# Patient Record
Sex: Female | Born: 1970 | Hispanic: No | Smoking: Former smoker
Health system: Southern US, Community
[De-identification: ages and names within clinical notes are randomized; demographics above are authoritative.]

## PROBLEM LIST (undated history)

## (undated) DIAGNOSIS — G43909 Migraine, unspecified, not intractable, without status migrainosus: Secondary | ICD-10-CM

## (undated) DIAGNOSIS — Q667 Congenital pes cavus, unspecified foot: Secondary | ICD-10-CM

## (undated) DIAGNOSIS — M25511 Pain in right shoulder: Secondary | ICD-10-CM

## (undated) DIAGNOSIS — K219 Gastro-esophageal reflux disease without esophagitis: Secondary | ICD-10-CM

## (undated) DIAGNOSIS — R87619 Unspecified abnormal cytological findings in specimens from cervix uteri: Secondary | ICD-10-CM

## (undated) DIAGNOSIS — T7840XA Allergy, unspecified, initial encounter: Secondary | ICD-10-CM

## (undated) DIAGNOSIS — J302 Other seasonal allergic rhinitis: Secondary | ICD-10-CM

## (undated) DIAGNOSIS — M722 Plantar fascial fibromatosis: Secondary | ICD-10-CM

## (undated) DIAGNOSIS — IMO0002 Reserved for concepts with insufficient information to code with codable children: Secondary | ICD-10-CM

## (undated) DIAGNOSIS — R0602 Shortness of breath: Secondary | ICD-10-CM

## (undated) HISTORY — PX: SACROSPINOUS LIGAMENT FIXATION: SHX2371

## (undated) HISTORY — PX: ABDOMINAL HYSTERECTOMY: SHX81

## (undated) HISTORY — PX: OTHER SURGICAL HISTORY: SHX169

## (undated) HISTORY — DX: Migraine, unspecified, not intractable, without status migrainosus: G43.909

## (undated) HISTORY — PX: DIAGNOSTIC LAPAROSCOPY: SUR761

## (undated) HISTORY — DX: Allergy, unspecified, initial encounter: T78.40XA

## (undated) HISTORY — DX: Unspecified abnormal cytological findings in specimens from cervix uteri: R87.619

## (undated) HISTORY — DX: Reserved for concepts with insufficient information to code with codable children: IMO0002

## (undated) HISTORY — PX: DILATION AND CURETTAGE OF UTERUS: SHX78

---
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
## 1997-11-06 HISTORY — PX: TUBAL LIGATION: SHX77

## 1998-01-02 ENCOUNTER — Inpatient Hospital Stay (HOSPITAL_COMMUNITY): Admission: AD | Admit: 1998-01-02 | Discharge: 1998-01-02 | Payer: Self-pay | Admitting: Obstetrics and Gynecology

## 1998-01-07 ENCOUNTER — Inpatient Hospital Stay (HOSPITAL_COMMUNITY): Admission: AD | Admit: 1998-01-07 | Discharge: 1998-01-07 | Payer: Self-pay | Admitting: Obstetrics and Gynecology

## 1998-01-17 ENCOUNTER — Inpatient Hospital Stay (HOSPITAL_COMMUNITY): Admission: AD | Admit: 1998-01-17 | Discharge: 1998-01-19 | Payer: Self-pay | Admitting: Obstetrics and Gynecology

## 1998-09-28 ENCOUNTER — Encounter: Admission: RE | Admit: 1998-09-28 | Discharge: 1998-09-28 | Payer: Self-pay | Admitting: Family Medicine

## 1998-11-01 ENCOUNTER — Encounter: Admission: RE | Admit: 1998-11-01 | Discharge: 1998-11-01 | Payer: Self-pay | Admitting: Family Medicine

## 1998-12-15 ENCOUNTER — Encounter: Admission: RE | Admit: 1998-12-15 | Discharge: 1998-12-15 | Payer: Self-pay | Admitting: Family Medicine

## 1999-08-17 ENCOUNTER — Encounter: Admission: RE | Admit: 1999-08-17 | Discharge: 1999-08-17 | Payer: Self-pay | Admitting: Family Medicine

## 1999-09-19 ENCOUNTER — Encounter: Admission: RE | Admit: 1999-09-19 | Discharge: 1999-09-19 | Payer: Self-pay | Admitting: Sports Medicine

## 1999-09-20 ENCOUNTER — Encounter: Admission: RE | Admit: 1999-09-20 | Discharge: 1999-09-20 | Payer: Self-pay | Admitting: Family Medicine

## 1999-09-27 ENCOUNTER — Encounter: Admission: RE | Admit: 1999-09-27 | Discharge: 1999-09-27 | Payer: Self-pay | Admitting: Sports Medicine

## 1999-10-05 ENCOUNTER — Encounter: Admission: RE | Admit: 1999-10-05 | Discharge: 1999-10-05 | Payer: Self-pay | Admitting: Family Medicine

## 1999-10-10 ENCOUNTER — Encounter: Admission: RE | Admit: 1999-10-10 | Discharge: 1999-10-10 | Payer: Self-pay | Admitting: Family Medicine

## 1999-10-26 ENCOUNTER — Encounter: Admission: RE | Admit: 1999-10-26 | Discharge: 1999-10-26 | Payer: Self-pay | Admitting: Family Medicine

## 1999-11-23 ENCOUNTER — Encounter: Admission: RE | Admit: 1999-11-23 | Discharge: 1999-11-23 | Payer: Self-pay | Admitting: Family Medicine

## 2000-02-15 ENCOUNTER — Encounter: Admission: RE | Admit: 2000-02-15 | Discharge: 2000-02-15 | Payer: Self-pay | Admitting: Family Medicine

## 2000-06-08 ENCOUNTER — Encounter: Admission: RE | Admit: 2000-06-08 | Discharge: 2000-06-08 | Payer: Self-pay | Admitting: Family Medicine

## 2000-08-06 ENCOUNTER — Encounter: Admission: RE | Admit: 2000-08-06 | Discharge: 2000-08-06 | Payer: Self-pay | Admitting: Family Medicine

## 2000-12-06 ENCOUNTER — Encounter: Admission: RE | Admit: 2000-12-06 | Discharge: 2000-12-06 | Payer: Self-pay | Admitting: Family Medicine

## 2000-12-06 ENCOUNTER — Other Ambulatory Visit: Admission: RE | Admit: 2000-12-06 | Discharge: 2000-12-06 | Payer: Self-pay | Admitting: Legal Medicine

## 2001-01-08 ENCOUNTER — Encounter: Admission: RE | Admit: 2001-01-08 | Discharge: 2001-01-08 | Payer: Self-pay | Admitting: Family Medicine

## 2001-03-19 ENCOUNTER — Encounter: Admission: RE | Admit: 2001-03-19 | Discharge: 2001-03-19 | Payer: Self-pay | Admitting: Family Medicine

## 2001-04-29 ENCOUNTER — Encounter: Admission: RE | Admit: 2001-04-29 | Discharge: 2001-04-29 | Payer: Self-pay | Admitting: Family Medicine

## 2001-05-17 ENCOUNTER — Encounter: Admission: RE | Admit: 2001-05-17 | Discharge: 2001-05-17 | Payer: Self-pay | Admitting: Family Medicine

## 2001-07-19 ENCOUNTER — Encounter: Admission: RE | Admit: 2001-07-19 | Discharge: 2001-07-19 | Payer: Self-pay | Admitting: Family Medicine

## 2001-09-02 ENCOUNTER — Encounter: Admission: RE | Admit: 2001-09-02 | Discharge: 2001-09-02 | Payer: Self-pay | Admitting: Sports Medicine

## 2001-09-16 ENCOUNTER — Encounter: Admission: RE | Admit: 2001-09-16 | Discharge: 2001-09-16 | Payer: Self-pay | Admitting: Family Medicine

## 2001-11-01 ENCOUNTER — Encounter: Admission: RE | Admit: 2001-11-01 | Discharge: 2001-11-01 | Payer: Self-pay | Admitting: Family Medicine

## 2001-12-05 ENCOUNTER — Encounter: Admission: RE | Admit: 2001-12-05 | Discharge: 2001-12-05 | Payer: Self-pay | Admitting: Sports Medicine

## 2002-02-24 ENCOUNTER — Encounter: Admission: RE | Admit: 2002-02-24 | Discharge: 2002-02-24 | Payer: Self-pay | Admitting: Family Medicine

## 2002-03-18 ENCOUNTER — Encounter: Admission: RE | Admit: 2002-03-18 | Discharge: 2002-03-18 | Payer: Self-pay | Admitting: Family Medicine

## 2002-03-18 ENCOUNTER — Other Ambulatory Visit: Admission: RE | Admit: 2002-03-18 | Discharge: 2002-03-18 | Payer: Self-pay | Admitting: Family Medicine

## 2002-03-18 ENCOUNTER — Encounter (INDEPENDENT_AMBULATORY_CARE_PROVIDER_SITE_OTHER): Payer: Self-pay | Admitting: *Deleted

## 2002-08-10 ENCOUNTER — Emergency Department (HOSPITAL_COMMUNITY): Admission: EM | Admit: 2002-08-10 | Discharge: 2002-08-10 | Payer: Self-pay | Admitting: *Deleted

## 2002-08-12 ENCOUNTER — Encounter: Admission: RE | Admit: 2002-08-12 | Discharge: 2002-08-12 | Payer: Self-pay | Admitting: Family Medicine

## 2002-08-14 ENCOUNTER — Encounter: Admission: RE | Admit: 2002-08-14 | Discharge: 2002-08-14 | Payer: Self-pay | Admitting: Family Medicine

## 2002-08-22 ENCOUNTER — Encounter: Admission: RE | Admit: 2002-08-22 | Discharge: 2002-08-22 | Payer: Self-pay | Admitting: Family Medicine

## 2002-12-30 ENCOUNTER — Encounter: Admission: RE | Admit: 2002-12-30 | Discharge: 2002-12-30 | Payer: Self-pay | Admitting: Family Medicine

## 2003-01-21 ENCOUNTER — Encounter: Admission: RE | Admit: 2003-01-21 | Discharge: 2003-01-21 | Payer: Self-pay | Admitting: Family Medicine

## 2003-03-17 ENCOUNTER — Emergency Department (HOSPITAL_COMMUNITY): Admission: EM | Admit: 2003-03-17 | Discharge: 2003-03-17 | Payer: Self-pay | Admitting: Emergency Medicine

## 2003-03-20 ENCOUNTER — Encounter: Admission: RE | Admit: 2003-03-20 | Discharge: 2003-03-20 | Payer: Self-pay | Admitting: Family Medicine

## 2003-10-08 ENCOUNTER — Encounter: Admission: RE | Admit: 2003-10-08 | Discharge: 2003-10-08 | Payer: Self-pay | Admitting: Family Medicine

## 2003-10-29 ENCOUNTER — Encounter: Admission: RE | Admit: 2003-10-29 | Discharge: 2003-10-29 | Payer: Self-pay | Admitting: Sports Medicine

## 2003-12-02 ENCOUNTER — Encounter: Admission: RE | Admit: 2003-12-02 | Discharge: 2003-12-02 | Payer: Self-pay | Admitting: Family Medicine

## 2004-01-03 ENCOUNTER — Emergency Department (HOSPITAL_COMMUNITY): Admission: EM | Admit: 2004-01-03 | Discharge: 2004-01-03 | Payer: Self-pay | Admitting: Emergency Medicine

## 2004-01-06 ENCOUNTER — Encounter: Admission: RE | Admit: 2004-01-06 | Discharge: 2004-01-06 | Payer: Self-pay | Admitting: Family Medicine

## 2004-04-06 ENCOUNTER — Encounter: Admission: RE | Admit: 2004-04-06 | Discharge: 2004-04-06 | Payer: Self-pay | Admitting: Family Medicine

## 2004-05-31 ENCOUNTER — Encounter: Admission: RE | Admit: 2004-05-31 | Discharge: 2004-05-31 | Payer: Self-pay | Admitting: Family Medicine

## 2004-06-07 ENCOUNTER — Encounter: Admission: RE | Admit: 2004-06-07 | Discharge: 2004-06-07 | Payer: Self-pay | Admitting: Sports Medicine

## 2005-01-24 ENCOUNTER — Ambulatory Visit: Payer: Self-pay | Admitting: Sports Medicine

## 2005-06-01 ENCOUNTER — Ambulatory Visit: Payer: Self-pay | Admitting: Family Medicine

## 2005-09-06 ENCOUNTER — Ambulatory Visit: Payer: Self-pay | Admitting: Family Medicine

## 2005-09-18 ENCOUNTER — Ambulatory Visit: Payer: Self-pay | Admitting: Family Medicine

## 2005-10-20 ENCOUNTER — Ambulatory Visit: Payer: Self-pay | Admitting: Family Medicine

## 2006-01-09 ENCOUNTER — Ambulatory Visit: Payer: Self-pay | Admitting: Family Medicine

## 2006-01-19 ENCOUNTER — Ambulatory Visit: Payer: Self-pay | Admitting: Sports Medicine

## 2006-05-06 ENCOUNTER — Encounter (INDEPENDENT_AMBULATORY_CARE_PROVIDER_SITE_OTHER): Payer: Self-pay | Admitting: *Deleted

## 2006-05-06 LAB — CONVERTED CEMR LAB

## 2006-05-29 ENCOUNTER — Other Ambulatory Visit: Admission: RE | Admit: 2006-05-29 | Discharge: 2006-05-29 | Payer: Self-pay | Admitting: Family Medicine

## 2006-05-29 ENCOUNTER — Ambulatory Visit: Payer: Self-pay | Admitting: Sports Medicine

## 2006-06-15 ENCOUNTER — Ambulatory Visit: Payer: Self-pay | Admitting: Family Medicine

## 2006-06-19 ENCOUNTER — Ambulatory Visit: Payer: Self-pay | Admitting: Family Medicine

## 2006-07-04 ENCOUNTER — Ambulatory Visit: Payer: Self-pay | Admitting: Family Medicine

## 2006-07-24 ENCOUNTER — Ambulatory Visit: Payer: Self-pay | Admitting: Family Medicine

## 2006-12-07 ENCOUNTER — Ambulatory Visit: Payer: Self-pay | Admitting: Family Medicine

## 2007-01-03 DIAGNOSIS — E049 Nontoxic goiter, unspecified: Secondary | ICD-10-CM | POA: Insufficient documentation

## 2007-01-03 DIAGNOSIS — J309 Allergic rhinitis, unspecified: Secondary | ICD-10-CM | POA: Insufficient documentation

## 2007-01-03 DIAGNOSIS — A630 Anogenital (venereal) warts: Secondary | ICD-10-CM

## 2007-01-03 DIAGNOSIS — F17200 Nicotine dependence, unspecified, uncomplicated: Secondary | ICD-10-CM | POA: Insufficient documentation

## 2007-01-03 DIAGNOSIS — E669 Obesity, unspecified: Secondary | ICD-10-CM

## 2007-01-03 DIAGNOSIS — F339 Major depressive disorder, recurrent, unspecified: Secondary | ICD-10-CM | POA: Insufficient documentation

## 2007-01-04 ENCOUNTER — Encounter (INDEPENDENT_AMBULATORY_CARE_PROVIDER_SITE_OTHER): Payer: Self-pay | Admitting: *Deleted

## 2007-05-07 ENCOUNTER — Telehealth: Payer: Self-pay | Admitting: *Deleted

## 2007-05-09 ENCOUNTER — Ambulatory Visit: Payer: Self-pay | Admitting: Family Medicine

## 2007-05-09 ENCOUNTER — Encounter (INDEPENDENT_AMBULATORY_CARE_PROVIDER_SITE_OTHER): Payer: Self-pay | Admitting: Family Medicine

## 2007-05-09 LAB — CONVERTED CEMR LAB: Whiff Test: POSITIVE

## 2007-05-13 ENCOUNTER — Encounter (INDEPENDENT_AMBULATORY_CARE_PROVIDER_SITE_OTHER): Payer: Self-pay | Admitting: Family Medicine

## 2007-06-03 ENCOUNTER — Telehealth: Payer: Self-pay | Admitting: *Deleted

## 2007-07-19 ENCOUNTER — Telehealth (INDEPENDENT_AMBULATORY_CARE_PROVIDER_SITE_OTHER): Payer: Self-pay | Admitting: *Deleted

## 2007-07-19 ENCOUNTER — Ambulatory Visit: Payer: Self-pay | Admitting: Family Medicine

## 2007-08-07 ENCOUNTER — Ambulatory Visit: Payer: Self-pay | Admitting: Family Medicine

## 2007-08-07 ENCOUNTER — Encounter (INDEPENDENT_AMBULATORY_CARE_PROVIDER_SITE_OTHER): Payer: Self-pay | Admitting: Family Medicine

## 2007-08-07 DIAGNOSIS — N393 Stress incontinence (female) (male): Secondary | ICD-10-CM | POA: Insufficient documentation

## 2007-08-07 LAB — CONVERTED CEMR LAB
ALT: 9 units/L (ref 0–35)
BUN: 14 mg/dL (ref 6–23)
Blood in Urine, dipstick: NEGATIVE
CO2: 23 meq/L (ref 19–32)
Calcium: 9.2 mg/dL (ref 8.4–10.5)
Chloride: 103 meq/L (ref 96–112)
Creatinine, Ser: 0.79 mg/dL (ref 0.40–1.20)
Free T4: 1.08 ng/dL (ref 0.89–1.80)
Glucose, Bld: 99 mg/dL (ref 70–99)
Nitrite: NEGATIVE
Protein, U semiquant: NEGATIVE
TSH: 2.194 microintl units/mL (ref 0.350–5.50)
Total Bilirubin: 0.4 mg/dL (ref 0.3–1.2)
Urobilinogen, UA: NEGATIVE
WBC Urine, dipstick: NEGATIVE
pH: 6

## 2007-08-08 ENCOUNTER — Encounter (INDEPENDENT_AMBULATORY_CARE_PROVIDER_SITE_OTHER): Payer: Self-pay | Admitting: Family Medicine

## 2007-08-26 ENCOUNTER — Encounter: Payer: Self-pay | Admitting: Family Medicine

## 2007-08-26 ENCOUNTER — Ambulatory Visit: Payer: Self-pay | Admitting: Sports Medicine

## 2007-08-26 LAB — CONVERTED CEMR LAB: Chlamydia, DNA Probe: NEGATIVE

## 2007-09-04 ENCOUNTER — Ambulatory Visit: Payer: Self-pay | Admitting: Family Medicine

## 2007-09-12 ENCOUNTER — Encounter (INDEPENDENT_AMBULATORY_CARE_PROVIDER_SITE_OTHER): Payer: Self-pay | Admitting: *Deleted

## 2007-09-12 ENCOUNTER — Encounter (INDEPENDENT_AMBULATORY_CARE_PROVIDER_SITE_OTHER): Payer: Self-pay | Admitting: Family Medicine

## 2007-09-12 ENCOUNTER — Ambulatory Visit: Payer: Self-pay | Admitting: Family Medicine

## 2007-09-16 ENCOUNTER — Telehealth: Payer: Self-pay | Admitting: *Deleted

## 2007-09-18 LAB — CONVERTED CEMR LAB
BUN: 19 mg/dL (ref 6–23)
Calcium: 9.1 mg/dL (ref 8.4–10.5)
Chloride: 104 meq/L (ref 96–112)
Creatinine, Ser: 0.8 mg/dL (ref 0.40–1.20)

## 2007-09-26 ENCOUNTER — Encounter (INDEPENDENT_AMBULATORY_CARE_PROVIDER_SITE_OTHER): Payer: Self-pay | Admitting: Family Medicine

## 2007-10-21 ENCOUNTER — Ambulatory Visit (HOSPITAL_BASED_OUTPATIENT_CLINIC_OR_DEPARTMENT_OTHER): Admission: RE | Admit: 2007-10-21 | Discharge: 2007-10-21 | Payer: Self-pay | Admitting: Urology

## 2007-11-01 ENCOUNTER — Emergency Department (HOSPITAL_COMMUNITY): Admission: EM | Admit: 2007-11-01 | Discharge: 2007-11-01 | Payer: Self-pay | Admitting: Family Medicine

## 2007-11-01 ENCOUNTER — Telehealth: Payer: Self-pay | Admitting: *Deleted

## 2007-11-07 HISTORY — PX: BLADDER SUSPENSION: SHX72

## 2008-01-07 ENCOUNTER — Ambulatory Visit: Payer: Self-pay | Admitting: Family Medicine

## 2008-01-10 ENCOUNTER — Telehealth: Payer: Self-pay | Admitting: Family Medicine

## 2008-01-10 ENCOUNTER — Emergency Department (HOSPITAL_COMMUNITY): Admission: EM | Admit: 2008-01-10 | Discharge: 2008-01-10 | Payer: Self-pay | Admitting: Family Medicine

## 2008-02-26 ENCOUNTER — Ambulatory Visit: Payer: Self-pay | Admitting: Family Medicine

## 2008-02-26 ENCOUNTER — Encounter (INDEPENDENT_AMBULATORY_CARE_PROVIDER_SITE_OTHER): Payer: Self-pay | Admitting: Family Medicine

## 2008-02-26 LAB — CONVERTED CEMR LAB
Basophils Relative: 0 % (ref 0–1)
Eosinophils Absolute: 0.3 10*3/uL (ref 0.0–0.7)
Lymphs Abs: 3.6 10*3/uL (ref 0.7–4.0)
MCHC: 32.9 g/dL (ref 30.0–36.0)
MCV: 90 fL (ref 78.0–100.0)
Neutro Abs: 6.9 10*3/uL (ref 1.7–7.7)
Neutrophils Relative %: 60 % (ref 43–77)
Platelets: 280 10*3/uL (ref 150–400)
TSH: 1.71 microintl units/mL (ref 0.350–5.50)
Uric Acid, Serum: 5.8 mg/dL (ref 2.4–7.0)
WBC: 11.6 10*3/uL — ABNORMAL HIGH (ref 4.0–10.5)

## 2008-03-09 ENCOUNTER — Ambulatory Visit: Payer: Self-pay | Admitting: Sports Medicine

## 2008-03-09 ENCOUNTER — Telehealth: Payer: Self-pay | Admitting: *Deleted

## 2008-03-24 ENCOUNTER — Ambulatory Visit: Payer: Self-pay | Admitting: Family Medicine

## 2008-03-28 ENCOUNTER — Emergency Department (HOSPITAL_COMMUNITY): Admission: EM | Admit: 2008-03-28 | Discharge: 2008-03-28 | Payer: Self-pay | Admitting: Family Medicine

## 2008-08-07 ENCOUNTER — Encounter: Payer: Self-pay | Admitting: Family Medicine

## 2008-08-07 ENCOUNTER — Ambulatory Visit: Payer: Self-pay | Admitting: Family Medicine

## 2008-08-12 ENCOUNTER — Encounter: Payer: Self-pay | Admitting: Family Medicine

## 2008-08-12 LAB — CONVERTED CEMR LAB: Pap Smear: NORMAL

## 2008-09-28 ENCOUNTER — Telehealth: Payer: Self-pay | Admitting: *Deleted

## 2008-09-30 ENCOUNTER — Ambulatory Visit: Payer: Self-pay | Admitting: Family Medicine

## 2009-01-11 ENCOUNTER — Ambulatory Visit: Payer: Self-pay | Admitting: Family Medicine

## 2009-01-24 ENCOUNTER — Emergency Department (HOSPITAL_COMMUNITY): Admission: EM | Admit: 2009-01-24 | Discharge: 2009-01-24 | Payer: Self-pay | Admitting: Emergency Medicine

## 2009-01-26 ENCOUNTER — Ambulatory Visit: Payer: Self-pay | Admitting: Family Medicine

## 2009-02-05 ENCOUNTER — Ambulatory Visit: Payer: Self-pay | Admitting: Family Medicine

## 2009-02-05 ENCOUNTER — Telehealth: Payer: Self-pay | Admitting: Family Medicine

## 2009-06-07 ENCOUNTER — Telehealth (INDEPENDENT_AMBULATORY_CARE_PROVIDER_SITE_OTHER): Payer: Self-pay | Admitting: *Deleted

## 2009-07-07 ENCOUNTER — Telehealth: Payer: Self-pay | Admitting: *Deleted

## 2009-07-14 ENCOUNTER — Ambulatory Visit: Payer: Self-pay | Admitting: Family Medicine

## 2009-07-15 ENCOUNTER — Telehealth: Payer: Self-pay | Admitting: Family Medicine

## 2009-07-16 ENCOUNTER — Encounter: Payer: Self-pay | Admitting: Family Medicine

## 2009-07-23 ENCOUNTER — Telehealth: Payer: Self-pay | Admitting: Family Medicine

## 2009-09-25 ENCOUNTER — Emergency Department (HOSPITAL_COMMUNITY): Admission: EM | Admit: 2009-09-25 | Discharge: 2009-09-26 | Payer: Self-pay | Admitting: Emergency Medicine

## 2009-10-06 ENCOUNTER — Encounter: Payer: Self-pay | Admitting: Family Medicine

## 2009-10-06 ENCOUNTER — Ambulatory Visit: Payer: Self-pay | Admitting: Family Medicine

## 2009-10-06 LAB — CONVERTED CEMR LAB
BUN: 17 mg/dL (ref 6–23)
Creatinine, Ser: 0.81 mg/dL (ref 0.40–1.20)
Glucose, Bld: 95 mg/dL (ref 70–99)
Hemoglobin: 13.4 g/dL (ref 12.0–15.0)
MCHC: 33.1 g/dL (ref 30.0–36.0)
MCV: 89.4 fL (ref 78.0–100.0)
RBC: 4.53 M/uL (ref 3.87–5.11)

## 2009-10-07 ENCOUNTER — Telehealth: Payer: Self-pay | Admitting: Family Medicine

## 2009-10-07 ENCOUNTER — Ambulatory Visit: Payer: Self-pay | Admitting: Family Medicine

## 2009-12-31 ENCOUNTER — Ambulatory Visit: Payer: Self-pay | Admitting: Family Medicine

## 2009-12-31 ENCOUNTER — Encounter: Payer: Self-pay | Admitting: Family Medicine

## 2009-12-31 ENCOUNTER — Telehealth: Payer: Self-pay | Admitting: Family Medicine

## 2010-01-03 ENCOUNTER — Ambulatory Visit: Payer: Self-pay | Admitting: Family Medicine

## 2010-01-03 ENCOUNTER — Encounter: Payer: Self-pay | Admitting: Family Medicine

## 2010-01-03 DIAGNOSIS — N814 Uterovaginal prolapse, unspecified: Secondary | ICD-10-CM | POA: Insufficient documentation

## 2010-02-09 ENCOUNTER — Ambulatory Visit: Payer: Self-pay | Admitting: Obstetrics & Gynecology

## 2010-04-15 ENCOUNTER — Encounter: Payer: Self-pay | Admitting: Family Medicine

## 2010-04-15 ENCOUNTER — Ambulatory Visit: Payer: Self-pay | Admitting: Family Medicine

## 2010-04-15 LAB — CONVERTED CEMR LAB
Chlamydia, DNA Probe: NEGATIVE
GC Probe Amp, Genital: NEGATIVE
Whiff Test: POSITIVE

## 2010-05-25 ENCOUNTER — Telehealth: Payer: Self-pay | Admitting: *Deleted

## 2010-08-22 ENCOUNTER — Ambulatory Visit: Payer: Self-pay | Admitting: Family Medicine

## 2010-08-22 ENCOUNTER — Encounter: Payer: Self-pay | Admitting: Family Medicine

## 2010-08-22 LAB — CONVERTED CEMR LAB
Chlamydia, DNA Probe: NEGATIVE
GC Probe Amp, Genital: NEGATIVE
Glucose, Urine, Semiquant: NEGATIVE
Nitrite: NEGATIVE
Protein, U semiquant: NEGATIVE
Specific Gravity, Urine: 1.015
WBC Urine, dipstick: NEGATIVE

## 2010-08-23 ENCOUNTER — Encounter: Payer: Self-pay | Admitting: Family Medicine

## 2010-09-06 ENCOUNTER — Encounter: Payer: Self-pay | Admitting: Family Medicine

## 2010-09-07 ENCOUNTER — Encounter: Payer: Self-pay | Admitting: *Deleted

## 2010-09-28 ENCOUNTER — Ambulatory Visit: Payer: Self-pay | Admitting: Obstetrics & Gynecology

## 2010-10-20 ENCOUNTER — Encounter: Payer: Self-pay | Admitting: Family Medicine

## 2010-10-21 ENCOUNTER — Telehealth: Payer: Self-pay | Admitting: Family Medicine

## 2010-10-23 ENCOUNTER — Encounter: Payer: Self-pay | Admitting: Family Medicine

## 2010-10-23 ENCOUNTER — Emergency Department (HOSPITAL_COMMUNITY)
Admission: EM | Admit: 2010-10-23 | Discharge: 2010-10-23 | Payer: Self-pay | Source: Home / Self Care | Admitting: Emergency Medicine

## 2010-10-24 ENCOUNTER — Telehealth: Payer: Self-pay | Admitting: Family Medicine

## 2010-10-27 ENCOUNTER — Ambulatory Visit: Payer: Self-pay

## 2010-10-27 ENCOUNTER — Ambulatory Visit: Payer: Self-pay | Admitting: Family Medicine

## 2010-10-27 DIAGNOSIS — R300 Dysuria: Secondary | ICD-10-CM

## 2010-10-27 DIAGNOSIS — F411 Generalized anxiety disorder: Secondary | ICD-10-CM

## 2010-10-27 LAB — CONVERTED CEMR LAB
Bilirubin Urine: NEGATIVE
Glucose, Urine, Semiquant: NEGATIVE
Ketones, urine, test strip: NEGATIVE
Protein, U semiquant: 100
Urobilinogen, UA: 0.2

## 2010-11-08 ENCOUNTER — Ambulatory Visit: Admit: 2010-11-08 | Payer: Self-pay

## 2010-11-17 ENCOUNTER — Encounter: Payer: Self-pay | Admitting: *Deleted

## 2010-11-17 ENCOUNTER — Encounter: Payer: Self-pay | Admitting: Sports Medicine

## 2010-11-17 ENCOUNTER — Ambulatory Visit: Admission: RE | Admit: 2010-11-17 | Discharge: 2010-11-17 | Payer: Self-pay | Source: Home / Self Care

## 2010-11-17 DIAGNOSIS — Q667 Congenital pes cavus, unspecified foot: Secondary | ICD-10-CM | POA: Insufficient documentation

## 2010-11-17 DIAGNOSIS — M722 Plantar fascial fibromatosis: Secondary | ICD-10-CM | POA: Insufficient documentation

## 2010-11-17 HISTORY — DX: Congenital pes cavus, unspecified foot: Q66.70

## 2010-11-24 ENCOUNTER — Telehealth: Payer: Self-pay | Admitting: *Deleted

## 2010-11-28 ENCOUNTER — Ambulatory Visit: Admission: RE | Admit: 2010-11-28 | Discharge: 2010-11-28 | Payer: Self-pay | Source: Home / Self Care

## 2010-12-08 NOTE — Letter (Signed)
Summary: Out of Work  [facility]  [address]   [place], [place] [address]   Phone: [phone]  Fax: [phone]    September 07, 2010   Employee:  Deanna Scott [facility] [employer] [facility]    To Whom It May Concern:   For Medical reasons, please excuse the above named employee from work for the following dates:  September 07, 2010  If you need additional information, please feel free to contact our office.         Sincerely,    Jimmy Footman, CMA for Matthew Saras, MD

## 2010-12-08 NOTE — Letter (Signed)
Summary: *Referral Letter  [facility]  [address]   [place], [place] [address]   Phone: [phone]  Fax: [phone]    01/03/2010  Thank you in advance for agreeing to see my patient:  Deanna Scott 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  smoker.   3)  stress incontinence 4)  hx of hemrrhoids 5)  obese 6)  allergies       Pertinent Labs: none   Thank you again for agreeing to see our patient; please contact us if you have any further questions or need additional information.  Sincerely,  Luretha Murphy NP

## 2010-12-08 NOTE — Letter (Signed)
Summary: Out of Work  [facility]  [address]   [place], [place] [address]   Phone: [phone]  Fax: [phone]    December 31, 2009   Employee:  DALLANA MAVITY [facility]    To Whom It May Concern:   Ms. Stinson was seen today in our office for a medical problem.  I have advised her that she is not to work from today through Monday, February 28th when she has a follow-up appointment here.    If you need additional information, please feel free to contact our office.         Sincerely,    Paula Compton MD

## 2010-12-08 NOTE — Progress Notes (Signed)
Summary: re: metronidizole/ts  Phone Note Call from Patient Call back at Home Phone [phone]   Caller: Patient Summary of Call: wants to know if she can stop taking metronadizole pls leave message at home, she cannot take calls at work Initial call taken by: De Nurse,  November 24, 2010 8:50 AM  Follow-up for Phone Call        called pt and lmvm 'do not see that metronidizole was rx'd at the ov. but, if rx'd needs to finish 7 day supply in order to get rid of signs's'. call back, if more ????' Follow-up by: Arlyss Repress CMA,,  November 24, 2010 11:22 AM

## 2010-12-08 NOTE — Consult Note (Signed)
Summary: UC visit & labs  UC visit & labs   Imported By: De Nurse 10/27/2010 16:58:40  _____________________________________________________________________  External Attachment:    Type:   Image     Comment:   External Document

## 2010-12-08 NOTE — Assessment & Plan Note (Signed)
Summary: fu infected finger/kh/ no blue team dr avail   Vital Signs:  Patient profile:   40 year old female Height:      63.75 inches Weight:      207 pounds BMI:     35.94 BSA:     1.98 Temp:     98.7 degrees F Pulse rate:   98 / minute BP sitting:   127 / 82  Vitals Entered By: Jone Baseman CMA (January 03, 2010 3:45 PM) CC: f/u infected finger Is Patient Diabetic? No Pain Assessment Patient in pain? no        Primary Care Provider:  Romero Belling MD  CC:  f/u infected finger.  History of Present Illness: Follow up after I&D of cellulitis and abcess of right middle finger.  Has been on Bactrim DS, 2 tabs two times a day for 2 weeks.  She has not been able to work.  She has kept it dry and clean.  Complains of a firm object hanging from her vaginal, she was seen and evalauated for this and had a pelvic in 09.  She is frightened.  She told her mother and her mother told her she had it the same problem and it was her uterus.  Habits & Providers  Alcohol-Tobacco-Diet     Tobacco Status: current     Tobacco Counseling: to quit use of tobacco products     Cigarette Packs/Day: 1.0  Current Medications (verified): 1)  Bactrim Ds 800-160 Mg Tabs (Sulfamethoxazole-Trimethoprim) .... Sig: Take 2 Tabs By Mouth Two Times A Day For 10 Days  Allergies (verified): No Known Drug Allergies  Review of Systems      See HPI  Physical Exam  General:  alert and well-developed.   Genitalia:  examined standing up, cervix poking through the vaginal os Skin:  right third finger with red area, non indurated, non fluctuant, small core in center with scabbing.   Impression & Recommendations:  Problem # 1:  UTERINE PROLAPSE W/O MENTION VAG WALL PROLAPSE (ICD-618.1) Refer to GYN for treatment Orders: Gynecologic Referral (Gyn) [facility]- Est Level  3 ([address]78)  Problem # 2:  UNSPECIFIED CELLULITIS AND ABSCESS OF FINGER (ICD-681.00)  improving, may begin to get wet, go back to  work. Her updated medication list for this problem includes:    Bactrim Ds 800-160 Mg Tabs (Sulfamethoxazole-trimethoprim) ..... Sig: take 2 tabs by mouth two times a day for 10 days  Orders: [facility]- Est Level  3 ([address]562)  Complete Medication List: 1)  Bactrim Ds 800-160 Mg Tabs (Sulfamethoxazole-trimethoprim) .... Sig: take 2 tabs by mouth two times a day for 10 days

## 2010-12-08 NOTE — Assessment & Plan Note (Signed)
Summary: female problem,tcb   Vital Signs:  Patient profile:   40 year old female Weight:      215 pounds Pulse rate:   79 / minute BP sitting:   132 / 82  (right arm) Cuff size:   regular  Vitals Entered By: Arlyss Repress CMA, (August 22, 2010 10:23 AM) CC: pt states 'my uterus fell'. c/o bad smelly urine and lower pelvic pain x 1 month. pt is sexually active. does not use condoms. Is Patient Diabetic? No Pain Assessment Patient in pain? no        Primary Care Provider:  Romero Belling MD  CC:  pt states 'my uterus fell'. c/o bad smelly urine and lower pelvic pain x 1 month. pt is sexually active. does not use condoms..  History of Present Illness: Female problems, mostly bad odor when voiding.  No dysuria.  Recent new sexual partner and has noticed discharge.  She is worried about her uterine prolapse, feels that it is causing pain.  She did not attend the follow up apt as directed.  She would like to have her uterus out.  Past bladder tacking by urology.  Current Medications (verified): 1)  Metronidazole 500 Mg Tabs (Metronidazole) .... One Tab Two Times A Day For 7 Days  Allergies (verified): No Known Drug Allergies  Review of Systems      See HPI General:  Denies fever. GU:  Complains of discharge; denies dysuria, urinary frequency, and urinary hesitancy; odor to urine.  Physical Exam  General:  Well-developed,well-nourished,in no acute distress; alert,appropriate and cooperative throughout examination Genitalia:  Normal introitus for age, no external lesion, grade 1-2 uterine prolapse, + wiff,mucosa pink and moist, no vaginal or cervical tag, no vaginal atrophy, no friaility or hemorrhage, normal uterus size and position, no adnexal masses or tenderness  + clues and wiff on wet prep   Impression & Recommendations:  Problem # 1:  VAGINITIS, BACTERIAL (ICD-616.10) suspect odor is from vaginal discharge and not in urine, normal UA and micro The following  medications were removed from the medication list:    Metronidazole 500 Mg Tabs (Metronidazole) .Marland KitchenMarland KitchenMarland KitchenMarland Kitchen 4 tabs times one Her updated medication list for this problem includes:    Metronidazole 500 Mg Tabs (Metronidazole) ..... One tab two times a day for 7 days  Orders: [facility]- Est Level  3 (81191)  Problem # 2:  UTERINE PROLAPSE W/O MENTION VAG WALL PROLAPSE (ICD-618.1) Recommneded returing for follow up with GYN clinic to discuss options, recommended [place] exercises often.  Complete Medication List: 1)  Metronidazole 500 Mg Tabs (Metronidazole) .... One tab two times a day for 7 days  Other Orders: Urinalysis-[facility] (00000) GC/Chlamydia-[facility] (87591/87491) [phone] Prescriptions: METRONIDAZOLE 500 MG TABS (METRONIDAZOLE) one tab two times a day for 7 days  #14 x 0   Entered and Authorized by:   Luretha Murphy NP   Signed by:   Luretha Murphy NP on 08/22/2010   Method used:   Print then Give to Patient   RxID:   [id number]    Orders Added: 1)  Urinalysis-[facility] [00000] 2)  GC/Chlamydia-[facility] [87591/87491] 3)  Wet Prep- [facility] [95284] 4)  [facility]- Est Level  3 [13244]    Laboratory Results   Urine Tests  Date/Time Received: August 22, 2010 10:29 AM  Date/Time Reported: August 22, 2010 10:44 AM   Routine Urinalysis   Color: yellow Appearance: Clear Glucose: negative   (Normal Range: Negative) Bilirubin: negative   (Normal Range: Negative) Ketone: negative   (  Normal Range: Negative) Spec. Gravity: 1.015   (Normal Range: 1.003-1.035) Blood: moderate   (Normal Range: Negative) pH: 5.5   (Normal Range: 5.0-8.0) Protein: negative   (Normal Range: Negative) Urobilinogen: 0.2   (Normal Range: 0-1) Nitrite: negative   (Normal Range: Negative) Leukocyte Esterace: negative   (Normal Range: Negative)  Urine Microscopic WBC/HPF: occ RBC/HPF: 0-3 Bacteria/HPF: 3+ Epithelial/HPF: 1-5    Comments: ...........test performed by...........Marland KitchenTerese Door, CMA   Date/Time  Received: August 22, 2010 11:18 AM  Date/Time Reported: August 22, 2010 11:23 AM   [place] Source: vaginal WBC/hpf: rare Bacteria/hpf: 3+  Cocci Clue cells/hpf: few  Positive whiff Yeast/hpf: none Trichomonas/hpf: none Comments: ...........test performed by...........Marland KitchenTerese Door, CMA

## 2010-12-08 NOTE — Assessment & Plan Note (Signed)
Summary: finger swollen,red w/pus/[place]   Vital Signs:  Patient profile:   40 year old female Weight:      210.6 pounds BMI:     36.57 Temp:     97.5 degrees F Pulse rate:   89 / minute BP sitting:   130 / 87  Vitals Entered By: Starleen Blue RN (December 31, 2009 3:58 PM) CC: finger red swollen Is Patient Diabetic? No Pain Assessment Patient in pain? no        Primary Care Provider:  Romero Belling MD  CC:  finger red swollen.  History of Present Illness: Patient here for complaint of Right middle finger soreness and redness that began yesterday evening.  Has extended since then, has begun to exude a small amount of pus.  Patientworks in food preparation, also has hands in dishwater a lot.   No recollection of trauma or insect bite.  Never has had this before.   Denies history of fevers or chills.   Habits & Providers  Alcohol-Tobacco-Diet     Tobacco Status: current     Cigarette Packs/Day: 1.0  Current Medications (verified): 1)  Bactrim Ds 800-160 Mg Tabs (Sulfamethoxazole-Trimethoprim) .... Sig: Take 2 Tabs By Mouth Two Times A Day For 10 Days  Allergies (verified): No Known Drug Allergies  Physical Exam  General:  well appearing, no apparent distress.  Msk:  RIGHT hand with circular area of erythema wiht central fluctuance with purulence, measurement of affected area is 1.5cm squared, along dorsum of proximal phalanx of RIGHT hand.  Additional Exam:  Procedure Note;  After discussing risks and benefits of I&D and providing opportunity to ask questions, written consent obtained from patient.  Sterile prep performed, and 1% lidocaine without epinephrine infused to raise a wheal.  After adequate anesthesia assured, a 0.5cm straight incision performed in center of erythematous area, with expression of small amt of purulence. Area cleaned, without loculations noted. Triple antibiotic applied, dressed. Tolerated well.    Impression & Recommendations:  Problem #  1:  UNSPECIFIED CELLULITIS AND ABSCESS OF FINGER (ICD-681.00)  RIGHT middle finger abscess and cellulitis, without suspicion of deep/bony involvement by exam.  I&D performed today, also started on BACTRIM for suspected CA-MRSA.  Wound cultures not indicated.  For follow up on MOnday. 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  THerefore, note out of work until she can have follow up on MOnday.  She is agreeable to this.  Her updated medication list for this problem includes:    Bactrim Ds 800-160 Mg Tabs (Sulfamethoxazole-trimethoprim) ..... Sig: take 2 tabs by mouth two times a day for 10 days  Orders: [facility]- Est Level  3 ([address]109) I&D Abcess, simple- [facility] (10060)  Complete Medication List: 1)  Bactrim Ds 800-160 Mg Tabs (Sulfamethoxazole-trimethoprim) .... Sig: take 2 tabs by mouth two times a day for 10 days  Patient Instructions: 1)  It was a pleasure to see you today.  I drained the abscess on your right middle finger today.   2)  I sent a prescription for Bactrim DS tablets to [facility] [address]  Take 2 tablets by mouth two times a day for 10 days.  3)  I WANT YOU TO BE SEEN IN THIS OFFICE ON MONDAY FEB 28th AT ANY TIME THAT WORKS FOR YOU>  PLEASE MAKE AN APPOINTMENT WITH THE FRONT DESK TODAY. Prescriptions: BACTRIM DS 800-160 MG TABS (SULFAMETHOXAZOLE-TRIMETHOPRIM) SIG: Take 2 tabs by mouth two times a day for 10 days  #  40 x 0   Entered and Authorized by:   Paula Compton MD   Signed by:   Paula Compton MD on 12/31/2009   Method used:   Electronically to        [facility]. [phone]* (retail)       [address]       [place], [place]  [address]       Ph: [phone]       Fax: [phone]   RxID:   [id number]

## 2010-12-08 NOTE — Assessment & Plan Note (Addendum)
Summary: foot prob,df   Vital Signs:  Patient profile:   40 year old female Height:      63.75 inches Weight:      220 pounds Temp:     98 degrees F oral Pulse rate:   100 / minute BP sitting:   124 / 84  (left arm) Cuff size:   large  Vitals Entered By: Loralee Pacas CMA (November 17, 2010 3:49 PM) CC: bilateral foot pain Is Patient Diabetic? No Pain Assessment Patient in pain? yes     Location: foot Intensity: 9 Type: heaviness Onset of pain  Constant Comments pt states that both of her arches have fallen and she was given some orthotics to use in her shoes and the pain has not gotten any better. she has a job the requires alot of standing and walking. pt would like a podiatry referral   Primary Care Provider:  Luretha Murphy NP  CC:  bilateral foot pain.  History of Present Illness: 40 yo female with bilateral foot pain.  Pt notes pain is worse in the morning, often with the first step of the day.  Located just at the front of the plantar aspect of the calcaneus.  She has had heel cups rx'ed in the past but did not wear them.  She uses only ibuprofen 400mg  without any improvement.  She bought some OTC orthotics that do not appear to offer any kind of support.  Pain radiates from calcaneus to metatarsal heads.  She is quite miserable as she stands all day at work.  R foot much worse than the left.  Habits & Providers  Alcohol-Tobacco-Diet     Tobacco Status: current     Tobacco Counseling: to quit use of tobacco products     Cigarette Packs/Day: 1.0     Year Started: 1984     Pack years: 37.50  Current Medications (verified): 1)  Metronidazole 500 Mg Tabs (Metronidazole) .... One Tab Two Times A Day For 7 Days 2)  Famotidine 40 Mg Tabs (Famotidine) .... One Tab Two Times A Day As Needed Hives 3)  Loratadine Allergy Relief 10 Mg Tbdp (Loratadine) .... One Daily 4)  Prednisone 20 Mg Tabs (Prednisone) .... One Daily For 5 Day 5)  Azithromycin 250 Mg Tabs (Azithromycin)  .... 2 Tabs By Mouth Daily For The Next 3 Days 6)  Hydroxyzine Hcl 25 Mg Tabs (Hydroxyzine Hcl) .Marland Kitchen.. 1 Tab By Mouth Three Times A Day As Needed For Hives, Itching or Anxiety 7)  Mobic 15 Mg Tabs (Meloxicam) .... One Tab By Mouth Daily For Pain  Allergies (verified): No Known Drug Allergies  Review of Systems       See HPI  Physical Exam  General:  Well-developed,well-nourished,in no acute distress; alert,appropriate and cooperative throughout examination Msk:  She has bilateral pes cavus with extremely high longitudinal arches.  Her transverse arches are somewhat deteriorated with clawing of her toes.  No abnormal callus. She is very TTP at the origin of the plantar fascia b/l. ROM is full with neg kleiger and ant drawer tests. Strength is 5/5 to all movements. No hallux rigidus.   Additional Exam:  MSK [place] with moderate thickening of the plantar fascia on the R foot, no increased doppler signal. Image saved.   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 Heel cups rxed. Pt declines cortisone injection of the plantar fascia today. Mobic for pain. SM advisor  rehab exercises given. RTC if no better in 2 weeks.  Her updated medication list for this problem includes:    Mobic 15 Mg Tabs (Meloxicam) ..... One tab by mouth daily for pain  Orders: [facility]- Est  Level 4 (99214) [place] LIMITED (13086) Heel Cushions-[facility] (V7846)  Problem # 2:  TALIPES CAVUS (ICD-754.71) Assessment: New Present, did not assess gait today but do not think this is contributing to her symptoms.  Complete Medication List: 1)  Metronidazole 500 Mg Tabs (Metronidazole) .... One tab two times a day for 7 days 2)  Famotidine 40 Mg Tabs (Famotidine) .... One tab two times a day as needed hives 3)  Loratadine Allergy Relief 10 Mg Tbdp (Loratadine) .... One daily 4)  Prednisone 20 Mg Tabs (Prednisone) ....  One daily for 5 day 5)  Azithromycin 250 Mg Tabs (Azithromycin) .... 2 tabs by mouth daily for the next 3 days 6)  Hydroxyzine Hcl 25 Mg Tabs (Hydroxyzine hcl) .Marland Kitchen.. 1 tab by mouth three times a day as needed for hives, itching or anxiety 7)  Mobic 15 Mg Tabs (Meloxicam) .... One tab by mouth daily for pain Prescriptions: MOBIC 15 MG TABS (MELOXICAM) One tab by mouth daily for pain  #30 x 3   Entered and Authorized by:   Rodney Langton MD   Signed by:   Rodney Langton MD on 11/17/2010   Method used:   Print then Give to Patient   RxID:   [id number]    Orders Added: 1)  [facility]- Est  Level 4 [27253] 2)  [place] LIMITED [66440] 3)  Heel Cushions-[facility] [L3332]

## 2010-12-08 NOTE — Progress Notes (Signed)
Summary: phn msg  Phone Note Call from Patient Call back at [facility] Phone [phone]   Caller: Patient Summary of Call: was taking allergy meds and prednisone for her hives- went to [facility] care yesterday and they increased her prednisone and feels much better.  wants to know if she can stop taking allergy meds as they are messing with her BP. Initial call taken by: De Nurse,  October 24, 2010 8:33 AM  Follow-up for Phone Call        Ms. Mcneil is still wating for phone call regarding her concern about her meds and a rx called in for her daughter  Jeris Penta Follow-up by: Abundio Miu,  October 24, 2010 10:42 AM  Additional Follow-up for Phone Call Additional follow up Details #1::        will fwd. to s.saxon to address. Additional Follow-up by: Arlyss Repress CMA,,  October 24, 2010 12:08 PM    Additional Follow-up for Phone Call Additional follow up Details #2::    sure Follow-up by: Luretha Murphy NP,  October 24, 2010 1:40 PM

## 2010-12-08 NOTE — Progress Notes (Signed)
Summary: triage  Phone Note Call from Patient Call back at [phone]   Caller: Patient Summary of Call: Pt has something on finger that maybe a bite finger swollen now moving into hand.  Hand is red and looks like yellow stuff inside of area. Initial call taken by: Clydell Hakim,  December 31, 2009 10:03 AM  Follow-up for Phone Call        she noticed it yesterday. does not know if she was bitten. unable to come until after work. appt at 4 with Dr. Mauricio Po Follow-up by: Golden Circle RN,  December 31, 2009 10:30 AM

## 2010-12-08 NOTE — Progress Notes (Signed)
Summary: phn msg  Phone Note Call from Patient Call back at Home Phone [phone]   Caller: Patient Summary of Call: still breaking out and wants to know what to do. Initial call taken by: De Nurse,  October 21, 2010 8:43 AM  Follow-up for Phone Call        Was in clinic yesteday, seen by Luretha Murphy who wrote her a script.  Darl Pikes is in clinic this am so will route note to her. Follow-up by: Dennison Nancy RN,  October 21, 2010 8:52 AM  Additional Follow-up for Phone Call Additional follow up Details #1::        Will add short course of prednisone for the urticaria, she should continue the H1 and H2 blocker. She is getting congested may be related to viral reaction. Additional Follow-up by: Luretha Murphy NP,  October 21, 2010 9:07 AM    New/Updated Medications: PREDNISONE 20 MG TABS (PREDNISONE) one daily for 5 day Prescriptions: PREDNISONE 20 MG TABS (PREDNISONE) one daily for 5 day  #5 x 0   Entered and Authorized by:   Luretha Murphy NP   Signed by:   Luretha Murphy NP on 10/21/2010   Method used:   Electronically to        [facility]. [phone]* (retail)       [address]       [place], [place]  [address]       Ph: [phone]       Fax: [phone]   RxID:   [id number]

## 2010-12-08 NOTE — Assessment & Plan Note (Signed)
Summary: f/u UC,df   Vital Signs:  Patient profile:   40 year old female Weight:      215 pounds Temp:     97.5 degrees F oral Pulse rate:   67 / minute Pulse rhythm:   regular BP sitting:   142 / 98  (left arm)  Vitals Entered By: Loralee Pacas CMA (October 27, 2010 12:09 PM) Comments sob, lightheaded, weak   Primary Care Provider:  Romero Belling MD   History of Present Illness: 40 yo female who was seen by [facility] for hives and urticaria, pt was on prednisone at the time had increase dose and it helped.  Pt though now states she is having problems breathing for the last 2 days and has stopped all medications. Pt denies fever, chills, nausea, vomiting, diarrhea or constipation but is not feeling right.  Pt daughter accompanies her and states had walking pna last year and it started the same way.  pt does smoke.  pt denies much of a cough and not productive. Pt daughter also states pt is a very anxious person and seems wound very tight but denies any depressive symptoms. Lots of stress during the holidays.   Current Medications (verified): 1)  Metronidazole 500 Mg Tabs (Metronidazole) .... One Tab Two Times A Day For 7 Days 2)  Famotidine 40 Mg Tabs (Famotidine) .... One Tab Two Times A Day As Needed Hives 3)  Loratadine Allergy Relief 10 Mg Tbdp (Loratadine) .... One Daily 4)  Prednisone 20 Mg Tabs (Prednisone) .... One Daily For 5 Day 5)  Azithromycin 250 Mg Tabs (Azithromycin) .... 2 Tabs By Mouth Daily For The Next 3 Days 6)  Hydroxyzine Hcl 25 Mg Tabs (Hydroxyzine Hcl) .Marland Kitchen.. 1 Tab By Mouth Three Times A Day As Needed For Hives, Itching or Anxiety  Allergies (verified): No Known Drug Allergies  Past History:  Past medical, surgical, family and social histories (including risk factors) reviewed, and no changes noted (except as noted below).  Past Medical History: Reviewed history from 02/26/2008 and no changes required. ANA (-), RF (-), ACE (-), RAST (-) 08/2002, h/o abnormal  pap  1 ppd.  smoker.   stress incontinence hx of hemrrhoids obese allergies    Past Surgical History: Reviewed history from 02/26/2008 and no changes required. 5/95 cervical biopsy:  slight dysplasia -,  exploratory laparoscopy 1990 -,  Tubal ligation 1999 -   PMH-FH-SH reviewed for relevance  Family History: Reviewed history from 02/26/2008 and no changes required. father with alcoholsim- died when pt was 1.  sister and mother have HTN,  grandmother maternal- DM, HTN     Social History: Reviewed history from 02/26/2008 and no changes required. two children, works at [employer].; smokes one ppd, drinks etoh, occasionally.      Review of Systems       see hpi  Physical Exam  General:  Well-developed,well-nourished,in no acute distress; alert,appropriate and cooperative throughout examination Eyes:  EOMI, PERRLA Nose:  erythema turbinates  Mouth:  mild PND mild erythema no enlarge gland Lungs:  coarse breath sounds no weezing or focal findings good air movement overall.  Heart:  Normal rate and regular rhythm. S1 and S2 normal without gallop, murmur, click, rub or other extra sounds. Abdomen:   BS, overweight.  Pulses:  2+ Extremities:  no true rash lots of exfoliation from self itching.  Neurologic:  alert & oriented X3 and gait normal.     Impression & Recommendations:  Problem # 1:  ACUTE BRONCHITIS (ICD-466.0)  Assessment New Pt does have some coarse breath sounds and pt is very concern could be early pna and has finding of possible early sinus infection.  Will treat with azithro for 3 days and see how pt does do feel pt does have anxiety component to problems so will give hydroxizine as needed as well to see how pt does.  Told pt to stop other antihistamines if taking it normal.  Her updated medication list for this problem includes:    Metronidazole 500 Mg Tabs (Metronidazole) ..... One tab two times a day for 7 days    Azithromycin 250 Mg Tabs (Azithromycin) .Marland Kitchen... 2  tabs by mouth daily for the next 3 days  Orders: [facility]- Est  Level 4 (82956)  Problem # 2:  DEPRESSION, MAJOR, RECURRENT (ICD-296.30) no depression at this time should do screening at next visit.  Problem # 3:  ANXIETY DISORDER, GENERALIZED (ICD-300.02)  Her updated medication list for this problem includes:    Hydroxyzine Hcl 25 Mg Tabs (Hydroxyzine hcl) .Marland Kitchen... 1 tab by mouth three times a day as needed for hives, itching or anxiety  Complete Medication List: 1)  Metronidazole 500 Mg Tabs (Metronidazole) .... One tab two times a day for 7 days 2)  Famotidine 40 Mg Tabs (Famotidine) .... One tab two times a day as needed hives 3)  Loratadine Allergy Relief 10 Mg Tbdp (Loratadine) .... One daily 4)  Prednisone 20 Mg Tabs (Prednisone) .... One daily for 5 day 5)  Azithromycin 250 Mg Tabs (Azithromycin) .... 2 tabs by mouth daily for the next 3 days 6)  Hydroxyzine Hcl 25 Mg Tabs (Hydroxyzine hcl) .Marland Kitchen.. 1 tab by mouth three times a day as needed for hives, itching or anxiety  Other Orders: Urinalysis-[facility] (00000)  Patient Instructions: 1)  I am giving you two new medications 2)  one called azithromycin, a antibitoic, you will take 1 pill daily for the next three days bbut this medicine will work for 2 weeks 3)  I will give you another medication called hydroxizine that will help you with any allerigc reaction such as your hives.  this medicine may make you a little sleepy so take it firs when you are at home.  You can take this medicine up to three times a day  4)  I want you to come back next week and follow up with Darl Pikes. 5)  Happy holidays.  Prescriptions: HYDROXYZINE HCL 25 MG TABS (HYDROXYZINE HCL) 1 tab by mouth three times a day as needed for hives, itching or anxiety  #30 x 1   Entered and Authorized by:   Antoine Primas DO   Signed by:   Terese Door on 10/27/2010   Method used:   Electronically to        [facility]. [phone]* (retail)       [address]        [place], [place]  [address]       Ph: [phone]       Fax: [phone]   RxID:   [id number] AZITHROMYCIN 250 MG TABS (AZITHROMYCIN) 2 tabs by mouth daily for the next 3 days  #6 x 0   Entered and Authorized by:   Antoine Primas DO   Signed by:   Terese Door on 10/27/2010   Method used:   Electronically to        [facility]. [phone]* (retail)  [address]       [place], [place]  [address]       Ph: [phone]       Fax: [phone]   RxID:   [id number]    Orders Added: 1)  Urinalysis-[facility] [00000] 2)  [facility]- Est  Level 4 [[address]]    Laboratory Results   Urine Tests  Date/Time Received: October 27, 2010 12:23 PM  Date/Time Reported: October 27, 2010 1:30 PM    Routine Urinalysis   Color: red Appearance: Hazy Glucose: negative   (Normal Range: Negative) Bilirubin: negative   (Normal Range: Negative) Ketone: negative   (Normal Range: Negative) Spec. Gravity: 1.015   (Normal Range: 1.003-1.035) Blood: large   (Normal Range: Negative) pH: 6.5   (Normal Range: 5.0-8.0) Protein: 100   (Normal Range: Negative) Urobilinogen: 0.2   (Normal Range: 0-1) Nitrite: negative   (Normal Range: Negative) Leukocyte Esterace: small   (Normal Range: Negative)  Urine Microscopic WBC/HPF: 0-3 RBC/HPF: 20+ Bacteria/HPF: trace Epithelial/HPF: 0-3    Comments: ...........test performed by...........Marland KitchenTerese Door, CMA      Appended Document: f/u UC,df did look at urgent care labs white count up at 15.5 but was already on prednosone.   Pt also had mildly elevated ESR.  not truely concern.

## 2010-12-08 NOTE — Progress Notes (Signed)
Summary: Lab Res  Phone Note Call from Patient Call back at [facility] Phone [phone]   Caller: Patient Summary of Call: Pt checking on lab and pap results. Initial call taken by: Clydell Hakim,  May 25, 2010 10:07 AM  Follow-up for Phone Call        Patient informed of results, expressed understanding. Follow-up by: Garen Grams LPN,  May 25, 2010 10:31 AM

## 2010-12-08 NOTE — Assessment & Plan Note (Signed)
Summary: plantar faciitis   Vital Signs:  Patient profile:   40 year old female Weight:      220.8 pounds BMI:     38.34 Temp:     98.4 degrees F oral Pulse rate:   73 / minute BP sitting:   120 / 92  (left arm) Cuff size:   large  Vitals Entered By: Jimmy Footman, CMA (November 28, 2010 3:53 PM) CC: rt foot pain Is Patient Diabetic? No   Primary Care Provider:  Luretha Murphy NP  CC:  rt foot pain.  History of Present Illness: Right plantar faciitits still causing problems, has been working from a seated position and this is helpful.  She would like a note to continue this for a reasonable amount of time. She also needs to purchase a new pair of shoes, her current work shoes are broken down.  She is doing some exercises but not regularly.  Someone told her that this will never get better.  She is trying to loose weight and eat better at home.  She purchased heel cups and is wearing them.  Habits & Providers  Alcohol-Tobacco-Diet     Tobacco Status: current  Allergies: No Known Drug Allergies  Review of Systems      See HPI  Physical Exam  General:  alert and well-developed/ BMI in obese category.   Msk:  tender plantar fascia, at calcaneal insertion.   Impression & Recommendations:  Problem # 1:  PLANTAR FASCIITIS, BILATERAL (ICD-728.71)  reinforced importance of exercises and icing, practiced together in the room, note for work for 2 more weeks, weight loss discussed Her updated medication list for this problem includes:    Mobic 15 Mg Tabs (Meloxicam) ..... One tab by mouth daily for pain  Orders: [facility]- Est Level  2 ([address]40)  Complete Medication List: 1)  Loratadine Allergy Relief 10 Mg Tbdp (Loratadine) .... One daily 2)  Mobic 15 Mg Tabs (Meloxicam) .... One tab by mouth daily for pain  Patient Instructions: 1)  Please do the exercises as insturcted several times a day and even some at work during your break. 2)  Return in June for health maintenence exam and  labs, first mammogram is due as well.   Orders Added: 1)  [facility]- Est Level  2 [98119]     Prevention & Chronic Care Immunizations   Influenza vaccine: refused  (01/26/2009)   Influenza vaccine due: 01/26/2010    Tetanus booster: 08/07/1999: Done.   Tetanus booster due: 08/06/2009    Pneumococcal vaccine: Not documented  Other Screening   Pap smear: NEGATIVE FOR INTRAEPITHELIAL LESIONS OR MALIGNANCY.  (04/15/2010)   Pap smear due: 08/12/2009    Mammogram: Not documented   Smoking status: current  (11/28/2010)   Smoking cessation counseling: yes  (01/11/2009)   [employer] quit date: 04/06/2008  (03/24/2008)  Lipids   Total Cholesterol: Not documented   LDL: Not documented   LDL Direct: Not documented   HDL: Not documented   Triglycerides: Not documented   Nursing Instructions: Give tetanus booster today    Appended Document: plantar faciitis   Immunizations Administered:  Tetanus Vaccine:    Vaccine Type: Tdap    Site: right deltoid    Mfr: boosterix    Dose: 0.5 ml    Route: IM    Given by: Jimmy Footman, CMA    Exp. Date: 08/25/2012    Lot #: [id number]    VIS given: 09/23/08 version given November 28, 2010.

## 2010-12-08 NOTE — Letter (Signed)
Summary: Work Excuse  [facility]  [address]   [place], [place] [address]   Phone: [phone]  Fax: [phone]    Today's Date: November 17, 2010  Name of Patient: Deanna Scott [facility]  The above named patient had a medical visit today at: 4:00 pm.  Please take this into consideration when reviewing the time away from work.    Special Instructions:  [  ] None  [  ] To be off the remainder of today, returning to the normal work / school schedule tomorrow.  [  ] To be off until the next scheduled appointment on ______________________.  [x]  Other: For the next two (2) weeks  patient is not to be on her feet longer than 2 hours, and she is to wear athletic (tennis shoes).   Sincerely yours,   Loralee Pacas CMA

## 2010-12-08 NOTE — Miscellaneous (Signed)
  Clinical Lists Changes  Medications: Added new medication of FAMOTIDINE 40 MG TABS (FAMOTIDINE) one tab two times a day as needed hives - Signed Added new medication of LORATADINE ALLERGY RELIEF 10 MG TBDP (LORATADINE) one daily - Signed Rx of FAMOTIDINE 40 MG TABS (FAMOTIDINE) one tab two times a day as needed hives;  #60 x 1;  Signed;  Entered by: Luretha Murphy NP;  Authorized by: Luretha Murphy NP;  Method used: Print then Give to Patient Rx of LORATADINE ALLERGY RELIEF 10 MG TBDP (LORATADINE) one daily;  #30 x 6;  Signed;  Entered by: Luretha Murphy NP;  Authorized by: Luretha Murphy NP;  Method used: Print then Give to Patient    Prescriptions: LORATADINE ALLERGY RELIEF 10 MG TBDP (LORATADINE) one daily  #30 x 6   Entered and Authorized by:   Luretha Murphy NP   Signed by:   Luretha Murphy NP on 10/20/2010   Method used:   Print then Give to Patient   RxID:   [id number] FAMOTIDINE 40 MG TABS (FAMOTIDINE) one tab two times a day as needed hives  #60 x 1   Entered and Authorized by:   Luretha Murphy NP   Signed by:   Luretha Murphy NP on 10/20/2010   Method used:   Print then Give to Patient   RxID:   [id number]

## 2010-12-08 NOTE — Letter (Signed)
Summary: Generic Letter  [facility]  [address]   [place], [place] [address]   Phone: [phone]  Fax: [phone]    08/23/2010  [facility] [address] [place], [place]  [address]  Dear Ms. Gunby,   All testing was normal.        Sincerely,   Luretha Murphy NP  Appended Document: Generic Letter mailed

## 2010-12-08 NOTE — Miscellaneous (Signed)
  Clinical Lists Changes  Problems: Removed problem of VAGINITIS, BACTERIAL (ICD-616.10) Removed problem of SCREENING FOR MALIGNANT NEOPLASM OF THE CERVIX (ICD-V76.2)

## 2010-12-08 NOTE — Assessment & Plan Note (Signed)
Summary: female problem,tcb   Vital Signs:  Patient profile:   40 year old female Weight:      212.8 pounds Pulse rate:   82 / minute BP sitting:   125 / 88  (right arm)  Vitals Entered By: Arlyss Repress CMA, (April 15, 2010 8:45 AM) CC: vag d/c x 5days. Is Patient Diabetic? No Pain Assessment Patient in pain? no        Primary Care Provider:  Romero Belling MD  CC:  vag d/c x 5days.Marland Kitchen  History of Present Illness: Unprotected sexual intercourse with an old friend, having discharge.  Still having urine leaking when she coughs or sneezes.  Seen by GYN and has Grade 1-2 uterine prolapse.  Recommndations were to loose weight and do Kegals.  She is walking daily, trying to quit smoking.  Habits & Providers  Alcohol-Tobacco-Diet     Tobacco Status: current     Tobacco Counseling: to quit use of tobacco products  Current Medications (verified): 1)  Metronidazole 500 Mg Tabs (Metronidazole) .... 4 Tabs Times One  Allergies (verified): No Known Drug Allergies  Physical Exam  General:  Well-developed,well-nourished,in no acute distress; alert,appropriate and cooperative throughout examination Genitalia:  Normal introitus for age, no external lesions, no vaginal discharge, mucosa pink and moist, no vaginal or cervical lesions, no vaginal atrophy, no friaility or hemorrhage, normal uterus size and position, no adnexal masses or tenderness Wet prep + trich   Impression & Recommendations:  Problem # 1:  CONTACT OR EXPOSURE TO OTHER VIRAL DISEASES (ICD-V01.79)  Orders: GC/Chlamydia-[facility] (87591/87491) Wet Prep- [facility] (04540) [facility]- Est Level  3 (98119)  Problem # 2:  SCREENING FOR MALIGNANT NEOPLASM OF THE CERVIX (ICD-V76.2)  Orders: Pap Smear-[facility] (14782-95621) [facility]- Est Level  3 ([address]213)  Problem # 3:  TRICHOMONAL VAGINITIS (ICD-131.01)  Metroidazole 2 GM dose, counseling regarding safe sex  Orders: [facility]- Est Level  3 (99213)  Complete Medication List: 1)  Metronidazole  500 Mg Tabs (Metronidazole) .... 4 tabs times one  Patient Instructions: 1)  Please schedule a follow-up appointment as needed .  2)  If you could be exposed to sexually transmitted diseases. you should use a condom.  Prescriptions: METRONIDAZOLE 500 MG TABS (METRONIDAZOLE) 4 tabs times one  #4 x 0   Entered and Authorized by:   Luretha Murphy NP   Signed by:   Luretha Murphy NP on 04/15/2010   Method used:   Print then Give to Patient   RxID:   [id number]   Laboratory Results  Date/Time Received: April 15, 2010 9:03 AM  Date/Time Reported: April 15, 2010 9:28 AM   Wet [place] Source: vag WBC/hpf: 1-5 Bacteria/hpf: 2+  Cocci Clue cells/hpf: moderate  Positive whiff Yeast/hpf: none Trichomonas/hpf: moderate Comments: ...............test performed by......Marland KitchenBonnie A. [place], MLS (ASCP)cm

## 2010-12-09 NOTE — Miscellaneous (Signed)
Summary: Consent for Drainage of abscess on Right Middle Finger  Consent for Drainage of abscess on Right Middle Finger   Imported By: Clydell Hakim 01/03/2010 16:58:18  _____________________________________________________________________  External Attachment:    Type:   Image     Comment:   External Document

## 2011-01-16 LAB — DIFFERENTIAL
Basophils Absolute: 0 10*3/uL (ref 0.0–0.1)
Lymphocytes Relative: 24 % (ref 12–46)
Lymphs Abs: 3.7 10*3/uL (ref 0.7–4.0)
Neutro Abs: 10.9 10*3/uL — ABNORMAL HIGH (ref 1.7–7.7)

## 2011-01-16 LAB — CBC
MCH: 30.6 pg (ref 26.0–34.0)
MCHC: 33.5 g/dL (ref 30.0–36.0)
MCV: 91.5 fL (ref 78.0–100.0)
Platelets: 244 10*3/uL (ref 150–400)
RBC: 4.7 MIL/uL (ref 3.87–5.11)

## 2011-01-16 LAB — COMPREHENSIVE METABOLIC PANEL
AST: 15 U/L (ref 0–37)
Albumin: 3.7 g/dL (ref 3.5–5.2)
BUN: 12 mg/dL (ref 6–23)
CO2: 25 mEq/L (ref 19–32)
Calcium: 9.1 mg/dL (ref 8.4–10.5)
Chloride: 107 mEq/L (ref 96–112)
Creatinine, Ser: 0.74 mg/dL (ref 0.4–1.2)
GFR calc Af Amer: >60 mL/min (ref >60)
GFR calc non Af Amer: >60 mL/min (ref >60)
Total Bilirubin: 0.5 mg/dL (ref 0.3–1.2)

## 2011-01-16 LAB — SEDIMENTATION RATE: Sed Rate: 33 mm/hr — ABNORMAL HIGH (ref 0–22)

## 2011-01-18 ENCOUNTER — Ambulatory Visit (INDEPENDENT_AMBULATORY_CARE_PROVIDER_SITE_OTHER): Payer: Self-pay | Admitting: Family Medicine

## 2011-01-18 ENCOUNTER — Encounter: Payer: Self-pay | Admitting: Family Medicine

## 2011-01-18 VITALS — BP 122/84 | HR 92 | Wt 216.5 lb

## 2011-01-18 DIAGNOSIS — M7501 Adhesive capsulitis of right shoulder: Secondary | ICD-10-CM

## 2011-01-18 DIAGNOSIS — M7500 Adhesive capsulitis of unspecified shoulder: Secondary | ICD-10-CM

## 2011-01-18 NOTE — Patient Instructions (Signed)
Adhesive Capsulitis  Sometimes the shoulder becomes stiff and is painful to move. Some people say it feels as if the shoulder is frozen in place. Because of this, the condition is called "frozen shoulder." Its medical name is adhesive capsulitis.    The shoulder joint is made up of strong connective tissue that attaches the ball of the humerus to the shallow shoulder socket. This strong connective tissue is called the joint capsule. This tissue can become stiff and swollen. That is when adhesive capsulitis sets in.  CAUSES  It is not always clear just what the cause adhesive capsulitis. Possibilities include:   Injury to the shoulder joint.    Strain. This is a repetitive injury brought about by overuse.    Lack of use. Perhaps your arm or hand was otherwise injured. It might have been in a sling for awhile. Or perhaps you were not using it to avoid pain.    Referred pain. This is a sort of trick the body plays. You feel pain in the shoulder. But, the pain actually comes from an injury somewhere else in the body.    Long-standing health problems. Several diseases can cause adhesive capsulitis. They include diabetes, heart disease, stroke, thyroid problems, rheumatoid arthritis and lung disease.    Being a women older than 40. Anyone can develop adhesive capsulitis but it is most common in women in this age group.   SYMPTOMS   Pain.    It occurs when the arm is moved.    Parts of the shoulder might hurt if they are touched.    Pain is worse at night or when resting.    Soreness. It might not be strong enough to be called pain. But, the shoulder aches.    The shoulder does not move freely.    Muscle spasms.    Trouble sleeping because of shoulder ache or pain.   DIAGNOSIS  To decide if you have adhesive capsulitis, your healthcare provider will probably:   Ask about symptoms you have noticed.    Ask about your history of joint pain and anything that might have caused the pain.     Ask about your overall health.    Use hands to feel your shoulder and neck.    Ask you to move your shoulder in specific directions. This may indicate the origin of the pain.    Order imaging tests; pictures of the shoulder. They help pinpoint the source of the problem. An X-ray might be used. For more detail, an MRI is often used. An MRI details the tendons, muscles and ligaments as well as the joint.   TREATMENT  Adhesive capsulitis can be treated several ways. Most treatments can be done in a clinic or in your healthcare provider's office. Be sure to discuss the different options with your caregiver. They include:   Physical therapy. You will work on specific exercises to get your shoulder moving again. The exercises usually involve stretching. A physical therapist (a caregiver with special training) can show you what to do and what not to do. The exercises will need to be done daily.    Medication.    Over-the-counter medicines may relieve pain and inflammation (the body's way of reacting to injury or infection).    Corticosteroids. These are stronger drugs to reduce pain and inflammation. They are given by injection (shots) into the shoulder joint. Frequent treatment is not recommended.    Muscle relaxants. Medication may be prescribed to ease muscle spasms.      Treatment of underlying conditions. This means treating another condition that is causing your shoulder problem. This might be a rotator cuff (tendon) problem    Shoulder manipulation. The shoulder will be moved by your healthcare provider. You would be under general anesthesia (given a drug that puts you to sleep). You would not feel anything. Sometimes the joint will be injected with salt water (saline) at high pressure to break down internal scarring in the joint capsule.    Surgery. This is rarely needed. It may be suggested in advanced cases after all other treatment has failed.   PROGNOSIS   In time, most people recover from adhesive capsulitis. Sometimes, however, the pain goes away but full movement of the shoulder does not return.    HOME CARE INSTRUCTIONS   Take any pain medications recommended by your healthcare provider. Follow the directions carefully.    If you have physical therapy, follow through with the therapist's suggestions. Be sure you understand the exercises you will be doing. You should understand:    How often the exercises should be done.    How many times each exercise should be repeated.    How long they should be done.    What other activities you should do, or not do.    That you should warm up before doing any exercise. Just 5 to 10 minutes will help. Small, gentle movements should get your shoulder ready for more.    Avoid high-demand exercise that involves your shoulder such as throwing. This type of exercise can make pain worse.    Consider using cold packs. Cold may ease swelling and pain. Ask your healthcare provider if a cold pack might help you. If so, get directions on how and when to use them.   SEEK MEDICAL CARE IF:   You have any questions about your medications.    Your pain continues to increase.   Document Released: 08/20/2009    [employer] Patient Information 2011 [employer].

## 2011-01-18 NOTE — Assessment & Plan Note (Signed)
Spent majority of visit educating patient about natural course of this.  This will likely take months to get better.  Advised Tylenol/Ibuprofen as needed.  If severely limiting activity would consider large volume shoulder injection by Sports medicine.  Provided patient with a couple handouts with information and exercises that she can do.

## 2011-01-18 NOTE — Progress Notes (Signed)
  Subjective:    Patient ID: Deanna Scott, female    DOB: [birth year], 40 y.o.   MRN: [id number]  Shoulder Pain  The pain is present in the right shoulder. This is a new problem. The current episode started 1 to 4 weeks ago. There has been no history of extremity trauma. The problem occurs constantly. The problem has been unchanged. The quality of the pain is described as dull. The pain is at a severity of 4/10. The pain is moderate. Associated symptoms include a limited range of motion and stiffness. Pertinent negatives include no joint locking, joint swelling, numbness or tingling. The symptoms are aggravated by activity. She has tried nothing for the symptoms. Family history does not include gout or rheumatoid arthritis. There is no history of diabetes, gout, osteoarthritis or rheumatoid arthritis.      Review of Systems  Musculoskeletal: Positive for stiffness. Negative for gout.  Neurological: Negative for tingling and numbness.       Objective:   Physical Exam  Constitutional: No distress.  Neck: Normal range of motion.  Cardiovascular: Normal rate and regular rhythm.   Pulmonary/Chest: Effort normal and breath sounds normal. No respiratory distress.  Musculoskeletal:       Right shoulder:  Limited ROM with extension, flexion, and abduction.  Stops at about 90degrees.  Unable to actively or passively move it pass this.  No AC joint tenderness.  Normal 5/5 strength.  Normal sensation  Left shoulder: normal          Assessment & Plan:

## 2011-02-08 ENCOUNTER — Ambulatory Visit (INDEPENDENT_AMBULATORY_CARE_PROVIDER_SITE_OTHER): Payer: Self-pay | Admitting: Family Medicine

## 2011-02-08 ENCOUNTER — Encounter: Payer: Self-pay | Admitting: Family Medicine

## 2011-02-08 DIAGNOSIS — M722 Plantar fascial fibromatosis: Secondary | ICD-10-CM

## 2011-02-08 NOTE — Patient Instructions (Signed)
Make an appt at our front desk for sports medicine in the next 1-2 weeks I have written a note for light duty at work until May 1 Please try to do the exercises for your shoulder

## 2011-02-13 ENCOUNTER — Encounter: Payer: Self-pay | Admitting: Family Medicine

## 2011-02-13 NOTE — Progress Notes (Signed)
  Subjective:    Patient ID: Deanna Scott, female    DOB: 1971-10-16, 40 y.o.   MRN: [id number]  HPI  Presents for f/u of plantar fasciitis.  Has done stretches, ice, motrin.  Has to stand all day at work.  No relief of symptoms.  Review of Systems Denies fever, CP , HA    Objective:   Physical Exam Vital signs reviewed General appearance - alert, well appearing, and in no distress and oriented to person, place, and time MSK: bilateral feet with high arch.  TTP on insertion of plantar fascia        Assessment & Plan:

## 2011-02-13 NOTE — Assessment & Plan Note (Signed)
Plantar fasciitis 2/2 high arch, cavus foot.  Pt to continue motrin and stretches, go to [facility] for eval for orthotics to correct mechanical issues.

## 2011-02-15 ENCOUNTER — Ambulatory Visit (INDEPENDENT_AMBULATORY_CARE_PROVIDER_SITE_OTHER): Payer: Self-pay | Admitting: Family Medicine

## 2011-02-15 DIAGNOSIS — M722 Plantar fascial fibromatosis: Secondary | ICD-10-CM

## 2011-02-15 NOTE — Progress Notes (Signed)
  Subjective:    Patient ID: Deanna Scott, female    DOB: 1971/07/07, 40 y.o.   MRN: [id number]  HPI Deanna Scott is a 40 year old female referred by the [facility] for evaluation and treatment of suspected bilateral plantar fasciitis, and evaluation for custom orthotics. She is basically had many months of bilateral plantar foot pain, worst in the medial heel region on the right side and in the left foot arch region. Frequently this is worse when she first wakes up the morning, but she is on her feet all day as she works at [employer] on the campus of [employer] and notices that she gets worse pain throughout the day the more she is on her feet. But in the day her feet are very sore. She's previously been given heel cups, stretches, and ice can heel rolls. She was offered an injection at one point, but would like to avoid that. She states the stretches are painful but may be helping a little, but the heel cups do not help at all. She is also taking ibuprofen occasionally which does help.   Review of Systems Denies fever, chills, night sweats, or weight loss.    Objective:   Physical Exam Gen. appearance: Well-appearing overweight female in no distress Leg length: Equal Right foot: pes cavus with some associated transverse arch breakdown. Positive tenderness on the proximal medial aspect of the foot near the origin of the prior fascia but also just distal to this. Actually has good range of motion at the ankle and at the great toe. No posterior tenderness to palpation. Upon standing she does have little bit of collapse of the longitudinal arch. Normal posttib function. Left foot: Pes cavus with some associated transverse arch breakdown and a little bit of longitudinal arch breakdown. Minimal tenderness at the heel, but fairly significant tenderness at the mid to distal portion of the plantar fascia. Normal posttib chin. No abnormal callus. She does seem to have a slight amount of excess  supination of the left foot compared to the right foot both standing and with gait.       Assessment & Plan:  Bilateral foot pain, mostly consistent with plantar fasciitis, but with her large body habitus she may actually get some fat pad or calcaneal bruising along with this. -She declined cortisone shot again today -I reviewed other exercises with her, especially including the seated plantar fascia stretch and the heel drop. -We made her some custom orthotics see procedure note below. She'll follow up with me as needed, especially if she decides she would like a cortisone shot.  Patient was fitted for a : standard, cushioned, semi-rigid orthotic. The orthotic was heated and afterward the patient stood on the orthotic blank positioned on the orthotic stand. The patient was positioned in subtalar neutral position and 10 degrees of ankle dorsiflexion in a weight bearing stance. After completion of molding, a stable base was applied to the orthotic blank. The blank was ground to a stable position for weight bearing. Size:9 Base:blue EVA Posting:none Additional orthotic padding:none  She tried these prior to leaving and they're very comfortable.

## 2011-02-21 ENCOUNTER — Encounter: Payer: Self-pay | Admitting: Family Medicine

## 2011-02-21 ENCOUNTER — Ambulatory Visit (INDEPENDENT_AMBULATORY_CARE_PROVIDER_SITE_OTHER): Payer: Self-pay | Admitting: Family Medicine

## 2011-02-21 VITALS — BP 118/96 | HR 64 | Temp 98.6°F | Ht 62.0 in | Wt 217.0 lb

## 2011-02-21 DIAGNOSIS — M7541 Impingement syndrome of right shoulder: Secondary | ICD-10-CM

## 2011-02-21 NOTE — Patient Instructions (Signed)
It was nice meeting you today.  The numbing medication may give you some relief today.  It will take a couple of days for the steroid to start working.  You can continue your ibuprofen as you have been in the mean time.  I would like for you to come back in a couple of weeks to see how the shoulder is doing.  We will talk about exercises to help strengthen the shoulder at that time, if you are feeling better.

## 2011-02-21 NOTE — Progress Notes (Signed)
  Subjective:    Patient ID: Deanna Scott, female    DOB: [birth year], 40 y.o.   MRN: [id number]  HPI Here today with continued shoulder pain. Diagnosed with frozen shoulder in March which had been gradually improving.  However recently developed increased pain in her shoulder.  Has been so bad at times it has kept her awake.  Has had difficulty lying on affected side.  Pain radiates from outer shoulder up the side of her neck.  Describes pain as sharp with certain movements and throbbing at times.  ROM has been improving.  Has most pain with lifting arm above head.  No trauma recently and has not done any work or exercise to exacerbate this.  Has been using ibuprofen at home and has never had injection in shoulder   Review of Systems Denies sob, chest pain, radicular symptoms.    Objective:   Physical Exam  Musculoskeletal:       Right shoulder: She exhibits tenderness, spasm and decreased strength. She exhibits normal range of motion, no bony tenderness, no swelling and no effusion.        Active ROM normal except for flexion/abduction, cannot get past ~90-100 degrees without significant pain,  Passive ROM normal.  Spasm in upper trapezius  Full and empty can + for decreased strength when compared to L. Lift off negative Hawkins and neer + for reproduction of pain Scarf negative     Procedure Note: Written consent obtained for R shoulder injection Area marked and time out performed Area prepped in typical fashion using iodine and alcohol R shoulder injected with 1cc 40mg /ml Kenalog with 4cc marcaine using posterior subacromial approach Minimal bleeding, controlled with band-aid No complcations Patient tolerated procedure well        Assessment & Plan:

## 2011-02-23 DIAGNOSIS — M7541 Impingement syndrome of right shoulder: Secondary | ICD-10-CM | POA: Insufficient documentation

## 2011-02-23 NOTE — Assessment & Plan Note (Addendum)
Signs and symptoms as well as + hawkins/neer currently consistent with impingement syndrome of R shoulder.  Full/Empty can with decreased strength when compared to L side, may have some partial tear of supraspinatus.  Subacromial injection done today.  If not improving may need imaging of shoulder to rule out rotator cuff pathology.  Told to continue ibuprofen and follow up in two weeks

## 2011-03-14 ENCOUNTER — Ambulatory Visit: Payer: Self-pay | Admitting: Family Medicine

## 2011-03-21 ENCOUNTER — Encounter: Payer: Self-pay | Admitting: Family Medicine

## 2011-03-21 ENCOUNTER — Ambulatory Visit (INDEPENDENT_AMBULATORY_CARE_PROVIDER_SITE_OTHER): Payer: Self-pay | Admitting: Family Medicine

## 2011-03-21 VITALS — BP 134/86 | HR 72 | Wt 213.3 lb

## 2011-03-21 DIAGNOSIS — M7541 Impingement syndrome of right shoulder: Secondary | ICD-10-CM

## 2011-03-21 NOTE — Progress Notes (Signed)
  Subjective:    Patient ID: Deanna Scott, female    DOB: Oct 02, 1971, 40 y.o.   MRN: [id number]  HPI Shoulder injection last month with improvement.  She is trying to change her diet for her and her daughters.  She does not work in the summer and plans for this to be her focus.  She would like to begin some exercises.  She does not take meds, just a little ibuprofen.   Review of Systems  All other systems reviewed and are negative.       Objective:   Physical Exam  Constitutional: She appears well-developed and well-nourished.  Musculoskeletal:       Decreased ROM to full extension of right shoulder          Assessment & Plan:

## 2011-03-21 NOTE — Op Note (Signed)
Deanna Scott, Deanna Scott              ACCOUNT NO.:  [id number]   MEDICAL RECORD NO.:  [id number]          PATIENT TYPE:  AMB   LOCATION:  [facility]                         FACILITY:  [facility]   PHYSICIAN:  Mark C. Vernie Ammons, M.D.  DATE OF BIRTH:  05/19/1971   DATE OF PROCEDURE:  10/21/2007  DATE OF DISCHARGE:                               OPERATIVE REPORT   PREOPERATIVE DIAGNOSES:  1. Stress urinary incontinence.  2. Cystocele.   POSTOPERATIVE DIAGNOSES:  1. Stress urinary incontinence.  2. Cystocele.   PROCEDURE:  1. Suprapubic sling.  2. Anterior repair.   SURGEON:  Mark C. Vernie Ammons, M.D.   ANESTHESIA:  General.   BLOOD LOSS:  Approximately 100 mL.   DRAINS:  None.   SPECIMENS:  None.   COMPLICATIONS:  None.   INDICATIONS:  The patient is a 40 year old female with a 2 to 3-year  history of stress urinary incontinence requiring pads.  She had no  bladder instability.  She has had two prior vaginal deliveries with some  lacerations.  She was found on exam to have a minimal cystocele that  then becomes more pronounced when the bladder is filled.  She also was  demonstrated to have a significant stress urinary incontinence.  I have  discussed surgical correction, the risks, complications, and  alternatives with the patient.  She understands and has elected to  proceed.   DESCRIPTION OF OPERATION:  After informed consent, the patient was  brought to the major OR, placed on the table and administered general  anesthesia and then moved to the dorsal lithotomy position.  Her lower  abdomen, vagina, and genitalia were sterilely prepped and draped.  Official timeout was then performed.   A 16-French Foley catheter was then placed in the bladder, and the  bladder was drained.  A weighted speculum was placed in the vagina, and  I noted a grade 2 cystocele present that decreased just slightly as the  bladder was drained.  There was some prolapse of the uterus as well seen  today.  No  vaginal lesions were identified.   0.25% Marcaine with epinephrine was then used to infiltrate the  subvaginal mucosa in the midline back to about the level of the cervix.  After allowing adequate time for epinephrine effect, a midline incision  was then made from the area of the distal introitus back to near the  cervix in the midline.  I then placed Allis clamps on the vaginal mucosa  and used initially sharp and then blunt technique to dissect the bladder  and pubocervical fascia from the vaginal mucosa posteriorly.  This was  performed first on the right and then left sides.  Pubocervical fascia  was easily identified after performing this maneuver.   Attention was then directed to the suprapubic region, and 4 cm lateral  to the midline on each side at the level of the superior border of the  symphysis pubis, the 0.25% Marcaine with epinephrine was injected and a  stab incision was made in each of these locations.  The bladder was then  fully drained.  The catheter was  removed, and a 22-French cystoscope  sheath was placed in the bladder with obturator and used to deflect the  bladder neck away from the passage of the trocar.  The sling trocar was  passed through the stab skin incision on the left-hand side, passed  behind the symphysis pubis hugging the pubis, and palpated by digital  palpation and the passed out the vaginal incision at the midurethral  level.  An identical procedure was then performed on the right-hand  side.   I then inserted the 70-degree lens through the cystoscope sheath and  fully inspected the bladder.  No tumor, stones, or inflammatory lesions  were identified.  The ureteral orifices were normal in configuration and  position.  The cystocele was identified.  No evidence of bladder injury,  perforation, or foreign body was noted.  I then affixed the sling  material to the distal tip of each right and left trocar and then  withdrew this back up through the  skin incisions.  I then reinspected  the bladder a second time cystoscopically and again noted no foreign  body, tape injury, or other abnormality.  The cystoscope was therefore  removed and a Foley catheter replaced.  I then positioned the sling at  the midurethral level, placed forceps beneath the sling material, and  then removed the plastic coating from first the left and then right side  of the sling.  This  allowed the sling to lay in good position at the  midurethral level with no tension.  Excess sling material was then cut  at the skin level, and the skin was closed with Dermabond.   Attention was then directed to the cystocele, and it was closed by  reapproximating the pubocervical fascia in the midline using 0 Ethibond  in an interrupted fashion.  This reduced the cystocele nicely.  I then  excised the redundant vaginal mucosa and then closed the vaginal  incision with a running 2-0 Vicryl suture after copiously irrigating  with antibiotic solution.  Iodoform gauze packing coated with Neosporin  was then used as a vaginal packing, and the catheter was removed.   The patient was awakened and taken to the recovery room in stable and  satisfactory condition.  She tolerated the procedure well.  There were  no intraoperative complications.  Needle, sponge, and instrument counts  were correct x2 at the end of the operation.   She will be given a prescription for Tylox, #38, and Cipro 500 mg  b.i.d., #10.  She will followup in my office in 1 week and remove her  vaginal packing at home in 24 hours.      Mark C. Vernie Ammons, M.D.  Electronically Signed     MCO/MEDQ  D:  10/21/2007  T:  10/22/2007  Job:  [id number]

## 2011-03-21 NOTE — Patient Instructions (Signed)
Begin to stretch and swim daily Continue to reduce the processed food in the diet

## 2011-03-21 NOTE — Assessment & Plan Note (Signed)
She was give a copy of exercises and a stretch band.  She was instructed to complete the exercises multiple reps per day.

## 2011-06-07 ENCOUNTER — Ambulatory Visit (INDEPENDENT_AMBULATORY_CARE_PROVIDER_SITE_OTHER): Payer: Self-pay | Admitting: Family Medicine

## 2011-06-07 ENCOUNTER — Encounter: Payer: Self-pay | Admitting: Family Medicine

## 2011-06-07 DIAGNOSIS — J309 Allergic rhinitis, unspecified: Secondary | ICD-10-CM

## 2011-06-07 DIAGNOSIS — L509 Urticaria, unspecified: Secondary | ICD-10-CM

## 2011-06-07 DIAGNOSIS — L508 Other urticaria: Secondary | ICD-10-CM | POA: Insufficient documentation

## 2011-06-07 MED ORDER — CETIRIZINE HCL 10 MG PO CHEW: 10.0000 mg | CHEWABLE_TABLET | Freq: Two times a day (BID) | ORAL | Status: DC

## 2011-06-07 MED ORDER — AMOXICILLIN 500 MG PO CAPS: 500.0000 mg | ORAL_CAPSULE | Freq: Two times a day (BID) | ORAL | Status: DC

## 2011-06-07 NOTE — Patient Instructions (Signed)
These are hives, which means that your body is reacting to something it is allergic to  Take the zyrtec twice a day until they go away  Also, I am giving you 5 days of antibiotics in case you have an infection  Call us or go to the ED if you have trouble breathing  Come back if these are not better in 10 days

## 2011-06-07 NOTE — Assessment & Plan Note (Signed)
Worse today.  Start zyrtec

## 2011-06-07 NOTE — Progress Notes (Signed)
  Subjective:    Patient ID: Deanna Scott, female    DOB: December 04, 1970, 40 y.o.   MRN: [id number]  HPI Pt presents with pruritic rash x 3 days.  No fever.  Is reporting some nasal drainage.  No new exposures, no new meds.  Has had this rash before, usually 2-3 times a year.  Last time treated with allergy meds, steroids and then an abx.     Review of Systems Denies CP, SOB, HA, N/V/D, fever     Objective:   Physical Exam  Vital signs reviewed General appearance - alert, well appearing, and in no distress and oriented to person, place, and time Eyes - pupils equal and reactive, extraocular eye movements intact, sclera anicteric Ears - bilateral TM's and external ear canals normal, right ear normal, left ear normal Nose - normal and patent, mild erythema, no discharge or polyps Throat - clear, no drainage Neck- a small enlarged lymph node postauricular Skin- red confluent rash, erythema, heat.  Round with some central clearing in some of the lesions       Assessment & Plan:

## 2011-06-07 NOTE — Assessment & Plan Note (Signed)
No resp distress.  Will start zyrtec BID until resolved and amoxicillin x 5 days.  May biopsy if pt would like to know for sure just hives

## 2011-06-08 ENCOUNTER — Other Ambulatory Visit: Payer: Self-pay | Admitting: Family Medicine

## 2011-06-08 ENCOUNTER — Telehealth: Payer: Self-pay | Admitting: Family Medicine

## 2011-06-08 MED ORDER — PREDNISONE 20 MG PO TABS: 40.0000 mg | ORAL_TABLET | Freq: Every day | ORAL | Status: AC

## 2011-06-08 MED ORDER — PREDNISONE 20 MG PO TABS: 40.0000 mg | ORAL_TABLET | Freq: Every day | ORAL | Status: DC

## 2011-06-08 NOTE — Telephone Encounter (Signed)
Not affecting breathing.  Also pain in leg from knee to foot.  No swelling.  Rash is more spread out.  On face and back.  Will start prednisone today. Monday for follow up appt, pt to call to schedule

## 2011-06-08 NOTE — Telephone Encounter (Signed)
Pt was seen yesterday for a rash and it is getting worse.  Wants to talk to nurse to see what to do.

## 2011-06-08 NOTE — Telephone Encounter (Signed)
Was seen by Dr. Hulen Luster who is in clinic this am.  Will route to her.

## 2011-06-20 ENCOUNTER — Ambulatory Visit (INDEPENDENT_AMBULATORY_CARE_PROVIDER_SITE_OTHER): Payer: Self-pay | Admitting: Family Medicine

## 2011-06-20 ENCOUNTER — Encounter: Payer: Self-pay | Admitting: Family Medicine

## 2011-06-20 VITALS — BP 112/77 | HR 73 | Temp 98.2°F | Ht 62.0 in | Wt 214.0 lb

## 2011-06-20 DIAGNOSIS — L509 Urticaria, unspecified: Secondary | ICD-10-CM

## 2011-06-20 LAB — CBC WITH DIFFERENTIAL/PLATELET
Basophils Relative: 0 % (ref 0–1)
HCT: 41.5 % (ref 36.0–46.0)
Hemoglobin: 13.4 g/dL (ref 12.0–15.0)
Lymphocytes Relative: 25 % (ref 12–46)
Lymphs Abs: 2.8 10*3/uL (ref 0.7–4.0)
Monocytes Absolute: 0.7 10*3/uL (ref 0.1–1.0)
Monocytes Relative: 6 % (ref 3–12)
Neutro Abs: 7.4 10*3/uL (ref 1.7–7.7)
Neutrophils Relative %: 66 % (ref 43–77)
RBC: 4.41 MIL/uL (ref 3.87–5.11)

## 2011-06-20 NOTE — Patient Instructions (Signed)
Exercise daily Drink water up to 1.5 liters a day Eat 4-5 servings of fruit and veges daily Do not eat candy or sugar-ever Stop smoking

## 2011-06-20 NOTE — Assessment & Plan Note (Signed)
Education and reassurance.  Stressed the best course is one of health and not worry about bad things happening.  Will check CBC and diff to rule out worrisome cause of urticaria

## 2011-06-20 NOTE — Progress Notes (Signed)
  Subjective:    Patient ID: Deanna Scott, female    DOB: 15-Aug-1971, 40 y.o.   MRN: [id number]  HPI Deanna Scott is depressed, she thinks that there is something that her body if fighting and that is why she get recurrent hives.  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  She has not made any changes in her life style that have been discussed with her.  She plans to begin the nicotine patch in the morning.  She was seen by Dr. Hulen Luster and treated for hives, and given 5 days of amoxicillin last week for empiric treatment of an infection.  Deanna Scott believes that the last time she had hives and eventually had blood work, that an elevated WBC count was treated by [facility] with antibiotics and that finally made her better.  She would like blood work done today.   Review of Systems  Constitutional: Negative for fever and chills.  HENT: Negative for congestion.   Respiratory: Negative for cough.   Cardiovascular: Negative for chest pain and leg swelling.  Gastrointestinal: Negative for abdominal pain.  Skin: Positive for rash.  Hematological: Does not bruise/bleed easily.  Psychiatric/Behavioral: Positive for dysphoric mood.       Objective:   Physical Exam  Constitutional: She appears well-developed and well-nourished.  Cardiovascular: Normal rate and regular rhythm.   Pulmonary/Chest: Effort normal and breath sounds normal.  Skin:       No hives noted, did have some variation in skin tone on her forhead          Assessment & Plan:

## 2011-06-21 ENCOUNTER — Encounter: Payer: Self-pay | Admitting: Family Medicine

## 2011-08-11 LAB — I-STAT 8, (EC8 V) (CONVERTED LAB)
BUN: 13
Bicarbonate: 22.4
Chloride: 108
Glucose, Bld: 117 — ABNORMAL HIGH
HCT: 45
Hemoglobin: 15.3 — ABNORMAL HIGH
Operator id: 134391
Potassium: 4.1
Sodium: 137
TCO2: 23
pCO2, Ven: 29.6 — ABNORMAL LOW
pH, Ven: 7.487 — ABNORMAL HIGH

## 2011-08-11 LAB — POCT PREGNANCY, URINE
Operator id: 154361
Preg Test, Ur: NEGATIVE

## 2011-08-18 ENCOUNTER — Other Ambulatory Visit: Payer: Self-pay | Admitting: Family Medicine

## 2011-08-18 ENCOUNTER — Other Ambulatory Visit: Payer: Self-pay

## 2011-08-18 DIAGNOSIS — Z13220 Encounter for screening for lipoid disorders: Secondary | ICD-10-CM

## 2011-08-18 DIAGNOSIS — Z131 Encounter for screening for diabetes mellitus: Secondary | ICD-10-CM

## 2011-08-18 LAB — LIPID PANEL
Cholesterol: 181 mg/dL (ref 0–200)
HDL: 35 mg/dL — ABNORMAL LOW (ref >39)
Total CHOL/HDL Ratio: 5.2 Ratio

## 2011-08-18 LAB — COMPLETE METABOLIC PANEL WITH GFR
AST: 19 U/L (ref 0–37)
Alkaline Phosphatase: 100 U/L (ref 39–117)
GFR, Est Non African American: >60 mL/min (ref >60)
Glucose, Bld: 112 mg/dL — ABNORMAL HIGH (ref 70–99)
Sodium: 141 mEq/L (ref 135–145)
Total Bilirubin: 0.3 mg/dL (ref 0.3–1.2)
Total Protein: 6.8 g/dL (ref 6.0–8.3)

## 2011-08-18 NOTE — Progress Notes (Signed)
FLP AND CMP DONE TODAY Myrth Dahan 

## 2011-08-21 ENCOUNTER — Encounter: Payer: Self-pay | Admitting: Family Medicine

## 2011-08-23 ENCOUNTER — Ambulatory Visit (INDEPENDENT_AMBULATORY_CARE_PROVIDER_SITE_OTHER): Payer: Self-pay | Admitting: Family Medicine

## 2011-08-23 ENCOUNTER — Encounter: Payer: Self-pay | Admitting: Family Medicine

## 2011-08-23 VITALS — BP 105/64 | HR 76 | Ht 62.0 in | Wt 213.0 lb

## 2011-08-23 DIAGNOSIS — M722 Plantar fascial fibromatosis: Secondary | ICD-10-CM

## 2011-08-23 DIAGNOSIS — N814 Uterovaginal prolapse, unspecified: Secondary | ICD-10-CM

## 2011-08-23 NOTE — Patient Instructions (Signed)
Thank you for coming in today. Please continue the exercises you learned last time your feet hurt.  Come back is resting does not help.  We will call you about your appointment.   Plantar Fasciitis (Heel Spur Syndrome) with Rehab   The plantar fascia is a fibrous, ligament-like, soft-tissue structure that spans the bottom of the foot. Plantar fasciitis is a condition that causes pain in the foot due to inflammation of the tissue.  SYMPTOMS  Pain and tenderness on the underneath side of the foot.  Pain that worsens with standing or walking.   CAUSES Plantar fasciitis is caused by irritation and injury to the plantar fascia on the underneath side of the foot. Common mechanisms of injury include:  Direct trauma to bottom of the foot.  Damage to a small nerve that runs under the foot where the main fascia attaches to the heel bone.  Stress placed on the plantar fascia due to bone spurs.   RISK INCREASES WITH:    Activities that place stress on the plantar fascia (running, jumping, pivoting, or cutting).  Poor strength and flexibility.  Improperly fitted shoes.  Tight calf muscles.  Flat feet.  Failure to warm-up properly before activity.  Obesity.   PREVENTIVE MEASURES    Warm up and stretch properly before activity.  Allow for adequate recovery between workouts.  Maintain physical fitness: l Strength, flexibility, and endurance. l Cardiovascular fitness.  Maintain a health body weight.  Avoid stress on the plantar fascia.  Wear properly fitted shoes, including arch supports for individuals who have flat feet.   PROGNOSIS If treated properly, then the symptoms of plantar fasciitis usually resolve without surgery.  However, occasionally surgery is necessary.   POSSIBLE COMPLICATIONS    Recurrent symptoms that may result in a chronic condition.  Problems of the lower back that are caused by compensating for the injury, such as limping.  Pain or weakness of  the foot during push-off following surgery.  Chronic inflammation, scarring, and partial or complete fascia tear, occurring more often from repeated injections.   GENERAL TREATMENT CONSIDERATIONS   Treatment initially involves the use of ice and medication to help reduce pain and inflammation. The use of strengthening and stretching exercises may help reduce pain with activity, especially stretches of the Achilles tendon. These exercises may be performed at home or with a therapist. Your caregiver may recommend that you use heel cups of arch supports to help reduce stress on the plantar fascia. Occasionally, corticosteroid injections are given to reduce inflammation. If symptoms persist for greater than 6 months despite non-surgical  (conservative), then surgery may be recommended.     MEDICATION    If pain medication is necessary, then nonsteroidal anti-inflammatory medications, such as aspirin and ibuprofen, or other minor pain relievers, such as acetaminophen, are often recommended.    Do not take pain medication within 7 days before surgery.    Prescription pain relievers may be given if deemed necessary by your caregiver. Use only as directed and only as much as you need.  Corticosteroid injections may be given by your caregiver. These injections should be reserved for the most serious cases, because they may only be given a certain number of times.   HEAT AND COLD    Cold treatment (icing) relieves pain and reduces inflammation. Cold treatment should be applied for 10 to 15 minutes every 2 to 3 hours for inflammation and pain and immediately after any activity that aggravates your symptoms. Use ice packs or  massage the area with a piece of ice (ice massage).  Heat treatment may be used prior to performing the stretching and strengthening activities prescribed by your caregiver, physical therapist, or athletic trainer. Use a heat pack or soak the injury in warm water.   SEEK TREATMENT  IF:  Treatment seems to offer no benefit, or the condition worsens.  Any medications produce adverse side effects.   EXERCISES   RANGE OF MOTION AND STRETCHING EXERCISES - Plantar Fasciitis (Heel Spur Syndrome) These exercises may help you when beginning to rehabilitate your injury. Your symptoms may resolve with or without further involvement from your physician, physical therapist or athletic trainer. While completing these exercises, remember:      Restoring tissue flexibility helps normal motion to return to the joints. This allows healthier, less painful movement and activity.  An effective stretch should be held for at least 30 seconds.  A stretch should never be painful. You should only feel a gentle lengthening or release in the stretched tissue.      RANGE OF MOTION - Toe Extension, Flexion  Sit with your __________ leg crossed over your opposite knee.  Grasp your toes and gently pull them back toward the top of your foot. You should feel a stretch on the bottom of your toes and/or foot.    Hold this stretch for __________ seconds.    Now, gently pull your toes toward the bottom of your foot. You should feel a stretch on the top of your toes and or foot.  Hold this stretch for __________ seconds.   Repeat __________ times. Complete this stretch __________ times per day.       RANGE OF MOTION - Ankle Dorsiflexion, Active Assisted   Remove shoes and sit on a chair that is preferably not on a carpeted surface.    Place __________ foot under knee. Extend your opposite leg for support.  Keeping your heel down, slide your __________ foot back toward the chair until you feel a stretch at your ankle or calf. If you do not feel a stretch, slide your bottom forward to the edge of the chair, while still keeping your heel down.  Hold this stretch for __________ seconds.   Repeat __________ times. Complete this stretch __________ times per day.      STRETCH - Gastroc,  Standing   Place hands on wall.  Extend __________ leg, keeping the front knee somewhat bent.  Slightly point your toes inward on your back foot.  Keeping your __________ heel on the floor and your knee straight, shift your weight toward the wall, not allowing your back to arch.  You should feel a gentle stretch in the __________ calf. Hold this position for __________ seconds. Repeat __________ times. Complete this stretch __________ times per day.    STRETCH - Soleus, Standing   Place hands on wall.  Extend __________ leg, keeping the other knee somewhat bent.  Slightly point your toes inward on your back foot.  Keep your __________ heel on the floor, bend your back knee, and slightly shift your weight over the back leg so that you feel a gentle stretch deep in your back calf.    Hold this position for __________ seconds. Repeat __________ times. Complete this stretch __________ times per day.     STRETCH - Gastrocsoleus, Standing  Note: This exercise can place a lot of stress on your foot and ankle. Please complete this exercise only if specifically instructed by your caregiver.  Place the ball of your __________ foot on a step, keeping your other foot firmly on the same step.    Hold on to the wall or a rail for balance.  Slowly lift your other foot, allowing your body weight to press your heel down over the edge of the step.  You should feel a stretch in your __________ calf.  Hold this position for __________ seconds.  Repeat this exercise with a slight bend in your __________ knee.   Repeat __________ times. Complete this stretch __________ times per day.       STRENGTHENING EXERCISES - Plantar Fasciitis (Heel Spur Syndrome)  These exercises may help you when beginning to rehabilitate your injury. They may resolve your symptoms with or without further involvement from your physician, physical therapist or athletic trainer. While completing these exercises,  remember:    Muscles can gain both the endurance and the strength needed for everyday activities through controlled exercises.  Complete these exercises as instructed by your physician, physical therapist or athletic trainer.  Progress the resistance and repetitions only as guided.    STRENGTH - Towel Curls  Sit in a chair positioned on a non-carpeted surface.    Place your foot on a towel, keeping your heel on the floor.  Pull the towel toward your heel by only curling your toes. Keep your heel on the floor.  If instructed by your physician, physical therapist or athletic trainer, add ____________________ at the end of the towel. Repeat __________ times. Complete this exercise __________ times per day.     STRENGTH - Ankle Inversion   Secure one end of a rubber exercise band/tubing to a fixed object (table, pole). Loop the other end around your foot just before your toes.  Place your fists between your knees. This will focus your strengthening at your ankle.  Slowly, pull your big toe up and in, making sure the band/tubing is positioned to resist the entire motion.  Hold this position for __________ seconds.    Have your muscles resist the band/tubing as it slowly pulls your foot back to the starting position.   Repeat __________ times. Complete this exercises __________ times per day.     Document Released: 10/23/2005  Document Re-Released: 11/14/2009 [facility] Patient Information 2011 [place], [place].

## 2011-08-24 ENCOUNTER — Encounter: Payer: Self-pay | Admitting: *Deleted

## 2011-08-24 ENCOUNTER — Encounter: Payer: Self-pay | Admitting: Family Medicine

## 2011-08-24 NOTE — Assessment & Plan Note (Signed)
Reviewed home PT exercises. Letter to work written. Handout given. Follow up PRN.

## 2011-08-24 NOTE — Progress Notes (Signed)
Deanna Scott has an old diagnosis of BL planter fasciitis. She has been managed with PT, Custom orthotics, and behavior management. 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 However recently she has been standing and two weeks ago her feet and heels started to hurt again. She would like a note for work to allow her to sit while on the job.   Additionally she has an old history of uterine prolapse and some urinary incontinence. She has what appears to have been a bladder sling. She did well but recently noted some small bladder dibbling.   PMH reviewed.  ROS as above otherwise neg Medications reviewed.  Exam:  BP 105/64  Pulse 76  Ht 5\' 2"  (1.575 m)  Wt 213 lb (96.616 kg)  BMI 38.96 kg/m2 Gen: Well NAD MSK: Feet are normal appearing. Non-tender over heels. No calluses. Has custom orthotics in both shoes.

## 2011-08-24 NOTE — Assessment & Plan Note (Signed)
Recommended that she call GYN and schedule an appointment. She may benefit from a pessary.

## 2011-09-06 ENCOUNTER — Telehealth: Payer: Self-pay | Admitting: *Deleted

## 2011-09-06 NOTE — Telephone Encounter (Signed)
Called patient to notify her that her orange card expired today and she will need to re-new it in order to process referral. She said she will be renewing it Friday. I will go ahead and schedule the appointment at [facility] and call her back with the appointment.Earvin Blazier, Rodena Medin

## 2011-09-07 NOTE — Telephone Encounter (Signed)
Pt called to let us know she will be going to see D. Hill tomorrow to renew orange card, pt is requesting to push her appt out, anytime after Dec 7.

## 2011-09-07 NOTE — Telephone Encounter (Signed)
I [facility], they will contact patient with appointment and she may reschedule at that time if she needs to.Busick, Rodena Medin

## 2011-09-12 ENCOUNTER — Telehealth: Payer: Self-pay | Admitting: *Deleted

## 2011-09-12 NOTE — Telephone Encounter (Signed)
Please tell pt: Appt at [facility] 11/02/11 at 3 pm. Arrive 10 minutes before appt. Bring picture ID and insurance card. Medication list. Waiting for call back. Deanna Scott, Deanna Scott

## 2011-09-14 NOTE — Telephone Encounter (Signed)
Called pt again. Left message to call back. See appt info. Deanna Scott, Deanna Scott

## 2011-09-18 NOTE — Telephone Encounter (Signed)
Called pt again to return call. Re; appt at [facility]. See message. Lorenda Hatchet, Renato Battles

## 2011-09-19 NOTE — Telephone Encounter (Signed)
Pt never called back. Mailed appt info to pt. Lorenda Hatchet, Renato Battles

## 2011-11-02 ENCOUNTER — Encounter: Payer: Self-pay | Admitting: Obstetrics and Gynecology

## 2011-11-02 ENCOUNTER — Ambulatory Visit (INDEPENDENT_AMBULATORY_CARE_PROVIDER_SITE_OTHER): Payer: Self-pay | Admitting: Obstetrics and Gynecology

## 2011-11-02 DIAGNOSIS — Z01419 Encounter for gynecological examination (general) (routine) without abnormal findings: Secondary | ICD-10-CM

## 2011-11-02 DIAGNOSIS — Z1231 Encounter for screening mammogram for malignant neoplasm of breast: Secondary | ICD-10-CM

## 2011-11-02 DIAGNOSIS — R350 Frequency of micturition: Secondary | ICD-10-CM

## 2011-11-02 DIAGNOSIS — E669 Obesity, unspecified: Secondary | ICD-10-CM

## 2011-11-02 NOTE — Progress Notes (Signed)
  Subjective:    Patient ID: Deanna Scott, female    DOB: [birth year], 40 y.o.   MRN: [id number]  HPI  40 yo [id number] with LMP 12/24 presenting today as a referral from [facility] for evaluation of urinary incontinence. Patient reports having a sling procedure done 3 years ago with good success but 2 weeks ago noted a wet underwear and was unaware that she had urinated on herself. Patient denies incontinence with valsalva, laughing, coughing. Patient has been performing her kegel exercises since the surgery. Patient is also now complaining of frequency and nocturia. Patient also reports feeling a bulge in her vagina and is concerned of uterine prolapse as this had happened to her mother. Patient states that she feels the bulge at times when taking a shower. Patient denies any pelvic pressure or pain. Patient is otherwise doing well, without complaints. Patient is not sexual active  Review of Systems  All other systems reviewed and are negative.       Objective:   Physical Exam  GENERAL: Well-developed, well-nourished female in no acute distress.  HEENT: Normocephalic, atraumatic. Sclerae anicteric.  NECK: Supple. Normal thyroid.  LUNGS: Clear to auscultation bilaterally.  HEART: Regular rate and rhythm. BREASTS: Symmetric in size. No palpable masses or lymphadenopathy, skin changes, or nipple drainage. ABDOMEN: Soft, nontender, nondistended. No organomegaly. PELVIC: Normal external female genitalia. Vagina is pink and rugated.  Normal discharge. Normal appearing cervix. Uterus is normal in size. No adnexal mass or tenderness. Good pelvic tone. No prolapse visualized or appreciated on exam in supine or standing position with valsalva EXTREMITIES: No cyanosis, clubbing, or edema, 2+ distal pulses.     Assessment & Plan:  40 yo [phone] here for evaluation of urinary incontinence - pap smear was performed  - referral for screening mammogram provided - will send a urine culture - Patient asked  to keep a diary of her incontinence and her prolapse - RTC in 1 month. Patient was counseled on medical or surgical management of uterine prolapse and is more interested in a hysterectomy if needed.

## 2011-11-02 NOTE — Progress Notes (Signed)
Addended by: Catalina Antigua on: 11/02/2011 03:42 PM   Modules accepted: Orders

## 2011-11-04 LAB — URINE CULTURE: Colony Count: NO GROWTH

## 2011-11-09 ENCOUNTER — Ambulatory Visit (HOSPITAL_COMMUNITY)
Admission: RE | Admit: 2011-11-09 | Discharge: 2011-11-09 | Disposition: A | Discharge status: Home / Self Care | Payer: Self-pay | Source: Ambulatory Visit | Attending: Obstetrics and Gynecology | Admitting: Obstetrics and Gynecology

## 2011-11-09 DIAGNOSIS — Z1231 Encounter for screening mammogram for malignant neoplasm of breast: Secondary | ICD-10-CM | POA: Insufficient documentation

## 2011-11-30 ENCOUNTER — Ambulatory Visit: Payer: Self-pay | Admitting: Obstetrics and Gynecology

## 2011-12-21 ENCOUNTER — Ambulatory Visit (INDEPENDENT_AMBULATORY_CARE_PROVIDER_SITE_OTHER): Payer: Self-pay | Admitting: Obstetrics and Gynecology

## 2011-12-21 ENCOUNTER — Encounter: Payer: Self-pay | Admitting: Obstetrics and Gynecology

## 2011-12-21 VITALS — BP 123/88 | HR 74 | Temp 98.6°F | Ht 61.75 in | Wt 212.2 lb

## 2011-12-21 DIAGNOSIS — N814 Uterovaginal prolapse, unspecified: Secondary | ICD-10-CM

## 2011-12-21 NOTE — Progress Notes (Addendum)
41 yo [id number] presenting today as a follow-up on her uterine prolapse. Patient reports that she has had one episode of incontinence since December 2012. Patient presents today reporting feeling the prolapse and desires interventions  GENERAL: Well-developed, well-nourished female in no acute distress.  ABDOMEN: Soft, nontender, nondistended. No organomegaly. PELVIC: Normal external female genitalia. Vagina is pink and rugated.  Normal discharge. Normal appearing cervix. Uterus is normal in size. No adnexal mass or tenderness. Uterine prolapse appreciated to the level of introitus. EXTREMITIES: No cyanosis, clubbing, or edema, 2+ distal pulses.  A/P 41 yo with uterine prolapse - Patient counseled on medical management with pessary vs surgical management with hysterectomy. Patient opted for vaginal hysterectomy. Risk, benefits and alternatives were explained including but not limited to risk of bleeding, infection and damage to adjacent organs. Patient chose to preserve her ovaries - Patient desires the surgery to be schedule after 5/7 as she would be on summer break

## 2011-12-25 ENCOUNTER — Encounter (HOSPITAL_COMMUNITY): Payer: Self-pay | Admitting: *Deleted

## 2012-02-19 ENCOUNTER — Encounter: Payer: Self-pay | Admitting: Family Medicine

## 2012-02-19 ENCOUNTER — Other Ambulatory Visit (HOSPITAL_COMMUNITY)
Admission: RE | Admit: 2012-02-19 | Discharge: 2012-02-19 | Disposition: A | Discharge status: Home / Self Care | Payer: Self-pay | Source: Ambulatory Visit | Attending: Family Medicine | Admitting: Family Medicine

## 2012-02-19 ENCOUNTER — Ambulatory Visit (INDEPENDENT_AMBULATORY_CARE_PROVIDER_SITE_OTHER): Payer: Self-pay | Admitting: Family Medicine

## 2012-02-19 VITALS — BP 133/94 | HR 73 | Temp 97.2°F | Ht 61.75 in | Wt 213.5 lb

## 2012-02-19 DIAGNOSIS — N898 Other specified noninflammatory disorders of vagina: Secondary | ICD-10-CM

## 2012-02-19 DIAGNOSIS — Z202 Contact with and (suspected) exposure to infections with a predominantly sexual mode of transmission: Secondary | ICD-10-CM | POA: Insufficient documentation

## 2012-02-19 DIAGNOSIS — Z113 Encounter for screening for infections with a predominantly sexual mode of transmission: Secondary | ICD-10-CM | POA: Insufficient documentation

## 2012-02-19 LAB — POCT WET PREP (WET MOUNT)

## 2012-02-19 NOTE — Assessment & Plan Note (Signed)
No BV on wet prep today.  GC/Chlamydia pending

## 2012-02-19 NOTE — Patient Instructions (Signed)
Will call you if infection that needs to be treated  Will send you a letter with your bloodwork if everything is normal  Keep in mind that you should HIV test is not positive sometimes for the first 4-8 weeks

## 2012-02-19 NOTE — Assessment & Plan Note (Signed)
Advised condoms always- RPR, HIV also done today

## 2012-02-19 NOTE — Progress Notes (Signed)
  Subjective:    Patient ID: Deanna Scott, female    DOB: 02-21-1971, 41 y.o.   MRN: [id number]  HPI 41 yo here to evaluate for STD  Had unprotected consensual sex several days ago- had noted some increased vaginal discharge- would like to screen for STD's.  No abdominal pain, dysuria, urinary frequency, genital sores.  Review of Systems See HPI    Objective:   Physical Exam GEN: Alert & Oriented, No acute distress Pelvic Exam:        External: normal female genitalia without lesions or masses        Vagina: normal without lesions or masses        Cervix: normal without lesions or masses        Adnexa: normal bimanual exam without masses or fullness        Uterus: normal by palpation        Samples for Wet prep, GC/Chlamydia obtained         Assessment & Plan:

## 2012-02-20 ENCOUNTER — Encounter: Payer: Self-pay | Admitting: Family Medicine

## 2012-02-22 ENCOUNTER — Encounter (HOSPITAL_COMMUNITY): Payer: Self-pay | Admitting: Pharmacist

## 2012-02-22 ENCOUNTER — Ambulatory Visit: Payer: Self-pay | Admitting: Family Medicine

## 2012-03-04 ENCOUNTER — Ambulatory Visit (INDEPENDENT_AMBULATORY_CARE_PROVIDER_SITE_OTHER): Payer: Self-pay | Admitting: Family Medicine

## 2012-03-04 ENCOUNTER — Encounter: Payer: Self-pay | Admitting: Family Medicine

## 2012-03-04 VITALS — BP 127/87 | HR 74 | Temp 98.1°F | Ht 61.75 in | Wt 212.1 lb

## 2012-03-04 DIAGNOSIS — R1110 Vomiting, unspecified: Secondary | ICD-10-CM | POA: Insufficient documentation

## 2012-03-04 NOTE — Progress Notes (Signed)
  Subjective:    Patient ID: Deanna Scott, female    DOB: Mar 13, 1971, 41 y.o.   MRN: [id number]  HPI 1. Emesis. Patient experienced vomiting immediately after eating a few bites of a "day-old" tuna sandwich. She felt well before this and feels well now. She was informed by her employer she would require a note prior to returning to work tomorrow, so she came straight to this office.  She only vomited the food which she had previously eaten, no blood or bile was noted. While driving over, she was able to drink water without experiencing any symptoms. She is not experiencing any nausea, pain, fever, or chills. She denies dysuria and diarrhea.  Review of Systems See HPI, otherwise no symptoms noted.    Objective:   Physical Exam  Constitutional: She is oriented to person, place, and time. She appears well-developed and well-nourished. No distress.  HENT:  Head: Normocephalic.  Eyes: EOM are normal. Pupils are equal, round, and reactive to light. Right eye exhibits no discharge. Left eye exhibits no discharge. No scleral icterus.  Abdominal: Soft. Bowel sounds are normal. She exhibits no distension and no mass. There is no tenderness. There is no rebound and no guarding.  Neurological: She is alert and oriented to person, place, and time.  Skin: She is not diaphoretic.  Psychiatric: She has a normal mood and affect. Her behavior is normal. Judgment and thought content normal.       Assessment & Plan:

## 2012-03-04 NOTE — Patient Instructions (Signed)
Nice to meet you. You most likely had a bad sandwich. If you have any fever, pain, cant keep down liquids or any concerns then see a doctor. You are safe to work tomorrow. Good luck with your procedure!!

## 2012-03-04 NOTE — Assessment & Plan Note (Signed)
Seems related to reaction from old tuna. No red flag signs: pain, fever, nausea. She feels well and has tolerated liquids. Very low liklihood this is viral gastroenteritis. 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 F/u if worsens or prn.

## 2012-03-11 NOTE — H&P (Signed)
Deanna Scott is an 41 y.o. female [phone] with uterine prolapse presenting today for scheduled hysterectomy. Patient is doing well and without any complaints. Patient with h/o urinary incontinence s/p suburethral sling placement 3 years ago. Patient denies any recent h/o urinary incontince.  Pertinent Gynecological History: Menses: flow is moderate Bleeding: monthly Contraception: tubal ligation DES exposure: denies Blood transfusions: none Sexually transmitted diseases: no past history Previous GYN Procedures: n/a  Last mammogram: normal Date: 11/2011 Last pap: normal Date: 10/2012 OB History: G4, P2022   Menstrual History: No LMP recorded.    Past Medical History  Diagnosis Date  . Allergy     latex  . Abnormal Pap smear   . Seasonal allergies   . Shortness of breath     with exercise - smoker  . Migraines     otc meds prn  . Shoulder pain, right     otc meds  . Plantar fasciitis, bilateral     Past Surgical History  Procedure Date  . Bladder suspension 2009  . Tubal ligation   . Svd     x 2  . Dilation and curettage of uterus     hx mab  . Diagnostic laparoscopy     ectopic pregnancy    Family History  Problem Relation Age of Onset  . Cancer Father     stomach  . Diabetes Maternal Grandmother     Social History:  reports that she has been smoking Cigarettes.  She has a 29 pack-year smoking history. She has never used smokeless tobacco. She reports that she drinks about one ounce of alcohol per week. She reports that she does not use illicit drugs.  Allergies:  Allergies  Allergen Reactions  . Latex Itching and Dermatitis    Prescriptions prior to admission  Medication Sig Dispense Refill  . aspirin-acetaminophen-caffeine (EXCEDRIN MIGRAINE) 250-250-65 MG per tablet Take 1 tablet by mouth every 6 (six) hours as needed.      Marland Kitchen ibuprofen (ADVIL,MOTRIN) 800 MG tablet Take 800 mg by mouth every 8 (eight) hours as needed. For shoulder pain      .  Menthol-Methyl Salicylate (MUSCLE RUB) 10-15 % CREA Apply topically as needed. Crista Elliot      . OVER THE COUNTER MEDICATION Take 1 tablet by mouth daily as needed. Pt takes 1 tablet daily for allergy relief. Pt is unsure of the main ingredient.      . Pediatric Multiple Vit-C-FA (MULTIVITAMIN ANIMAL SHAPES, WITH CA/FA,) WITH C & FA CHEW Chew 1 tablet by mouth daily.      . vitamin B-12 (CYANOCOBALAMIN) 100 MCG tablet Take 300 mcg by mouth daily.        Review of Systems  All other systems reviewed and are negative.    Blood pressure 124/82, pulse 81, temperature 98.8 F (37.1 C), temperature source Oral, resp. rate 18, SpO2 99.00%. Physical Exam GENERAL: Well-developed, well-nourished female in no acute distress.  HEENT: Normocephalic, atraumatic. Sclerae anicteric.  NECK: Supple. Normal thyroid.  LUNGS: Clear to auscultation bilaterally.  HEART: Regular rate and rhythm. ABDOMEN: Soft, nontender, nondistended. No organomegaly. PELVIC: Normal external female genitalia. Vagina is pink and rugated.  Normal discharge. Normal appearing cervix. Uterus is normal in size.  No adnexal mass or tenderness. Grade 2 uterine prolapse EXTREMITIES: No cyanosis, clubbing, or edema, 2+ distal pulses.   Results for orders placed during the hospital encounter of 03/12/12 (from the past 24 hour(s))  SURGICAL PCR SCREEN     Status: Normal  Collection Time   03/12/12  2:50 PM      Component Value Range   MRSA, PCR NEGATIVE  NEGATIVE    Staphylococcus aureus NEGATIVE  NEGATIVE   CBC     Status: Abnormal   Collection Time   03/12/12  3:05 PM      Component Value Range   WBC 14.6 (*) 4.0 - 10.5 (K/uL)   RBC 4.55  3.87 - 5.11 (MIL/uL)   Hemoglobin 13.5  12.0 - 15.0 (g/dL)   HCT 16.1  09.6 - 04.5 (%)   MCV 92.1  78.0 - 100.0 (fL)   MCH 29.7  26.0 - 34.0 (pg)   MCHC 32.2  30.0 - 36.0 (g/dL)   RDW 40.9  81.1 - 91.4 (%)   Platelets 259  150 - 400 (K/uL)    No results found.  Assessment/Plan: 41 yo  [id number] with uterine prolapse for scheduled hysterectomy - Risk benefits and alternatives explained including but not limited to risk of bleeding, infection and damage to adjacent organs. Patient verbalized understanding and all questions were answered.  Quaniyah Bugh 03/13/2012, 8:19 AM

## 2012-03-11 NOTE — Op Note (Signed)
Deanna Scott PROCEDURE DATE: 03/13/2012  PREOPERATIVE DIAGNOSIS:  Symptomatic fibroids, menorrhagia POSTOPERATIVE DIAGNOSIS:  Symptomatic fibroids, menorrhagia SURGEON:   Deanna Scott, M.D. ASSISTANT: Deanna Scott, M.D. OPERATION:  Total Vaginal hysterectomy ANESTHESIA:  General endotracheal.  INDICATIONS: The patient is a 41 y.o. [id number] with history of uterine prolapse. The patient made a decision to undergo definite surgical treatment. On the preoperative visit, the risks, benefits, indications, and alternatives of the procedure were reviewed with the patient.  On the day of surgery, the risks of surgery were again discussed with the patient including but not limited to: bleeding which may require transfusion or reoperation; infection which may require antibiotics; injury to bowel, bladder, ureters or other surrounding organs; need for additional procedures; thromboembolic phenomenon, incisional problems and other postoperative/anesthesia complications. Written informed consent was obtained.    OPERATIVE FINDINGS: An 8-week size uterus with normal tubes and ovaries bilaterally.  ESTIMATED BLOOD LOSS: 100 ml FLUIDS:  1500 ml of Lactated Ringers URINE OUTPUT:  300 ml of clear yellow urine. SPECIMENS:  Uterus and cervix sent to pathology COMPLICATIONS:  None immediate.  DESCRIPTION OF PROCEDURE:  The patient received intravenous antibiotics and had sequential compression devices applied to her lower extremities while in the preoperative area.  She was then taken to the operating room where general anesthesia was administered and was found to be adequate.  She was placed in the dorsal lithotomy position, and was prepped and draped in a sterile manner.  A Foley catheter was inserted into her bladder and attached to Deanna Scott drainage. After an adequate timeout was performed, attention was turned to her pelvis.  A weighted speculum was then placed in the vagina, and the anterior and posterior lips  of the cervix were grasped bilaterally with tenaculums.  The cervix was then injected circumferentially with normal saline solution to maintain hemostasis.  The cervix was then circumferentially incised, and the bladder was dissected off the pubocervical fascia anteriorly without complication.  The anterior cul-de-sac was then entered sharply without difficulty and a retractor was placed.  The same procedure was performed posteriorly and the posterior cul-de-sac was entered sharply without difficulty.  A long weighted speculum was inserted into the posterior cul-de-sac.  The Heaney clamp was then used to clamp the uterosacral ligaments on either side.  They were then cut and sutured ligated with 0 Vicryl, and the ligated uterosacral ligaments were transfixed to the posterior lateral vaginal epithelium to further support the vagina and provide hemostasis. Of note, all sutures used in this case were 0 Vicryl unless otherwise noted.   The cardinal ligaments were then clamped, cut and ligated. The uterine arteries and broad ligament were then serially clamped with the Heaney ligasure clamps, cut, and suture ligated on both sides.  Excellent hemostasis was noted at this point.  The uterus was then delivered via the posterior cul-de-sac, and the cornua were clamped with the Heaney clamps, transected, and the uterus was delivered and sent to pathology. These pedicles were then suture ligated to ensure hemostasis.  After completion of the hysterectomy, all pedicles from the uterosacral ligament to the cornua were examined and hemostasis was confirmed.  The vaginal cuff was then closed with 0 Vicryl in a running locked fashion with care given to incorporate the uterosacral pedicles bilaterally.  All instruments were then removed from the pelvis and a vaginal packing saturated with estrogen cream was placed.  The patient tolerated the procedure well.  All instruments, needles, and sponge counts were correct x 2. The patient  was taken to the recovery room in stable condition.

## 2012-03-12 ENCOUNTER — Encounter (HOSPITAL_COMMUNITY): Payer: Self-pay

## 2012-03-12 ENCOUNTER — Encounter (HOSPITAL_COMMUNITY)
Admission: RE | Admit: 2012-03-12 | Discharge: 2012-03-12 | Disposition: A | Discharge status: Home / Self Care | Payer: Self-pay | Source: Ambulatory Visit | Attending: Obstetrics and Gynecology | Admitting: Obstetrics and Gynecology

## 2012-03-12 HISTORY — DX: Plantar fascial fibromatosis: M72.2

## 2012-03-12 HISTORY — DX: Pain in right shoulder: M25.511

## 2012-03-12 HISTORY — DX: Shortness of breath: R06.02

## 2012-03-12 HISTORY — DX: Other seasonal allergic rhinitis: J30.2

## 2012-03-12 LAB — CBC
HCT: 41.9 % (ref 36.0–46.0)
Hemoglobin: 13.5 g/dL (ref 12.0–15.0)
MCH: 29.7 pg (ref 26.0–34.0)
MCHC: 32.2 g/dL (ref 30.0–36.0)
MCV: 92.1 fL (ref 78.0–100.0)
RDW: 14.7 % (ref 11.5–15.5)

## 2012-03-12 LAB — SURGICAL PCR SCREEN: Staphylococcus aureus: NEGATIVE

## 2012-03-12 MED ORDER — CEFAZOLIN SODIUM-DEXTROSE 2-3 GM-% IV SOLR
2.0000 g | INTRAVENOUS | Status: AC
  Administered 2012-03-13: 2 g via INTRAVENOUS
  Filled 2012-03-12: qty 50

## 2012-03-12 NOTE — Patient Instructions (Addendum)
   Your procedure is scheduled on: Wednesday, May 8th   Enter through the Main Entrance of [facility] at: 7:00am Pick up the phone at the desk and dial [phone] and inform us of your arrival.  Please call this number if you have any problems the morning of surgery: [phone]  Remember: Do not eat food after midnight: Tuesday Do not drink clear liquids after: Tuesday Take these medicines the morning of surgery with a SIP OF WATER: None  Do not wear jewelry, make-up, or FINGER nail polish Do not wear lotions, powders, perfumes or deodorant. Do not shave 48 hours prior to surgery. Do not bring valuables to the hospital. Contacts, dentures or bridgework may not be worn into surgery.  Leave suitcase in the car. After Surgery it may be brought to your room. For patients being admitted to the hospital, checkout time is 11:00am the day of discharge.  Home with daughter Martika Egler cell [phone].  Patients discharged on the day of surgery will not be allowed to drive home.     Remember to use your hibiclens as instructed.Please shower with 1/2 bottle the evening before your surgery and the other 1/2 bottle the morning of surgery. Neck down avoiding private area.

## 2012-03-13 ENCOUNTER — Encounter (HOSPITAL_COMMUNITY): Payer: Self-pay | Admitting: Anesthesiology

## 2012-03-13 ENCOUNTER — Encounter (HOSPITAL_COMMUNITY)
Admission: RE | Disposition: A | Discharge status: Home / Self Care | Payer: Self-pay | Source: Ambulatory Visit | Attending: Obstetrics and Gynecology

## 2012-03-13 ENCOUNTER — Inpatient Hospital Stay (HOSPITAL_COMMUNITY)
Admission: RE | Admit: 2012-03-13 | Discharge: 2012-03-14 | DRG: 743 | Disposition: A | Discharge status: Home / Self Care | Payer: Self-pay | Source: Ambulatory Visit | Attending: Obstetrics and Gynecology | Admitting: Obstetrics and Gynecology

## 2012-03-13 ENCOUNTER — Ambulatory Visit (HOSPITAL_COMMUNITY): Payer: Self-pay | Admitting: Anesthesiology

## 2012-03-13 ENCOUNTER — Encounter (HOSPITAL_COMMUNITY): Payer: Self-pay | Admitting: *Deleted

## 2012-03-13 DIAGNOSIS — Z01818 Encounter for other preprocedural examination: Secondary | ICD-10-CM

## 2012-03-13 DIAGNOSIS — Z01812 Encounter for preprocedural laboratory examination: Secondary | ICD-10-CM

## 2012-03-13 DIAGNOSIS — D259 Leiomyoma of uterus, unspecified: Secondary | ICD-10-CM | POA: Diagnosis present

## 2012-03-13 DIAGNOSIS — N8000 Endometriosis of the uterus, unspecified: Secondary | ICD-10-CM | POA: Diagnosis present

## 2012-03-13 DIAGNOSIS — N814 Uterovaginal prolapse, unspecified: Secondary | ICD-10-CM | POA: Diagnosis present

## 2012-03-13 DIAGNOSIS — N920 Excessive and frequent menstruation with regular cycle: Principal | ICD-10-CM | POA: Diagnosis present

## 2012-03-13 HISTORY — PX: VAGINAL HYSTERECTOMY: SHX2639

## 2012-03-13 SURGERY — HYSTERECTOMY, VAGINAL
Anesthesia: General | Site: Vagina | Wound class: Clean Contaminated

## 2012-03-13 MED ORDER — HYDROMORPHONE HCL PF 1 MG/ML IJ SOLN
INTRAMUSCULAR | Status: AC
  Administered 2012-03-13: 0.5 mg via INTRAVENOUS
  Filled 2012-03-13: qty 1

## 2012-03-13 MED ORDER — DEXAMETHASONE SODIUM PHOSPHATE 10 MG/ML IJ SOLN
INTRAMUSCULAR | Status: AC
  Filled 2012-03-13: qty 1

## 2012-03-13 MED ORDER — NEOSTIGMINE METHYLSULFATE 1 MG/ML IJ SOLN
INTRAMUSCULAR | Status: DC | PRN
  Administered 2012-03-13: 3 mg via INTRAVENOUS

## 2012-03-13 MED ORDER — ESTRADIOL 0.1 MG/GM VA CREA
TOPICAL_CREAM | VAGINAL | Status: DC | PRN
  Administered 2012-03-13: 1 via VAGINAL

## 2012-03-13 MED ORDER — GLYCOPYRROLATE 0.2 MG/ML IJ SOLN
INTRAMUSCULAR | Status: DC | PRN
  Administered 2012-03-13: 0.1 mg via INTRAVENOUS
  Administered 2012-03-13: .6 mg via INTRAVENOUS

## 2012-03-13 MED ORDER — ONDANSETRON HCL 4 MG/2ML IJ SOLN
INTRAMUSCULAR | Status: AC
  Filled 2012-03-13: qty 2

## 2012-03-13 MED ORDER — GLYCOPYRROLATE 0.2 MG/ML IJ SOLN
INTRAMUSCULAR | Status: AC
  Filled 2012-03-13: qty 2

## 2012-03-13 MED ORDER — MENTHOL 3 MG MT LOZG: 1.0000 | LOZENGE | OROMUCOSAL | Status: DC | PRN

## 2012-03-13 MED ORDER — MUSCLE RUB 10-15 % EX CREA
TOPICAL_CREAM | CUTANEOUS | Status: DC | PRN
  Filled 2012-03-13: qty 85

## 2012-03-13 MED ORDER — PROPOFOL 10 MG/ML IV EMUL
INTRAVENOUS | Status: DC | PRN
  Administered 2012-03-13: 150 mg via INTRAVENOUS

## 2012-03-13 MED ORDER — LACTATED RINGERS IV SOLN
INTRAVENOUS | Status: DC
  Administered 2012-03-13: 09:00:00 via INTRAVENOUS
  Administered 2012-03-13: 125 mL/h via INTRAVENOUS

## 2012-03-13 MED ORDER — LIDOCAINE HCL (CARDIAC) 20 MG/ML IV SOLN
INTRAVENOUS | Status: AC
  Filled 2012-03-13: qty 5

## 2012-03-13 MED ORDER — FENTANYL CITRATE 0.05 MG/ML IJ SOLN
INTRAMUSCULAR | Status: AC
  Filled 2012-03-13: qty 5

## 2012-03-13 MED ORDER — HYDROMORPHONE HCL PF 1 MG/ML IJ SOLN
0.2500 mg | INTRAMUSCULAR | Status: DC | PRN
  Administered 2012-03-13 (×2): 0.5 mg via INTRAVENOUS

## 2012-03-13 MED ORDER — SODIUM CHLORIDE 0.9 % IJ SOLN
INTRAMUSCULAR | Status: DC | PRN
  Administered 2012-03-13: 20 mL via INTRAVENOUS

## 2012-03-13 MED ORDER — ROCURONIUM BROMIDE 50 MG/5ML IV SOLN
INTRAVENOUS | Status: AC
  Filled 2012-03-13: qty 1

## 2012-03-13 MED ORDER — FENTANYL CITRATE 0.05 MG/ML IJ SOLN
INTRAMUSCULAR | Status: DC | PRN
  Administered 2012-03-13: 50 ug via INTRAVENOUS
  Administered 2012-03-13: 100 ug via INTRAVENOUS
  Administered 2012-03-13 (×2): 50 ug via INTRAVENOUS

## 2012-03-13 MED ORDER — GRX ANALGESIC BALM EX OINT
1.0000 "application " | TOPICAL_OINTMENT | CUTANEOUS | Status: DC | PRN
  Filled 2012-03-13: qty 28

## 2012-03-13 MED ORDER — ESTRADIOL 0.1 MG/GM VA CREA
TOPICAL_CREAM | VAGINAL | Status: AC
  Filled 2012-03-13: qty 42.5

## 2012-03-13 MED ORDER — MIDAZOLAM HCL 5 MG/5ML IJ SOLN
INTRAMUSCULAR | Status: DC | PRN
  Administered 2012-03-13: 2 mg via INTRAVENOUS

## 2012-03-13 MED ORDER — ONDANSETRON HCL 4 MG/2ML IJ SOLN
INTRAMUSCULAR | Status: DC | PRN
  Administered 2012-03-13: 4 mg via INTRAVENOUS

## 2012-03-13 MED ORDER — DEXAMETHASONE SODIUM PHOSPHATE 10 MG/ML IJ SOLN
INTRAMUSCULAR | Status: DC | PRN
  Administered 2012-03-13: 10 mg via INTRAVENOUS

## 2012-03-13 MED ORDER — LACTATED RINGERS IV SOLN
INTRAVENOUS | Status: DC
  Administered 2012-03-13 – 2012-03-14 (×2): via INTRAVENOUS

## 2012-03-13 MED ORDER — ROCURONIUM BROMIDE 100 MG/10ML IV SOLN
INTRAVENOUS | Status: DC | PRN
  Administered 2012-03-13: 40 mg via INTRAVENOUS

## 2012-03-13 MED ORDER — OXYCODONE-ACETAMINOPHEN 5-325 MG PO TABS
1.0000 | ORAL_TABLET | ORAL | Status: DC | PRN
  Administered 2012-03-13 – 2012-03-14 (×3): 2 via ORAL
  Filled 2012-03-13 (×3): qty 2

## 2012-03-13 MED ORDER — MIDAZOLAM HCL 2 MG/2ML IJ SOLN
INTRAMUSCULAR | Status: AC
  Filled 2012-03-13: qty 2

## 2012-03-13 MED ORDER — MUSCLE RUB 10-15 % EX CREA: TOPICAL_CREAM | CUTANEOUS | Status: DC | PRN

## 2012-03-13 MED ORDER — NEOSTIGMINE METHYLSULFATE 1 MG/ML IJ SOLN
INTRAMUSCULAR | Status: AC
  Filled 2012-03-13: qty 10

## 2012-03-13 MED ORDER — LIDOCAINE HCL (CARDIAC) 20 MG/ML IV SOLN
INTRAVENOUS | Status: DC | PRN
  Administered 2012-03-13: 60 mg via INTRAVENOUS

## 2012-03-13 MED ORDER — KETOROLAC TROMETHAMINE 30 MG/ML IJ SOLN
INTRAMUSCULAR | Status: DC | PRN
  Administered 2012-03-13: 30 mg via INTRAVENOUS

## 2012-03-13 MED ORDER — IBUPROFEN 600 MG PO TABS
600.0000 mg | ORAL_TABLET | Freq: Four times a day (QID) | ORAL | Status: DC | PRN
  Administered 2012-03-14: 600 mg via ORAL
  Filled 2012-03-13: qty 1

## 2012-03-13 MED ORDER — PROPOFOL 10 MG/ML IV EMUL
INTRAVENOUS | Status: AC
  Filled 2012-03-13: qty 20

## 2012-03-13 MED ORDER — KETOROLAC TROMETHAMINE 30 MG/ML IJ SOLN: 15.0000 mg | Freq: Once | INTRAMUSCULAR | Status: DC | PRN

## 2012-03-13 SURGICAL SUPPLY — 23 items
CANISTER SUCTION 2500CC (MISCELLANEOUS) ×2 IMPLANT
CONT PATH 16OZ SNAP LID 3702 (MISCELLANEOUS) ×2 IMPLANT
DECANTER SPIKE VIAL GLASS SM (MISCELLANEOUS) IMPLANT
ELECT LIGASURE SHORT 9 REUSE (ELECTRODE) ×2 IMPLANT
GAUZE PACKING 1 X5 YD ST (GAUZE/BANDAGES/DRESSINGS) ×2 IMPLANT
GAUZE PACKING 2X5 YD STERILE (GAUZE/BANDAGES/DRESSINGS) ×2 IMPLANT
GLOVE BIOGEL PI IND STRL 6.5 (GLOVE) ×7 IMPLANT
GLOVE BIOGEL PI INDICATOR 6.5 (GLOVE) ×7
GLOVE NEODERM STER SZ 7 (GLOVE) ×2 IMPLANT
GLOVE SURG SS PI 6.0 STRL IVOR (GLOVE) ×4 IMPLANT
GLOVE SURG SS PI 7.0 STRL IVOR (GLOVE) ×2 IMPLANT
GOWN PREVENTION PLUS LG XLONG (DISPOSABLE) ×8 IMPLANT
GOWN STRL REIN XL XLG (GOWN DISPOSABLE) ×2 IMPLANT
NS IRRIG 1000ML POUR BTL (IV SOLUTION) ×2 IMPLANT
PACK VAGINAL WOMENS (CUSTOM PROCEDURE TRAY) ×2 IMPLANT
SUT VIC AB 0 CT1 18XCR BRD8 (SUTURE) IMPLANT
SUT VIC AB 0 CT1 36 (SUTURE) ×4 IMPLANT
SUT VIC AB 0 CT1 8-18 (SUTURE)
SUT VICRYL 0 TIES 12 18 (SUTURE) IMPLANT
TOWEL OR 17X24 6PK STRL BLUE (TOWEL DISPOSABLE) ×4 IMPLANT
TRAY FOLEY BAG SILVER LF 14FR (CATHETERS) ×2 IMPLANT
TRAY FOLEY CATH 14FR (SET/KITS/TRAYS/PACK) IMPLANT
WATER STERILE IRR 1000ML POUR (IV SOLUTION) ×2 IMPLANT

## 2012-03-13 NOTE — Addendum Note (Signed)
Addendum  created 03/13/12 1630 by Algis Greenhouse, CRNA   Modules edited:Notes Section

## 2012-03-13 NOTE — Anesthesia Postprocedure Evaluation (Deleted)
Anesthesia Post Note  Patient: Deanna Scott  Procedure(s) Performed: Procedure(s) (LRB): HYSTERECTOMY VAGINAL (N/A)  Anesthesia type: General  Patient location: Mother/Baby  Post pain: Pain level controlled  Post assessment: Post-op Vital signs reviewed  Last Vitals:  Filed Vitals:   03/13/12 1559  BP: 111/77  Pulse: 73  Temp: 36.9 C  Resp: 18    Post vital signs: Reviewed and stable  Level of consciousness: sedated  Complications: No apparent anesthesia complications

## 2012-03-13 NOTE — Anesthesia Procedure Notes (Signed)
Procedure Name: Intubation Date/Time: 03/13/2012 8:44 AM Performed by: Graciela Husbands Pre-anesthesia Checklist: Suction available, Emergency Drugs available, Timeout performed, Patient identified and Patient being monitored Patient Re-evaluated:Patient Re-evaluated prior to inductionOxygen Delivery Method: Circle system utilized Preoxygenation: Pre-oxygenation with 100% oxygen Intubation Type: IV induction Ventilation: Mask ventilation without difficulty and Oral airway inserted - appropriate to patient size Laryngoscope Size: Mac and 3 Grade View: Grade II Tube type: Oral Tube size: 7.0 mm Number of attempts: 1 Airway Equipment and Method: Stylet Placement Confirmation: ETT inserted through vocal cords under direct vision,  positive ETCO2 and breath sounds checked- equal and bilateral (criciod pressure) Secured at: 7 cm Tube secured with: Tape Dental Injury: Teeth and Oropharynx as per pre-operative assessment

## 2012-03-13 NOTE — Transfer of Care (Signed)
Immediate Anesthesia Transfer of Care Note  Patient: Deanna Scott  Procedure(s) Performed: Procedure(s) (LRB): HYSTERECTOMY VAGINAL (N/A)  Patient Location: PACU  Anesthesia Type: General  Level of Consciousness: awake, alert  and oriented  Airway & Oxygen Therapy: Patient Spontanous Breathing and Patient connected to nasal cannula oxygen  Post-op Assessment: Report given to PACU RN and Post -op Vital signs reviewed and stable  Post vital signs: Reviewed and stable  Complications: No apparent anesthesia complications

## 2012-03-13 NOTE — Addendum Note (Signed)
Addendum  created 03/13/12 1633 by Algis Greenhouse, CRNA   Modules edited:Notes Section

## 2012-03-13 NOTE — Anesthesia Preprocedure Evaluation (Addendum)
Anesthesia Evaluation  Patient identified by MRN, date of birth, ID band Patient awake    Reviewed: Allergy & Precautions, H&P , NPO status , Patient's Chart, lab work & pertinent test results, reviewed documented beta blocker date and time   History of Anesthesia Complications Negative for: history of anesthetic complications  Airway Mallampati: II TM Distance: >3 FB Neck ROM: full    Dental  (+) Teeth Intact   Pulmonary shortness of breath and with exertion, Current Smoker,  Drainage from seasonal allergies breath sounds clear to auscultation  Pulmonary exam normal       Cardiovascular Exercise Tolerance: Good Rhythm:regular Rate:Normal     Neuro/Psych  Headaches (migraines monthly), negative psych ROS   GI/Hepatic Neg liver ROS, GERD- (OTC med occasionally)  ,  Endo/Other  Morbid obesity  Renal/GU negative Renal ROS  Female GU complaint     Musculoskeletal   Abdominal   Peds  Hematology negative hematology ROS (+)   Anesthesia Other Findings Shoulder pain bilaterally - careful with positioning  Reproductive/Obstetrics negative OB ROS                           Anesthesia Physical Anesthesia Plan  ASA: III  Anesthesia Plan: General ETT   Post-op Pain Management:    Induction:   Airway Management Planned:   Additional Equipment:   Intra-op Plan:   Post-operative Plan:   Informed Consent: I have reviewed the patients History and Physical, chart, labs and discussed the procedure including the risks, benefits and alternatives for the proposed anesthesia with the patient or authorized representative who has indicated his/her understanding and acceptance.   Dental Advisory Given  Plan Discussed with: CRNA and Surgeon  Anesthesia Plan Comments:        Anesthesia Quick Evaluation

## 2012-03-13 NOTE — Anesthesia Postprocedure Evaluation (Signed)
  Anesthesia Post-op Note  Patient: Deanna Scott  Procedure(s) Performed: Procedure(s) (LRB): HYSTERECTOMY VAGINAL (N/A)  Patient Location: [facility] Unit  Anesthesia Type: General  Level of Consciousness: sedated  Airway and Oxygen Therapy: Patient Spontanous Breathing  Post-op Pain: mild  Post-op Assessment: Post-op Vital signs reviewed  Post-op Vital Signs: Reviewed and stable  Complications: No apparent anesthesia complications

## 2012-03-13 NOTE — Anesthesia Postprocedure Evaluation (Signed)
Anesthesia Post Note  Patient: Deanna Scott  Procedure(s) Performed: Procedure(s) (LRB): HYSTERECTOMY VAGINAL (N/A)  Anesthesia type: General  Patient location: PACU  Post pain: Pain level controlled  Post assessment: Post-op Vital signs reviewed  Last Vitals:  Filed Vitals:   03/13/12 1046  BP: 115/59  Pulse: 62  Temp: 36.7 C  Resp: 20    Post vital signs: Reviewed  Level of consciousness: sedated  Complications: No apparent anesthesia complications

## 2012-03-13 NOTE — Progress Notes (Signed)
UR chart review completed.  

## 2012-03-14 ENCOUNTER — Encounter (HOSPITAL_COMMUNITY): Payer: Self-pay | Admitting: Obstetrics and Gynecology

## 2012-03-14 LAB — CBC
Hemoglobin: 10.6 g/dL — ABNORMAL LOW (ref 12.0–15.0)
MCH: 29.6 pg (ref 26.0–34.0)
MCHC: 32 g/dL (ref 30.0–36.0)
Platelets: 214 10*3/uL (ref 150–400)
RBC: 3.58 MIL/uL — ABNORMAL LOW (ref 3.87–5.11)

## 2012-03-14 MED ORDER — IBUPROFEN 600 MG PO TABS: 600.0000 mg | ORAL_TABLET | Freq: Four times a day (QID) | ORAL | Status: DC | PRN

## 2012-03-14 MED ORDER — DOCUSATE SODIUM 100 MG PO CAPS: 100.0000 mg | ORAL_CAPSULE | Freq: Two times a day (BID) | ORAL | Status: AC | PRN

## 2012-03-14 MED ORDER — OXYCODONE-ACETAMINOPHEN 5-325 MG PO TABS: 1.0000 | ORAL_TABLET | ORAL | Status: DC | PRN

## 2012-03-14 NOTE — Progress Notes (Signed)
Pt. Is discharged in the care of family. Downstairs per ambulatory. Denies any pain,discomfort or heavy vaginal bleeding.. Stable. Spirits was good.

## 2012-03-14 NOTE — Discharge Summary (Signed)
Physician Discharge Summary  Patient ID: Deanna Scott MRN: [id number] DOB/AGE: May 20, 1971 41 y.o.  Admit date: 03/13/2012 Discharge date: 03/14/2012  Admission Diagnoses: Uterine prolapse   Discharge Diagnoses: same s/p vaginal hysterectomy Active Problems:  * No active hospital problems. *    Discharged Condition: good  Hospital Course: Patient admitted on 5/8 for scheduled vaginal hysterectomy for the treatment of her uterine prolapse. A small 158 gm uterus was removed without immediate complications. Throughout her stay the patient tolerated a regular diet, was able to void and remained afebrile. Patient was discharged home on POD#1 and discharged instructions were provided.  Consults: None  Significant Diagnostic Studies: labs: hg 13 on admission and 10 on day of discharge  Treatments: surgery: vaginal hysterectomy  Discharge Exam: Blood pressure 101/63, pulse 50, temperature 97.7 F (36.5 C), temperature source Oral, resp. rate 18, height 5' 1.75" (1.568 m), weight 97.523 kg (215 lb), SpO2 96.00%. General appearance: alert, cooperative and no distress Resp: clear to auscultation bilaterally Chest wall: heart sounds regular, rate and rhythm GI: soft, non-tender; bowel sounds normal; no masses,  no organomegaly Pelvic: vaginal packing removed with minimal staining  Disposition:    Medication List  As of 03/14/2012  8:05 AM   TAKE these medications         docusate sodium 100 MG capsule   Commonly known as: COLACE   Take 1 capsule (100 mg total) by mouth 2 (two) times daily as needed for constipation.      EXCEDRIN MIGRAINE 250-250-65 MG per tablet   Generic drug: aspirin-acetaminophen-caffeine   Take 1 tablet by mouth every 6 (six) hours as needed.      ibuprofen 600 MG tablet   Commonly known as: ADVIL,MOTRIN   Take 1 tablet (600 mg total) by mouth every 6 (six) hours as needed for pain.      multivitamin animal shapes (with Ca/FA) WITH C & FA Chew   Chew 1 tablet  by mouth daily.      Muscle Rub 10-15 % Crea   Apply topically as needed. Ben Gay      OVER THE COUNTER MEDICATION   Take 1 tablet by mouth daily as needed. Pt takes 1 tablet daily for allergy relief. Pt is unsure of the main ingredient.      oxyCODONE-acetaminophen 5-325 MG per tablet   Commonly known as: PERCOCET   Take 1-2 tablets by mouth every 4 (four) hours as needed.      vitamin B-12 100 MCG tablet   Commonly known as: CYANOCOBALAMIN   Take 300 mcg by mouth daily.           Follow-up Information    Follow up with [facility]. (an appointment will be made for you to be seen in 4-6 weeks)    Contact information:   [address] [place] [address] [phone]         Signed: Russia Scheiderer 03/14/2012, 8:05 AM

## 2012-03-14 NOTE — Discharge Instructions (Signed)
Hysterectomy Care After Refer to this sheet in the next few weeks. These instructions provide you with information on caring for yourself after your procedure. Your caregiver may also give you more specific instructions. Your treatment has been planned according to current medical practices, but problems sometimes occur. Call your caregiver if you have any problems or questions after your procedure. HOME CARE INSTRUCTIONS  Healing will take time. You may have discomfort, tenderness, swelling, and bruising at the surgical site for about 2 weeks. This is normal and will get better as time goes on.  Only take over-the-counter or prescription medicines for pain, discomfort, or fever as directed by your caregiver.   Do not take aspirin. It can cause bleeding.   Do not drive when taking pain medicine.   Follow your caregiver's advice regarding exercise, lifting, driving, and general activities.   Resume your usual diet as directed and allowed.   Get plenty of rest and sleep.   Do not douche, use tampons, or have sexual intercourse for at least 6 weeks or until your caregiver gives you permission.   Change your bandages (dressings) as directed by your caregiver.   Monitor your temperature.   Take showers instead of baths for 2 to 3 weeks.   Do not drink alcohol until your caregiver gives you permission.   If you are constipated, you may take a mild laxative with your caregiver's permission. Bran foods may help with constipation problems. Drinking enough fluids to keep your urine clear or pale yellow may help as well.   Try to have someone home with you for 1 or 2 weeks to help around the house.   Keep all of your follow-up appointments as directed by your caregiver.  SEEK MEDICAL CARE IF:   You have swelling, redness, or increasing pain in the surgical cut (incision) area.   You have pus coming from the incision.   You notice a bad smell coming from the incision or dressing.   You  have swelling, redness, or pain around the intravenous (IV) site.   Your incision breaks open.   You feel dizzy or lightheaded.   You have pain or bleeding when you urinate.   You have persistent diarrhea.   You have persistent nausea and vomiting.   You have abnormal vaginal discharge.   You have a rash.   You have any type of abnormal reaction or develop an allergy to your medicine.   Your pain is not controlled with your prescribed medicine.  SEEK IMMEDIATE MEDICAL CARE IF:   You have a fever.   You have severe abdominal pain.   You have chest pain.   You have shortness of breath.   You faint.   You have pain, swelling, or redness of your leg.   You have heavy vaginal bleeding with blood clots.  MAKE SURE YOU:  Understand these instructions.   Will watch your condition.   Will get help right away if you are not doing well or get worse.  Document Released: 05/12/2005 Document Revised: 10/12/2011 Document Reviewed: 06/09/2011 Exit[employer] Patient Information 2012 [employer]. 

## 2012-03-17 NOTE — Addendum Note (Signed)
Addendum  created 03/17/12 1455 by Dana Allan, MD   Modules edited:Charting, Inpatient Notes

## 2012-03-19 ENCOUNTER — Telehealth: Payer: Self-pay | Admitting: *Deleted

## 2012-03-19 NOTE — Telephone Encounter (Signed)
Pt left message stating that she had surgery on 5/8 and has some questions. I returned pt call and discussed her concerns. She wants to know when she may resume driving. She states she is not taking Percocet at all and only needs ibuprofen occasionally. Per Dr. Macon Large, pt may try driving a short distance to see how she feels. If she experiences abd pain or pulling sensation in the pelvis, she should not drive for a little while longer.  I informed pt of the doctor's recommendations.  Pt asked if she may go out of the house to run some errands if someone else takes her. I said that she may slowly resume ADL including leaving the house for brief periods of time. She should alternate periods of activity with rest. Pt voiced understanding.

## 2012-03-21 ENCOUNTER — Encounter (HOSPITAL_COMMUNITY): Payer: Self-pay

## 2012-03-21 ENCOUNTER — Telehealth: Payer: Self-pay

## 2012-03-21 ENCOUNTER — Inpatient Hospital Stay (HOSPITAL_COMMUNITY)
Admission: AD | Admit: 2012-03-21 | Discharge: 2012-03-21 | Disposition: A | Discharge status: Home / Self Care | Payer: Self-pay | Source: Ambulatory Visit | Attending: Obstetrics and Gynecology | Admitting: Obstetrics and Gynecology

## 2012-03-21 DIAGNOSIS — N949 Unspecified condition associated with female genital organs and menstrual cycle: Secondary | ICD-10-CM | POA: Insufficient documentation

## 2012-03-21 DIAGNOSIS — A499 Bacterial infection, unspecified: Secondary | ICD-10-CM | POA: Insufficient documentation

## 2012-03-21 DIAGNOSIS — B9689 Other specified bacterial agents as the cause of diseases classified elsewhere: Secondary | ICD-10-CM | POA: Insufficient documentation

## 2012-03-21 DIAGNOSIS — N760 Acute vaginitis: Secondary | ICD-10-CM | POA: Insufficient documentation

## 2012-03-21 LAB — WET PREP, GENITAL: Yeast Wet Prep HPF POC: NONE SEEN

## 2012-03-21 MED ORDER — METRONIDAZOLE 500 MG PO TABS: 500.0000 mg | ORAL_TABLET | Freq: Two times a day (BID) | ORAL | Status: AC

## 2012-03-21 MED ORDER — NYSTATIN-TRIAMCINOLONE 100000-0.1 UNIT/GM-% EX CREA: TOPICAL_CREAM | Freq: Four times a day (QID) | CUTANEOUS | Status: DC

## 2012-03-21 NOTE — Telephone Encounter (Signed)
Pt called and stated that she spoke with a nurse on Monday and the discharge has returned-odor, irritated on outside.   I called pt and pt informed me that she was currently in MAU and they are currently taking samples now.  I advised that if she had any concerns to please give [place] a call back. Pt stated understanding.

## 2012-03-21 NOTE — MAU Note (Signed)
CNM in delivery will come to MAU to evaluate patient

## 2012-03-21 NOTE — MAU Provider Note (Signed)
History     CSN: [id number]  Arrival date and time: 03/21/12 1006   First Provider Initiated Contact with Patient 03/21/12 1111      Chief Complaint  Patient presents with  . Vaginal Discharge   HPI 41 y.o. [id number] c/o vaginal discharge and spotting with odor, vulvar irritation since 5/10. Had vaginal hyst on 5/8. No pain.    Past Medical History  Diagnosis Date  . Allergy     latex  . Abnormal Pap smear   . Seasonal allergies   . Shortness of breath     with exercise - smoker  . Migraines     otc meds prn  . Shoulder pain, right     otc meds  . Plantar fasciitis, bilateral     Past Surgical History  Procedure Date  . Bladder suspension 2009  . Tubal ligation   . Svd     x 2  . Dilation and curettage of uterus     hx mab  . Diagnostic laparoscopy     ectopic pregnancy  . Vaginal hysterectomy 03/13/2012    Procedure: HYSTERECTOMY VAGINAL;  Surgeon: Catalina Antigua, MD;  Location: [facility];  Service: Gynecology;  Laterality: N/A;    Family History  Problem Relation Age of Onset  . Cancer Father     stomach  . Diabetes Maternal Grandmother     History  Substance Use Topics  . Smoking status: Current Everyday Smoker -- 1.0 packs/day for 29 years    Types: Cigarettes  . Smokeless tobacco: Never Used  . Alcohol Use: 1.0 oz/week    2 drink(s) per week     socially    Allergies:  Allergies  Allergen Reactions  . Latex Itching and Dermatitis    No prescriptions prior to admission    Review of Systems  Constitutional: Negative.   Respiratory: Negative.   Cardiovascular: Negative.   Gastrointestinal: Negative for nausea, vomiting, abdominal pain, diarrhea and constipation.  Genitourinary: Negative for dysuria, urgency, frequency, hematuria and flank pain.       Positive for vaginal discharge and spotting   Musculoskeletal: Negative.   Neurological: Negative.   Psychiatric/Behavioral: Negative.    Physical Exam   Blood pressure 120/78, pulse 69,  temperature 98.2 F (36.8 C), temperature source Oral, resp. rate 16, height 5\' 2"  (1.575 m), weight 210 lb 9.6 oz (95.528 kg), last menstrual period 03/02/2012.  Physical Exam  Nursing note and vitals reviewed. Constitutional: She is oriented to person, place, and time. She appears well-developed and well-nourished. No distress.  Cardiovascular: Normal rate.   Respiratory: Effort normal.  Genitourinary: There is rash on the right labia. There is rash on the left labia. No bleeding around the vagina. Vaginal discharge: trace light brown, no odor.  Musculoskeletal: Normal range of motion.  Neurological: She is alert and oriented to person, place, and time.  Skin: Skin is warm and dry.  Psychiatric: She has a normal mood and affect.    MAU Course  Procedures  Results for orders placed during the hospital encounter of 03/21/12 (from the past 24 hour(s))  WET PREP, GENITAL     Status: Abnormal   Collection Time   03/21/12 11:08 AM      Component Value Range   Yeast Wet Prep HPF POC NONE SEEN  NONE SEEN    Trich, Wet Prep NONE SEEN  NONE SEEN    Clue Cells Wet Prep HPF POC FEW (*) NONE SEEN    WBC, Wet Prep HPF  POC FEW (*) NONE SEEN      Assessment and Plan  41 y.o. [id number] with BV and vulvar irritation Rx Flagyl and Mycolog cream F/u as scheduled  Deanna Scott 03/21/2012, 4:08 PM

## 2012-03-21 NOTE — MAU Note (Signed)
Had hysterectomy on 5/8 has had vaginal discharge with odor on 5/10 vaginal irritation, LAVH by Dr. Jolayne Panther.

## 2012-03-22 NOTE — MAU Provider Note (Signed)
Agree with above note.  Deanna Scott 03/22/2012 9:03 AM

## 2012-03-27 ENCOUNTER — Telehealth: Payer: Self-pay | Admitting: *Deleted

## 2012-03-27 NOTE — Telephone Encounter (Signed)
Patient called and left a message stating she had surgery 03/13/12 and has some questions like can she go swimming and requests a call back.

## 2012-03-28 NOTE — Telephone Encounter (Signed)
Spoke with pt and advised pt to not go into the pool until she has been seen for her post -op appt.  At that time the provider will be able give the ok to go swimming.  I verified with pt to make sure she knew of her appt 04/19/12 @ 0845 and pt stated "yes I know of the appt".  Pt had no further questions.

## 2012-04-19 ENCOUNTER — Ambulatory Visit (INDEPENDENT_AMBULATORY_CARE_PROVIDER_SITE_OTHER): Payer: Self-pay | Admitting: Obstetrics and Gynecology

## 2012-04-19 ENCOUNTER — Encounter: Payer: Self-pay | Admitting: Obstetrics and Gynecology

## 2012-04-19 VITALS — BP 123/87 | HR 72 | Temp 97.6°F | Ht 62.0 in | Wt 211.1 lb

## 2012-04-19 DIAGNOSIS — Z09 Encounter for follow-up examination after completed treatment for conditions other than malignant neoplasm: Secondary | ICD-10-CM

## 2012-04-19 NOTE — Progress Notes (Signed)
  Subjective:    Patient ID: Deanna Scott, female    DOB: 12/13/1970, 41 y.o.   MRN: [id number]  HPI  41 yo [id number] s/p TVH 03/13/2012 presenting today for post-operative check. Patient doing well without complaints. Denies any fever or chills since her surgery Past Medical History  Diagnosis Date  . Allergy     latex  . Abnormal Pap smear   . Seasonal allergies   . Shortness of breath     with exercise - smoker  . Migraines     otc meds prn  . Shoulder pain, right     otc meds  . Plantar fasciitis, bilateral    Past Surgical History  Procedure Date  . Bladder suspension 2009  . Tubal ligation   . Svd     x 2  . Dilation and curettage of uterus     hx mab  . Diagnostic laparoscopy     ectopic pregnancy  . Vaginal hysterectomy 03/13/2012    Procedure: HYSTERECTOMY VAGINAL;  Surgeon: Catalina Antigua, MD;  Location: [facility];  Service: Gynecology;  Laterality: N/A;   Family History  Problem Relation Age of Onset  . Cancer Father     stomach  . Diabetes Maternal Grandmother      Review of Systems  All other systems reviewed and are negative.       Objective:   Physical Exam GENERAL: Well-developed, well-nourished female in no acute distress.  HEART: Regular rate and rhythm. ABDOMEN: Soft, nontender, nondistended. No organomegaly. PELVIC: Normal external female genitalia. Vagina is pink and rugated.  Normal discharge. Vaginal vault intact. No adnexal mass or tenderness. EXTREMITIES: No cyanosis, clubbing, or edema, 2+ distal pulses.     Assessment & Plan:  41 yo G4P2 s/p TVH on 03/13/12 here for post-op check - Patient medically cleared to resume all activities - Patient advised to follow-up with primary care physician for annual exam - Patient advised to continue Kegel exercises

## 2012-05-27 ENCOUNTER — Encounter: Payer: Self-pay | Admitting: Obstetrics & Gynecology

## 2012-05-27 ENCOUNTER — Ambulatory Visit (INDEPENDENT_AMBULATORY_CARE_PROVIDER_SITE_OTHER): Payer: Self-pay | Admitting: Obstetrics & Gynecology

## 2012-05-27 VITALS — Temp 97.6°F | Ht 62.0 in | Wt 213.6 lb

## 2012-05-27 DIAGNOSIS — N816 Rectocele: Secondary | ICD-10-CM

## 2012-05-27 NOTE — Progress Notes (Signed)
History:  41 y.o. [id number] TVH on 03/13/12 by Dr. Jolayne Panther here today with complaint of "feeling something down there" after a shower yesterday.  Describes it as a ball of tissue.  No other concerning symptoms.  The following portions of the patient's history were reviewed and updated as appropriate: allergies, current medications, past family history, past medical history, past social history, past surgical history and problem list.  Review of Systems:  Pertinent items are noted in HPI.  Objective:  Physical Exam Last menstrual period 03/02/2012. Gen: NAD Abd: Soft, nontender and nondistended Pelvic: Normal appearing external genitalia; normal appearing vaginal mucosa and cervix with small rectocele noted; Grade 1 rectocele on Valsalva. Patient confirmed this was the area she felt.  Normal discharge.   Assessment & Plan:  Grade 1 rectocele, otherwise normal exam.  No fecal/flatal incontinence, no other symptoms, no reason for intervention. Patient reassured. She was advised to return to clinic with any other GYN concerns

## 2012-05-27 NOTE — Patient Instructions (Signed)
Return to clinic for any scheduled appointments or for any gynecologic concerns as needed.   

## 2012-07-06 ENCOUNTER — Emergency Department (HOSPITAL_COMMUNITY)
Admission: EM | Admit: 2012-07-06 | Discharge: 2012-07-06 | Disposition: A | Discharge status: Home / Self Care | Payer: Self-pay | Attending: Emergency Medicine | Admitting: Emergency Medicine

## 2012-07-06 ENCOUNTER — Encounter (HOSPITAL_COMMUNITY): Payer: Self-pay

## 2012-07-06 DIAGNOSIS — Z91040 Latex allergy status: Secondary | ICD-10-CM | POA: Insufficient documentation

## 2012-07-06 DIAGNOSIS — Z833 Family history of diabetes mellitus: Secondary | ICD-10-CM | POA: Insufficient documentation

## 2012-07-06 DIAGNOSIS — M25519 Pain in unspecified shoulder: Secondary | ICD-10-CM | POA: Insufficient documentation

## 2012-07-06 DIAGNOSIS — M5380 Other specified dorsopathies, site unspecified: Secondary | ICD-10-CM | POA: Insufficient documentation

## 2012-07-06 DIAGNOSIS — Z800 Family history of malignant neoplasm of digestive organs: Secondary | ICD-10-CM | POA: Insufficient documentation

## 2012-07-06 DIAGNOSIS — F17200 Nicotine dependence, unspecified, uncomplicated: Secondary | ICD-10-CM | POA: Insufficient documentation

## 2012-07-06 DIAGNOSIS — M25511 Pain in right shoulder: Secondary | ICD-10-CM

## 2012-07-06 DIAGNOSIS — M62830 Muscle spasm of back: Secondary | ICD-10-CM

## 2012-07-06 MED ORDER — KETOROLAC TROMETHAMINE 60 MG/2ML IM SOLN
60.0000 mg | Freq: Once | INTRAMUSCULAR | Status: AC
  Administered 2012-07-06: 60 mg via INTRAMUSCULAR
  Filled 2012-07-06: qty 2

## 2012-07-06 MED ORDER — OXYCODONE-ACETAMINOPHEN 5-325 MG PO TABS: ORAL_TABLET | ORAL | Status: DC

## 2012-07-06 NOTE — ED Provider Notes (Signed)
Medical screening examination/treatment/procedure(s) were performed by non-physician practitioner and as supervising physician I was immediately available for consultation/collaboration.  Jahmiyah Dullea, MD 07/06/12 1502 

## 2012-07-06 NOTE — ED Notes (Signed)
C/o exacerbation of her chronic shoulder pain. Right shoulder pain this time with no relief from ibuprofen. Somewhat full ROM on right arm. Alert, oriented. ABC intact. No acute distress noted

## 2012-07-06 NOTE — ED Provider Notes (Signed)
History     CSN: [id number]  Arrival date & time 07/06/12  0929   First MD Initiated Contact with Patient 07/06/12 0935      No chief complaint on file.   (Consider location/radiation/quality/duration/timing/severity/associated sxs/prior treatment) HPI  41 y.o. female in no acute distress complaining of exacerbation of chronic right shoulder pain worsening over the course of 5 days. Patient normally takes 800 mg of ibuprofen 3 times a day with no relief. Patient denies any numbness or paresthesia, reduction in range of motion, recent trauma or repetitive use. Pain is rated at 8/10, described as sharp, exacerbated by movement and certain positions and located on the posterior right shoulder.    Past Medical History  Diagnosis Date  . Allergy     latex  . Abnormal Pap smear   . Seasonal allergies   . Shortness of breath     with exercise - smoker  . Migraines     otc meds prn  . Shoulder pain, right     otc meds  . Plantar fasciitis, bilateral     Past Surgical History  Procedure Date  . Bladder suspension 2009  . Tubal ligation   . Svd     x 2  . Dilation and curettage of uterus     hx mab  . Diagnostic laparoscopy     ectopic pregnancy  . Vaginal hysterectomy 03/13/2012    Procedure: HYSTERECTOMY VAGINAL;  Surgeon: Catalina Antigua, MD;  Location: [facility];  Service: Gynecology;  Laterality: N/A;    Family History  Problem Relation Age of Onset  . Cancer Father     stomach  . Diabetes Maternal Grandmother     History  Substance Use Topics  . Smoking status: Current Everyday Smoker -- 1.0 packs/day for 29 years    Types: Cigarettes  . Smokeless tobacco: Never Used  . Alcohol Use: 1.0 oz/week    2 drink(s) per week     socially    OB History    Grav Para Term Preterm Abortions TAB SAB Ect Mult Living   4 2 2  2  1 1  2       Review of Systems  Constitutional: Negative for fever.  Respiratory: Negative for shortness of breath.   Cardiovascular: Negative  for chest pain.  Gastrointestinal: Negative for nausea, vomiting, abdominal pain and diarrhea.  Musculoskeletal: Positive for arthralgias.  Neurological: Negative for weakness and numbness.  All other systems reviewed and are negative.    Allergies  Latex  Home Medications   Current Outpatient Rx  Name Route Sig Dispense Refill  . ASPIRIN-ACETAMINOPHEN-CAFFEINE 250-250-65 MG PO TABS Oral Take 2 tablets by mouth every 6 (six) hours as needed. For migraine.    . IBUPROFEN 800 MG PO TABS Oral Take 800 mg by mouth every 8 (eight) hours as needed. pain    . LORATADINE 10 MG PO TABS Oral Take 10 mg by mouth daily.    . MUSCLE RUB 10-15 % EX CREA Topical Apply 1 application topically as needed. For muscle pain.    Marland Kitchen ANIMAL SHAPES WITH C & FA PO CHEW Oral Chew 1 tablet by mouth daily.    Marland Kitchen VITAMIN B-12 1000 MCG PO TABS Oral Take 3,000 mcg by mouth daily.    . OXYCODONE-ACETAMINOPHEN 5-325 MG PO TABS  1 to 2 tabs PO q6hrs  PRN for pain 15 tablet 0    BP 120/86  Pulse 73  Temp 97.9 F (36.6 C) (Oral)  Resp  16  SpO2 94%  LMP 03/02/2012  Physical Exam  Nursing note and vitals reviewed. Constitutional: She is oriented to person, place, and time. She appears well-developed and well-nourished. No distress.  HENT:  Head: Normocephalic.  Eyes: Conjunctivae and EOM are normal. Pupils are equal, round, and reactive to light.  Neck: Normal range of motion.  Cardiovascular: Normal rate.   Pulmonary/Chest: Effort normal.  Abdominal: Soft.  Musculoskeletal: Normal range of motion.       Right shoulder shows no deformity, swelling, warmth. Moderately reduced range of motion in abduction. However, patient can lift above 90 and drop arm is negative. Trapezius on the right side show spasm and is diffusely tender.  Neurological: She is alert and oriented to person, place, and time.  Psychiatric: She has a normal mood and affect.    ED Course  Procedures (including critical care time)  Labs  Reviewed - No data to display No results found.   1. Muscle spasm of back   2. Shoulder pain, right       MDM  Patient with exacerbation of chronic right shoulder pain with muscle spasm and tenderness to palpation. I will give her Toradol here pain control is limited by the fact she is driving. We'll write her a prescription for Percocet to go home with. Patient is neurovascularly intact with good range of motion and no sign of any acute damage.  Pt verbalized understanding and agrees with care plan. Outpatient follow-up and return precautions given.          Wynetta Emery, PA-C 07/06/12 1103

## 2012-07-09 ENCOUNTER — Emergency Department (HOSPITAL_COMMUNITY)
Admission: EM | Admit: 2012-07-09 | Discharge: 2012-07-09 | Disposition: A | Discharge status: Home / Self Care | Payer: Self-pay | Source: Home / Self Care | Attending: Family Medicine | Admitting: Family Medicine

## 2012-07-09 ENCOUNTER — Encounter (HOSPITAL_COMMUNITY): Payer: Self-pay | Admitting: *Deleted

## 2012-07-09 ENCOUNTER — Telehealth: Payer: Self-pay | Admitting: Family Medicine

## 2012-07-09 DIAGNOSIS — S46919A Strain of unspecified muscle, fascia and tendon at shoulder and upper arm level, unspecified arm, initial encounter: Secondary | ICD-10-CM

## 2012-07-09 MED ORDER — HYDROCODONE-ACETAMINOPHEN 5-325 MG PO TABS: 1.0000 | ORAL_TABLET | Freq: Four times a day (QID) | ORAL | Status: DC | PRN

## 2012-07-09 MED ORDER — CYCLOBENZAPRINE HCL 5 MG PO TABS: 5.0000 mg | ORAL_TABLET | Freq: Three times a day (TID) | ORAL | Status: AC | PRN

## 2012-07-09 NOTE — Telephone Encounter (Signed)
Returned call to patient.  Wants to know if she can be rx'd a "muscle relaxer" to take instead of Percocet.  Patient states she has to be able to work and can't take Percocet while at work.  Was seen in ED on 07/06/12 for muscle spasm.  Will route request to Dr. Gwenlyn Saran and call patient back.  Gaylene Brooks, RN

## 2012-07-09 NOTE — Telephone Encounter (Signed)
Was in ED on Saturday and states that the meds too strong and wants to know if she can get something different - would like muscle relaxer [facility]

## 2012-07-09 NOTE — ED Provider Notes (Signed)
History     CSN: [id number]  Arrival date & time 07/09/12  1606   First MD Initiated Contact with Patient 07/09/12 1720      Chief Complaint  Patient presents with  . Medication Reaction    (Consider location/radiation/quality/duration/timing/severity/associated sxs/prior treatment) Patient is a 41 y.o. female presenting with neck injury.  Neck Injury This is a chronic problem. The current episode started more than 2 days ago (seen 3 d ago in ER given oxy  which pt says she told ER was too strong and can't take.). The problem has been gradually worsening.    Past Medical History  Diagnosis Date  . Allergy     latex  . Abnormal Pap smear   . Seasonal allergies   . Shortness of breath     with exercise - smoker  . Migraines     otc meds prn  . Shoulder pain, right     otc meds  . Plantar fasciitis, bilateral     Past Surgical History  Procedure Date  . Bladder suspension 2009  . Tubal ligation   . Svd     x 2  . Dilation and curettage of uterus     hx mab  . Diagnostic laparoscopy     ectopic pregnancy  . Vaginal hysterectomy 03/13/2012    Procedure: HYSTERECTOMY VAGINAL;  Surgeon: Catalina Antigua, MD;  Location: [facility];  Service: Gynecology;  Laterality: N/A;    Family History  Problem Relation Age of Onset  . Cancer Father     stomach  . Diabetes Maternal Grandmother     History  Substance Use Topics  . Smoking status: Current Everyday Smoker -- 1.0 packs/day for 29 years    Types: Cigarettes  . Smokeless tobacco: Never Used  . Alcohol Use: 1.0 oz/week    2 drink(s) per week     socially    OB History    Grav Para Term Preterm Abortions TAB SAB Ect Mult Living   4 2 2  2  1 1  2       Review of Systems  Constitutional: Negative.   HENT: Positive for neck pain.     Allergies  Latex  Home Medications   Current Outpatient Rx  Name Route Sig Dispense Refill  . ASPIRIN-ACETAMINOPHEN-CAFFEINE 250-250-65 MG PO TABS Oral Take 2 tablets by mouth  every 6 (six) hours as needed. For migraine.    . CYCLOBENZAPRINE HCL 5 MG PO TABS Oral Take 1 tablet (5 mg total) by mouth 3 (three) times daily as needed for muscle spasms. 30 tablet 0  . HYDROCODONE-ACETAMINOPHEN 5-325 MG PO TABS Oral Take 1 tablet by mouth every 6 (six) hours as needed for pain. 15 tablet 0  . IBUPROFEN 800 MG PO TABS Oral Take 800 mg by mouth every 8 (eight) hours as needed. pain    . LORATADINE 10 MG PO TABS Oral Take 10 mg by mouth daily.    . MUSCLE RUB 10-15 % EX CREA Topical Apply 1 application topically as needed. For muscle pain.    . OXYCODONE-ACETAMINOPHEN 5-325 MG PO TABS  1 to 2 tabs PO q6hrs  PRN for pain 15 tablet 0  . ANIMAL SHAPES WITH C & FA PO CHEW Oral Chew 1 tablet by mouth daily.    Marland Kitchen VITAMIN B-12 1000 MCG PO TABS Oral Take 3,000 mcg by mouth daily.      BP 139/93  Pulse 86  Temp 98.1 F (36.7 C) (Oral)  Resp  20  SpO2 98%  LMP 03/02/2012  Physical Exam  Nursing note and vitals reviewed. Constitutional: She is oriented to person, place, and time. She appears well-developed and well-nourished.  Musculoskeletal: She exhibits tenderness.       Arms: Neurological: She is alert and oriented to person, place, and time.  Skin: Skin is warm and dry.    ED Course  Procedures (including critical care time)  Labs Reviewed - No data to display No results found.   1. Muscle strain of scapular region       MDM          Linna Hoff, MD 07/09/12 1743

## 2012-07-09 NOTE — Telephone Encounter (Signed)
Called and informed patient that she will need an office visit before muscle relaxant can possibly be prescribed.  Patient unable to come in until after 4:15pm.  Patient will go back to urgent care this evening and see if they can change med for her.  Gaylene Brooks, RN

## 2012-07-09 NOTE — ED Notes (Signed)
Pt  Seen  Er  3  Days  Ago  For  Shoulder  Sprain  She  Reports  Was  Given  rx  For  Oxycodone  She  Says  It  Is  Too  Strong         She  Wants  Something  Milder   She    States  Her  Shoulder  Has  Been  Giving  Her  Symptoms  For  About  1  Week    She  Continues  To  Have  Pain

## 2012-07-09 NOTE — Telephone Encounter (Signed)
I can not precribe muscle relaxant over the phone and would need to see patient in office first.

## 2012-07-11 ENCOUNTER — Encounter: Payer: Self-pay | Admitting: Family Medicine

## 2012-07-11 ENCOUNTER — Ambulatory Visit (INDEPENDENT_AMBULATORY_CARE_PROVIDER_SITE_OTHER): Payer: Self-pay | Admitting: Family Medicine

## 2012-07-11 VITALS — BP 122/83 | HR 87 | Temp 98.0°F | Ht 62.0 in | Wt 216.0 lb

## 2012-07-11 DIAGNOSIS — M542 Cervicalgia: Secondary | ICD-10-CM

## 2012-07-11 DIAGNOSIS — M25519 Pain in unspecified shoulder: Secondary | ICD-10-CM

## 2012-07-11 MED ORDER — TRAMADOL HCL 50 MG PO TABS: 50.0000 mg | ORAL_TABLET | Freq: Four times a day (QID) | ORAL | Status: AC | PRN

## 2012-07-11 NOTE — Progress Notes (Signed)
  Subjective:    Patient ID: Deanna Scott, female    DOB: Feb 12, 1971, 41 y.o.   MRN: [id number]  HPI # Neck spasm  She was seen in the ED 08/31 and in UC 09/03 for this issue The percocet given in the ED was too strong and so she was given Norco/Vicodin and Robaxin at [facility] She says the latter medications are better but still make her feel drowsy at work. She is a Conservation officer, nature at one of the [employer]  She reports improvement in her neck pain with the medications, however, it is still very bothersome  Review of Systems Denies arm weakness or numbness Denies new or significant stressors  Allergies, medication, past medical history reviewed.  Significant for: -Tobacco use -History of right shoulder rotator cuff syndrome and adhesive capsulitis 01/2011    Objective:   Physical Exam GEN: NAD NECK: spasm right trapezius; full ROM; no mid-line tenderness MSK:   SHOULDER: no tenderness to palpation; intact ROM except for mildly decreased external rotation of right shoulder compared to left; 5/5 strength; sensation intact; left shoulder "clicks" with abduction; negative Neers/Hawkins-Kennedy bilaterally     Assessment & Plan:

## 2012-07-11 NOTE — Patient Instructions (Signed)
For your shoulder: -Warm compresses throughout the day -Take the flexeril and tramadol at home as needed -STOP the oxycodone and hydrocodone (Vicodin/Norco)  We will refer you to physical therapy  Follow-up with Dr. Gwenlyn Saran in about a month to let her know how your shoulder is doing

## 2012-07-11 NOTE — Assessment & Plan Note (Signed)
-  Changed narcotics received in ED/UC to Tramadol as needed -Continue Robaxin when at home -Given work note for today and tomorrow -Will refer to PT for shoulder strengthening exercises due to history of rotator cuff syndrome and other shoulder problems in the past that likely predispose her to spasms

## 2012-07-15 ENCOUNTER — Ambulatory Visit: Payer: Self-pay | Admitting: Family Medicine

## 2012-08-09 ENCOUNTER — Ambulatory Visit: Payer: Self-pay | Admitting: Family Medicine

## 2012-08-19 ENCOUNTER — Ambulatory Visit: Payer: Self-pay | Admitting: Physical Therapy

## 2012-09-03 ENCOUNTER — Ambulatory Visit: Payer: Self-pay | Admitting: Rehabilitative and Restorative Service Providers"

## 2012-10-21 ENCOUNTER — Encounter: Payer: Self-pay | Admitting: Family Medicine

## 2012-10-21 ENCOUNTER — Ambulatory Visit (INDEPENDENT_AMBULATORY_CARE_PROVIDER_SITE_OTHER): Payer: No Typology Code available for payment source | Admitting: Family Medicine

## 2012-10-21 VITALS — BP 131/87 | HR 68 | Temp 97.7°F | Ht 62.0 in | Wt 220.0 lb

## 2012-10-21 DIAGNOSIS — M25519 Pain in unspecified shoulder: Secondary | ICD-10-CM

## 2012-10-21 DIAGNOSIS — M7541 Impingement syndrome of right shoulder: Secondary | ICD-10-CM

## 2012-10-21 MED ORDER — MELOXICAM 15 MG PO TABS: 15.0000 mg | ORAL_TABLET | Freq: Every day | ORAL | Status: DC

## 2012-10-21 MED ORDER — CYCLOBENZAPRINE HCL 10 MG PO TABS: 10.0000 mg | ORAL_TABLET | Freq: Two times a day (BID) | ORAL | Status: DC | PRN

## 2012-10-21 NOTE — Progress Notes (Signed)
Patient ID: Deanna Scott    DOB: 09/28/1971, 41 y.o.   MRN: [id number] --- Subjective:  [name] is a 41 y.o.female who presents with shoulder pain.  - shoulder pain: started in right shoulder: x2 years. 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 Worst after working.  Left shoulder started hurting as well: 1 month. Overall pain in both shoulders has gotten worst since August. Feels like muscle locks up.  Ibuprofen 800mg  bid doesn't help. Heating pad helps.  Muscle relaxant used to help. Has not had PT because of insurance issues. No weakness. No numbness. No tingling  ROS: see HPI Past Medical History: reviewed and updated medications and allergies. Social History: Tobacco: 1 cigarette. Plans on quitting 10/21/12 (today)  Objective: Filed Vitals:   10/21/12 1336  BP: 131/87  Pulse: 68  Temp: 97.7 F (36.5 C)    Physical Examination:   General appearance - alert, well appearing, and in no distress Chest - clear to auscultation, no wheezes, rales or rhonchi, symmetric air entry Heart - normal rate, regular rhythm, normal S1, S2, no murmurs, rubs, clicks or gallops MSK - Apley scratch test positive for pain both upper and lower on right side, normal on left. Pain on left with empty can test. Normal on right. Some tenderness with HAwkin's test on left.  4+/5 strength of deltoid, biceps and triceps bilaterally

## 2012-10-21 NOTE — Patient Instructions (Addendum)
Please return in 1 month to see how you are doing.   Impingement Syndrome, Rotator Cuff, Bursitis with Rehab Impingement syndrome is a condition that involves inflammation of the tendons of the rotator cuff and the subacromial bursa, that causes pain in the shoulder. The rotator cuff consists of four tendons and muscles that control much of the shoulder and upper arm function. The subacromial bursa is a fluid filled sac that helps reduce friction between the rotator cuff and one of the bones of the shoulder (acromion). Impingement syndrome is usually an overuse injury that causes swelling of the bursa (bursitis), swelling of the tendon (tendonitis), and/or a tear of the tendon (strain). Strains are classified into three categories. Grade 1 strains cause pain, but the tendon is not lengthened. Grade 2 strains include a lengthened ligament, due to the ligament being stretched or partially ruptured. With grade 2 strains there is still function, although the function may be decreased. Grade 3 strains include a complete tear of the tendon or muscle, and function is usually impaired. SYMPTOMS   Pain around the shoulder, often at the outer portion of the upper arm.  Pain that gets worse with shoulder function, especially when reaching overhead or lifting.  Sometimes, aching when not using the arm.  Pain that wakes you up at night.  Sometimes, tenderness, swelling, warmth, or redness over the affected area.  Loss of strength.  Limited motion of the shoulder, especially reaching behind the back (to the back pocket or to unhook bra) or across your body.  Crackling sound (crepitation) when moving the arm.  Biceps tendon pain and inflammation (in the front of the shoulder). Worse when bending the elbow or lifting. CAUSES  Impingement syndrome is often an overuse injury, in which chronic (repetitive) motions cause the tendons or bursa to become inflamed. A strain occurs when a force is paced on the  tendon or muscle that is greater than it can withstand. Common mechanisms of injury include: Stress from sudden increase in duration, frequency, or intensity of training.  Direct hit (trauma) to the shoulder.  Aging, erosion of the tendon with normal use.  Bony bump on shoulder (acromial spur). RISK INCREASES WITH:  Contact sports (football, wrestling, boxing).  Throwing sports (baseball, tennis, volleyball).  Weightlifting and bodybuilding.  Heavy labor.  Previous injury to the rotator cuff, including impingement.  Poor shoulder strength and flexibility.  Failure to warm up properly before activity.  Inadequate protective equipment.  Old age.  Bony bump on shoulder (acromial spur). PREVENTION   Warm up and stretch properly before activity.  Allow for adequate recovery between workouts.  Maintain physical fitness:  Strength, flexibility, and endurance.  Cardiovascular fitness.  Learn and use proper exercise technique. PROGNOSIS  If treated properly, impingement syndrome usually goes away within 6 weeks. Sometimes surgery is required.  RELATED COMPLICATIONS   Longer healing time if not properly treated, or if not given enough time to heal.  Recurring symptoms, that result in a chronic condition.  Shoulder stiffness, frozen shoulder, or loss of motion.  Rotator cuff tendon tear.  Recurring symptoms, especially if activity is resumed too soon, with overuse, with a direct blow, or when using poor technique. TREATMENT  Treatment first involves the use of ice and medicine, to reduce pain and inflammation. The use of strengthening and stretching exercises may help reduce pain with activity. These exercises may be performed at home or with a therapist. If non-surgical treatment is unsuccessful after more than 6 months, surgery may  be advised. After surgery and rehabilitation, activity is usually possible in 3 months.  MEDICATION  If pain medicine is needed,  nonsteroidal anti-inflammatory medicines (aspirin and ibuprofen), or other minor pain relievers (acetaminophen), are often advised.  Do not take pain medicine for 7 days before surgery.  Prescription pain relievers may be given, if your caregiver thinks they are needed. Use only as directed and only as much as you need.  Corticosteroid injections may be given by your caregiver. These injections should be reserved for the most serious cases, because they may only be given a certain number of times. HEAT AND COLD  Cold treatment (icing) should be applied for 10 to 15 minutes every 2 to 3 hours for inflammation and pain, and immediately after activity that aggravates your symptoms. Use ice packs or an ice massage.  Heat treatment may be used before performing stretching and strengthening activities prescribed by your caregiver, physical therapist, or athletic trainer. Use a heat pack or a warm water soak. SEEK MEDICAL CARE IF:   Symptoms get worse or do not improve in 4 to 6 weeks, despite treatment.  New, unexplained symptoms develop. (Drugs used in treatment may produce side effects.) EXERCISES  RANGE OF MOTION (ROM) AND STRETCHING EXERCISES - Impingement Syndrome (Rotator Cuff  Tendinitis, Bursitis) These exercises may help you when beginning to rehabilitate your injury. Your symptoms may go away with or without further involvement from your physician, physical therapist or athletic trainer. While completing these exercises, remember:   Restoring tissue flexibility helps normal motion to return to the joints. This allows healthier, less painful movement and activity.  An effective stretch should be held for at least 30 seconds.  A stretch should never be painful. You should only feel a gentle lengthening or release in the stretched tissue. STRETCH  Flexion, Standing  Stand with good posture. With an underhand grip on your right / left hand, and an overhand grip on the opposite hand,  grasp a broomstick or cane so that your hands are a little more than shoulder width apart.  Keeping your right / left elbow straight and shoulder muscles relaxed, push the stick with your opposite hand, to raise your right / left arm in front of your body and then overhead. Raise your arm until you feel a stretch in your right / left shoulder, but before you have increased shoulder pain.  Try to avoid shrugging your right / left shoulder as your arm rises, by keeping your shoulder blade tucked down and toward your mid-back spine. Hold for __________ seconds.  Slowly return to the starting position. Repeat __________ times. Complete this exercise __________ times per day. STRETCH  Abduction, Supine  Lie on your back. With an underhand grip on your right / left hand and an overhand grip on the opposite hand, grasp a broomstick or cane so that your hands are a little more than shoulder width apart.  Keeping your right / left elbow straight and your shoulder muscles relaxed, push the stick with your opposite hand, to raise your right / left arm out to the side of your body and then overhead. Raise your arm until you feel a stretch in your right / left shoulder, but before you have increased shoulder pain.  Try to avoid shrugging your right / left shoulder as your arm rises, by keeping your shoulder blade tucked down and toward your mid-back spine. Hold for __________ seconds.  Slowly return to the starting position. Repeat __________ times. Complete this  exercise __________ times per day. ROM  Flexion, Active-Assisted  Lie on your back. You may bend your knees for comfort.  Grasp a broomstick or cane so your hands are about shoulder width apart. Your right / left hand should grip the end of the stick, so that your hand is positioned "thumbs-up," as if you were about to shake hands.  Using your healthy arm to lead, raise your right / left arm overhead, until you feel a gentle stretch in your  shoulder. Hold for __________ seconds.  Use the stick to assist in returning your right / left arm to its starting position. Repeat __________ times. Complete this exercise __________ times per day.  ROM - Internal Rotation, Supine   Lie on your back on a firm surface. Place your right / left elbow about 60 degrees away from your side. Elevate your elbow with a folded towel, so that the elbow and shoulder are the same height.  Using a broomstick or cane and your strong arm, pull your right / left hand toward your body until you feel a gentle stretch, but no increase in your shoulder pain. Keep your shoulder and elbow in place throughout the exercise.  Hold for __________ seconds. Slowly return to the starting position. Repeat __________ times. Complete this exercise __________ times per day. STRETCH - Internal Rotation  Place your right / left hand behind your back, palm up.  Throw a towel or belt over your opposite shoulder. Grasp the towel with your right / left hand.  While keeping an upright posture, gently pull up on the towel, until you feel a stretch in the front of your right / left shoulder.  Avoid shrugging your right / left shoulder as your arm rises, by keeping your shoulder blade tucked down and toward your mid-back spine.  Hold for __________ seconds. Release the stretch, by lowering your healthy hand. Repeat __________ times. Complete this exercise __________ times per day. ROM - Internal Rotation   Using an underhand grip, grasp a stick behind your back with both hands.  While standing upright with good posture, slide the stick up your back until you feel a mild stretch in the front of your shoulder.  Hold for __________ seconds. Slowly return to your starting position. Repeat __________ times. Complete this exercise __________ times per day.  STRETCH  Posterior Shoulder Capsule   Stand or sit with good posture. Grasp your right / left elbow and draw it across your  chest, keeping it at the same height as your shoulder.  Pull your elbow, so your upper arm comes in closer to your chest. Pull until you feel a gentle stretch in the back of your shoulder.  Hold for __________ seconds. Repeat __________ times. Complete this exercise __________ times per day. STRENGTHENING EXERCISES - Impingement Syndrome (Rotator Cuff Tendinitis, Bursitis) These exercises may help you when beginning to rehabilitate your injury. They may resolve your symptoms with or without further involvement from your physician, physical therapist or athletic trainer. While completing these exercises, remember:  Muscles can gain both the endurance and the strength needed for everyday activities through controlled exercises.  Complete these exercises as instructed by your physician, physical therapist or athletic trainer. Increase the resistance and repetitions only as guided.  You may experience muscle soreness or fatigue, but the pain or discomfort you are trying to eliminate should never worsen during these exercises. If this pain does get worse, stop and make sure you are following the directions exactly. If  the pain is still present after adjustments, discontinue the exercise until you can discuss the trouble with your clinician.  During your recovery, avoid activity or exercises which involve actions that place your injured hand or elbow above your head or behind your back or head. These positions stress the tissues which you are trying to heal. STRENGTH - Scapular Depression and Adduction   With good posture, sit on a firm chair. Support your arms in front of you, with pillows, arm rests, or on a table top. Have your elbows in line with the sides of your body.  Gently draw your shoulder blades down and toward your mid-back spine. Gradually increase the tension, without tensing the muscles along the top of your shoulders and the back of your neck.  Hold for __________ seconds. Slowly  release the tension and relax your muscles completely before starting the next repetition.  After you have practiced this exercise, remove the arm support and complete the exercise in standing as well as sitting position. Repeat __________ times. Complete this exercise __________ times per day.  STRENGTH - Shoulder Abductors, Isometric  With good posture, stand or sit about 4-6 inches from a wall, with your right / left side facing the wall.  Bend your right / left elbow. Gently press your right / left elbow into the wall. Increase the pressure gradually, until you are pressing as hard as you can, without shrugging your shoulder or increasing any shoulder discomfort.  Hold for __________ seconds.  Release the tension slowly. Relax your shoulder muscles completely before you begin the next repetition. Repeat __________ times. Complete this exercise __________ times per day.  STRENGTH - External Rotators, Isometric  Keep your right / left elbow at your side and bend it 90 degrees.  Step into a door frame so that the outside of your right / left wrist can press against the door frame without your upper arm leaving your side.  Gently press your right / left wrist into the door frame, as if you were trying to swing the back of your hand away from your stomach. Gradually increase the tension, until you are pressing as hard as you can, without shrugging your shoulder or increasing any shoulder discomfort.  Hold for __________ seconds.  Release the tension slowly. Relax your shoulder muscles completely before you begin the next repetition. Repeat __________ times. Complete this exercise __________ times per day.  STRENGTH - Supraspinatus   Stand or sit with good posture. Grasp a __________ weight, or an exercise band or tubing, so that your hand is "thumbs-up," like you are shaking hands.  Slowly lift your right / left arm in a "V" away from your thigh, diagonally into the space between your  side and straight ahead. Lift your hand to shoulder height or as far as you can, without increasing any shoulder pain. At first, many people do not lift their hands above shoulder height.  Avoid shrugging your right / left shoulder as your arm rises, by keeping your shoulder blade tucked down and toward your mid-back spine.  Hold for __________ seconds. Control the descent of your hand, as you slowly return to your starting position. Repeat __________ times. Complete this exercise __________ times per day.  STRENGTH - External Rotators  Secure a rubber exercise band or tubing to a fixed object (table, pole) so that it is at the same height as your right / left elbow when you are standing or sitting on a firm surface.  Stand or  sit so that the secured exercise band is at your uninjured side.  Bend your right / left elbow 90 degrees. Place a folded towel or small pillow under your right / left arm, so that your elbow is a few inches away from your side.  Keeping the tension on the exercise band, pull it away from your body, as if pivoting on your elbow. Be sure to keep your body steady, so that the movement is coming only from your rotating shoulder.  Hold for __________ seconds. Release the tension in a controlled manner, as you return to the starting position. Repeat __________ times. Complete this exercise __________ times per day.  STRENGTH - Internal Rotators   Secure a rubber exercise band or tubing to a fixed object (table, pole) so that it is at the same height as your right / left elbow when you are standing or sitting on a firm surface.  Stand or sit so that the secured exercise band is at your right / left side.  Bend your elbow 90 degrees. Place a folded towel or small pillow under your right / left arm so that your elbow is a few inches away from your side.  Keeping the tension on the exercise band, pull it across your body, toward your stomach. Be sure to keep your body steady,  so that the movement is coming only from your rotating shoulder.  Hold for __________ seconds. Release the tension in a controlled manner, as you return to the starting position. Repeat __________ times. Complete this exercise __________ times per day.  STRENGTH - Scapular Protractors, Standing   Stand arms length away from a wall. Place your hands on the wall, keeping your elbows straight.  Begin by dropping your shoulder blades down and toward your mid-back spine.  To strengthen your protractors, keep your shoulder blades down, but slide them forward on your rib cage. It will feel as if you are lifting the back of your rib cage away from the wall. This is a subtle motion and can be challenging to complete. Ask your caregiver for further instruction, if you are not sure you are doing the exercise correctly.  Hold for __________ seconds. Slowly return to the starting position, resting the muscles completely before starting the next repetition. Repeat __________ times. Complete this exercise __________ times per day. STRENGTH - Scapular Protractors, Supine  Lie on your back on a firm surface. Extend your right / left arm straight into the air while holding a __________ weight in your hand.  Keeping your head and back in place, lift your shoulder off the floor.  Hold for __________ seconds. Slowly return to the starting position, and allow your muscles to relax completely before starting the next repetition. Repeat __________ times. Complete this exercise __________ times per day. STRENGTH - Scapular Protractors, Quadruped  Get onto your hands and knees, with your shoulders directly over your hands (or as close as you can be, comfortably).  Keeping your elbows locked, lift the back of your rib cage up into your shoulder blades, so your mid-back rounds out. Keep your neck muscles relaxed.  Hold this position for __________ seconds. Slowly return to the starting position and allow your  muscles to relax completely before starting the next repetition. Repeat __________ times. Complete this exercise __________ times per day.  STRENGTH - Scapular Retractors  Secure a rubber exercise band or tubing to a fixed object (table, pole), so that it is at the height of your shoulders when  you are either standing, or sitting on a firm armless chair.  With a palm down grip, grasp an end of the band in each hand. Straighten your elbows and lift your hands straight in front of you, at shoulder height. Step back, away from the secured end of the band, until it becomes tense.  Squeezing your shoulder blades together, draw your elbows back toward your sides, as you bend them. Keep your upper arms lifted away from your body throughout the exercise.  Hold for __________ seconds. Slowly ease the tension on the band, as you reverse the directions and return to the starting position. Repeat __________ times. Complete this exercise __________ times per day. STRENGTH - Shoulder Extensors   Secure a rubber exercise band or tubing to a fixed object (table, pole) so that it is at the height of your shoulders when you are either standing, or sitting on a firm armless chair.  With a thumbs-up grip, grasp an end of the band in each hand. Straighten your elbows and lift your hands straight in front of you, at shoulder height. Step back, away from the secured end of the band, until it becomes tense.  Squeezing your shoulder blades together, pull your hands down to the sides of your thighs. Do not allow your hands to go behind you.  Hold for __________ seconds. Slowly ease the tension on the band, as you reverse the directions and return to the starting position. Repeat __________ times. Complete this exercise __________ times per day.  STRENGTH - Scapular Retractors and External Rotators   Secure a rubber exercise band or tubing to a fixed object (table, pole) so that it is at the height as your shoulders,  when you are either standing, or sitting on a firm armless chair.  With a palm down grip, grasp an end of the band in each hand. Bend your elbows 90 degrees and lift your elbows to shoulder height, at your sides. Step back, away from the secured end of the band, until it becomes tense.  Squeezing your shoulder blades together, rotate your shoulders so that your upper arms and elbows remain stationary, but your fists travel upward to head height.  Hold for __________ seconds. Slowly ease the tension on the band, as you reverse the directions and return to the starting position. Repeat __________ times. Complete this exercise __________ times per day.  STRENGTH - Scapular Retractors and External Rotators, Rowing   Secure a rubber exercise band or tubing to a fixed object (table, pole) so that it is at the height of your shoulders, when you are either standing, or sitting on a firm armless chair.  With a palm down grip, grasp an end of the band in each hand. Straighten your elbows and lift your hands straight in front of you, at shoulder height. Step back, away from the secured end of the band, until it becomes tense.  Step 1: Squeeze your shoulder blades together. Bending your elbows, draw your hands to your chest, as if you are rowing a boat. At the end of this motion, your hands and elbow should be at shoulder height and your elbows should be out to your sides.  Step 2: Rotate your shoulders, to raise your hands above your head. Your forearms should be vertical and your upper arms should be horizontal.  Hold for __________ seconds. Slowly ease the tension on the band, as you reverse the directions and return to the starting position. Repeat __________ times. Complete this exercise  __________ times per day.  STRENGTH  Scapular Depressors  Find a sturdy chair without wheels, such as a dining room chair.  Keeping your feet on the floor, and your hands on the chair arms, lift your bottom up from  the seat, and lock your elbows.  Keeping your elbows straight, allow gravity to pull your body weight down. Your shoulders will rise toward your ears.  Raise your body against gravity by drawing your shoulder blades down your back, shortening the distance between your shoulders and ears. Although your feet should always maintain contact with the floor, your feet should progressively support less body weight, as you get stronger.  Hold for __________ seconds. In a controlled and slow manner, lower your body weight to begin the next repetition. Repeat __________ times. Complete this exercise __________ times per day.  Document Released: 10/23/2005 Document Revised: 01/15/2012 Document Reviewed: 02/04/2009 [facility] Patient Information 2013 [place], [place].

## 2012-10-21 NOTE — Assessment & Plan Note (Addendum)
Rotator cuff syndrome. Gave exercises to improve posture and strength in shoulders (empty can and pulling shoulder blades together). Also Rx for mobic and flexeril. Referral to PT for strengthening.  Follow up in 1 month.

## 2012-11-04 ENCOUNTER — Ambulatory Visit: Payer: No Typology Code available for payment source | Attending: Family Medicine | Admitting: Physical Therapy

## 2012-11-11 ENCOUNTER — Ambulatory Visit: Payer: No Typology Code available for payment source

## 2012-11-15 ENCOUNTER — Ambulatory Visit: Payer: No Typology Code available for payment source | Admitting: Family Medicine

## 2013-02-03 ENCOUNTER — Ambulatory Visit (INDEPENDENT_AMBULATORY_CARE_PROVIDER_SITE_OTHER): Payer: No Typology Code available for payment source | Admitting: Family Medicine

## 2013-02-03 ENCOUNTER — Encounter: Payer: Self-pay | Admitting: Family Medicine

## 2013-02-03 VITALS — BP 121/80 | HR 68 | Ht 62.0 in | Wt 219.0 lb

## 2013-02-03 DIAGNOSIS — S060X0A Concussion without loss of consciousness, initial encounter: Secondary | ICD-10-CM

## 2013-02-03 DIAGNOSIS — S060X9A Concussion with loss of consciousness of unspecified duration, initial encounter: Secondary | ICD-10-CM | POA: Insufficient documentation

## 2013-02-03 DIAGNOSIS — S060XAA Concussion with loss of consciousness status unknown, initial encounter: Secondary | ICD-10-CM | POA: Insufficient documentation

## 2013-02-03 NOTE — Progress Notes (Signed)
  Subjective:    Patient ID: Deanna Scott, female    DOB: 07/29/1971, 42 y.o.   MRN: [id number]  HPI 1. Fall:  Reports having fall while getting out of shower two days ago.  Hit her head on the wall of the shower.  Had headache initially but that has resolved.  Now with feelings of dizziness and nausea.  Describes dizzy feeling as that of room spinning. She denies any loss of consciousness, difficulty walking, vomiting, vision disturbance or weakness, memory loss, difficulty sleeping.  She has felt tired and fatigued since the event happened.     Review of Systems Per HPI    Objective:   Physical Exam  Constitutional: She is oriented to person, place, and time. No distress.  HENT:  Head: Normocephalic and atraumatic.  Eyes: EOM are normal. Pupils are equal, round, and reactive to light.  Neck: Normal range of motion. Neck supple.  Neurological: She is alert and oriented to person, place, and time.  No cranial nerve deficits. Strength is 5/5 throughout Coordination is normal Rhomberg is negative No dysmetria or problems with rapid alternating movements. She is able to perform three item recall.           Assessment & Plan:

## 2013-02-03 NOTE — Patient Instructions (Addendum)
I am concerned you may have a concussion. Try to rest as much as possible over the next few day and be sure to drink plenty of fluids If you develop headache, neurological changes, or have vomiting call our office. Follow up with your primary doctor in 1 week  Concussion and Brain Injury A blow or jolt to the head can disrupt the normal function of the brain. This type of brain injury is often called a "concussion" or a "closed head injury." Concussions are usually not life-threatening. Even so, the effects of a concussion can be serious.  CAUSES  A concussion is caused by a blunt blow to the head. The blow might be direct or indirect as described below.  Direct blow (running into another player during a soccer game, being hit in a fight, or hitting your head on a hard surface).  Indirect blow (when your head moves rapidly and violently back and forth like in a car crash). SYMPTOMS  The brain is very complex. Every head injury is different. Some symptoms may appear right away. Other symptoms may not show up for days or weeks after the concussion. The signs of concussion can be hard to notice. Early on, problems may be missed by patients, family members, and caregivers. You may look fine even though you are acting or feeling differently.  These symptoms are usually temporary, but may last for days, weeks, or even longer. Symptoms include:  Mild headaches that will not go away.  Having more trouble than usual with:  Remembering things.  Paying attention or concentrating.  Organizing daily tasks.  Making decisions and solving problems.  Slowness in thinking, acting, speaking, or reading.  Getting lost or easily confused.  Feeling tired all the time or lacking energy (fatigue).  Feeling drowsy.  Sleep disturbances.  Sleeping more than usual.  Sleeping less than usual.  Trouble falling asleep.  Trouble sleeping (insomnia).  Loss of balance or feeling lightheaded or  dizzy.  Nausea or vomiting.  Numbness or tingling.  Increased sensitivity to:  Sounds.  Lights.  Distractions. Other symptoms might include:  Vision problems or eyes that tire easily.  Diminished sense of taste or smell.  Ringing in the ears.  Mood changes such as feeling sad, anxious, or listless.  Becoming easily irritated or angry for little or no reason.  Lack of motivation. DIAGNOSIS  Your caregiver can usually diagnose a concussion or mild brain injury based on your description of your injury and your symptoms.  Your evaluation might include:  A brain scan to look for signs of injury to the brain. Even if the test shows no injury, you may still have a concussion.  Blood tests to be sure other problems are not present. TREATMENT   People with a concussion need to be examined and evaluated. Most people with concussions are treated in an emergency department, urgent care, or clinic. Some people must stay in the hospital overnight for further treatment.  Your caregiver will send you home with important instructions to follow. Be sure to carefully follow them.  Tell your caregiver if you are already taking any medicines (prescription, over-the-counter, or natural remedies), or if you are drinking alcohol or taking illegal drugs. Also, talk with your caregiver if you are taking blood thinners (anticoagulants) or aspirin. These drugs may increase your chances of complications. All of this is important information that may affect treatment.  Only take over-the-counter or prescription medicines for pain, discomfort, or fever as directed by your caregiver.  PROGNOSIS  How fast people recover from brain injury varies from person to person. Although most people have a good recovery, how quickly they improve depends on many factors. These factors include how severe their concussion was, what part of the brain was injured, their age, and how healthy they were before the concussion.   Because all head injuries are different, so is recovery. Most people with mild injuries recover fully. Recovery can take time. In general, recovery is slower in older persons. Also, persons who have had a concussion in the past or have other medical problems may find that it takes longer to recover from their current injury. Anxiety and depression may also make it harder to adjust to the symptoms of brain injury. HOME CARE INSTRUCTIONS  Return to your normal activities slowly, not all at once. You must give your body and brain enough time for recovery.  Get plenty of sleep at night, and rest during the day. Rest helps the brain to heal.  Avoid staying up late at night.  Keep the same bedtime hours on weekends and weekdays.  Take daytime naps or rest breaks when you feel tired.  Limit activities that require a lot of thought or concentration (brain or cognitive rest). This includes:  Homework or job-related work.  Watching TV.  Computer work.  Avoid activities that could lead to a second brain injury, such as contact or recreational sports, until your caregiver says it is okay. Even after your brain injury has healed, you should protect yourself from having another concussion.  Ask your caregiver when you can return to your normal activities such as driving, bicycling, or operating heavy equipment. Your ability to react may be slower after a brain injury.  Talk with your caregiver about when you can return to work or school.  Inform your teachers, school nurse, school counselor, coach, Event organiser, or work Production designer, theatre/television/film about your injury, symptoms, and restrictions. They should be instructed to report:  Increased problems with attention or concentration.  Increased problems remembering or learning new information.  Increased time needed to complete tasks or assignments.  Increased irritability or decreased ability to cope with stress.  Increased symptoms.  Take only those  medicines that your caregiver has approved.  Do not drink alcohol until your caregiver says you are well enough to do so. Alcohol and certain other drugs may slow your recovery and can put you at risk of further injury.  If it is harder than usual to remember things, write them down.  If you are easily distracted, try to do one thing at a time. For example, do not try to watch TV while fixing dinner.  Talk with family members or close friends when making important decisions.  Keep all follow-up appointments. Repeated evaluation of your symptoms is recommended for your recovery. PREVENTION  Protect your head from future injury. It is very important to avoid another head or brain injury before you have recovered. In rare cases, another injury has lead to permanent brain damage, brain swelling, or death. Avoid injuries by using:  Seatbelts when riding in a car.  Alcohol only in moderation.  A helmet when biking, skiing, skateboarding, skating, or doing similar activities.  Safety measures in your home.  Remove clutter and tripping hazards from floors and stairways.  Use grab bars in bathrooms and handrails by stairs.  Place non-slip mats on floors and in bathtubs.  Improve lighting in dim areas. SEEK MEDICAL CARE IF:  A head injury can cause  lingering symptoms. You should seek medical care if you have any of the following symptoms for more than 3 weeks after your injury or are planning to return to sports:  Chronic headaches.  Dizziness or balance problems.  Nausea.  Vision problems.  Increased sensitivity to noise or light.  Depression or mood swings.  Anxiety or irritability.  Memory problems.  Difficulty concentrating or paying attention.  Sleep problems.  Feeling tired all the time. SEEK IMMEDIATE MEDICAL CARE IF:  You have had a blow or jolt to the head and you (or your family or friends) notice:  Severe or worsening headaches.  Weakness (even if only in  one hand or one leg or one part of the face), numbness, or decreased coordination.  Repeated vomiting.  Increased sleepiness or passing out.  One black center of the eye (pupil) is larger than the other.  Convulsions (seizures).  Slurred speech.  Increasing confusion, restlessness, agitation, or irritability.  Lack of ability to recognize people or places.  Neck pain.  Difficulty being awakened.  Unusual behavior changes.  Loss of consciousness. Older adults with a brain injury may have a higher risk of serious complications such as a blood clot on the brain. Headaches that get worse or an increase in confusion are signs of this complication. If these signs occur, see a caregiver right away. MAKE SURE YOU:   Understand these instructions.  Will watch your condition.  Will get help right away if you are not doing well or get worse. FOR MORE INFORMATION  Several groups help people with brain injury and their families. They provide information and put people in touch with local resources. These include support groups, rehabilitation services, and a variety of health care professionals. Among these groups, the Brain Injury Association (BIA, www.biausa.org) has a Secretary/administrator that gathers scientific and educational information and works on a national level to help people with brain injury.  Document Released: 01/13/2004 Document Revised: 01/15/2012 Document Reviewed: 06/10/2008 [facility] Patient Information 2013 [place], [place].

## 2013-02-03 NOTE — Assessment & Plan Note (Signed)
Has symptoms of mild concussion including fatigue, nausea and dizziness.  Will not image as she has no deficits on neurological exam and no LOC.  Rest as much as possible and stay hydrated.  Discussed red flags and instructed to follow up in 1 week.

## 2013-02-04 ENCOUNTER — Telehealth: Payer: Self-pay | Admitting: Family Medicine

## 2013-02-04 NOTE — Telephone Encounter (Signed)
Patient wants to know if she can take "[employer] brand allergy medicine."  Spoke with Dr. Guerry Minors patient avoid Benadryl or any decongestants.  Patient is unable to read the bottle and will consult with pharmacist at [facility].  Gaylene Brooks, RN

## 2013-02-04 NOTE — Telephone Encounter (Signed)
Patient was diagnosed with a concussion yesterday, today, she has throbbing on one side of her head and she also has drainage which she believes is her allergies and she wants to know if it is ok to take allergy medicine with her concussion.

## 2013-02-06 ENCOUNTER — Ambulatory Visit (INDEPENDENT_AMBULATORY_CARE_PROVIDER_SITE_OTHER): Payer: No Typology Code available for payment source | Admitting: Family Medicine

## 2013-02-06 ENCOUNTER — Encounter: Payer: Self-pay | Admitting: Family Medicine

## 2013-02-06 VITALS — BP 127/79 | HR 69 | Temp 98.2°F | Ht 62.0 in | Wt 218.0 lb

## 2013-02-06 DIAGNOSIS — S060X0S Concussion without loss of consciousness, sequela: Secondary | ICD-10-CM

## 2013-02-06 DIAGNOSIS — R51 Headache: Secondary | ICD-10-CM

## 2013-02-06 DIAGNOSIS — R519 Headache, unspecified: Secondary | ICD-10-CM | POA: Insufficient documentation

## 2013-02-06 DIAGNOSIS — S069X9S Unspecified intracranial injury with loss of consciousness of unspecified duration, sequela: Secondary | ICD-10-CM

## 2013-02-06 DIAGNOSIS — R42 Dizziness and giddiness: Secondary | ICD-10-CM | POA: Insufficient documentation

## 2013-02-06 MED ORDER — MECLIZINE HCL 12.5 MG PO TABS: 12.5000 mg | ORAL_TABLET | Freq: Two times a day (BID) | ORAL | Status: DC | PRN

## 2013-02-06 NOTE — Assessment & Plan Note (Signed)
Part of post-concussion syndrome. Asymptomatic today. No neurologic deficit,no sign of meningeal irritation. Paradoxically, headache prevalence, duration, and severity is greater in those with mild head injury compared with those with more severe trauma. As discussed with patient,headache might worsen with stress considering the fact that she went right back to work after fall without a period of rest before return to work. I do not think imaging is required at this time. I recommended rest,letter given to stay off work for 1 wk. Motrin prn pain. Red flag sign and instruction given,she is to call or go to the ER as soon as possible. She came with her daughter who will help monitor her at home. RTC next Wednesday.

## 2013-02-06 NOTE — Patient Instructions (Signed)
Concussion Head Injury, Adult You have had a head injury that does not appear serious at this time. A concussion is a state of changed mental ability, usually from a blow to the head. You should take clear liquids for the rest of the day and then resume your regular diet. You should not take sedatives or alcoholic beverages for as long as directed by your caregiver after discharge. After injuries such as yours, most problems occur within the first 24 hours. SYMPTOMS These minor symptoms may be experienced after discharge:  Memory difficulties.  Dizziness.  Headaches.  Double vision.  Hearing difficulties.  Depression.  Tiredness.  Weakness.  Difficulty with concentration. If you experience any of these problems, you should not be alarmed. A concussion requires a few days for recovery. Many patients with head injuries frequently experience such symptoms. Usually, these problems disappear without medical care. If symptoms last for more than one day, notify your caregiver. See your caregiver sooner if symptoms are becoming worse rather than better. HOME CARE INSTRUCTIONS   During the next 24 hours you must stay with someone who can watch you for the warning signs listed below. Although it is unlikely that serious side effects will occur, you should be aware of signs and symptoms which may necessitate your return to this location. Side effects may occur up to 7  10 days following the injury. It is important for you to carefully monitor your condition and contact your caregiver or seek immediate medical attention if there is a change in your condition. SEEK IMMEDIATE MEDICAL CARE IF:   There is confusion or drowsiness.  You can not awaken the injured person.  There is nausea (feeling sick to your stomach) or continued, forceful vomiting.  You notice dizziness or unsteadiness which is getting worse, or inability to walk.  You have convulsions or unconsciousness.  You experience  severe, persistent headaches not relieved by over-the-counter or prescription medicines for pain. (Do not take aspirin as this impairs clotting abilities). Take other pain medications only as directed.  You can not use arms or legs normally.  There is clear or bloody discharge from the nose or ears. MAKE SURE YOU:   Understand these instructions.  Will watch your condition.  Will get help right away if you are not doing well or get worse. Document Released: 10/23/2005 Document Revised: 01/15/2012 Document Reviewed: 09/10/2009 [facility] Patient Information 2013 [place], [place].

## 2013-02-06 NOTE — Progress Notes (Signed)
Subjective:     Patient ID: Deanna Scott, female   DOB: Jul 14, 1971, 42 y.o.   MRN: [id number]  Headache  Chronicity: Here for follow up from last visit for headache,which started after she lost her balance in her bath tube and hit her head 6 days ago. The problem has been waxing and waning. The pain is located in the occipital region. The quality of the pain is described as aching. The pain is at a severity of 6/10. Associated symptoms include dizziness, neck pain and scalp tenderness. Pertinent negatives include no abdominal pain, abnormal behavior, anorexia, blurred vision, ear pain, fever, numbness, phonophobia, photophobia, seizures, tinnitus, visual change, vomiting or weakness. The symptoms are aggravated by activity. She has tried Excedrin for the symptoms. The treatment provided significant relief. Her past medical history is significant for migraine headaches and recent head traumas.  Her last headache was last nigh,she has not had any headache today,at times she will feel nauseous but no vomiting,resting improves her headache in addition to Excedrin.Denies limb weakness. Dizziness:C/O dizziness on and off since she hit her head,this is triggered by stress or activity at work and certain head movement,yesterday after work she felt dizzy which resolved after rest,she denies fall,no vision change,no hearing loss,no new fall,no LOC.  Past Medical History  Diagnosis Date  . Allergy     latex  . Abnormal Pap smear   . Seasonal allergies   . Shortness of breath     with exercise - smoker  . Migraines     otc meds prn  . Shoulder pain, right     otc meds  . Plantar fasciitis, bilateral      Review of Systems  Constitutional: Negative for fever.  HENT: Positive for neck pain. Negative for ear pain, neck stiffness and tinnitus.   Eyes: Negative for blurred vision, photophobia and visual disturbance.  Cardiovascular: Negative.   Gastrointestinal: Negative.  Negative for vomiting,  abdominal pain and anorexia.  Neurological: Positive for dizziness and headaches. Negative for seizures, syncope, speech difficulty, weakness and numbness.  All other systems reviewed and are negative.   Filed Vitals:   02/06/13 1400  BP: 127/79  Pulse: 69  Temp: 98.2 F (36.8 C)  TempSrc: Oral  Height: 5\' 2"  (1.575 m)  Weight: 218 lb (98.884 kg)       Objective:   Physical Exam  Vitals reviewed. Constitutional: She is oriented to person, place, and time. She appears well-developed. No distress.  HENT:  Head: Normocephalic.  Eyes: EOM are normal. Pupils are equal, round, and reactive to light. Right conjunctiva is not injected. Left conjunctiva is not injected.  Neck: Full passive range of motion without pain. Neck supple. No rigidity. No Brudzinski's sign and no Kernig's sign noted.  Cardiovascular: Normal rate, regular rhythm, normal heart sounds and normal pulses.   Pulmonary/Chest: Effort normal and breath sounds normal. She has no wheezes. She has no rhonchi.  Abdominal: Soft. Normal appearance and bowel sounds are normal. There is no tenderness.  Musculoskeletal:       Cervical back: Normal.  Neurological: She is alert and oriented to person, place, and time. She has normal strength and normal reflexes. She is not disoriented. No cranial nerve deficit or sensory deficit. She displays a negative Romberg sign. GCS eye subscore is 4. GCS verbal subscore is 5. GCS motor subscore is 6.           Assessment/Plan:     More than 25 min spent on direct face to  face encounter and coordination of care.

## 2013-02-06 NOTE — Assessment & Plan Note (Signed)
This might be post concussion dizziness/Vertigo. She felt dizzy when she got up from lying down position. I recommended rest but she insisted getting medication. I prescribed low dose Meclizine prn dizziness,as discussed with her she will only use if symptom is severe. RCT Next Wednesday for reassessment or sooner if symptom worsen.

## 2013-02-06 NOTE — Assessment & Plan Note (Signed)
Mild concussion. Condition stable Neurologic exam done today is benign. I recommended rest at home for 1 wk. RTC next Wednesday.

## 2013-02-12 ENCOUNTER — Ambulatory Visit (INDEPENDENT_AMBULATORY_CARE_PROVIDER_SITE_OTHER): Payer: No Typology Code available for payment source | Admitting: Family Medicine

## 2013-02-12 ENCOUNTER — Encounter: Payer: Self-pay | Admitting: Family Medicine

## 2013-02-12 VITALS — BP 117/87 | HR 93 | Ht 62.0 in | Wt 220.1 lb

## 2013-02-12 DIAGNOSIS — R51 Headache: Secondary | ICD-10-CM

## 2013-02-12 NOTE — Patient Instructions (Addendum)
Be sure to continue to rest and stay well hydrated Follow up in one week  Post-Concussion Syndrome Post-concussion syndrome means you have problems after a head injury. The problems can last for weeks or months. The problems usually go away on their own over time. HOME CARE   Only take medicines as told by your doctor. Do not take aspirin.  Sleep with your head raised (elevated) to help with headaches.  Avoid activities that can cause another head injury. Do not play football, hockey, do martial arts, or ride horses until your doctor says it is okay.  Keep all doctor visits as told. GET HELP RIGHT AWAY IF:  You feel confused or very sleepy.  You cannot wake the injured person.  You feel sick to your stomach (nauseous) or keep throwing up (vomiting).  You feel like you are moving when you are not (vertigo).  You notice the injured person's eyes moving back and forth very fast.  You start shaking (convulsions) or pass out (faint).  You have very bad headaches that do not get better with medicine.  You cannot use your arms or legs normally.  The black center of your eyes (pupils) change size.  You have clear or bloody fluid coming from your nose or ears.  Your problems get worse, not better. MAKE SURE YOU:  Understand these instructions.  Will watch your condition.  Will get help right away if you are not doing well or get worse. Document Released: 11/30/2004 Document Revised: 01/15/2012 Document Reviewed: 05/11/2011 [facility] Patient Information 2013 [place], [place].

## 2013-02-13 ENCOUNTER — Telehealth: Payer: Self-pay | Admitting: Family Medicine

## 2013-02-13 NOTE — Telephone Encounter (Signed)
Called patient and let her know that she can take ibuprofen 800g q8hrs as needed for her frozen shoulder pain and that this should not affect her post-concussion symptoms. Let her know that I couldn't think of a reason that would make the herbal tea unsafe after her having had a concussion.  Patient expressed understanding.   Marena Chancy, PGY-2 Family Medicine Resident

## 2013-02-13 NOTE — Telephone Encounter (Signed)
Will fwd. To PCP for review .Toini Failla  

## 2013-02-13 NOTE — Telephone Encounter (Signed)
Patient wants to know if she cant take Excedrin opposite of the Ibuprofen when she has bad headaches from the concussion.  Also, she has an Herbal tea that she would like to drink to help relax the pain from her shoulder.

## 2013-02-16 NOTE — Progress Notes (Signed)
  Subjective:    Patient ID: Denay Pleitez, female    DOB: 1971/05/20, 42 y.o.   MRN: [id number]  HPI 1. F/u Concussion:  Here for follow up of concussion.  Larey Seat getting out of shower on 3/29.  Has had intermittent headaches and dizziness since that time.  Has take the past week off of work and rested.  Has felt more tired recently as well.  Overall she feels that her symptoms have significantly improved from when I first saw her and her symptoms are less frequent.  She denies any syncope or further falls, focal weakness.     Review of Systems Per HPI    Objective:   Physical Exam  Constitutional: She is oriented to person, place, and time. No distress.  Obese female, nad   HENT:  Head: Normocephalic and atraumatic.  Eyes: Pupils are equal, round, and reactive to light.  Neck: Neck supple.  Neurological: She is alert and oriented to person, place, and time. She has normal strength. No cranial nerve deficit. She displays a negative Romberg sign. Coordination and gait normal.          Assessment & Plan:

## 2013-02-16 NOTE — Assessment & Plan Note (Addendum)
Likely continued post-concussive syndrome.  Neurological exam is non-focal with no difficulty in coordination.  Discussed that this can be typical course for post concussive disorder and that it is reassuring that her symptoms are improving.  Follow up in one week.

## 2013-02-26 ENCOUNTER — Ambulatory Visit (INDEPENDENT_AMBULATORY_CARE_PROVIDER_SITE_OTHER): Payer: No Typology Code available for payment source | Admitting: Family Medicine

## 2013-02-26 ENCOUNTER — Encounter: Payer: Self-pay | Admitting: Family Medicine

## 2013-02-26 VITALS — BP 122/84 | HR 78 | Ht 62.0 in | Wt 222.0 lb

## 2013-02-26 DIAGNOSIS — S060X0D Concussion without loss of consciousness, subsequent encounter: Secondary | ICD-10-CM

## 2013-02-26 DIAGNOSIS — Z5189 Encounter for other specified aftercare: Secondary | ICD-10-CM

## 2013-02-26 NOTE — Assessment & Plan Note (Signed)
Symptoms have resolved at this point.  Neurological exam normal.  No need for further follow up for this condition.

## 2013-02-26 NOTE — Progress Notes (Signed)
  Subjective:    Patient ID: Deanna Scott, female    DOB: 01/15/1971, 42 y.o.   MRN: [id number]  HPI 1. Follow up concussion:  Here for follow up of post concussion syndrome.  She reports that all of her symptoms have completely resolved.  She has returned to work and has not had any difficulty with dizziness or headaches.  She denies vision changes, nausea, fatigue.     Review of Systems Per HPI    Objective:   Physical Exam  Constitutional: She is oriented to person, place, and time. She appears well-nourished. No distress.  HENT:  Head: Normocephalic and atraumatic.  Neurological: She is alert and oriented to person, place, and time. She has normal strength. No cranial nerve deficit. Coordination and gait normal.          Assessment & Plan:

## 2013-10-14 ENCOUNTER — Ambulatory Visit: Payer: Self-pay

## 2014-01-16 ENCOUNTER — Telehealth: Payer: Self-pay | Admitting: Family Medicine

## 2014-01-16 ENCOUNTER — Ambulatory Visit (INDEPENDENT_AMBULATORY_CARE_PROVIDER_SITE_OTHER): Payer: No Typology Code available for payment source | Admitting: Family Medicine

## 2014-01-16 ENCOUNTER — Encounter: Payer: Self-pay | Admitting: Family Medicine

## 2014-01-16 VITALS — BP 120/83 | HR 92 | Temp 98.1°F | Wt 222.0 lb

## 2014-01-16 DIAGNOSIS — N898 Other specified noninflammatory disorders of vagina: Secondary | ICD-10-CM | POA: Insufficient documentation

## 2014-01-16 DIAGNOSIS — N760 Acute vaginitis: Secondary | ICD-10-CM

## 2014-01-16 LAB — POCT WET PREP (WET MOUNT): Clue Cells Wet Prep Whiff POC: POSITIVE

## 2014-01-16 MED ORDER — FLUCONAZOLE 150 MG PO TABS: 150.0000 mg | ORAL_TABLET | Freq: Once | ORAL | Status: DC

## 2014-01-16 MED ORDER — METRONIDAZOLE 500 MG PO TABS: 500.0000 mg | ORAL_TABLET | Freq: Two times a day (BID) | ORAL | Status: DC

## 2014-01-16 NOTE — Assessment & Plan Note (Signed)
Appears like a yeast on exam but will send a wet prep before treating. Patient reports not being sexually active for over 2 years and I will therefore not check GC and Chlamydia at this time. Would consider rechecking if symptoms not improved.  

## 2014-01-16 NOTE — Telephone Encounter (Signed)
Called patient letting her know that wet prep showed bacterial vaginosis.  Will send flagyl 500mg  bid for 7 days.  Also sending Rx for diflucan as patient reports that she normally gets yeast infections after this treatment.   Liam Graham, PGY-3 Family Medicine Resident

## 2014-01-16 NOTE — Progress Notes (Signed)
Patient ID: Tiearra Colwell    DOB: 04/20/1971, 43 y.o.   MRN: [id number] --- Subjective:  Xan is a 43 y.o.female who presents with vaginal discharge. Started 1-1/2 week ago. She describes it as brownish whitish color. It has an odor to it. She has had some associated burning. No Derstine pain. No dysuria. She had a total vaginal hysterectomy 2 years ago for symptomatic fibroids.  She is not currently sexually active and has not been in 2 years. She is not concerned about STDs at this time. She has been using vaginal wipes for the last 1-2 weeks.  ROS: see HPI Past Medical History: reviewed and updated medications and allergies. Social History: Tobacco: None  Objective: Filed Vitals:   01/16/14 1539  BP: 120/83  Pulse: 92  Temp: 98.1 F (36.7 C)    Physical Examination:   General appearance - alert, well appearing, and in no distress Pelvic exam: normal external genitalia, vulva, vagina, cervix and uterus absent.  White creamy discharge present.  Abdomen - non tender, non distended

## 2014-01-16 NOTE — Patient Instructions (Signed)
I will call you with the results of the tests.

## 2014-02-14 ENCOUNTER — Emergency Department (HOSPITAL_COMMUNITY)
Admission: EM | Admit: 2014-02-14 | Discharge: 2014-02-14 | Disposition: A | Discharge status: Home / Self Care | Payer: No Typology Code available for payment source | Source: Home / Self Care | Attending: Family Medicine | Admitting: Family Medicine

## 2014-02-14 ENCOUNTER — Ambulatory Visit (HOSPITAL_COMMUNITY)
Admit: 2014-02-14 | Discharge: 2014-02-14 | Disposition: A | Discharge status: Home / Self Care | Payer: No Typology Code available for payment source | Source: Ambulatory Visit | Attending: Family Medicine | Admitting: Family Medicine

## 2014-02-14 ENCOUNTER — Encounter (HOSPITAL_COMMUNITY): Payer: Self-pay | Admitting: Emergency Medicine

## 2014-02-14 DIAGNOSIS — M79609 Pain in unspecified limb: Secondary | ICD-10-CM

## 2014-02-14 DIAGNOSIS — I8390 Asymptomatic varicose veins of unspecified lower extremity: Secondary | ICD-10-CM

## 2014-02-14 NOTE — ED Notes (Signed)
Pt transported to Xray for ultrasound

## 2014-02-14 NOTE — ED Provider Notes (Signed)
Deanna Scott is a 43 y.o. female who presents to Urgent Care today for right calf pain. Patient has one day of right calf pain. She denies any injury. She notes pain swelling and bruising of the medial superior calf. She denies any cough congestion or shortness of breath. She has not tried any medications. She feels well otherwise.    Past Medical History  Diagnosis Date  . Allergy     latex  . Abnormal Pap smear   . Seasonal allergies   . Shortness of breath     with exercise - smoker  . Migraines     otc meds prn  . Shoulder pain, right     otc meds  . Plantar fasciitis, bilateral    History  Substance Use Topics  . Smoking status: Current Every Day Smoker -- 1.00 packs/day for 29 years    Types: Cigarettes  . Smokeless tobacco: Never Used  . Alcohol Use: 1.0 oz/week    2 drink(s) per week     Comment: socially   ROS as above Medications: No current facility-administered medications for this encounter.   Current Outpatient Prescriptions  Medication Sig Dispense Refill  . aspirin-acetaminophen-caffeine (EXCEDRIN MIGRAINE) 250-250-65 MG per tablet Take 1 tablet by mouth every 6 (six) hours as needed for pain.      Marland Kitchen loratadine (CLARITIN) 10 MG tablet Take 10 mg by mouth daily.      . ranitidine (ZANTAC) 75 MG tablet Take 75 mg by mouth 2 (two) times daily.      . cyclobenzaprine (FLEXERIL) 10 MG tablet Take 1 tablet (10 mg total) by mouth 2 (two) times daily as needed for muscle spasms.  60 tablet  0  . fluconazole (DIFLUCAN) 150 MG tablet Take 1 tablet (150 mg total) by mouth once.  1 tablet  0    Exam:  BP 130/78  Pulse 78  Temp(Src) 97.7 F (36.5 C) (Oral)  Resp 14  SpO2 100%  LMP 03/02/2012 Gen: Well NAD Right calf: Mildly swollen and tender to palpation.  Ecchymosis present along the medial head of the gastrocnemius.   Distended varicosity present.  Capillary refill and pulses intact distally  Vascular ultrasound results preliminary leg negative for DVT.  Varicosities noted.  Assessment and Plan: 43 y.o. female with varicose veins with hematoma. Recommend compression stockings and NSAIDs. Followup with PCP  Discussed warning signs or symptoms. Please see discharge instructions. Patient expresses understanding.    Gregor Hams, MD 02/14/14 [phone]

## 2014-02-14 NOTE — Progress Notes (Signed)
VASCULAR LAB PRELIMINARY  PRELIMINARY  PRELIMINARY  PRELIMINARY  Right lower extremity venous Doppler completed.    Preliminary report:  There is no DVT or SVT noted in the right lower extremity.  There are varicosities noted throughout the calf, but no thrombosis is noted.  Iantha Fallen, RVT 02/14/2014, 1:11 PM

## 2014-02-14 NOTE — Discharge Instructions (Signed)
Thank you for coming in today. Use over-the-counter compression stockings as needed.  Varicose Veins Varicose veins are veins that have become enlarged and twisted. CAUSES This condition is the result of valves in the veins not working properly. Valves in the veins help return blood from the leg to the heart. If these valves are damaged, blood flows backwards and backs up into the veins in the leg near the skin. This causes the veins to become larger. People who are on their feet a lot, who are pregnant, or who are overweight are more likely to develop varicose veins. SYMPTOMS   Bulging, twisted-appearing, bluish veins, most commonly found on the legs.  Leg pain or a feeling of heaviness. These symptoms may be worse at the end of the day.  Leg swelling.  Skin color changes. DIAGNOSIS  Varicose veins can usually be diagnosed with an exam of your legs by your caregiver. He or she may recommend an ultrasound of your leg veins. TREATMENT  Most varicose veins can be treated at home.However, other treatments are available for people who have persistent symptoms or who want to treat the cosmetic appearance of the varicose veins. These include:  Laser treatment of very small varicose veins.  Medicine that is shot (injected) into the vein. This medicine hardens the walls of the vein and closes off the vein. This treatment is called sclerotherapy. Afterwards, you may need to wear clothing or bandages that apply pressure.  Surgery. HOME CARE INSTRUCTIONS   Do not stand or sit in one position for long periods of time. Do not sit with your legs crossed. Rest with your legs raised during the day.  Wear elastic stockings or support hose. Do not wear other tight, encircling garments around the legs, pelvis, or waist.  Walk as much as possible to increase blood flow.  Raise the foot of your bed at night with 2-inch blocks.  If you get a cut in the skin over the vein and the vein bleeds, lie down  with your leg raised and press on it with a clean cloth until the bleeding stops. Then place a bandage (dressing) on the cut. See your caregiver if it continues to bleed or needs stitches. SEEK MEDICAL CARE IF:   The skin around your ankle starts to break down.  You have pain, redness, tenderness, or hard swelling developing in your leg over a vein.  You are uncomfortable due to leg pain. Document Released: 08/02/2005 Document Revised: 01/15/2012 Document Reviewed: 12/19/2010 [facility] Patient Information 2014 [employer].

## 2014-02-14 NOTE — ED Notes (Signed)
Onset yesterday pain behind right knee.  vericose veins present with discoloration   She denies recent injury

## 2014-03-05 ENCOUNTER — Encounter (HOSPITAL_COMMUNITY): Payer: Self-pay | Admitting: Emergency Medicine

## 2014-03-05 ENCOUNTER — Emergency Department (HOSPITAL_COMMUNITY)
Admission: EM | Admit: 2014-03-05 | Discharge: 2014-03-05 | Disposition: A | Discharge status: Home / Self Care | Payer: No Typology Code available for payment source | Source: Home / Self Care | Attending: Emergency Medicine | Admitting: Emergency Medicine

## 2014-03-05 DIAGNOSIS — B9789 Other viral agents as the cause of diseases classified elsewhere: Secondary | ICD-10-CM

## 2014-03-05 DIAGNOSIS — B349 Viral infection, unspecified: Secondary | ICD-10-CM

## 2014-03-05 NOTE — ED Notes (Signed)
C/o sinuses draining since Monday. Occ. Cough, occ. SOB, fatigue, and a little dizzy.  No nausea, chills or fever.  Occ pain in L chest pinch and lasts a couple minutes onset yesterday.  Approx. 5 times/day.

## 2014-03-05 NOTE — ED Provider Notes (Signed)
CSN: [id number]     Arrival date & time 03/05/14  1724 History   First MD Initiated Contact with Patient 03/05/14 1740     Chief Complaint  Patient presents with  . Fatigue  . Sinus Problem   (Consider location/radiation/quality/duration/timing/severity/associated sxs/prior Treatment) HPI Patient is a 43 yo F presenting to [facility] for congestion, drainage and fatigue x 3 days. States the fatigue is what is bothering her the most. She states she has a little dizziness. She states she has seasonal allergies and takes Claritin, but this does not feel like her allergies. No known sick contacts. No fevers. Drinking plenty of fluids, normal appetite. She has tried DayQuil which does not help her. She states she sleeps well at night but wakes up tired which makes it difficult to work and function during the day.   Past Medical History  Diagnosis Date  . Allergy     latex  . Abnormal Pap smear   . Seasonal allergies   . Shortness of breath     with exercise - smoker  . Migraines     otc meds prn  . Shoulder pain, right     otc meds  . Plantar fasciitis, bilateral    Past Surgical History  Procedure Laterality Date  . Bladder suspension  2009  . Tubal ligation  1999  . Svd      x 2  . Dilation and curettage of uterus      hx mab  . Diagnostic laparoscopy      ectopic pregnancy  . Vaginal hysterectomy  03/13/2012    Procedure: HYSTERECTOMY VAGINAL;  Surgeon: Mora Bellman, MD;  Location: [facility];  Service: Gynecology;  Laterality: N/A;  . Abdominal hysterectomy     Family History  Problem Relation Age of Onset  . Cancer Father     stomach  . Diabetes Maternal Grandmother    History  Substance Use Topics  . Smoking status: Current Every Day Smoker -- 1.00 packs/day for 29 years    Types: Cigarettes  . Smokeless tobacco: Never Used  . Alcohol Use: 1.0 oz/week    2 drink(s) per week     Comment: socially   OB History   Grav Para Term Preterm Abortions TAB SAB Ect Mult Living   4  2 2  2  1 1  2      Review of Systems  Constitutional: Positive for fatigue. Negative for fever and chills.  HENT: Positive for congestion, postnasal drip, rhinorrhea, sinus pressure and sore throat. Negative for trouble swallowing.   Eyes: Negative for visual disturbance.  Respiratory: Negative for cough and shortness of breath.   Cardiovascular: Negative for chest pain and leg swelling.  Gastrointestinal: Negative for abdominal pain.  Genitourinary: Negative for dysuria.  Musculoskeletal: Positive for myalgias. Negative for arthralgias.  Skin: Negative for rash.  Neurological: Negative for headaches.    Allergies  Latex  Home Medications   Prior to Admission medications   Medication Sig Start Date End Date Taking? Authorizing Provider  aspirin-acetaminophen-caffeine (EXCEDRIN MIGRAINE) [phone] MG per tablet Take 1 tablet by mouth every 6 (six) hours as needed for pain.   Yes Historical Provider, MD  loratadine (CLARITIN) 10 MG tablet Take 10 mg by mouth daily.   Yes Historical Provider, MD  ranitidine (ZANTAC) 75 MG tablet Take 75 mg by mouth 2 (two) times daily.   Yes Historical Provider, MD  cyclobenzaprine (FLEXERIL) 10 MG tablet Take 1 tablet (10 mg total) by mouth 2 (two)  times daily as needed for muscle spasms. 10/21/12   Kandis Nab, MD  fluconazole (DIFLUCAN) 150 MG tablet Take 1 tablet (150 mg total) by mouth once. 01/16/14   Kandis Nab, MD   BP 126/90  Pulse 76  Temp(Src) 98.1 F (36.7 C) (Oral)  Resp 21  SpO2 98%  LMP 03/02/2012 Physical Exam  Constitutional: She is oriented to person, place, and time. She appears well-developed and well-nourished. No distress.  HENT:  Head: Normocephalic and atraumatic.  Right Ear: Tympanic membrane normal.  Left Ear: Tympanic membrane normal.  Nose: No mucosal edema.  Mouth/Throat: Mucous membranes are normal. Posterior oropharyngeal edema and posterior oropharyngeal erythema present. No oropharyngeal exudate.   Eyes: Pupils are equal, round, and reactive to light.  Neck: Normal range of motion. Neck supple.  Cardiovascular: Normal rate, regular rhythm and normal heart sounds.   No murmur heard. Pulmonary/Chest: Effort normal and breath sounds normal. She has no wheezes.  Abdominal: Soft. She exhibits no distension. There is no tenderness.  Musculoskeletal: Normal range of motion. She exhibits no edema and no tenderness.  Neurological: She is alert and oriented to person, place, and time.  Skin: Skin is warm and dry.  Psychiatric: She has a normal mood and affect.    ED Course  Procedures (including critical care time) Labs Review Labs Reviewed - No data to display  Imaging Review No results found.  MDM   1. Viral syndrome    No red flags on exam. No evidence of bacterial infection.  - Given reassurance. No need for antibiotics - Supportive care with po fluids, rest and OTC cold medications - F/u with PCP if not improved in the next 5-7 days. Consider TSH or CBC for anemia if fatigue persists. Patient agrees with plan.    Montez Morita, MD 03/05/14 [phone]

## 2014-03-05 NOTE — ED Provider Notes (Signed)
Medical screening examination/treatment/procedure(s) were performed by a resident physician and as supervising physician I was immediately available for consultation/collaboration.  Philipp Deputy, M.D.  Harden Mo, MD 03/05/14 [phone]

## 2014-03-05 NOTE — Discharge Instructions (Signed)
Viral Infections °A virus is a type of germ. Viruses can cause: °· Minor sore throats. °· Aches and pains. °· Headaches. °· Runny nose. °· Rashes. °· Watery eyes. °· Tiredness. °· Coughs. °· Loss of appetite. °· Feeling sick to your stomach (nausea). °· Throwing up (vomiting). °· Watery poop (diarrhea). °HOME CARE  °· Only take medicines as told by your doctor. °· Drink enough water and fluids to keep your pee (urine) clear or pale yellow. Sports drinks are a good choice. °· Get plenty of rest and eat healthy. Soups and broths with crackers or rice are fine. °GET HELP RIGHT AWAY IF:  °· You have a very bad headache. °· You have shortness of breath. °· You have chest pain or neck pain. °· You have an unusual rash. °· You cannot stop throwing up. °· You have watery poop that does not stop. °· You cannot keep fluids down. °· You or your child has a temperature by mouth above 102° F (38.9° C), not controlled by medicine. °· Your baby is older than 3 months with a rectal temperature of 102° F (38.9° C) or higher. °· Your baby is 3 months old or younger with a rectal temperature of 100.4° F (38° C) or higher. °MAKE SURE YOU:  °· Understand these instructions. °· Will watch this condition. °· Will get help right away if you are not doing well or get worse. °Document Released: 10/05/2008 Document Revised: 01/15/2012 Document Reviewed: 02/28/2011 °[employer]® Patient Information ©2014 [employer]. ° °

## 2014-04-22 ENCOUNTER — Ambulatory Visit: Payer: No Typology Code available for payment source

## 2014-04-27 ENCOUNTER — Ambulatory Visit: Payer: No Typology Code available for payment source

## 2014-07-13 ENCOUNTER — Encounter (HOSPITAL_COMMUNITY): Payer: Self-pay | Admitting: Emergency Medicine

## 2014-07-13 ENCOUNTER — Emergency Department (INDEPENDENT_AMBULATORY_CARE_PROVIDER_SITE_OTHER)
Admission: EM | Admit: 2014-07-13 | Discharge: 2014-07-13 | Disposition: A | Discharge status: Home / Self Care | Payer: No Typology Code available for payment source | Source: Home / Self Care

## 2014-07-13 DIAGNOSIS — K219 Gastro-esophageal reflux disease without esophagitis: Secondary | ICD-10-CM

## 2014-07-13 MED ORDER — PANTOPRAZOLE SODIUM 40 MG PO TBEC: 40.0000 mg | DELAYED_RELEASE_TABLET | Freq: Every day | ORAL | Status: DC

## 2014-07-13 NOTE — ED Notes (Signed)
Reports feeling burning sensation in throat 2 weeks ago.  Woke with burning sensation in throat, bitter taste in mouth.  .  Most recently having intermittent low abdominal pain, low back pain.  Last episode was yesterday.  Reports soft stool, but feels she has to push hard for bm.  Reports having a lot of gas.

## 2014-07-13 NOTE — ED Provider Notes (Signed)
CSN: [id number]     Arrival date & time 07/13/14  1342 History   First MD Initiated Contact with Patient 07/13/14 1427     Chief Complaint  Patient presents with  . Abdominal Pain   (Consider location/radiation/quality/duration/timing/severity/associated sxs/prior Treatment) HPI Comments: 43 year old female complaining of throat burning approximately 2 weeks ago associated with much belching, heartburn and reflux symptoms. The symptoms are worse when supine and  eating  offending foods such as greasy and spicy foods.   Past Medical History  Diagnosis Date  . Allergy     latex  . Abnormal Pap smear   . Seasonal allergies   . Shortness of breath     with exercise - smoker  . Migraines     otc meds prn  . Shoulder pain, right     otc meds  . Plantar fasciitis, bilateral    Past Surgical History  Procedure Laterality Date  . Bladder suspension  2009  . Tubal ligation  1999  . Svd      x 2  . Dilation and curettage of uterus      hx mab  . Diagnostic laparoscopy      ectopic pregnancy  . Vaginal hysterectomy  03/13/2012    Procedure: HYSTERECTOMY VAGINAL;  Surgeon: Mora Bellman, MD;  Location: [facility];  Service: Gynecology;  Laterality: N/A;  . Abdominal hysterectomy     Family History  Problem Relation Age of Onset  . Cancer Father     stomach  . Diabetes Maternal Grandmother    History  Substance Use Topics  . Smoking status: Current Every Day Smoker -- 1.00 packs/day for 29 years    Types: Cigarettes  . Smokeless tobacco: Never Used  . Alcohol Use: 1.0 oz/week    2 drink(s) per week     Comment: socially   OB History   Grav Para Term Preterm Abortions TAB SAB Ect Mult Living   4 2 2  2  1 1  2      Review of Systems  Constitutional: Negative.   HENT: Negative.   Respiratory: Negative.   Cardiovascular: Negative for chest pain and palpitations.  Gastrointestinal: Negative for vomiting, abdominal pain, blood in stool and abdominal distention.       See HPI   Genitourinary: Negative.   Musculoskeletal: Negative.   Skin: Negative.   Neurological: Negative.     Allergies  Latex  Home Medications   Prior to Admission medications   Medication Sig Start Date End Date Taking? Authorizing Provider  Multiple Vitamin (MULTIVITAMIN) tablet Take 1 tablet by mouth daily.   Yes Historical Provider, MD  aspirin-acetaminophen-caffeine (EXCEDRIN MIGRAINE) [phone] MG per tablet Take 1 tablet by mouth every 6 (six) hours as needed for pain.    Historical Provider, MD  cyclobenzaprine (FLEXERIL) 10 MG tablet Take 1 tablet (10 mg total) by mouth 2 (two) times daily as needed for muscle spasms. 10/21/12   Kandis Nab, MD  fluconazole (DIFLUCAN) 150 MG tablet Take 1 tablet (150 mg total) by mouth once. 01/16/14   Kandis Nab, MD  loratadine (CLARITIN) 10 MG tablet Take 10 mg by mouth daily.    Historical Provider, MD  pantoprazole (PROTONIX) 40 MG tablet Take 1 tablet (40 mg total) by mouth daily. 07/13/14   Janne Napoleon, NP  ranitidine (ZANTAC) 75 MG tablet Take 75 mg by mouth 2 (two) times daily.    Historical Provider, MD   BP 129/87  Pulse 84  Temp(Src) 97.5 F (  36.4 C) (Oral)  Resp 16  SpO2 97%  LMP 03/02/2012 Physical Exam  Nursing note and vitals reviewed. Constitutional: She is oriented to person, place, and time. She appears well-developed and well-nourished. No distress.  Eyes: Conjunctivae and EOM are normal.  Neck: Normal range of motion. Neck supple.  Cardiovascular: Normal rate, regular rhythm and normal heart sounds.   Pulmonary/Chest: Effort normal and breath sounds normal. No respiratory distress. She has no wheezes. She has no rales.  Abdominal: Soft. Bowel sounds are normal. She exhibits no distension and no mass. There is no tenderness. There is no rebound and no guarding.  Lymphadenopathy:    She has no cervical adenopathy.  Neurological: She is alert and oriented to person, place, and time. She exhibits normal muscle tone.   Skin: Skin is warm and dry.  Psychiatric: She has a normal mood and affect.    ED Course  Procedures (including critical care time) Labs Review Labs Reviewed - No data to display  Imaging Review No results found.   MDM   1. Gastroesophageal reflux disease, esophagitis presence not specified     Continue zantac 150 bid Add Protonix 40 mg q d Instructions on diet and GERD Avoid offending goods F/U with PCP. Cnsider  H. Pylori testing and GI referral      Janne Napoleon, NP 07/13/14 1455

## 2014-07-13 NOTE — Discharge Instructions (Signed)
Food Choices for Gastroesophageal Reflux Disease When you have gastroesophageal reflux disease (GERD), the foods you eat and your eating habits are very important. Choosing the right foods can help ease the discomfort of GERD. WHAT GENERAL GUIDELINES DO I NEED TO FOLLOW?  Choose fruits, vegetables, whole grains, low-fat dairy products, and low-fat meat, fish, and poultry.  Limit fats such as oils, salad dressings, butter, nuts, and avocado.  Keep a food diary to identify foods that cause symptoms.  Avoid foods that cause reflux. These may be different for different people.  Eat frequent small meals instead of three large meals each day.  Eat your meals slowly, in a relaxed setting.  Limit fried foods.  Cook foods using methods other than frying.  Avoid drinking alcohol.  Avoid drinking large amounts of liquids with your meals.  Avoid bending over or lying down until 2-3 hours after eating. WHAT FOODS ARE NOT RECOMMENDED? The following are some foods and drinks that may worsen your symptoms: Vegetables Tomatoes. Tomato juice. Tomato and spaghetti sauce. Chili peppers. Onion and garlic. Horseradish. Fruits Oranges, grapefruit, and lemon (fruit and juice). Meats High-fat meats, fish, and poultry. This includes hot dogs, ribs, ham, sausage, salami, and bacon. Dairy Whole milk and chocolate milk. Sour cream. Cream. Butter. Ice cream. Cream cheese.  Beverages Coffee and tea, with or without caffeine. Carbonated beverages or energy drinks. Condiments Hot sauce. Barbecue sauce.  Sweets/Desserts Chocolate and cocoa. Donuts. Peppermint and spearmint. Fats and Oils High-fat foods, including [place] fries and potato chips. Other Vinegar. Strong spices, such as black pepper, white pepper, red pepper, cayenne, curry powder, cloves, ginger, and chili powder. The items listed above may not be a complete list of foods and beverages to avoid. Contact your dietitian for more  information. Document Released: 10/23/2005 Document Revised: 10/28/2013 Document Reviewed: 08/27/2013 [facility] Patient Information 2015 [place], [place]. This information is not intended to replace advice given to you by your health care provider. Make sure you discuss any questions you have with your health care provider.  Gastroesophageal Reflux Disease, Adult Gastroesophageal reflux disease (GERD) happens when acid from your stomach flows up into the esophagus. When acid comes in contact with the esophagus, the acid causes soreness (inflammation) in the esophagus. Over time, GERD may create small holes (ulcers) in the lining of the esophagus. CAUSES   Increased body weight. This puts pressure on the stomach, making acid rise from the stomach into the esophagus.  Smoking. This increases acid production in the stomach.  Drinking alcohol. This causes decreased pressure in the lower esophageal sphincter (valve or ring of muscle between the esophagus and stomach), allowing acid from the stomach into the esophagus.  Late evening meals and a full stomach. This increases pressure and acid production in the stomach.  A malformed lower esophageal sphincter. Sometimes, no cause is found. SYMPTOMS   Burning pain in the lower part of the mid-chest behind the breastbone and in the mid-stomach area. This may occur twice a week or more often.  Trouble swallowing.  Sore throat.  Dry cough.  Asthma-like symptoms including chest tightness, shortness of breath, or wheezing. DIAGNOSIS  Your caregiver may be able to diagnose GERD based on your symptoms. In some cases, X-rays and other tests may be done to check for complications or to check the condition of your stomach and esophagus. TREATMENT  Your caregiver may recommend over-the-counter or prescription medicines to help decrease acid production. Ask your caregiver before starting or adding any new medicines.  HOME  CARE INSTRUCTIONS   Change the  factors that you can control. Ask your caregiver for guidance concerning weight loss, quitting smoking, and alcohol consumption.  Avoid foods and drinks that make your symptoms worse, such as:  Caffeine or alcoholic drinks.  Chocolate.  Peppermint or mint flavorings.  Garlic and onions.  Spicy foods.  Citrus fruits, such as oranges, lemons, or limes.  Tomato-based foods such as sauce, chili, salsa, and pizza.  Fried and fatty foods.  Avoid lying down for the 3 hours prior to your bedtime or prior to taking a nap.  Eat small, frequent meals instead of large meals.  Wear loose-fitting clothing. Do not wear anything tight around your waist that causes pressure on your stomach.  Raise the head of your bed 6 to 8 inches with wood blocks to help you sleep. Extra pillows will not help.  Only take over-the-counter or prescription medicines for pain, discomfort, or fever as directed by your caregiver.  Do not take aspirin, ibuprofen, or other nonsteroidal anti-inflammatory drugs (NSAIDs). SEEK IMMEDIATE MEDICAL CARE IF:   You have pain in your arms, neck, jaw, teeth, or back.  Your pain increases or changes in intensity or duration.  You develop nausea, vomiting, or sweating (diaphoresis).  You develop shortness of breath, or you faint.  Your vomit is green, yellow, black, or looks like coffee grounds or blood.  Your stool is red, bloody, or black. These symptoms could be signs of other problems, such as heart disease, gastric bleeding, or esophageal bleeding. MAKE SURE YOU:   Understand these instructions.  Will watch your condition.  Will get help right away if you are not doing well or get worse. Document Released: 08/02/2005 Document Revised: 01/15/2012 Document Reviewed: 05/12/2011 [facility] Patient Information 2015 [place], [place]. This information is not intended to replace advice given to you by your health care provider. Make sure you discuss any questions you have  with your health care provider.  Heartburn Heartburn is a painful, burning sensation in the chest. It may feel worse in certain positions, such as lying down or bending over. It is caused by stomach acid backing up into the tube that carries food from the mouth down to the stomach (lower esophagus).  CAUSES   Large meals.  Certain foods and drinks.  Exercise.  Increased acid production.  Being overweight or obese.  Certain medicines. SYMPTOMS   Burning pain in the chest or lower throat.  Bitter taste in the mouth.  Coughing. DIAGNOSIS  If the usual treatments for heartburn do not improve your symptoms, then tests may be done to see if there is another condition present. Possible tests may include:  X-rays.  Endoscopy. This is when a tube with a light and a camera on the end is used to examine the esophagus and the stomach.  A test to measure the amount of acid in the esophagus (pH test).  A test to see if the esophagus is working properly (esophageal manometry).  Blood, breath, or stool tests to check for bacteria that cause ulcers. TREATMENT   Your caregiver may tell you to use certain over-the-counter medicines (antacids, acid reducers) for mild heartburn.  Your caregiver may prescribe medicines to decrease the acid in your stomach or protect your stomach lining.  Your caregiver may recommend certain diet changes.  For severe cases, your caregiver may recommend that the head of your bed be elevated on blocks. (Sleeping with more pillows is not an effective treatment as it only changes the  position of your head and does not improve the main problem of stomach acid refluxing into the esophagus.) HOME CARE INSTRUCTIONS   Take all medicines as directed by your caregiver.  Raise the head of your bed by putting blocks under the legs if instructed to by your caregiver.  Do not exercise right after eating.  Avoid eating 2 or 3 hours before bed. Do not lie down right  after eating.  Eat small meals throughout the day instead of 3 large meals.  Stop smoking if you smoke.  Maintain a healthy weight.  Identify foods and beverages that make your symptoms worse and avoid them. Foods you may want to avoid include:  Peppers.  Chocolate.  High-fat foods, including fried foods.  Spicy foods.  Garlic and onions.  Citrus fruits, including oranges, grapefruit, lemons, and limes.  Food containing tomatoes or tomato products.  Mint.  Carbonated drinks, caffeinated drinks, and alcohol.  Vinegar. SEEK IMMEDIATE MEDICAL CARE IF:  You have severe chest pain that goes down your arm or into your jaw or neck.  You feel sweaty, dizzy, or lightheaded.  You are short of breath.  You vomit blood.  You have difficulty or pain with swallowing.  You have bloody or black, tarry stools.  You have episodes of heartburn more than 3 times a week for more than 2 weeks. MAKE SURE YOU:  Understand these instructions.  Will watch your condition.  Will get help right away if you are not doing well or get worse. Document Released: 03/11/2009 Document Revised: 01/15/2012 Document Reviewed: 04/09/2011 [facility] Patient Information 2015 [place], [place]. This information is not intended to replace advice given to you by your health care provider. Make sure you discuss any questions you have with your health care provider.

## 2014-07-13 NOTE — ED Provider Notes (Signed)
Medical screening examination/treatment/procedure(s) were performed by a resident physician or non-physician practitioner and as the supervising physician I was immediately available for consultation/collaboration.  Linna Darner, MD Family Medicine   Waldemar Dickens, MD 07/13/14 [phone]

## 2014-07-25 ENCOUNTER — Encounter (HOSPITAL_COMMUNITY): Payer: Self-pay | Admitting: Emergency Medicine

## 2014-07-25 ENCOUNTER — Emergency Department (HOSPITAL_COMMUNITY)
Admission: EM | Admit: 2014-07-25 | Discharge: 2014-07-25 | Disposition: A | Discharge status: Home / Self Care | Payer: No Typology Code available for payment source | Source: Home / Self Care

## 2014-07-25 DIAGNOSIS — J302 Other seasonal allergic rhinitis: Secondary | ICD-10-CM

## 2014-07-25 DIAGNOSIS — J309 Allergic rhinitis, unspecified: Secondary | ICD-10-CM

## 2014-07-25 MED ORDER — TRIAMCINOLONE ACETONIDE 55 MCG/ACT NA AERO: 2.0000 | INHALATION_SPRAY | Freq: Every day | NASAL | Status: DC

## 2014-07-25 NOTE — ED Provider Notes (Signed)
CSN: [id number]     Arrival date & time 07/25/14  1046 History   None    Chief Complaint  Patient presents with  . URI   (Consider location/radiation/quality/duration/timing/severity/associated sxs/prior Treatment) Patient is a 43 y.o. female presenting with URI. The history is provided by the patient.  URI Presenting symptoms: congestion and rhinorrhea   Presenting symptoms: no fever   Severity:  Moderate Onset quality:  Gradual Duration:  1 week Progression:  Unchanged Chronicity:  Chronic Ineffective treatments:  OTC medications Associated symptoms: sneezing   Risk factors: no sick contacts   Risk factors comment:  Seasonal allerg   Past Medical History  Diagnosis Date  . Allergy     latex  . Abnormal Pap smear   . Seasonal allergies   . Shortness of breath     with exercise - smoker  . Migraines     otc meds prn  . Shoulder pain, right     otc meds  . Plantar fasciitis, bilateral    Past Surgical History  Procedure Laterality Date  . Bladder suspension  2009  . Tubal ligation  1999  . Svd      x 2  . Dilation and curettage of uterus      hx mab  . Diagnostic laparoscopy      ectopic pregnancy  . Vaginal hysterectomy  03/13/2012    Procedure: HYSTERECTOMY VAGINAL;  Surgeon: Mora Bellman, MD;  Location: [facility];  Service: Gynecology;  Laterality: N/A;  . Abdominal hysterectomy     Family History  Problem Relation Age of Onset  . Cancer Father     stomach  . Diabetes Maternal Grandmother    History  Substance Use Topics  . Smoking status: Current Every Day Smoker -- 1.00 packs/day for 29 years    Types: Cigarettes  . Smokeless tobacco: Never Used  . Alcohol Use: 1.0 oz/week    2 drink(s) per week     Comment: socially   OB History   Grav Para Term Preterm Abortions TAB SAB Ect Mult Living   4 2 2  2  1 1  2      Review of Systems  Constitutional: Negative.  Negative for fever.  HENT: Positive for congestion, postnasal drip, rhinorrhea and  sneezing.   Skin: Negative.     Allergies  Latex  Home Medications   Prior to Admission medications   Medication Sig Start Date End Date Taking? Authorizing Provider  aspirin-acetaminophen-caffeine (EXCEDRIN MIGRAINE) [phone] MG per tablet Take 1 tablet by mouth every 6 (six) hours as needed for pain.    Historical Provider, MD  cyclobenzaprine (FLEXERIL) 10 MG tablet Take 1 tablet (10 mg total) by mouth 2 (two) times daily as needed for muscle spasms. 10/21/12   Kandis Nab, MD  fluconazole (DIFLUCAN) 150 MG tablet Take 1 tablet (150 mg total) by mouth once. 01/16/14   Kandis Nab, MD  loratadine (CLARITIN) 10 MG tablet Take 10 mg by mouth daily.    Historical Provider, MD  Multiple Vitamin (MULTIVITAMIN) tablet Take 1 tablet by mouth daily.    Historical Provider, MD  pantoprazole (PROTONIX) 40 MG tablet Take 1 tablet (40 mg total) by mouth daily. 07/13/14   Janne Napoleon, NP  ranitidine (ZANTAC) 75 MG tablet Take 75 mg by mouth 2 (two) times daily.    Historical Provider, MD  triamcinolone (NASACORT) 55 MCG/ACT AERO nasal inhaler Place 2 sprays into the nose daily. 07/25/14   Billy Fischer, MD  BP 139/86  Pulse 71  Temp(Src) 98.1 F (36.7 C) (Oral)  Resp 16  SpO2 97%  LMP 03/02/2012 Physical Exam  Nursing note and vitals reviewed. Constitutional: She is oriented to person, place, and time. She appears well-developed and well-nourished.  HENT:  Right Ear: External ear normal.  Left Ear: External ear normal.  Mouth/Throat: Oropharynx is clear and moist.  Neck: Normal range of motion. Neck supple.  Cardiovascular: Normal heart sounds.   Pulmonary/Chest: Effort normal and breath sounds normal.  Lymphadenopathy:    She has no cervical adenopathy.  Neurological: She is alert and oriented to person, place, and time.  Skin: Skin is warm and dry.    ED Course  Procedures (including critical care time) Labs Review Labs Reviewed - No data to display  Imaging Review No  results found.   MDM   1. Seasonal allergic rhinitis        Billy Fischer, MD 07/25/14 [phone]

## 2014-07-25 NOTE — ED Notes (Signed)
Pt  Has  Symptoms  Of  Headache  /  Dizzy   With        Sinus  Drainage  /  Congestion  With    Symptoms  For  About  1  Week        Pt  Reports  Symptoms  Not  releived  By OTC  meds            Sitting  Upright on  The  Exam table  Speaking in  Complete  sentances

## 2014-07-29 ENCOUNTER — Ambulatory Visit (INDEPENDENT_AMBULATORY_CARE_PROVIDER_SITE_OTHER): Payer: No Typology Code available for payment source | Admitting: Family Medicine

## 2014-07-29 ENCOUNTER — Encounter: Payer: Self-pay | Admitting: Family Medicine

## 2014-07-29 VITALS — BP 132/85 | HR 81 | Temp 97.9°F | Ht 62.0 in | Wt 219.9 lb

## 2014-07-29 DIAGNOSIS — R197 Diarrhea, unspecified: Secondary | ICD-10-CM

## 2014-07-29 DIAGNOSIS — H6692 Otitis media, unspecified, left ear: Secondary | ICD-10-CM | POA: Insufficient documentation

## 2014-07-29 DIAGNOSIS — R198 Other specified symptoms and signs involving the digestive system and abdomen: Secondary | ICD-10-CM

## 2014-07-29 DIAGNOSIS — R195 Other fecal abnormalities: Secondary | ICD-10-CM

## 2014-07-29 DIAGNOSIS — R1084 Generalized abdominal pain: Secondary | ICD-10-CM

## 2014-07-29 DIAGNOSIS — G8929 Other chronic pain: Secondary | ICD-10-CM

## 2014-07-29 DIAGNOSIS — K319 Disease of stomach and duodenum, unspecified: Secondary | ICD-10-CM

## 2014-07-29 DIAGNOSIS — H60399 Other infective otitis externa, unspecified ear: Secondary | ICD-10-CM

## 2014-07-29 DIAGNOSIS — K219 Gastro-esophageal reflux disease without esophagitis: Secondary | ICD-10-CM

## 2014-07-29 DIAGNOSIS — K30 Functional dyspepsia: Secondary | ICD-10-CM | POA: Insufficient documentation

## 2014-07-29 DIAGNOSIS — H60502 Unspecified acute noninfective otitis externa, left ear: Secondary | ICD-10-CM

## 2014-07-29 MED ORDER — AZITHROMYCIN 250 MG PO TABS: ORAL_TABLET | ORAL | Status: DC

## 2014-07-29 MED ORDER — FLUCONAZOLE 150 MG PO TABS: 150.0000 mg | ORAL_TABLET | Freq: Once | ORAL | Status: DC

## 2014-07-29 MED ORDER — PANTOPRAZOLE SODIUM 40 MG PO TBEC: 40.0000 mg | DELAYED_RELEASE_TABLET | Freq: Every day | ORAL | Status: DC

## 2014-07-29 NOTE — Patient Instructions (Addendum)
Thank you for coming in, today!  I want you to continue your Protonix (pantoprazole) medicine that the urgent care started. I want to check your bowel movements for several things. The lab people will tell you how to collect a stool sample for [place]. It may take a while for all the labs to come back. Depending on what everything shows, we may end up referring you to the stomach doctors.  For your ears / dizziness, take azithromycin for 5 days. The first day, take two pills. After that, take one pill a day for 4 days. Keep taking your loratadine and the Flonase. You can take both at once. You can also take over-the-counter medicines for congestion.  Come back to see me as needed. I will call you or send you a letter with your results. If you have not heard from me in a while, you can call with questions at any time.  Please feel free to call with any questions or concerns at any time, at [phone]. --Dr. Venetia Maxon

## 2014-07-29 NOTE — Assessment & Plan Note (Signed)
Likely related to possible IBS. See separate problem list notes. F/u ordered KUB and stool studies. Possibly for GI referral in the future.

## 2014-07-29 NOTE — Progress Notes (Signed)
   Subjective:    Patient ID: Deanna Scott, female    DOB: 1971/04/24, 43 y.o.   MRN: [id number]  HPI: Pt presents to clinic for follow-up from urgent care about two weeks ago with complaint of heartburn and belly pain. She also has complaint of dizziness and congestion and nasal drainage.  Heartburn / abdominal pain / bowel movement problems - pt was seen at urgent care about two weeks ago and started  - pain in her abdomen is off and on, perhaps a few times per month - she has some complaint of straining to have BM's but no pain - overall these symptoms have been present for 6-7 months or longer - symptoms are worse with spicy or acidic foods - states her diet does not have much fiber in it - BM's occur 2-3 times per day and are often loose - she has had blood in her stool before, but thinks it could be related to "warts" on her bottom (hx condyloma acuminatum) - she was started on Protonix, which has helped with heartburn but not gas / BM problems  Dizziness / congestion - present for >1 week; seen at Urgent Care and diagnosed with allergies - Flonase nasal spray has helped some with congestion but not drainage - she normally takes loratadine but since she has been on Flonase - dizziness is on-and-off, worse with movement - she also has headaches with teeth pain (pressure, on-and-off), but no fevers, vomiting  Review of Systems: As above     Objective:   Physical Exam BP 132/85  Pulse 81  Temp(Src) 97.9 F (36.6 C) (Oral)  Ht 5\' 2"  (1.575 m)  Wt 219 lb 14.4 oz (99.746 kg)  BMI 40.21 kg/m2  LMP 03/02/2012 Gen: well-appearing adult female in NAD HEENT: [place]/AT, EOMI, PERRLA, EOMI  Posterior oropharynx and nasal mucosae mildly red / edematous  Left TM red but not bulging, no drainage or perforation Cardio: RRR, no murmur appreciated Pulm: CTAB, no wheezes, normal WOB Abd: obese, but soft, nontender, BS+ Ext: warm, well-perfused Neuro: no gross focal deficit; gait normal,  balance normal, able to change positions and ambulate unassisted     Assessment & Plan:

## 2014-07-29 NOTE — Assessment & Plan Note (Signed)
Recently diagnosed at Urgent Care with complaint of heartburn, now doing better with PPI. Continue Protonix (Rx given today) and f/u as needed; see also separate problem list notes re: abdominal complaints.

## 2014-07-29 NOTE — Assessment & Plan Note (Signed)
A: Uncertain exact etiology. Suspect irritable bowel syndrome given chronicity and description of symptoms. GERD also contributing, though definite improvement with PPI recently. Hx of ?GI bleeding raises concern for inflammatory or frank GI pathologic process. Also possible for chronic constipation with overflow diarrhea, given description of "straining" but having loose stools.  P: Discussed with Dr. Mingo Amber. Will plan to get KUB and several stool studies (culture, pathogen PCR, lactoferrin, occult blood, O&P, and H.pylori). Depending on results, may refer to GI, though pt has the orange card, so referral may need to be at [facility] or [facility] and may take longer. Will await results; if KUB reveals large stool burden, will recommend MiraLAX / Colace / enema / etc OTC, to see if that helps. Continue PPI and f/u as needed, otherwise.

## 2014-07-29 NOTE — Assessment & Plan Note (Addendum)
A: Left TM inflamed, and complaints of dizziness, tooth pain, etc, all consistent with URI / otitis. Possibly viral, though with symptoms for 2+ weeks, though no fever.  P: Rx for azithromycin for 5 days. Continue Flonase, resume PO antihistamine (pt had been avoiding using both due to concern about taking both at the same time). Rx for Diflucan in case of yeast infection while on abx, per pt request. F/u PRN.

## 2014-07-29 NOTE — Assessment & Plan Note (Signed)
Likely related to GERD and possible IBS. See separate problem list notes. F/u ordered KUB and stool studies. Possibly for GI referral in the future.

## 2014-07-30 ENCOUNTER — Telehealth: Payer: Self-pay | Admitting: Family Medicine

## 2014-07-30 ENCOUNTER — Other Ambulatory Visit: Payer: Self-pay | Admitting: Family Medicine

## 2014-07-30 ENCOUNTER — Ambulatory Visit (HOSPITAL_COMMUNITY)
Admission: RE | Admit: 2014-07-30 | Discharge: 2014-07-30 | Disposition: A | Discharge status: Home / Self Care | Payer: No Typology Code available for payment source | Source: Ambulatory Visit | Attending: Family Medicine | Admitting: Family Medicine

## 2014-07-30 DIAGNOSIS — R195 Other fecal abnormalities: Secondary | ICD-10-CM

## 2014-07-30 DIAGNOSIS — G8929 Other chronic pain: Secondary | ICD-10-CM | POA: Insufficient documentation

## 2014-07-30 DIAGNOSIS — R197 Diarrhea, unspecified: Secondary | ICD-10-CM | POA: Insufficient documentation

## 2014-07-30 DIAGNOSIS — R1084 Generalized abdominal pain: Secondary | ICD-10-CM | POA: Insufficient documentation

## 2014-07-30 NOTE — Addendum Note (Signed)
Addended by: Lianne Bushy on: 07/30/2014 10:58 AM   Modules accepted: Orders

## 2014-07-30 NOTE — Telephone Encounter (Signed)
Called pt to let her know that her KUB was normal. Will await stool studies to decide on treatment course for chronic abdominal pain, functional GI symptoms, etc (see office note 9/23). Pt voiced understanding and thanks. --CMS

## 2014-07-31 LAB — OVA AND PARASITE EXAMINATION: OP: NONE SEEN

## 2014-07-31 LAB — FECAL LACTOFERRIN, QUANT: Lactoferrin: NEGATIVE

## 2014-08-01 LAB — HELICOBACTER PYLORI  SPECIAL ANTIGEN: H. PYLORI ANTIGEN STOOL: NEGATIVE

## 2014-08-03 ENCOUNTER — Other Ambulatory Visit: Payer: Self-pay | Admitting: Family Medicine

## 2014-08-03 DIAGNOSIS — R198 Other specified symptoms and signs involving the digestive system and abdomen: Secondary | ICD-10-CM

## 2014-08-03 DIAGNOSIS — G8929 Other chronic pain: Secondary | ICD-10-CM

## 2014-08-03 DIAGNOSIS — R195 Other fecal abnormalities: Secondary | ICD-10-CM

## 2014-08-03 DIAGNOSIS — K921 Melena: Secondary | ICD-10-CM

## 2014-08-03 DIAGNOSIS — K30 Functional dyspepsia: Secondary | ICD-10-CM

## 2014-08-03 DIAGNOSIS — R1084 Generalized abdominal pain: Principal | ICD-10-CM

## 2014-08-03 LAB — STOOL CULTURE

## 2014-08-03 NOTE — Progress Notes (Signed)
Pt brought daughter in for clinic visit today; discussed pt's stool study results at end of daughter's visit. Generally negative findings, but pt does report hematochezia in the past several days. Previously discussed with Dr. Mingo Amber; will go ahead and place GI referral today. Pt voiced understanding.

## 2014-09-07 ENCOUNTER — Emergency Department (HOSPITAL_COMMUNITY)
Admission: EM | Admit: 2014-09-07 | Discharge: 2014-09-07 | Disposition: A | Discharge status: Home / Self Care | Payer: No Typology Code available for payment source | Source: Home / Self Care | Attending: Family Medicine | Admitting: Family Medicine

## 2014-09-07 ENCOUNTER — Encounter (HOSPITAL_COMMUNITY): Payer: Self-pay | Admitting: Emergency Medicine

## 2014-09-07 ENCOUNTER — Other Ambulatory Visit (HOSPITAL_COMMUNITY)
Admission: RE | Admit: 2014-09-07 | Discharge: 2014-09-07 | Disposition: A | Discharge status: Home / Self Care | Payer: No Typology Code available for payment source | Source: Ambulatory Visit | Attending: Family Medicine | Admitting: Family Medicine

## 2014-09-07 DIAGNOSIS — N760 Acute vaginitis: Secondary | ICD-10-CM | POA: Insufficient documentation

## 2014-09-07 DIAGNOSIS — Z113 Encounter for screening for infections with a predominantly sexual mode of transmission: Secondary | ICD-10-CM | POA: Insufficient documentation

## 2014-09-07 LAB — POCT URINALYSIS DIP (DEVICE)
Bilirubin Urine: NEGATIVE
Glucose, UA: NEGATIVE mg/dL
KETONES UR: NEGATIVE mg/dL
LEUKOCYTES UA: NEGATIVE
Nitrite: NEGATIVE
PH: 5.5 (ref 5.0–8.0)
Protein, ur: NEGATIVE mg/dL
SPECIFIC GRAVITY, URINE: 1.02 (ref 1.005–1.030)
Urobilinogen, UA: 0.2 mg/dL (ref 0.0–1.0)

## 2014-09-07 MED ORDER — FLUCONAZOLE 150 MG PO TABS: ORAL_TABLET | ORAL | Status: DC

## 2014-09-07 MED ORDER — METRONIDAZOLE 500 MG PO TABS: 500.0000 mg | ORAL_TABLET | Freq: Two times a day (BID) | ORAL | Status: DC

## 2014-09-07 NOTE — ED Notes (Addendum)
Patient c/o urinary frequency with pain and a Deanna Scott foul smelling discharge x 4 days. Patient reports she had a full hysterectomy  2 years ago and is not sexually active so she is unsure what the discharge is coming from. NAD. Denies fever.

## 2014-09-07 NOTE — ED Provider Notes (Signed)
CSN: [id number]     Arrival date & time 09/07/14  1746 History   First MD Initiated Contact with Patient 09/07/14 1758     Chief Complaint  Patient presents with  . Urinary Tract Infection  . Vaginal Discharge   (Consider location/radiation/quality/duration/timing/severity/associated sxs/prior Treatment) HPI Comments: Patient describes 3-4 days of vaginal irritation, external burning and yellow brown discharge. Is s/p remote hysterectomy Is not sexually active Reports herself to be otherwise healthy PCP: [facility]   Patient is a 43 y.o. female presenting with vaginal discharge. The history is provided by the patient.  Vaginal Discharge   Past Medical History  Diagnosis Date  . Allergy     latex  . Abnormal Pap smear   . Seasonal allergies   . Shortness of breath     with exercise - smoker  . Migraines     otc meds prn  . Shoulder pain, right     otc meds  . Plantar fasciitis, bilateral    Past Surgical History  Procedure Laterality Date  . Bladder suspension  2009  . Tubal ligation  1999  . Svd      x 2  . Dilation and curettage of uterus      hx mab  . Diagnostic laparoscopy      ectopic pregnancy  . Vaginal hysterectomy  03/13/2012    Procedure: HYSTERECTOMY VAGINAL;  Surgeon: Mora Bellman, MD;  Location: [facility];  Service: Gynecology;  Laterality: N/A;  . Abdominal hysterectomy     Family History  Problem Relation Age of Onset  . Cancer Father     stomach  . Diabetes Maternal Grandmother    History  Substance Use Topics  . Smoking status: Current Every Day Smoker -- 1.00 packs/day for 29 years    Types: Cigarettes  . Smokeless tobacco: Never Used  . Alcohol Use: 1.0 oz/week    2 drink(s) per week     Comment: socially   OB History    Gravida Para Term Preterm AB TAB SAB Ectopic Multiple Living   4 2 2  2  1 1  2      Review of Systems  Genitourinary: Positive for vaginal discharge.  All other systems reviewed and are negative.   Allergies   Latex  Home Medications   Prior to Admission medications   Medication Sig Start Date End Date Taking? Authorizing Provider  aspirin-acetaminophen-caffeine (EXCEDRIN MIGRAINE) [phone] MG per tablet Take 1 tablet by mouth every 6 (six) hours as needed for pain.    Historical Provider, MD  azithromycin (ZITHROMAX) 250 MG tablet Take 2 pills the first day, then 1 pill a day for four more days. 07/29/14   [place], MD  cyclobenzaprine (FLEXERIL) 10 MG tablet Take 1 tablet (10 mg total) by mouth 2 (two) times daily as needed for muscle spasms. 10/21/12   Kandis Nab, MD  fluconazole (DIFLUCAN) 150 MG tablet Take one tab by mouth once 09/07/14   Lutricia Feil, PA  loratadine (CLARITIN) 10 MG tablet Take 10 mg by mouth daily.    Historical Provider, MD  metroNIDAZOLE (FLAGYL) 500 MG tablet Take 1 tablet (500 mg total) by mouth 2 (two) times daily. 09/07/14   Lutricia Feil, PA  Multiple Vitamin (MULTIVITAMIN) tablet Take 1 tablet by mouth daily.    Historical Provider, MD  pantoprazole (PROTONIX) 40 MG tablet Take 1 tablet (40 mg total) by mouth daily. 07/29/14   Sharon Mt Street, MD  ranitidine (  ZANTAC) 75 MG tablet Take 75 mg by mouth 2 (two) times daily.    Historical Provider, MD  triamcinolone (NASACORT) 55 MCG/ACT AERO nasal inhaler Place 2 sprays into the nose daily. 07/25/14   Billy Fischer, MD   BP 143/93 mmHg  Pulse 77  Temp(Src) 97.7 F (36.5 C) (Oral)  Resp 12  SpO2 100%  LMP 03/02/2012 Physical Exam  Constitutional: She is oriented to person, place, and time. She appears well-developed and well-nourished.  HENT:  Head: Normocephalic and atraumatic.  Eyes: Conjunctivae are normal. No scleral icterus.  Cardiovascular: Normal rate.   Pulmonary/Chest: Effort normal.  Abdominal: Soft. Bowel sounds are normal. She exhibits no distension. There is no tenderness.  Genitourinary:    Pelvic exam was performed with patient supine. There is rash and  tenderness on the right labia. There is no lesion on the right labia. There is rash and tenderness on the left labia. There is no lesion on the left labia. No erythema, tenderness or bleeding in the vagina. No foreign body around the vagina. No signs of injury around the vagina. Vaginal discharge found.  Scant yellow vaginal discharge  Musculoskeletal: Normal range of motion.  Neurological: She is alert and oriented to person, place, and time.  Skin: Skin is warm and dry.  Psychiatric: She has a normal mood and affect. Her behavior is normal.  Nursing note and vitals reviewed.   ED Course  Procedures (including critical care time) Labs Review Labs Reviewed  POCT URINALYSIS DIP (DEVICE) - Abnormal; Notable for the following:    Hgb urine dipstick TRACE (*)    All other components within normal limits  CERVICOVAGINAL ANCILLARY ONLY    Imaging Review No results found.   MDM   1. Vaginitis   Exam suggests external yeast vaginitis and possible bacterial vaginosis Will treat with metronidazole x 7 days and single dose diflucan and advise PCP follow up if no improvement. Vaginal swabs sent for testing. Will advise if results indicate need for additional treatment.     Lutricia Feil, [place] 09/07/14 1904

## 2014-09-07 NOTE — Discharge Instructions (Signed)

## 2014-09-08 LAB — CERVICOVAGINAL ANCILLARY ONLY
Chlamydia: NEGATIVE
Neisseria Gonorrhea: NEGATIVE

## 2014-09-09 LAB — CERVICOVAGINAL ANCILLARY ONLY
Wet Prep (BD Affirm): NEGATIVE
Wet Prep (BD Affirm): NEGATIVE
Wet Prep (BD Affirm): NEGATIVE

## 2014-10-28 ENCOUNTER — Ambulatory Visit (INDEPENDENT_AMBULATORY_CARE_PROVIDER_SITE_OTHER): Payer: Self-pay | Admitting: Family Medicine

## 2014-10-28 ENCOUNTER — Encounter: Payer: Self-pay | Admitting: Family Medicine

## 2014-10-28 VITALS — BP 135/97 | HR 106 | Temp 98.7°F | Ht 62.0 in | Wt 219.5 lb

## 2014-10-28 DIAGNOSIS — B9689 Other specified bacterial agents as the cause of diseases classified elsewhere: Secondary | ICD-10-CM | POA: Insufficient documentation

## 2014-10-28 DIAGNOSIS — B379 Candidiasis, unspecified: Secondary | ICD-10-CM

## 2014-10-28 DIAGNOSIS — J019 Acute sinusitis, unspecified: Secondary | ICD-10-CM | POA: Insufficient documentation

## 2014-10-28 MED ORDER — AMOXICILLIN 875 MG PO TABS: 875.0000 mg | ORAL_TABLET | Freq: Two times a day (BID) | ORAL | Status: DC

## 2014-10-28 MED ORDER — FLUCONAZOLE 150 MG PO TABS: 150.0000 mg | ORAL_TABLET | Freq: Once | ORAL | Status: DC

## 2014-10-28 NOTE — Patient Instructions (Signed)
You probably have a bacterial infection that was caused by the viral cold that you have.  - Take amoxicillin 875mg  twice per day for 10 days - Use nasal saline to wash out your nose  - Take 1 tbsp of honey as needed for cough/sore throat - Take plain old tylenol for the headaches and general pain.

## 2014-10-28 NOTE — Assessment & Plan Note (Signed)
With "double worsening" of viral cold in longtime smoker.  - Rx amoxicillin - Nasal saline - Nasal decongestants prn with warning about rebound - Adequate hydration

## 2014-10-28 NOTE — Progress Notes (Signed)
   Subjective:  Keiley Levey is a 43 y.o. female long-time smoker here for worsening cold symptoms.  She reports primary symptoms starting 7 days ago of runny nose, congestion which has progressed to a non-productive cough. Symptoms were improving until 2 days ago when she began having fevers and chills. She has also started to have decreased hearing  and headache.  - History of antibiotic-associated yeast infections. All other pertinent systems reviewed and are negative. Objective:  BP 135/97 mmHg  Pulse 106  Temp(Src) 98.7 F (37.1 C) (Oral)  Ht 5\' 2"  (1.575 m)  Wt 219 lb 8 oz (99.565 kg)  BMI 40.14 kg/m2  LMP 03/02/2012  Gen: alert, cooperative and no distress  Eyes: conjunctivae/corneas clear. PERRL, EOM's intact.  Ears: pearly grey retracted TM's and normal external ear canals bilaterally Nose: Septum midline. Mucosa normal. Maxillary sinus tenderness to percussion.  Throat: good dentition, lips, mucosa, and tongue normal; gums normal  Neck: no adenopathy, thyroid not enlarged, no tenderness Lungs: respirations non-labored, clear to auscultation bilaterally  Heart: regular rate and rhythm, S1, S2 normal, no murmur or gallop Assessment & Plan:  Nahia Nissan is a 43 y.o. female with "double worsening" of common cold suggestive of acute bacterial rhinosinusitis.

## 2014-11-02 ENCOUNTER — Telehealth: Payer: Self-pay | Admitting: Family Medicine

## 2014-11-02 DIAGNOSIS — J019 Acute sinusitis, unspecified: Principal | ICD-10-CM

## 2014-11-02 DIAGNOSIS — B9689 Other specified bacterial agents as the cause of diseases classified elsewhere: Secondary | ICD-10-CM

## 2014-11-02 MED ORDER — AZITHROMYCIN 250 MG PO TABS: ORAL_TABLET | ORAL | Status: DC

## 2014-11-02 NOTE — Telephone Encounter (Signed)
Spoke with patient and informed her of rx instructions and rx up front to pick up

## 2014-11-02 NOTE — Telephone Encounter (Signed)
Will forward to PCP and MD who prescribed.

## 2014-11-02 NOTE — Telephone Encounter (Signed)
Pt not tolerant of amoxicillin and should discontinue this. Rx azithromycin to complete 5 days and follow up if not improved.   Tucker Steedley B. Bonner Puna, MD, PGY-2 11/02/2014 9:20 AM

## 2014-11-02 NOTE — Telephone Encounter (Signed)
Pt called and was prescribed amoxicillin. SHe said that every time she takes this she gets dizzy and she itches. She wanted to know can she stop taking this. Please call to inform patient. jw

## 2015-01-14 ENCOUNTER — Encounter: Payer: Self-pay | Admitting: Family Medicine

## 2015-02-15 ENCOUNTER — Ambulatory Visit: Payer: Self-pay | Admitting: Family Medicine

## 2015-02-15 ENCOUNTER — Other Ambulatory Visit: Payer: Self-pay | Admitting: *Deleted

## 2015-02-15 MED ORDER — PANTOPRAZOLE SODIUM 40 MG PO TBEC: 40.0000 mg | DELAYED_RELEASE_TABLET | Freq: Every day | ORAL | Status: DC

## 2015-02-15 NOTE — Addendum Note (Signed)
Addended by: Emmaline Kluver on: 02/15/2015 09:19 AM   Modules accepted: Orders

## 2015-02-17 ENCOUNTER — Other Ambulatory Visit: Payer: Self-pay | Admitting: Family Medicine

## 2015-02-17 MED ORDER — PANTOPRAZOLE SODIUM 40 MG PO TBEC: 40.0000 mg | DELAYED_RELEASE_TABLET | Freq: Every day | ORAL | Status: DC

## 2015-02-17 NOTE — Telephone Encounter (Signed)
Pt is requesting refill on pantoprazole, goes to [facility]/[address]

## 2015-02-23 ENCOUNTER — Other Ambulatory Visit (HOSPITAL_COMMUNITY)
Admission: RE | Admit: 2015-02-23 | Discharge: 2015-02-23 | Disposition: A | Discharge status: Home / Self Care | Payer: 59 | Source: Ambulatory Visit | Attending: Family Medicine | Admitting: Family Medicine

## 2015-02-23 ENCOUNTER — Ambulatory Visit (INDEPENDENT_AMBULATORY_CARE_PROVIDER_SITE_OTHER): Payer: 59 | Admitting: Family Medicine

## 2015-02-23 ENCOUNTER — Encounter: Payer: Self-pay | Admitting: Family Medicine

## 2015-02-23 VITALS — BP 119/83 | HR 78 | Temp 98.0°F | Ht 62.0 in | Wt 222.0 lb

## 2015-02-23 DIAGNOSIS — N760 Acute vaginitis: Secondary | ICD-10-CM

## 2015-02-23 DIAGNOSIS — Z113 Encounter for screening for infections with a predominantly sexual mode of transmission: Secondary | ICD-10-CM | POA: Diagnosis not present

## 2015-02-23 LAB — POCT WET PREP (WET MOUNT): Clue Cells Wet Prep Whiff POC: NEGATIVE

## 2015-02-23 NOTE — Patient Instructions (Addendum)
The initial test today is negative I'll call you with the results of the rest of your tests Always use condoms to prevent infection.  Be well, Dr. Randell Loop Sex Safe sex is about reducing the risk of giving or getting a sexually transmitted disease (STD). STDs are spread through sexual contact involving the genitals, mouth, or rectum. Some STDs can be cured and others cannot. Safe sex can also prevent unintended pregnancies.  WHAT ARE SOME SAFE SEX PRACTICES?  Limit your sexual activity to only one partner who is having sex with only you.  Talk to your partner about his or her past partners, past STDs, and drug use.  Use a condom every time you have sexual intercourse. This includes vaginal, oral, and anal sexual activity. Both females and males should wear condoms during oral sex. Only use latex or polyurethane condoms and water-based lubricants. Using petroleum-based lubricants or oils to lubricate a condom will weaken the condom and increase the chance that it will break. The condom should be in place from the beginning to the end of sexual activity. Wearing a condom reduces, but does not completely eliminate, your risk of getting or giving an STD. STDs can be spread by contact with infected body fluids and skin.  Get vaccinated for hepatitis B and HPV.  Avoid alcohol and recreational drugs, which can affect your judgment. You may forget to use a condom or participate in high-risk sex.  For females, avoid douching after sexual intercourse. Douching can spread an infection farther into the reproductive tract.  Check your body for signs of sores, blisters, rashes, or unusual discharge. See your health care provider if you notice any of these signs.  Avoid sexual contact if you have symptoms of an infection or are being treated for an STD. If you or your partner has herpes, avoid sexual contact when blisters are present. Use condoms at all other times.  If you are at risk of being  infected with HIV, it is recommended that you take a prescription medicine daily to prevent HIV infection. This is called pre-exposure prophylaxis (PrEP). You are considered at risk if:  You are a man who has sex with other men (MSM).  You are a heterosexual man or woman who is sexually active with more than one partner.  You take drugs by injection.  You are sexually active with a partner who has HIV.  Talk with your health care provider about whether you are at high risk of being infected with HIV. If you choose to begin PrEP, you should first be tested for HIV. You should then be tested every 3 months for as long as you are taking PrEP.  See your health care provider for regular screenings, exams, and tests for other STDs. Before having sex with a new partner, each of you should be screened for STDs and should talk about the results with each other. WHAT ARE THE BENEFITS OF SAFE SEX?   There is less chance of getting or giving an STD.  You can prevent unwanted or unintended pregnancies.  By discussing safe sex concerns with your partner, you may increase feelings of intimacy, comfort, trust, and honesty between the two of you. Document Released: 11/30/2004 Document Revised: 03/09/2014 Document Reviewed: 04/15/2012 [facility] Patient Information 2015 [place], [place]. This information is not intended to replace advice given to you by your health care provider. Make sure you discuss any questions you have with your health care provider.

## 2015-02-24 LAB — CERVICOVAGINAL ANCILLARY ONLY
CHLAMYDIA, DNA PROBE: NEGATIVE
Neisseria Gonorrhea: NEGATIVE
TRICH (WINDOWPATH): POSITIVE — AB

## 2015-02-24 NOTE — Progress Notes (Signed)
  HPI:  Pt presents for a same day appointment to discuss STD testing.  Reports that about 3 weeks ago she was sexually active with her ex-boyfriend. He has apparently given her an STD in the past. She has had some mild discharge and mild cramping. She had a Diflucan already at home and took that. She noted some improvement after taking this. She is stooling and urinating normally. Eating and drinking well. No fevers. Other than mild cramping, no pelvic pain. They used a condom initially, but completed intercourse without one. She bled a little during intercourse, but states she had not had any sex in the last 3 years. She has a history of a hysterectomy.  ROS: See HPI  [place]: Allergic rhinitis, GERD, depression, anxiety, obesity  PHYSICAL EXAM: BP 119/83 mmHg  Pulse 78  Temp(Src) 98 F (36.7 C) (Oral)  Ht 5\' 2"  (1.575 m)  Wt 222 lb (100.699 kg)  BMI 40.59 kg/m2  LMP 03/02/2012 Gen: NAD, pleasant, cooperative HEENT: NCAT Abdomen: soft NTTP GU: normal appearing external genitalia without lesions. Vagina is moist with white discharge. Cervix normal in appearance. No cervical motion tenderness or tenderness on bimanual exam. No adnexal masses.   ASSESSMENT/PLAN:  1. STD testing: -Check GC/chlamydia/trichomoniasis today as well as wet prep -Instructed on importance of condoms to prevent STDs -Recommend she return in 2-3 months to have HIV and syphilis testing completed, sooner if she has any symptoms . I do not think viral PCR testing for HIV is indicated at this time given her very recent unprotected intercourse, and lack of symptoms suggestive of acute HIV.  FOLLOW UP: F/u in 2-3 mos for HIV/RPR  [place] J. Ardelia Mems, [place]

## 2015-02-26 ENCOUNTER — Telehealth: Payer: Self-pay | Admitting: Family Medicine

## 2015-02-26 MED ORDER — METRONIDAZOLE 500 MG PO TABS: 500.0000 mg | ORAL_TABLET | Freq: Three times a day (TID) | ORAL | Status: DC

## 2015-02-26 NOTE — Telephone Encounter (Signed)
Wants test results Can leave message on voice mail

## 2015-02-26 NOTE — Telephone Encounter (Signed)
I called patient and discussed results, informed that Dr. Venetia Maxon sent in medication. Advised to have partner treated as well, and to avoid alcohol during flagyl treatment.  Leeanne Rio, MD

## 2015-02-26 NOTE — Telephone Encounter (Signed)
Patient with (+) trich, unsure in MD has reviewed yet. Will forward to PCP.

## 2015-02-26 NOTE — Telephone Encounter (Signed)
Briefly reviewed chart; pt saw Dr. Ardelia Mems on 4/20, Trich on Pap smear. Will call in Flagyl 500 mg TID for 7 days. Please let pt know. She can f/u as needed, otherwise. --CMS

## 2015-03-01 ENCOUNTER — Telehealth: Payer: Self-pay | Admitting: Family Medicine

## 2015-03-01 NOTE — Telephone Encounter (Signed)
Pt called because she is taking Flagyl and this is making her dizzy, and she also feel bad. She wanted to know if there is something else that can be called in. Please call and let her know and if she doesn't answer leave her a message. jw

## 2015-03-01 NOTE — Telephone Encounter (Signed)
Called pt to discuss; unfortunately, Flagyl is the best single option for Trichomonas. She does not have severe nausea and feels some better after coming home and resting. Her main complaint is dizziness. Offered Rx for Phenergan to help with dizziness / nausea with caveat that it may make her sleepy, but she declines. Pt states she will complete course of Flagyl as best she can and will call / f/u as needed. --CMS

## 2015-04-07 ENCOUNTER — Ambulatory Visit: Payer: 59

## 2015-04-12 ENCOUNTER — Ambulatory Visit: Payer: 59

## 2015-04-15 ENCOUNTER — Ambulatory Visit: Payer: Self-pay

## 2015-09-08 ENCOUNTER — Emergency Department (HOSPITAL_COMMUNITY)
Admission: EM | Admit: 2015-09-08 | Discharge: 2015-09-08 | Disposition: A | Discharge status: Home / Self Care | Payer: 59 | Attending: Emergency Medicine | Admitting: Emergency Medicine

## 2015-09-08 ENCOUNTER — Encounter (HOSPITAL_COMMUNITY): Payer: Self-pay | Admitting: Emergency Medicine

## 2015-09-08 DIAGNOSIS — Y9289 Other specified places as the place of occurrence of the external cause: Secondary | ICD-10-CM | POA: Insufficient documentation

## 2015-09-08 DIAGNOSIS — Z8739 Personal history of other diseases of the musculoskeletal system and connective tissue: Secondary | ICD-10-CM | POA: Insufficient documentation

## 2015-09-08 DIAGNOSIS — W228XXA Striking against or struck by other objects, initial encounter: Secondary | ICD-10-CM | POA: Insufficient documentation

## 2015-09-08 DIAGNOSIS — Z79899 Other long term (current) drug therapy: Secondary | ICD-10-CM | POA: Insufficient documentation

## 2015-09-08 DIAGNOSIS — G43909 Migraine, unspecified, not intractable, without status migrainosus: Secondary | ICD-10-CM | POA: Insufficient documentation

## 2015-09-08 DIAGNOSIS — S060X9A Concussion with loss of consciousness of unspecified duration, initial encounter: Secondary | ICD-10-CM

## 2015-09-08 DIAGNOSIS — Y998 Other external cause status: Secondary | ICD-10-CM | POA: Insufficient documentation

## 2015-09-08 DIAGNOSIS — Z720 Tobacco use: Secondary | ICD-10-CM | POA: Insufficient documentation

## 2015-09-08 DIAGNOSIS — Y9389 Activity, other specified: Secondary | ICD-10-CM | POA: Insufficient documentation

## 2015-09-08 DIAGNOSIS — Z91040 Latex allergy status: Secondary | ICD-10-CM | POA: Insufficient documentation

## 2015-09-08 NOTE — ED Provider Notes (Signed)
CSN: [id number]     Arrival date & time 09/08/15  1002 History   First MD Initiated Contact with Patient 09/08/15 1024     Chief Complaint  Patient presents with  . Headache  . Nausea  . Concussion      HPI Patient presents to the emergency department complaining of "concussive symptoms".  She was sent to the emergency department by her primary care physician for possible CT imaging of her head.  Patient reports she struck the right side of her head 3 days ago on a car door.  She's had ongoing headache and intermittent dizziness since then.  She reports her headache has been improving over the past several days.  She denies neck pain.  She denies weakness of her arms or legs.  She is not on anticoagulants.  She denies vomiting but reports some nausea.  She denies loss of consciousness when she hit her head.  Past Medical History  Diagnosis Date  . Allergy     latex  . Abnormal Pap smear   . Seasonal allergies   . Shortness of breath     with exercise - smoker  . Migraines     otc meds prn  . Shoulder pain, right     otc meds  . Plantar fasciitis, bilateral    Past Surgical History  Procedure Laterality Date  . Bladder suspension  2009  . Tubal ligation  1999  . Svd      x 2  . Dilation and curettage of uterus      hx mab  . Diagnostic laparoscopy      ectopic pregnancy  . Vaginal hysterectomy  03/13/2012    Procedure: HYSTERECTOMY VAGINAL;  Surgeon: Mora Bellman, MD;  Location: [facility];  Service: Gynecology;  Laterality: N/A;  . Abdominal hysterectomy     Family History  Problem Relation Age of Onset  . Cancer Father     stomach  . Diabetes Maternal Grandmother    Social History  Substance Use Topics  . Smoking status: Current Every Day Smoker -- 1.00 packs/day for 29 years    Types: Cigarettes  . Smokeless tobacco: Never Used  . Alcohol Use: 1.0 oz/week    2 drink(s) per week     Comment: socially   OB History    Gravida Para Term Preterm AB TAB SAB Ectopic  Multiple Living   4 2 2  2  1 1  2      Review of Systems  All other systems reviewed and are negative.     Allergies  Latex  Home Medications   Prior to Admission medications   Medication Sig Start Date End Date Taking? Authorizing Provider  aspirin-acetaminophen-caffeine (EXCEDRIN MIGRAINE) [phone] MG per tablet Take 1 tablet by mouth every 6 (six) hours as needed for pain.   Yes Historical Provider, MD  B Complex-C (B-COMPLEX WITH VITAMIN C) tablet Take 1 tablet by mouth daily.   Yes Historical Provider, MD  loratadine (CLARITIN) 10 MG tablet Take 10 mg by mouth daily.   Yes Historical Provider, MD  Omega-3 Fatty Acids (OMEGA 3 PO) Take 1 capsule by mouth daily.   Yes Historical Provider, MD  pantoprazole (PROTONIX) 40 MG tablet Take 1 tablet (40 mg total) by mouth daily. 02/17/15  Yes Sharon Mt Street, MD  pseudoephedrine (SUDAFED) 30 MG tablet Take 30 mg by mouth every 4 (four) hours as needed for congestion.   Yes Historical Provider, MD   BP 150/96 mmHg  Pulse 68  Temp(Src) 98.7 F (37.1 C) (Oral)  Resp 18  SpO2 98%  LMP 03/02/2012 Physical Exam  Constitutional: She is oriented to person, place, and time. She appears well-developed and well-nourished. No distress.  HENT:  Head: Normocephalic and atraumatic.  Eyes: EOM are normal. Pupils are equal, round, and reactive to light.  Neck: Normal range of motion.  Cardiovascular: Normal rate, regular rhythm and normal heart sounds.   Pulmonary/Chest: Effort normal and breath sounds normal.  Abdominal: Soft. She exhibits no distension. There is no tenderness.  Musculoskeletal: Normal range of motion.  Neurological: She is alert and oriented to person, place, and time.  5/5 strength in major muscle groups of  bilateral upper and lower extremities. Speech normal. No facial asymetry.   Skin: Skin is warm and dry.  Psychiatric: She has a normal mood and affect. Judgment normal.  Nursing note and vitals reviewed.   ED  Course  Procedures (including critical care time) Labs Review Labs Reviewed - No data to display  Imaging Review No results found. I have personally reviewed and evaluated these images and lab results as part of my medical decision-making.   EKG Interpretation None      MDM   Final diagnoses:  Concussion, with loss of consciousness of unspecified duration, initial encounter    Patient several days out from a closed head injury with concussive symptoms.  She is not on anticoagulants.  She does not need CT imaging of her head at this time.  Doubt skull fracture.  Primary care follow-up.  She understands to return to the ER for new or worsening symptoms    Jola Schmidt, MD 09/08/15 1126

## 2015-09-08 NOTE — Discharge Instructions (Signed)
Concussion, Adult  A concussion, or closed-head injury, is a brain injury caused by a direct blow to the head or by a quick and sudden movement (jolt) of the head or neck. Concussions are usually not life-threatening. Even so, the effects of a concussion can be serious. If you have had a concussion before, you are more likely to experience concussion-like symptoms after a direct blow to the head.   CAUSES  · Direct blow to the head, such as from running into another player during a soccer game, being hit in a fight, or hitting your head on a hard surface.  · A jolt of the head or neck that causes the brain to move back and forth inside the skull, such as in a car crash.  SIGNS AND SYMPTOMS  The signs of a concussion can be hard to notice. Early on, they may be missed by you, family members, and health care providers. You may look fine but act or feel differently.  Symptoms are usually temporary, but they may last for days, weeks, or even longer. Some symptoms may appear right away while others may not show up for hours or days. Every head injury is different. Symptoms include:  · Mild to moderate headaches that will not go away.  · A feeling of pressure inside your head.  · Having more trouble than usual:    Learning or remembering things you have heard.    Answering questions.    Paying attention or concentrating.    Organizing daily tasks.    Making decisions and solving problems.  · Slowness in thinking, acting or reacting, speaking, or reading.  · Getting lost or being easily confused.  · Feeling tired all the time or lacking energy (fatigued).  · Feeling drowsy.  · Sleep disturbances.    Sleeping more than usual.    Sleeping less than usual.    Trouble falling asleep.    Trouble sleeping (insomnia).  · Loss of balance or feeling lightheaded or dizzy.  · Nausea or vomiting.  · Numbness or tingling.  · Increased sensitivity to:    Sounds.    Lights.    Distractions.  · Vision problems or eyes that tire  easily.  · Diminished sense of taste or smell.  · Ringing in the ears.  · Mood changes such as feeling sad or anxious.  · Becoming easily irritated or angry for little or no reason.  · Lack of motivation.  · Seeing or hearing things other people do not see or hear (hallucinations).  DIAGNOSIS  Your health care provider can usually diagnose a concussion based on a description of your injury and symptoms. He or she will ask whether you passed out (lost consciousness) and whether you are having trouble remembering events that happened right before and during your injury.  Your evaluation might include:  · A brain scan to look for signs of injury to the brain. Even if the test shows no injury, you may still have a concussion.  · Blood tests to be sure other problems are not present.  TREATMENT  · Concussions are usually treated in an emergency department, in urgent care, or at a clinic. You may need to stay in the hospital overnight for further treatment.  · Tell your health care provider if you are taking any medicines, including prescription medicines, over-the-counter medicines, and natural remedies. Some medicines, such as blood thinners (anticoagulants) and aspirin, may increase the chance of complications. Also tell your health care   provider whether you have had alcohol or are taking illegal drugs. This information may affect treatment.  · Your health care provider will send you home with important instructions to follow.  · How fast you will recover from a concussion depends on many factors. These factors include how severe your concussion is, what part of your brain was injured, your age, and how healthy you were before the concussion.  · Most people with mild injuries recover fully. Recovery can take time. In general, recovery is slower in older persons. Also, persons who have had a concussion in the past or have other medical problems may find that it takes longer to recover from their current injury.  HOME  CARE INSTRUCTIONS  General Instructions  · Carefully follow the directions your health care provider gave you.  · Only take over-the-counter or prescription medicines for pain, discomfort, or fever as directed by your health care provider.  · Take only those medicines that your health care provider has approved.  · Do not drink alcohol until your health care provider says you are well enough to do so. Alcohol and certain other drugs may slow your recovery and can put you at risk of further injury.  · If it is harder than usual to remember things, write them down.  · If you are easily distracted, try to do one thing at a time. For example, do not try to watch TV while fixing dinner.  · Talk with family members or close friends when making important decisions.  · Keep all follow-up appointments. Repeated evaluation of your symptoms is recommended for your recovery.  · Watch your symptoms and tell others to do the same. Complications sometimes occur after a concussion. Older adults with a brain injury may have a higher risk of serious complications, such as a blood clot on the brain.  · Tell your teachers, school nurse, school counselor, coach, athletic trainer, or work manager about your injury, symptoms, and restrictions. Tell them about what you can or cannot do. They should watch for:    Increased problems with attention or concentration.    Increased difficulty remembering or learning new information.    Increased time needed to complete tasks or assignments.    Increased irritability or decreased ability to cope with stress.    Increased symptoms.  · Rest. Rest helps the brain to heal. Make sure you:    Get plenty of sleep at night. Avoid staying up late at night.    Keep the same bedtime hours on weekends and weekdays.    Rest during the day. Take daytime naps or rest breaks when you feel tired.  · Limit activities that require a lot of thought or concentration. These include:    Doing homework or job-related  work.    Watching TV.    Working on the computer.  · Avoid any situation where there is potential for another head injury (football, hockey, soccer, basketball, martial arts, downhill snow sports and horseback riding). Your condition will get worse every time you experience a concussion. You should avoid these activities until you are evaluated by the appropriate follow-up health care providers.  Returning To Your Regular Activities  You will need to return to your normal activities slowly, not all at once. You must give your body and brain enough time for recovery.  · Do not return to sports or other athletic activities until your health care provider tells you it is safe to do so.  · Ask   your health care provider when you can drive, ride a bicycle, or operate heavy machinery. Your ability to react may be slower after a brain injury. Never do these activities if you are dizzy.  · Ask your health care provider about when you can return to work or school.  Preventing Another Concussion  It is very important to avoid another brain injury, especially before you have recovered. In rare cases, another injury can lead to permanent brain damage, brain swelling, or death. The risk of this is greatest during the first 7-10 days after a head injury. Avoid injuries by:  · Wearing a seat belt when riding in a car.  · Drinking alcohol only in moderation.  · Wearing a helmet when biking, skiing, skateboarding, skating, or doing similar activities.  · Avoiding activities that could lead to a second concussion, such as contact or recreational sports, until your health care provider says it is okay.  · Taking safety measures in your home.    Remove clutter and tripping hazards from floors and stairways.    Use grab bars in bathrooms and handrails by stairs.    Place non-slip mats on floors and in bathtubs.    Improve lighting in dim areas.  SEEK MEDICAL CARE IF:  · You have increased problems paying attention or  concentrating.  · You have increased difficulty remembering or learning new information.  · You need more time to complete tasks or assignments than before.  · You have increased irritability or decreased ability to cope with stress.  · You have more symptoms than before.  Seek medical care if you have any of the following symptoms for more than 2 weeks after your injury:  · Lasting (chronic) headaches.  · Dizziness or balance problems.  · Nausea.  · Vision problems.  · Increased sensitivity to noise or light.  · Depression or mood swings.  · Anxiety or irritability.  · Memory problems.  · Difficulty concentrating or paying attention.  · Sleep problems.  · Feeling tired all the time.  SEEK IMMEDIATE MEDICAL CARE IF:  · You have severe or worsening headaches. These may be a sign of a blood clot in the brain.  · You have weakness (even if only in one hand, leg, or part of the face).  · You have numbness.  · You have decreased coordination.  · You vomit repeatedly.  · You have increased sleepiness.  · One pupil is larger than the other.  · You have convulsions.  · You have slurred speech.  · You have increased confusion. This may be a sign of a blood clot in the brain.  · You have increased restlessness, agitation, or irritability.  · You are unable to recognize people or places.  · You have neck pain.  · It is difficult to wake you up.  · You have unusual behavior changes.  · You lose consciousness.  MAKE SURE YOU:  · Understand these instructions.  · Will watch your condition.  · Will get help right away if you are not doing well or get worse.     This information is not intended to replace advice given to you by your health care provider. Make sure you discuss any questions you have with your health care provider.     Document Released: 01/13/2004 Document Revised: 11/13/2014 Document Reviewed: 05/15/2013  Elsevier Interactive Patient Education ©2016 [employer]

## 2015-09-08 NOTE — ED Notes (Signed)
Pt states that on Saturday she hit her head on car door. Pt has had headache, dizziness, nausea ever since. Pt states that her PCP sent her here for concussion.  Pt denies LOC when she hit her head.

## 2015-10-29 ENCOUNTER — Emergency Department (INDEPENDENT_AMBULATORY_CARE_PROVIDER_SITE_OTHER)
Admission: EM | Admit: 2015-10-29 | Discharge: 2015-10-29 | Disposition: A | Discharge status: Home / Self Care | Payer: No Typology Code available for payment source | Source: Home / Self Care

## 2015-10-29 ENCOUNTER — Encounter (HOSPITAL_COMMUNITY): Payer: Self-pay | Admitting: Emergency Medicine

## 2015-10-29 DIAGNOSIS — H8110 Benign paroxysmal vertigo, unspecified ear: Secondary | ICD-10-CM

## 2015-10-29 LAB — GLUCOSE, CAPILLARY: GLUCOSE-CAPILLARY: 88 mg/dL (ref 65–99)

## 2015-10-29 MED ORDER — MECLIZINE HCL 12.5 MG PO TABS: ORAL_TABLET | ORAL | Status: DC

## 2015-10-29 NOTE — ED Provider Notes (Signed)
CSN: [id number]     Arrival date & time 10/29/15  1501 History   None    Chief Complaint  Patient presents with  . Dizziness   (Consider location/radiation/quality/duration/timing/severity/associated sxs/prior Treatment) HPI History obtained from patient:   LOCATION: head SEVERITY: DURATION:this morning CONTEXT:sudden onset QUALITY: MODIFYING FACTORS:sudafed ASSOCIATED SYMPTOMS:nausea TIMING:constant OCCUPATION: cashier Concussion 1 month ago, hit head on car door. No CT done. Past Medical History  Diagnosis Date  . Allergy     latex  . Abnormal Pap smear   . Seasonal allergies   . Shortness of breath     with exercise - smoker  . Migraines     otc meds prn  . Shoulder pain, right     otc meds  . Plantar fasciitis, bilateral    Past Surgical History  Procedure Laterality Date  . Bladder suspension  2009  . Tubal ligation  1999  . Svd      x 2  . Dilation and curettage of uterus      hx mab  . Diagnostic laparoscopy      ectopic pregnancy  . Vaginal hysterectomy  03/13/2012    Procedure: HYSTERECTOMY VAGINAL;  Surgeon: Mora Bellman, MD;  Location: [facility];  Service: Gynecology;  Laterality: N/A;  . Abdominal hysterectomy     Family History  Problem Relation Age of Onset  . Cancer Father     stomach  . Diabetes Maternal Grandmother    Social History  Substance Use Topics  . Smoking status: Current Every Day Smoker -- 1.00 packs/day for 29 years    Types: Cigarettes  . Smokeless tobacco: Never Used  . Alcohol Use: 1.0 oz/week    2 drink(s) per week     Comment: socially   OB History    Gravida Para Term Preterm AB TAB SAB Ectopic Multiple Living   4 2 2  2  1 1  2      Review of Systems ROS +'ve dizzy, nausea  Denies: HEADACHE, NAUSEA, ABDOMINAL PAIN, CHEST PAIN, CONGESTION, DYSURIA, SHORTNESS OF BREATH  Allergies  Latex  Home Medications   Prior to Admission medications   Medication Sig Start Date End Date Taking? Authorizing Provider   pantoprazole (PROTONIX) 40 MG tablet Take 1 tablet (40 mg total) by mouth daily. 02/17/15  Yes Sharon [address], MD  aspirin-acetaminophen-caffeine [facility] MIGRAINE) [id number] MG per tablet Take 1 tablet by mouth every 6 (six) hours as needed for pain.    Historical Provider, MD  B Complex-C (B-COMPLEX WITH VITAMIN C) tablet Take 1 tablet by mouth daily.    Historical Provider, MD  loratadine (CLARITIN) 10 MG tablet Take 10 mg by mouth daily.    Historical Provider, MD  Omega-3 Fatty Acids (OMEGA 3 PO) Take 1 capsule by mouth daily.    Historical Provider, MD  pseudoephedrine (SUDAFED) 30 MG tablet Take 30 mg by mouth every 4 (four) hours as needed for congestion.    Historical Provider, MD   Meds Ordered and Administered this Visit  Medications - No data to display  BP 138/96 mmHg  Pulse 78  Temp(Src) 97.1 F (36.2 C) (Oral)  Resp 18  SpO2 98%  LMP 03/02/2012 No data found.   Physical Exam  Constitutional: She is oriented to person, place, and time. She appears well-developed and well-nourished. No distress.  HENT:  Head: Normocephalic and atraumatic.  Right Ear: External ear normal.  Left Ear: External ear normal.  Mouth/Throat: Oropharynx is clear and moist.  Small amount  of fluid noted behind the right TM. Left TM is clear.  Eyes: Conjunctivae and EOM are normal. Pupils are equal, round, and reactive to light. Left eye exhibits no nystagmus.  No nystagmus is noted.  Neck: Normal range of motion. Neck supple.  Pulmonary/Chest: Effort normal and breath sounds normal.  Abdominal: Soft.  Musculoskeletal: Normal range of motion.  Neurological: She is alert and oriented to person, place, and time.  Skin: Skin is warm and dry.  Psychiatric: She has a normal mood and affect. Her behavior is normal. Judgment and thought content normal.  Nursing note and vitals reviewed.   ED Course  Procedures (including critical care time)  Labs Review Labs Reviewed  GLUCOSE,  CAPILLARY  88  Imaging Review No results found.   Visual Acuity Review  Right Eye Distance:   Left Eye Distance:   Bilateral Distance:    Right Eye Near:   Left Eye Near:    Bilateral Near:         MDM   1. BPV (benign positional vertigo), unspecified laterality      No indication for emergent brain imaging today. Patient has a normal neurologic exam today. She is able to ambulate without gait disturbance. She has no list at this time. Prescription for meclizine and 5 mg 3 times a day. She is advised to follow-up with her primary care provider. She's also advised to follow instructions were for her PCP for her prediabetic state.      Konrad Felix, [place] 10/29/15 [phone]

## 2015-10-29 NOTE — Discharge Instructions (Signed)
Benign Positional Vertigo °Vertigo is the feeling that you or your surroundings are moving when they are not. Benign positional vertigo is the most common form of vertigo. The cause of this condition is not serious (is benign). This condition is triggered by certain movements and positions (is positional). This condition can be dangerous if it occurs while you are doing something that could endanger you or others, such as driving.  °CAUSES °In many cases, the cause of this condition is not known. It may be caused by a disturbance in an area of the inner ear that helps your brain to sense movement and balance. This disturbance can be caused by a viral infection (labyrinthitis), head injury, or repetitive motion. °RISK FACTORS °This condition is more likely to develop in: °1. Women. °2. People who are 50 years of age or older. °SYMPTOMS °Symptoms of this condition usually happen when you move your head or your eyes in different directions. Symptoms may start suddenly, and they usually last for less than a minute. Symptoms may include: °· Loss of balance and falling. °· Feeling like you are spinning or moving. °· Feeling like your surroundings are spinning or moving. °· Nausea and vomiting. °· Blurred vision. °· Dizziness. °· Involuntary eye movement (nystagmus). °Symptoms can be mild and cause only slight annoyance, or they can be severe and interfere with daily life. Episodes of benign positional vertigo may return (recur) over time, and they may be triggered by certain movements. Symptoms may improve over time. °DIAGNOSIS °This condition is usually diagnosed by medical history and a physical exam of the head, neck, and ears. You may be referred to a health care provider who specializes in ear, nose, and throat (ENT) problems (otolaryngologist) or a provider who specializes in disorders of the nervous system (neurologist). You may have additional testing, including: °· MRI. °· A CT scan. °· Eye movement tests. Your  health care provider may ask you to change positions quickly while he or she watches you for symptoms of benign positional vertigo, such as nystagmus. Eye movement may be tested with an electronystagmogram (ENG), caloric stimulation, the Dix-Hallpike test, or the roll test. °· An electroencephalogram (EEG). This records electrical activity in your brain. °· Hearing tests. °TREATMENT °Usually, your health care provider will treat this by moving your head in specific positions to adjust your inner ear back to normal. Surgery may be needed in severe cases, but this is rare. In some cases, benign positional vertigo may resolve on its own in 2-4 weeks. °HOME CARE INSTRUCTIONS °Safety °· Move slowly. Avoid sudden body or head movements. °· Avoid driving. °· Avoid operating heavy machinery. °· Avoid doing any tasks that would be dangerous to you or others if a vertigo episode would occur. °· If you have trouble walking or keeping your balance, try using a cane for stability. If you feel dizzy or unstable, sit down right away. °· Return to your normal activities as told by your health care provider. Ask your health care provider what activities are safe for you. °General Instructions °· Take over-the-counter and prescription medicines only as told by your health care provider. °· Avoid certain positions or movements as told by your health care provider. °· Drink enough fluid to keep your urine clear or pale yellow. °· Keep all follow-up visits as told by your health care provider. This is important. °SEEK MEDICAL CARE IF: °· You have a fever. °· Your condition gets worse or you develop new symptoms. °· Your family or friends   notice any behavioral changes. °· Your nausea or vomiting gets worse. °· You have numbness or a "pins and needles" sensation. °SEEK IMMEDIATE MEDICAL CARE IF: °· You have difficulty speaking or moving. °· You are always dizzy. °· You faint. °· You develop severe headaches. °· You have weakness in your  legs or arms. °· You have changes in your hearing or vision. °· You develop a stiff neck. °· You develop sensitivity to light. °  °This information is not intended to replace advice given to you by your health care provider. Make sure you discuss any questions you have with your health care provider. °  °Document Released: 07/31/2006 Document Revised: 07/14/2015 Document Reviewed: 02/15/2015 °Elsevier Interactive Patient Education ©2016 [employer] ° °Dizziness °Dizziness is a common problem. It makes you feel unsteady or lightheaded. You may feel like you are about to pass out (faint). Dizziness can lead to injury if you stumble or fall. Anyone can get dizzy, but dizziness is more common in older adults. This condition can be caused by a number of things, including: °3. Medicines. °4. Dehydration. °5. Illness. °HOME CARE °Following these instructions may help with your condition: °Eating and Drinking °· Drink enough fluid to keep your pee (urine) clear or pale yellow. This helps to keep you from getting dehydrated. Try to drink more clear fluids, such as water. °· Do not drink alcohol. °· Limit how much caffeine you drink or eat if told by your doctor. °· Limit how much salt you drink or eat if told by your doctor. °Activity °· Avoid making quick movements. °· When you stand up from sitting in a chair, steady yourself until you feel okay. °· In the morning, first sit up on the side of the bed. When you feel okay, stand slowly while you hold onto something. Do this until you know that your balance is fine. °· Move your legs often if you need to stand in one place for a long time. Tighten and relax your muscles in your legs while you are standing. °· Do not drive or use heavy machinery if you feel dizzy. °· Avoid bending down if you feel dizzy. Place items in your home so that they are easy for you to reach without leaning over. °Lifestyle °· Do not use any tobacco products, including cigarettes, chewing tobacco,  or electronic cigarettes. If you need help quitting, ask your doctor. °· Try to lower your stress level, such as with yoga or meditation. Talk with your doctor if you need help. °General Instructions °· Watch your dizziness for any changes. °· Take medicines only as told by your doctor. Talk with your doctor if you think that your dizziness is caused by a medicine that you are taking. °· Tell a friend or a family member that you are feeling dizzy. If he or she notices any changes in your behavior, have this person call your doctor. °· Keep all follow-up visits as told by your doctor. This is important. °GET HELP IF: °· Your dizziness does not go away. °· Your dizziness or light-headedness gets worse. °· You feel sick to your stomach (nauseous). °· You have trouble hearing. °· You have new symptoms. °· You are unsteady on your feet or you feel like the room is spinning. °GET HELP RIGHT AWAY IF: °· You throw up (vomit) or have diarrhea and are unable to eat or drink anything. °· You have trouble: °¨ Talking. °¨ Walking. °¨ Swallowing. °¨ Using your arms, hands, or legs. °·   You feel generally weak. °· You are not thinking clearly or you have trouble forming sentences. It may take a friend or family member to notice this. °· You have: °¨ Chest pain. °¨ Pain in your belly (abdomen). °¨ Shortness of breath. °¨ Sweating. °· Your vision changes. °· You are bleeding. °· You have a headache. °· You have neck pain or a stiff neck. °· You have a fever. °  °This information is not intended to replace advice given to you by your health care provider. Make sure you discuss any questions you have with your health care provider. °  °Document Released: 10/12/2011 Document Revised: 03/09/2015 Document Reviewed: 10/19/2014 °Elsevier Interactive Patient Education ©2016 [employer] ° ° °Epley Maneuver Self-Care °WHAT IS THE EPLEY MANEUVER? °The Epley maneuver is an exercise you can do to relieve symptoms of benign paroxysmal positional  vertigo (BPPV). This condition is often just referred to as vertigo. BPPV is caused by the movement of tiny crystals (canaliths) inside your inner ear. The accumulation and movement of canaliths in your inner ear causes a sudden spinning sensation (vertigo) when you move your head to certain positions. Vertigo usually lasts about 30 seconds. BPPV usually occurs in just one ear. If you get vertigo when you lie on your left side, you probably have BPPV in your left ear. Your health care provider can tell you which ear is involved.  °BPPV may be caused by a head injury. Many people older than 50 get BPPV for unknown reasons. If you have been diagnosed with BPPV, your health care provider may teach you how to do this maneuver. BPPV is not life threatening (benign) and usually goes away in time.  °WHEN SHOULD I PERFORM THE EPLEY MANEUVER? °You can do this maneuver at home whenever you have symptoms of vertigo. You may do the Epley maneuver up to 3 times a day until your symptoms of vertigo go away. °HOW SHOULD I DO THE EPLEY MANEUVER? °6. Sit on the edge of a bed or table with your back straight. Your legs should be extended or hanging over the edge of the bed or table.   °7. Turn your head halfway toward the affected ear.   °8. Lie backward quickly with your head turned until you are lying flat on your back. You may want to position a pillow under your shoulders.   °9. Hold this position for 30 seconds. You may experience an attack of vertigo. This is normal. Hold this position until the vertigo stops. °10. Then turn your head to the opposite direction until your unaffected ear is facing the floor.   °11. Hold this position for 30 seconds. You may experience an attack of vertigo. This is normal. Hold this position until the vertigo stops. °12. Now turn your whole body to the same side as your head. Hold for another 30 seconds.   °13. You can then sit back up. °ARE THERE RISKS TO THIS MANEUVER? °In some cases, you may  have other symptoms (such as changes in your vision, weakness, or numbness). If you have these symptoms, stop doing the maneuver and call your health care provider. Even if doing these maneuvers relieves your vertigo, you may still have dizziness. Dizziness is the sensation of light-headedness but without the sensation of movement. Even though the Epley maneuver may relieve your vertigo, it is possible that your symptoms will return within 5 years. °WHAT SHOULD I DO AFTER THIS MANEUVER? °After doing the Epley maneuver, you can return to your normal activities. Ask   your doctor if there is anything you should do at home to prevent vertigo. This may include: °· Sleeping with two or more pillows to keep your head elevated. °· Not sleeping on the side of your affected ear. °· Getting up slowly from bed. °· Avoiding sudden movements during the day. °· Avoiding extreme head movement, like looking up or bending over. °· Wearing a cervical collar to prevent sudden head movements. °WHAT SHOULD I DO IF MY SYMPTOMS GET WORSE? °Call your health care provider if your vertigo gets worse. Call your provider right way if you have other symptoms, including:  °· Nausea. °· Vomiting. °· Headache. °· Weakness. °· Numbness. °· Vision changes. °  °This information is not intended to replace advice given to you by your health care provider. Make sure you discuss any questions you have with your health care provider. °  °Document Released: 10/28/2013 Document Reviewed: 10/28/2013 °Elsevier Interactive Patient Education ©2016 [employer] ° ° °

## 2015-10-29 NOTE — ED Notes (Signed)
C/o feeling dizzy onset this am associated w/nausea States sx increase when she bends over Also reports concussion x1 month ago A&O x4... No acute distress Friend at bed side

## 2015-11-04 ENCOUNTER — Encounter (HOSPITAL_COMMUNITY): Payer: Self-pay | Admitting: *Deleted

## 2015-11-04 ENCOUNTER — Emergency Department (HOSPITAL_COMMUNITY)
Admission: EM | Admit: 2015-11-04 | Discharge: 2015-11-04 | Disposition: A | Discharge status: Home / Self Care | Payer: No Typology Code available for payment source | Attending: Emergency Medicine | Admitting: Emergency Medicine

## 2015-11-04 DIAGNOSIS — K219 Gastro-esophageal reflux disease without esophagitis: Secondary | ICD-10-CM | POA: Insufficient documentation

## 2015-11-04 DIAGNOSIS — Z91040 Latex allergy status: Secondary | ICD-10-CM | POA: Insufficient documentation

## 2015-11-04 DIAGNOSIS — Z8739 Personal history of other diseases of the musculoskeletal system and connective tissue: Secondary | ICD-10-CM | POA: Insufficient documentation

## 2015-11-04 DIAGNOSIS — Z79899 Other long term (current) drug therapy: Secondary | ICD-10-CM | POA: Insufficient documentation

## 2015-11-04 DIAGNOSIS — Z87891 Personal history of nicotine dependence: Secondary | ICD-10-CM | POA: Insufficient documentation

## 2015-11-04 DIAGNOSIS — G43909 Migraine, unspecified, not intractable, without status migrainosus: Secondary | ICD-10-CM | POA: Insufficient documentation

## 2015-11-04 DIAGNOSIS — H8110 Benign paroxysmal vertigo, unspecified ear: Secondary | ICD-10-CM | POA: Insufficient documentation

## 2015-11-04 HISTORY — DX: Gastro-esophageal reflux disease without esophagitis: K21.9

## 2015-11-04 MED ORDER — MECLIZINE HCL 50 MG PO TABS: 50.0000 mg | ORAL_TABLET | Freq: Three times a day (TID) | ORAL | Status: DC | PRN

## 2015-11-04 NOTE — ED Notes (Signed)
Pt states that she was diagnosed with a concussion approx 1 month ago; pt states that she has been dizzy since last Fri; pt states that she was seen at the [facility] and diagnosed with Vertigo; pt states that she saw her PCP and was told that she may have UTI but was waiting for the micro from lab; pt states that she has been taking Meclizine for the vertigo without relief; pt states that yesterday she could ot find her car in the parking lot or remember what store she went in to

## 2015-11-04 NOTE — ED Provider Notes (Signed)
CSN: [id number]     Arrival date & time 11/04/15  1931 History   First MD Initiated Contact with Patient 11/04/15 2115     Chief Complaint  Patient presents with  . Dizziness     (Consider location/radiation/quality/duration/timing/severity/associated sxs/prior Treatment) HPI Comments: Pt comes in with cc of dizziness since last Friday. Pt describes the dizziness is spinning sensation that lasts only for a few seconds. Symptoms are intermittent, and severe, and are typically provoked by sitting up or laying flat. She saw urgent care, and was given meclezine 12.5 mg - she tried it for few days, had no relief so stopped. She comes in to the ER because symptoms are still present. Pt also states that she had an episode yday where she was looking for a store, and wasn't able to find the store. Same with her car. She was confused. She has no numbness, tingling, weakness. She has no medical hx, and quit smoking.   ROS 10 Systems reviewed and are negative for acute change except as noted in the HPI.     Patient is a 44 y.o. female presenting with dizziness. The history is provided by the patient.  Dizziness   Past Medical History  Diagnosis Date  . Allergy     latex  . Abnormal Pap smear   . Seasonal allergies   . Shortness of breath     with exercise - smoker  . Migraines     otc meds prn  . Shoulder pain, right     otc meds  . Plantar fasciitis, bilateral   . GERD (gastroesophageal reflux disease)    Past Surgical History  Procedure Laterality Date  . Bladder suspension  2009  . Tubal ligation  1999  . Svd      x 2  . Dilation and curettage of uterus      hx mab  . Diagnostic laparoscopy      ectopic pregnancy  . Vaginal hysterectomy  03/13/2012    Procedure: HYSTERECTOMY VAGINAL;  Surgeon: Mora Bellman, MD;  Location: [facility];  Service: Gynecology;  Laterality: N/A;  . Abdominal hysterectomy     Family History  Problem Relation Age of Onset  . Cancer Father    stomach  . Diabetes Maternal Grandmother    Social History  Substance Use Topics  . Smoking status: Former Smoker -- 1.00 packs/day for 29 years    Types: Cigarettes    Quit date: 09/04/2015  . Smokeless tobacco: Never Used  . Alcohol Use: 1.0 oz/week    2 Standard drinks or equivalent per week     Comment: socially   OB History    Gravida Para Term Preterm AB TAB SAB Ectopic Multiple Living   4 2 2  2  1 1  2      Review of Systems  Neurological: Positive for dizziness.      Allergies  Latex  Home Medications   Prior to Admission medications   Medication Sig Start Date End Date Taking? Authorizing Provider  aspirin-acetaminophen-caffeine (EXCEDRIN MIGRAINE) 9473003539 MG per tablet Take 1 tablet by mouth every 6 (six) hours as needed for pain.   Yes Historical Provider, MD  aspirin-sod bicarb-citric acid (ALKA-SELTZER) 325 MG TBEF tablet Take 325 mg by mouth every 6 (six) hours as needed (cold symptoms).   Yes Historical Provider, MD  Cyanocobalamin (B-12 PO) Take 1 tablet by mouth daily.   Yes Historical Provider, MD  ibuprofen (ADVIL,MOTRIN) 200 MG tablet Take 400 mg by mouth  every 6 (six) hours as needed for headache or moderate pain.   Yes Historical Provider, MD  loratadine (CLARITIN) 10 MG tablet Take 10 mg by mouth daily.   Yes Historical Provider, MD  Omega-3 Fatty Acids (OMEGA 3 PO) Take 1 capsule by mouth daily.   Yes Historical Provider, MD  pantoprazole (PROTONIX) 40 MG tablet Take 1 tablet (40 mg total) by mouth daily. 02/17/15  Yes Sharon [address], MD  Pseudoephedrine HCl (SUDAFED 24 HOUR PO) Take 1 tablet by mouth daily.   Yes Historical Provider, MD  sodium chloride (OCEAN) 0.65 % SOLN nasal spray Place 2 sprays into both nostrils as needed for congestion.   Yes Historical Provider, MD  meclizine (ANTIVERT) 50 MG tablet Take 1 tablet (50 mg total) by mouth 3 (three) times daily as needed. 11/04/15   Tanda Morrissey, MD   BP 128/93 mmHg  Pulse 82   Temp(Src) 98.2 F (36.8 C) (Oral)  Resp 16  SpO2 99%  LMP 03/02/2012 Physical Exam  Constitutional: She is oriented to person, place, and time. She appears well-developed.  HENT:  Head: Normocephalic and atraumatic.  [name] done - symptoms reproduced, however no nystagmus appreciated. HINTs exam also made the patient get dizzy - no nystagmus seen.  Eyes: EOM are normal.  Neck: Normal range of motion. Neck supple.  Cardiovascular: Normal rate.   Pulmonary/Chest: Effort normal.  Abdominal: Bowel sounds are normal.  Musculoskeletal: She exhibits no edema.  Neurological: She is alert and oriented to person, place, and time. No cranial nerve deficit. Coordination normal.  Skin: Skin is warm and dry.  Nursing note and vitals reviewed.   ED Course  Procedures (including critical care time) Labs Review Labs Reviewed - No data to display  Imaging Review No results found. I have personally reviewed and evaluated these images and lab results as part of my medical decision-making.   EKG Interpretation None      MDM   Final diagnoses:  BPPV (benign paroxysmal positional vertigo), unspecified laterality    DDx includes:  Peripheral Vertigo:  BPPV  Vestibular neuritis  Meniere disease  Migrainous vertigo  Ear Infection   Pt appears to be having BPPV. She cannot tolerate Valium, so we will continue Antivert, but at 50 mg tid. She has been provided with the epley's maneuver/excercises.  ENT information given if not improving.      Varney Biles, MD 11/04/15 [phone]

## 2015-11-04 NOTE — Discharge Instructions (Signed)
Take the meds prescribed. Do the exercises provided.   Benign Positional Vertigo Vertigo is the feeling that you or your surroundings are moving when they are not. Benign positional vertigo is the most common form of vertigo. The cause of this condition is not serious (is benign). This condition is triggered by certain movements and positions (is positional). This condition can be dangerous if it occurs while you are doing something that could endanger you or others, such as driving.  CAUSES In many cases, the cause of this condition is not known. It may be caused by a disturbance in an area of the inner ear that helps your brain to sense movement and balance. This disturbance can be caused by a viral infection (labyrinthitis), head injury, or repetitive motion. RISK FACTORS This condition is more likely to develop in:  Women.  People who are 84 years of age or older. SYMPTOMS Symptoms of this condition usually happen when you move your head or your eyes in different directions. Symptoms may start suddenly, and they usually last for less than a minute. Symptoms may include:  Loss of balance and falling.  Feeling like you are spinning or moving.  Feeling like your surroundings are spinning or moving.  Nausea and vomiting.  Blurred vision.  Dizziness.  Involuntary eye movement (nystagmus). Symptoms can be mild and cause only slight annoyance, or they can be severe and interfere with daily life. Episodes of benign positional vertigo may return (recur) over time, and they may be triggered by certain movements. Symptoms may improve over time. DIAGNOSIS This condition is usually diagnosed by medical history and a physical exam of the head, neck, and ears. You may be referred to a health care provider who specializes in ear, nose, and throat (ENT) problems (otolaryngologist) or a provider who specializes in disorders of the nervous system (neurologist). You may have additional testing,  including:  MRI.  A CT scan.  Eye movement tests. Your health care provider may ask you to change positions quickly while he or she watches you for symptoms of benign positional vertigo, such as nystagmus. Eye movement may be tested with an electronystagmogram (ENG), caloric stimulation, the Dix-Hallpike test, or the roll test.  An electroencephalogram (EEG). This records electrical activity in your brain.  Hearing tests. TREATMENT Usually, your health care provider will treat this by moving your head in specific positions to adjust your inner ear back to normal. Surgery may be needed in severe cases, but this is rare. In some cases, benign positional vertigo may resolve on its own in 2-4 weeks. HOME CARE INSTRUCTIONS Safety  Move slowly.Avoid sudden body or head movements.  Avoid driving.  Avoid operating heavy machinery.  Avoid doing any tasks that would be dangerous to you or others if a vertigo episode would occur.  If you have trouble walking or keeping your balance, try using a cane for stability. If you feel dizzy or unstable, sit down right away.  Return to your normal activities as told by your health care provider. Ask your health care provider what activities are safe for you. General Instructions  Take over-the-counter and prescription medicines only as told by your health care provider.  Avoid certain positions or movements as told by your health care provider.  Drink enough fluid to keep your urine clear or pale yellow.  Keep all follow-up visits as told by your health care provider. This is important. SEEK MEDICAL CARE IF:  You have a fever.  Your condition gets worse  or you develop new symptoms.  Your family or friends notice any behavioral changes.  Your nausea or vomiting gets worse.  You have numbness or a "pins and needles" sensation. SEEK IMMEDIATE MEDICAL CARE IF:  You have difficulty speaking or moving.  You are always dizzy.  You  faint.  You develop severe headaches.  You have weakness in your legs or arms.  You have changes in your hearing or vision.  You develop a stiff neck.  You develop sensitivity to light.   This information is not intended to replace advice given to you by your health care provider. Make sure you discuss any questions you have with your health care provider.   Document Released: 07/31/2006 Document Revised: 07/14/2015 Document Reviewed: 02/15/2015 Elsevier Interactive Patient Education 2016 [employer].  [employer] [facility] WHAT IS THE EPLEY MANEUVER? The Epley maneuver is an exercise you can do to relieve symptoms of benign paroxysmal positional vertigo (BPPV). This condition is often just referred to as vertigo. BPPV is caused by the movement of tiny crystals (canaliths) inside your inner ear. The accumulation and movement of canaliths in your inner ear causes a sudden spinning sensation (vertigo) when you move your head to certain positions. Vertigo usually lasts about 30 seconds. BPPV usually occurs in just one ear. If you get vertigo when you lie on your left side, you probably have BPPV in your left ear. Your health care provider can tell you which ear is involved.  BPPV may be caused by a head injury. Many people older than 50 get BPPV for unknown reasons. If you have been diagnosed with BPPV, your health care provider may teach you how to do this maneuver. BPPV is not life threatening (benign) and usually goes away in time.  WHEN SHOULD I PERFORM THE EPLEY MANEUVER? You can do this maneuver at home whenever you have symptoms of vertigo. You may do the Epley maneuver up to 3 times a day until your symptoms of vertigo go away. HOW SHOULD I DO THE EPLEY MANEUVER?  Sit on the edge of a bed or table with your back straight. Your legs should be extended or hanging over the edge of the bed or table.   Turn your head halfway toward the affected ear.   Lie backward quickly with  your head turned until you are lying flat on your back. You may want to position a pillow under your shoulders.   Hold this position for 30 seconds. You may experience an attack of vertigo. This is normal. Hold this position until the vertigo stops.  Then turn your head to the opposite direction until your unaffected ear is facing the floor.   Hold this position for 30 seconds. You may experience an attack of vertigo. This is normal. Hold this position until the vertigo stops.  Now turn your whole body to the same side as your head. Hold for another 30 seconds.   You can then sit back up. ARE THERE RISKS TO THIS MANEUVER? In some cases, you may have other symptoms (such as changes in your vision, weakness, or numbness). If you have these symptoms, stop doing the maneuver and call your health care provider. Even if doing these maneuvers relieves your vertigo, you may still have dizziness. Dizziness is the sensation of light-headedness but without the sensation of movement. Even though the Epley maneuver may relieve your vertigo, it is possible that your symptoms will return within 5 years. WHAT SHOULD I DO AFTER THIS MANEUVER?  After doing the Epley maneuver, you can return to your normal activities. Ask your doctor if there is anything you should do at home to prevent vertigo. This may include:  Sleeping with two or more pillows to keep your head elevated.  Not sleeping on the side of your affected ear.  Getting up slowly from bed.  Avoiding sudden movements during the day.  Avoiding extreme head movement, like looking up or bending over.  Wearing a cervical collar to prevent sudden head movements. WHAT SHOULD I DO IF MY SYMPTOMS GET WORSE? Call your health care provider if your vertigo gets worse. Call your provider right way if you have other symptoms, including:   Nausea.  Vomiting.  Headache.  Weakness.  Numbness.  Vision changes.   This information is not intended to  replace advice given to you by your health care provider. Make sure you discuss any questions you have with your health care provider.   Document Released: 10/28/2013 Document Reviewed: 10/28/2013 Elsevier Interactive Patient Education [employer].  Vertigo Vertigo means you feel like you or your surroundings are moving when they are not. Vertigo can be dangerous if it occurs when you are at work, driving, or performing difficult activities.  CAUSES  Vertigo occurs when there is a conflict of signals sent to your brain from the visual and sensory systems in your body. There are many different causes of vertigo, including:  Infections, especially in the inner ear.  A bad reaction to a drug or misuse of alcohol and medicines.  Withdrawal from drugs or alcohol.  Rapidly changing positions, such as lying down or rolling over in bed.  A migraine headache.  Decreased blood flow to the brain.  Increased pressure in the brain from a head injury, infection, tumor, or bleeding. SYMPTOMS  You may feel as though the world is spinning around or you are falling to the ground. Because your balance is upset, vertigo can cause nausea and vomiting. You may have involuntary eye movements (nystagmus). DIAGNOSIS  Vertigo is usually diagnosed by physical exam. If the cause of your vertigo is unknown, your caregiver may perform imaging tests, such as an MRI scan (magnetic resonance imaging). TREATMENT  Most cases of vertigo resolve on their own, without treatment. Depending on the cause, your caregiver may prescribe certain medicines. If your vertigo is related to body position issues, your caregiver may recommend movements or procedures to correct the problem. In rare cases, if your vertigo is caused by certain inner ear problems, you may need surgery. HOME CARE INSTRUCTIONS   Follow your caregiver's instructions.  Avoid driving.  Avoid operating heavy machinery.  Avoid performing any tasks  that would be dangerous to you or others during a vertigo episode.  Tell your caregiver if you notice that certain medicines seem to be causing your vertigo. Some of the medicines used to treat vertigo episodes can actually make them worse in some people. SEEK IMMEDIATE MEDICAL CARE IF:   Your medicines do not relieve your vertigo or are making it worse.  You develop problems with talking, walking, weakness, or using your arms, hands, or legs.  You develop severe headaches.  Your nausea or vomiting continues or gets worse.  You develop visual changes.  A family member notices behavioral changes.  Your condition gets worse. MAKE SURE YOU:  Understand these instructions.  Will watch your condition.  Will get help right away if you are not doing well or get worse.   This information is  not intended to replace advice given to you by your health care provider. Make sure you discuss any questions you have with your health care provider.   Document Released: 08/02/2005 Document Revised: 01/15/2012 Document Reviewed: 02/15/2015 Elsevier Interactive Patient Education [employer].

## 2016-01-21 ENCOUNTER — Other Ambulatory Visit (HOSPITAL_COMMUNITY)
Admission: RE | Admit: 2016-01-21 | Discharge: 2016-01-21 | Disposition: A | Discharge status: Home / Self Care | Payer: Self-pay | Source: Ambulatory Visit | Attending: Family Medicine | Admitting: Family Medicine

## 2016-01-21 ENCOUNTER — Encounter (HOSPITAL_COMMUNITY): Payer: Self-pay

## 2016-01-21 ENCOUNTER — Emergency Department (INDEPENDENT_AMBULATORY_CARE_PROVIDER_SITE_OTHER)
Admission: EM | Admit: 2016-01-21 | Discharge: 2016-01-21 | Disposition: A | Discharge status: Home / Self Care | Payer: Self-pay | Source: Home / Self Care | Attending: Family Medicine | Admitting: Family Medicine

## 2016-01-21 DIAGNOSIS — N760 Acute vaginitis: Secondary | ICD-10-CM

## 2016-01-21 DIAGNOSIS — A499 Bacterial infection, unspecified: Secondary | ICD-10-CM

## 2016-01-21 DIAGNOSIS — B3731 Acute candidiasis of vulva and vagina: Secondary | ICD-10-CM

## 2016-01-21 DIAGNOSIS — N898 Other specified noninflammatory disorders of vagina: Secondary | ICD-10-CM

## 2016-01-21 DIAGNOSIS — Z113 Encounter for screening for infections with a predominantly sexual mode of transmission: Secondary | ICD-10-CM | POA: Insufficient documentation

## 2016-01-21 DIAGNOSIS — B9689 Other specified bacterial agents as the cause of diseases classified elsewhere: Secondary | ICD-10-CM

## 2016-01-21 DIAGNOSIS — B373 Candidiasis of vulva and vagina: Secondary | ICD-10-CM

## 2016-01-21 DIAGNOSIS — Z202 Contact with and (suspected) exposure to infections with a predominantly sexual mode of transmission: Secondary | ICD-10-CM

## 2016-01-21 MED ORDER — FLUCONAZOLE 150 MG PO TABS: ORAL_TABLET | ORAL | Status: DC

## 2016-01-21 MED ORDER — METRONIDAZOLE 500 MG PO TABS: 500.0000 mg | ORAL_TABLET | Freq: Two times a day (BID) | ORAL | Status: DC

## 2016-01-21 NOTE — ED Notes (Signed)
Patient states she is having a dark yellow brownish vaginal discharge x2 weeks Patient states condom broke during sex ad she would like to be tested for STD No acute distress

## 2016-01-21 NOTE — Discharge Instructions (Signed)
Bacterial Vaginosis Bacterial vaginosis is a vaginal infection that occurs when the normal balance of bacteria in the vagina is disrupted. It results from an overgrowth of certain bacteria. This is the most common vaginal infection in women of childbearing age. Treatment is important to prevent complications, especially in pregnant women, as it can cause a premature delivery. CAUSES  Bacterial vaginosis is caused by an increase in harmful bacteria that are normally present in smaller amounts in the vagina. Several different kinds of bacteria can cause bacterial vaginosis. However, the reason that the condition develops is not fully understood. RISK FACTORS Certain activities or behaviors can put you at an increased risk of developing bacterial vaginosis, including:  Having a new sex partner or multiple sex partners.  Douching.  Using an intrauterine device (IUD) for contraception. Women do not get bacterial vaginosis from toilet seats, bedding, swimming pools, or contact with objects around them. SIGNS AND SYMPTOMS  Some women with bacterial vaginosis have no signs or symptoms. Common symptoms include:  Grey vaginal discharge.  A fishlike odor with discharge, especially after sexual intercourse.  Itching or burning of the vagina and vulva.  Burning or pain with urination. DIAGNOSIS  Your health care provider will take a medical history and examine the vagina for signs of bacterial vaginosis. A sample of vaginal fluid may be taken. Your health care provider will look at this sample under a microscope to check for bacteria and abnormal cells. A vaginal pH test may also be done.  TREATMENT  Bacterial vaginosis may be treated with antibiotic medicines. These may be given in the form of a pill or a vaginal cream. A second round of antibiotics may be prescribed if the condition comes back after treatment. Because bacterial vaginosis increases your risk for sexually transmitted diseases, getting  treated can help reduce your risk for chlamydia, gonorrhea, HIV, and herpes. HOME CARE INSTRUCTIONS   Only take over-the-counter or prescription medicines as directed by your health care provider.  If antibiotic medicine was prescribed, take it as directed. Make sure you finish it even if you start to feel better.  Tell all sexual partners that you have a vaginal infection. They should see their health care provider and be treated if they have problems, such as a mild rash or itching.  During treatment, it is important that you follow these instructions:  Avoid sexual activity or use condoms correctly.  Do not douche.  Avoid alcohol as directed by your health care provider.  Avoid breastfeeding as directed by your health care provider. SEEK MEDICAL CARE IF:   Your symptoms are not improving after 3 days of treatment.  You have increased discharge or pain.  You have a fever. MAKE SURE YOU:   Understand these instructions.  Will watch your condition.  Will get help right away if you are not doing well or get worse. FOR MORE INFORMATION  Centers for Disease Control and Prevention, Division of STD Prevention: [employer] American Sexual Health Association (ASHA): www.ashastd.org    This information is not intended to replace advice given to you by your health care provider. Make sure you discuss any questions you have with your health care provider.   Document Released: 10/23/2005 Document Revised: 11/13/2014 Document Reviewed: 06/04/2013 Elsevier Interactive Patient Education 2016 [employer].  Sexually Transmitted Disease A sexually transmitted disease (STD) is a disease or infection that may be passed (transmitted) from person to person, usually during sexual activity. This may happen by way of saliva, semen, blood, vaginal  mucus, or urine. Common STDs include:  Gonorrhea.  Chlamydia.  Syphilis.  HIV and AIDS.  Genital herpes.  Hepatitis B and  C.  Trichomonas.  Human papillomavirus (HPV).  Pubic lice.  Scabies.  Mites.  Bacterial vaginosis. WHAT ARE CAUSES OF STDs? An STD may be caused by bacteria, a virus, or parasites. STDs are often transmitted during sexual activity if one person is infected. However, they may also be transmitted through nonsexual means. STDs may be transmitted after:   Sexual intercourse with an infected person.  Sharing sex toys with an infected person.  Sharing needles with an infected person or using unclean piercing or tattoo needles.  Having intimate contact with the genitals, mouth, or rectal areas of an infected person.  Exposure to infected fluids during birth. WHAT ARE THE SIGNS AND SYMPTOMS OF STDs? Different STDs have different symptoms. Some people may not have any symptoms. If symptoms are present, they may include:  Painful or bloody urination.  Pain in the pelvis, abdomen, vagina, anus, throat, or eyes.  A skin rash, itching, or irritation.  Growths, ulcerations, blisters, or sores in the genital and anal areas.  Abnormal vaginal discharge with or without bad odor.  Penile discharge in men.  Fever.  Pain or bleeding during sexual intercourse.  Swollen glands in the groin area.  Yellow skin and eyes (jaundice). This is seen with hepatitis.  Swollen testicles.  Infertility.  Sores and blisters in the mouth. HOW ARE STDs DIAGNOSED? To make a diagnosis, your health care provider may:  Take a medical history.  Perform a physical exam.  Take a sample of any discharge to examine.  Swab the throat, cervix, opening to the penis, rectum, or vagina for testing.  Test a sample of your first morning urine.  Perform blood tests.  Perform a Pap test, if this applies.  Perform a colposcopy.  Perform a laparoscopy. HOW ARE STDs TREATED? Treatment depends on the STD. Some STDs may be treated but not cured.  Chlamydia, gonorrhea, trichomonas, and syphilis can be  cured with antibiotic medicine.  Genital herpes, hepatitis, and HIV can be treated, but not cured, with prescribed medicines. The medicines lessen symptoms.  Genital warts from HPV can be treated with medicine or by freezing, burning (electrocautery), or surgery. Warts may come back.  HPV cannot be cured with medicine or surgery. However, abnormal areas may be removed from the cervix, vagina, or vulva.  If your diagnosis is confirmed, your recent sexual partners need treatment. This is true even if they are symptom-free or have a negative culture or evaluation. They should not have sex until their health care providers say it is okay.  Your health care provider may test you for infection again 3 months after treatment. HOW CAN I REDUCE MY RISK OF GETTING AN STD? Take these steps to reduce your risk of getting an STD:  Use latex condoms, dental dams, and water-soluble lubricants during sexual activity. Do not use petroleum jelly or oils.  Avoid having multiple sex partners.  Do not have sex with someone who has other sex partners  Do not have sex with anyone you do not know or who is at high risk for an STD.  Avoid risky sex practices that can break your skin.  Do not have sex if you have open sores on your mouth or skin.  Avoid drinking too much alcohol or taking illegal drugs. Alcohol and drugs can affect your judgment and put you in a vulnerable position.  Avoid engaging in oral and anal sex acts.  Get vaccinated for HPV and hepatitis. If you have not received these vaccines in the past, talk to your health care provider about whether one or both might be right for you.  If you are at risk of being infected with HIV, it is recommended that you take a prescription medicine daily to prevent HIV infection. This is called pre-exposure prophylaxis (PrEP). You are considered at risk if:  You are a man who has sex with other men (MSM).  You are a heterosexual man or woman and are  sexually active with more than one partner.  You take drugs by injection.  You are sexually active with a partner who has HIV.  Talk with your health care provider about whether you are at high risk of being infected with HIV. If you choose to begin PrEP, you should first be tested for HIV. You should then be tested every 3 months for as long as you are taking PrEP. WHAT SHOULD I DO IF I THINK I HAVE AN STD?  See your health care provider.  Tell your sexual partner(s). They should be tested and treated for any STDs.  Do not have sex until your health care provider says it is okay. WHEN SHOULD I GET IMMEDIATE MEDICAL CARE? Contact your health care provider right away if:   You have severe abdominal pain.  You are a man and notice swelling or pain in your testicles.  You are a woman and notice swelling or pain in your vagina.   This information is not intended to replace advice given to you by your health care provider. Make sure you discuss any questions you have with your health care provider.   Document Released: 01/13/2003 Document Revised: 11/13/2014 Document Reviewed: 05/13/2013 Elsevier Interactive Patient Education 2016 [place] Sex Safe sex is about reducing the risk of giving or getting a sexually transmitted disease (STD). STDs are spread through sexual contact involving the genitals, mouth, or rectum. Some STDs can be cured and others cannot. Safe sex can also prevent unintended pregnancies.  WHAT ARE SOME SAFE SEX PRACTICES?  Limit your sexual activity to only one partner who is having sex with only you.  Talk to your partner about his or her past partners, past STDs, and drug use.  Use a condom every time you have sexual intercourse. This includes vaginal, oral, and anal sexual activity. Both females and males should wear condoms during oral sex. Only use latex or polyurethane condoms and water-based lubricants. Using petroleum-based lubricants or oils to  lubricate a condom will weaken the condom and increase the chance that it will break. The condom should be in place from the beginning to the end of sexual activity. Wearing a condom reduces, but does not completely eliminate, your risk of getting or giving an STD. STDs can be spread by contact with infected body fluids and skin.  Get vaccinated for hepatitis B and HPV.  Avoid alcohol and recreational drugs, which can affect your judgment. You may forget to use a condom or participate in high-risk sex.  For females, avoid douching after sexual intercourse. Douching can spread an infection farther into the reproductive tract.  Check your body for signs of sores, blisters, rashes, or unusual discharge. See your health care provider if you notice any of these signs.  Avoid sexual contact if you have symptoms of an infection or are being treated for an STD. If you or your partner  has herpes, avoid sexual contact when blisters are present. Use condoms at all other times.  If you are at risk of being infected with HIV, it is recommended that you take a prescription medicine daily to prevent HIV infection. This is called pre-exposure prophylaxis (PrEP). You are considered at risk if:  You are a man who has sex with other men (MSM).  You are a heterosexual man or woman who is sexually active with more than one partner.  You take drugs by injection.  You are sexually active with a partner who has HIV.  Talk with your health care provider about whether you are at high risk of being infected with HIV. If you choose to begin PrEP, you should first be tested for HIV. You should then be tested every 3 months for as long as you are taking PrEP.  See your health care provider for regular screenings, exams, and tests for other STDs. Before having sex with a new partner, each of you should be screened for STDs and should talk about the results with each other. WHAT ARE THE BENEFITS OF SAFE SEX?   There is  less chance of getting or giving an STD.  You can prevent unwanted or unintended pregnancies.  By discussing safe sex concerns with your partner, you may increase feelings of intimacy, comfort, trust, and honesty between the two of you.   This information is not intended to replace advice given to you by your health care provider. Make sure you discuss any questions you have with your health care provider.   Document Released: 11/30/2004 Document Revised: 11/13/2014 Document Reviewed: 04/15/2012 Elsevier Interactive Patient Education 2016 [employer]  Monilial Vaginitis Vaginitis in a soreness, swelling and redness (inflammation) of the vagina and vulva. Monilial vaginitis is not a sexually transmitted infection. CAUSES  Yeast vaginitis is caused by yeast (candida) that is normally found in your vagina. With a yeast infection, the candida has overgrown in number to a point that upsets the chemical balance. SYMPTOMS   White, thick vaginal discharge.  Swelling, itching, redness and irritation of the vagina and possibly the lips of the vagina (vulva).  Burning or painful urination.  Painful intercourse. DIAGNOSIS  Things that may contribute to monilial vaginitis are:  Postmenopausal and virginal states.  Pregnancy.  Infections.  Being tired, sick or stressed, especially if you had monilial vaginitis in the past.  Diabetes. Good control will help lower the chance.  Birth control pills.  Tight fitting garments.  Using bubble bath, feminine sprays, douches or deodorant tampons.  Taking certain medications that kill germs (antibiotics).  Sporadic recurrence can occur if you become ill. TREATMENT  Your caregiver will give you medication.  There are several kinds of anti monilial vaginal creams and suppositories specific for monilial vaginitis. For recurrent yeast infections, use a suppository or cream in the vagina 2 times a week, or as directed.  Anti-monilial or  steroid cream for the itching or irritation of the vulva may also be used. Get your caregiver's permission.  Painting the vagina with methylene blue solution may help if the monilial cream does not work.  Eating yogurt may help prevent monilial vaginitis. HOME CARE INSTRUCTIONS   Finish all medication as prescribed.  Do not have sex until treatment is completed or after your caregiver tells you it is okay.  Take warm sitz baths.  Do not douche.  Do not use tampons, especially scented ones.  Wear cotton underwear.  Avoid tight pants and panty hose.  Tell your sexual partner that you have a yeast infection. They should go to their caregiver if they have symptoms such as mild rash or itching.  Your sexual partner should be treated as well if your infection is difficult to eliminate.  Practice safer sex. Use condoms.  Some vaginal medications cause latex condoms to fail. Vaginal medications that harm condoms are:  Cleocin cream.  Butoconazole (Femstat).  Terconazole (Terazol) vaginal suppository.  Miconazole (Monistat) (may be purchased over the counter). SEEK MEDICAL CARE IF:   You have a temperature by mouth above 102 F (38.9 C).  The infection is getting worse after 2 days of treatment.  The infection is not getting better after 3 days of treatment.  You develop blisters in or around your vagina.  You develop vaginal bleeding, and it is not your menstrual period.  You have pain when you urinate.  You develop intestinal problems.  You have pain with sexual intercourse.   This information is not intended to replace advice given to you by your health care provider. Make sure you discuss any questions you have with your health care provider.   Document Released: 08/02/2005 Document Revised: 01/15/2012 Document Reviewed: 04/26/2015 Elsevier Interactive Patient Education 2016 [employer].  Vaginitis Vaginitis is an inflammation of the vagina. It is most  often caused by a change in the normal balance of the bacteria and yeast that live in the vagina. This change in balance causes an overgrowth of certain bacteria or yeast, which causes the inflammation. There are different types of vaginitis, but the most common types are:  Bacterial vaginosis.  Yeast infection (candidiasis).  Trichomoniasis vaginitis. This is a sexually transmitted infection (STI).  Viral vaginitis.  Atrophic vaginitis.  Allergic vaginitis. CAUSES  The cause depends on the type of vaginitis. Vaginitis can be caused by:  Bacteria (bacterial vaginosis).  Yeast (yeast infection).  A parasite (trichomoniasis vaginitis)  A virus (viral vaginitis).  Low hormone levels (atrophic vaginitis). Low hormone levels can occur during pregnancy, breastfeeding, or after menopause.  Irritants, such as bubble baths, scented tampons, and feminine sprays (allergic vaginitis). Other factors can change the normal balance of the yeast and bacteria that live in the vagina. These include:  Antibiotic medicines.  Poor hygiene.  Diaphragms, vaginal sponges, spermicides, birth control pills, and intrauterine devices (IUD).  Sexual intercourse.  Infection.  Uncontrolled diabetes.  A weakened immune system. SYMPTOMS  Symptoms can vary depending on the cause of the vaginitis. Common symptoms include:  Abnormal vaginal discharge.  The discharge is white, gray, or yellow with bacterial vaginosis.  The discharge is thick, white, and cheesy with a yeast infection.  The discharge is frothy and yellow or greenish with trichomoniasis.  A bad vaginal odor.  The odor is fishy with bacterial vaginosis.  Vaginal itching, pain, or swelling.  Painful intercourse.  Pain or burning when urinating. Sometimes, there are no symptoms. TREATMENT  Treatment will vary depending on the type of infection.   Bacterial vaginosis and trichomoniasis are often treated with antibiotic creams or  pills.  Yeast infections are often treated with antifungal medicines, such as vaginal creams or suppositories.  Viral vaginitis has no cure, but symptoms can be treated with medicines that relieve discomfort. Your sexual partner should be treated as well.  Atrophic vaginitis may be treated with an estrogen cream, pill, suppository, or vaginal ring. If vaginal dryness occurs, lubricants and moisturizing creams may help. You may be told to avoid scented soaps, sprays, or douches.  Allergic  vaginitis treatment involves quitting the use of the product that is causing the problem. Vaginal creams can be used to treat the symptoms. HOME CARE INSTRUCTIONS   Take all medicines as directed by your caregiver.  Keep your genital area clean and dry. Avoid soap and only rinse the area with water.  Avoid douching. It can remove the healthy bacteria in the vagina.  Do not use tampons or have sexual intercourse until your vaginitis has been treated. Use sanitary pads while you have vaginitis.  Wipe from front to back. This avoids the spread of bacteria from the rectum to the vagina.  Let air reach your genital area.  Wear cotton underwear to decrease moisture buildup.  Avoid wearing underwear while you sleep until your vaginitis is gone.  Avoid tight pants and underwear or nylons without a cotton panel.  Take off wet clothing (especially bathing suits) as soon as possible.  Use mild, non-scented products. Avoid using irritants, such as:  Scented feminine sprays.  Fabric softeners.  Scented detergents.  Scented tampons.  Scented soaps or bubble baths.  Practice safe sex and use condoms. Condoms may prevent the spread of trichomoniasis and viral vaginitis. SEEK MEDICAL CARE IF:   You have abdominal pain.  You have a fever or persistent symptoms for more than 2-3 days.  You have a fever and your symptoms suddenly get worse.   This information is not intended to replace advice given to  you by your health care provider. Make sure you discuss any questions you have with your health care provider.   Document Released: 08/20/2007 Document Revised: 03/09/2015 Document Reviewed: 04/04/2012 Elsevier Interactive Patient Education [employer].

## 2016-01-21 NOTE — ED Provider Notes (Signed)
CSN: [id number]     Arrival date & time 01/21/16  1613 History   First MD Initiated Contact with Patient 01/21/16 1836     Chief Complaint  Patient presents with  . Vaginal Discharge   (Consider location/radiation/quality/duration/timing/severity/associated sxs/prior Treatment) HPI Comments: Vaginal discharge for 1 week. Having sex intercourse 1 week ago and condom broke. Requests tests for STD. Denies pelvic pain or urinary sx's.  Patient is a 45 y.o. female presenting with vaginal discharge.  Vaginal Discharge Associated symptoms: no abdominal pain, no dysuria, no fever, no nausea and no vomiting     Past Medical History  Diagnosis Date  . Allergy     latex  . Abnormal Pap smear   . Seasonal allergies   . Shortness of breath     with exercise - smoker  . Migraines     otc meds prn  . Shoulder pain, right     otc meds  . Plantar fasciitis, bilateral   . GERD (gastroesophageal reflux disease)    Past Surgical History  Procedure Laterality Date  . Bladder suspension  2009  . Tubal ligation  1999  . Svd      x 2  . Dilation and curettage of uterus      hx mab  . Diagnostic laparoscopy      ectopic pregnancy  . Vaginal hysterectomy  03/13/2012    Procedure: HYSTERECTOMY VAGINAL;  Surgeon: Mora Bellman, MD;  Location: [facility];  Service: Gynecology;  Laterality: N/A;  . Abdominal hysterectomy     Family History  Problem Relation Age of Onset  . Cancer Father     stomach  . Diabetes Maternal Grandmother    Social History  Substance Use Topics  . Smoking status: Former Smoker -- 1.00 packs/day for 29 years    Types: Cigarettes    Quit date: 09/04/2015  . Smokeless tobacco: Never Used  . Alcohol Use: 1.0 oz/week    2 Standard drinks or equivalent per week     Comment: socially   OB History    Gravida Para Term Preterm AB TAB SAB Ectopic Multiple Living   4 2 2  2  1 1  2      Review of Systems  Constitutional: Negative.  Negative for fever.  HENT: Negative.    Respiratory: Negative.   Cardiovascular: Negative.   Gastrointestinal: Negative for nausea, vomiting and abdominal pain.  Genitourinary: Positive for vaginal discharge. Negative for dysuria, frequency, decreased urine volume, vaginal bleeding, menstrual problem and pelvic pain.  Musculoskeletal: Negative for back pain.  Skin: Negative.   Neurological: Negative.   Psychiatric/Behavioral: Negative.     Allergies  Latex  Home Medications   Prior to Admission medications   Medication Sig Start Date End Date Taking? Authorizing Provider  aspirin-acetaminophen-caffeine (EXCEDRIN MIGRAINE) [phone] MG per tablet Take 1 tablet by mouth every 6 (six) hours as needed for pain.   Yes Historical Provider, MD  Cyanocobalamin (B-12 PO) Take 1 tablet by mouth daily.   Yes Historical Provider, MD  loratadine (CLARITIN) 10 MG tablet Take 10 mg by mouth daily.   Yes Historical Provider, MD  Omega-3 Fatty Acids (OMEGA 3 PO) Take 1 capsule by mouth daily.   Yes Historical Provider, MD  pantoprazole (PROTONIX) 40 MG tablet Take 1 tablet (40 mg total) by mouth daily. 02/17/15  Yes Sharon [address], MD  Pseudoephedrine HCl (SUDAFED 24 HOUR PO) Take 1 tablet by mouth daily.   Yes Historical Provider, MD  aspirin-sod bicarb-citric  acid (ALKA-SELTZER) 325 MG TBEF tablet Take 325 mg by mouth every 6 (six) hours as needed (cold symptoms).    Historical Provider, MD  fluconazole (DIFLUCAN) 150 MG tablet 1 tab po x 1. May repeat in 72 hours if no improvement 01/21/16   Janne Napoleon, NP  ibuprofen (ADVIL,MOTRIN) 200 MG tablet Take 400 mg by mouth every 6 (six) hours as needed for headache or moderate pain.    Historical Provider, MD  meclizine (ANTIVERT) 50 MG tablet Take 1 tablet (50 mg total) by mouth 3 (three) times daily as needed. 11/04/15   Varney Biles, MD  metroNIDAZOLE (FLAGYL) 500 MG tablet Take 1 tablet (500 mg total) by mouth 2 (two) times daily. X 7 days 01/21/16   Janne Napoleon, NP  sodium chloride  (OCEAN) 0.65 % SOLN nasal spray Place 2 sprays into both nostrils as needed for congestion.    Historical Provider, MD   Meds Ordered and Administered this Visit  Medications - No data to display  BP 147/78 mmHg  Pulse 74  Temp(Src) 97.2 F (36.2 C) (Oral)  Resp 16  SpO2 96%  LMP 03/02/2012 No data found.   Physical Exam  Constitutional: She is oriented to person, place, and time. She appears well-developed and well-nourished. No distress.  Eyes: EOM are normal.  Neck: Normal range of motion. Neck supple.  Cardiovascular: Normal rate.   Pulmonary/Chest: Effort normal. No respiratory distress.  Abdominal: Soft. She exhibits no distension and no mass. There is no tenderness. There is no rebound and no guarding.  Genitourinary:  NEFG Labia minora and introitus with erythema and swelling Large, redundant vaginal walls due to large body habitus. Unable to reach vaginal cuff with speculum. No cervix visualized. Moderate amt of thick, flowing white vaginal discharge.  Musculoskeletal: She exhibits no edema.  Neurological: She is alert and oriented to person, place, and time. She exhibits normal muscle tone.  Skin: Skin is warm and dry.  Psychiatric: She has a normal mood and affect.  Nursing note and vitals reviewed.   ED Course  Procedures (including critical care time)  Labs Review Labs Reviewed - No data to display  Imaging Review No results found. Urinalysis dipstick with large amount of leukocytes. The machine would not printout a urinalysis due to the value being too high. There was a trace amount of blood. Otherwise normal. The patient has no urinary symptoms whatsoever. Suspect this is a contamination from the vaginal discharge.  Visual Acuity Review  Right Eye Distance:   Left Eye Distance:   Bilateral Distance:    Right Eye Near:   Left Eye Near:    Bilateral Near:         MDM   1. Vaginal discharge   2. Candida vaginitis   3. BV (bacterial vaginosis)    4. Possible exposure to STD    Meds ordered this encounter  Medications  . metroNIDAZOLE (FLAGYL) 500 MG tablet    Sig: Take 1 tablet (500 mg total) by mouth 2 (two) times daily. X 7 days    Dispense:  14 tablet    Refill:  0    Order Specific Question:  Supervising Provider    Answer:  Billy Fischer [id number]  . fluconazole (DIFLUCAN) 150 MG tablet    Sig: 1 tab po x 1. May repeat in 72 hours if no improvement    Dispense:  2 tablet    Refill:  0    Order Specific Question:  Supervising Provider  Answer:  Billy Fischer [id number]   Cervical cytology pending     Janne Napoleon, NP 01/21/16 1910  Janne Napoleon, NP 01/21/16 [id number]

## 2016-01-24 LAB — CERVICOVAGINAL ANCILLARY ONLY
CHLAMYDIA, DNA PROBE: NEGATIVE
NEISSERIA GONORRHEA: NEGATIVE

## 2016-01-25 LAB — CERVICOVAGINAL ANCILLARY ONLY: WET PREP (BD AFFIRM): POSITIVE — AB

## 2016-01-26 ENCOUNTER — Telehealth (HOSPITAL_COMMUNITY): Payer: Self-pay | Admitting: Emergency Medicine

## 2016-01-26 NOTE — ED Notes (Signed)
Called pt and notified of recent lab results from visit Pt ID'd properly... Reports feeling better and sx have subsided  Per Dr. Valere Dross,  Please let patient know that tests for gonorrhea/chlamydia were negative. Tests for gardnerella (bacterial vaginosis)/Trich were positive. Pt received rx for metronidazole and for fluconazole at [facility] visit 01/21/16. Recheck for persistent symptoms. LM  Adv pt if sx are not getting better to return and to have partner tested and treated  Pt verb understanding Education on safe sex given

## 2016-03-24 ENCOUNTER — Telehealth: Payer: Self-pay | Admitting: General Practice

## 2016-03-24 NOTE — Telephone Encounter (Signed)
APPT. REMINDER CALL, LMTCB °

## 2016-03-27 ENCOUNTER — Ambulatory Visit: Payer: Self-pay | Admitting: Internal Medicine

## 2016-04-06 ENCOUNTER — Ambulatory Visit (INDEPENDENT_AMBULATORY_CARE_PROVIDER_SITE_OTHER): Payer: Self-pay | Admitting: Internal Medicine

## 2016-04-06 ENCOUNTER — Ambulatory Visit: Payer: Self-pay

## 2016-04-06 ENCOUNTER — Encounter: Payer: Self-pay | Admitting: Internal Medicine

## 2016-04-06 VITALS — BP 142/86 | HR 63 | Temp 98.0°F | Ht 62.0 in | Wt 236.5 lb

## 2016-04-06 DIAGNOSIS — K219 Gastro-esophageal reflux disease without esophagitis: Secondary | ICD-10-CM

## 2016-04-06 DIAGNOSIS — N898 Other specified noninflammatory disorders of vagina: Secondary | ICD-10-CM

## 2016-04-06 DIAGNOSIS — E669 Obesity, unspecified: Secondary | ICD-10-CM

## 2016-04-06 DIAGNOSIS — Z6841 Body Mass Index (BMI) 40.0 and over, adult: Secondary | ICD-10-CM

## 2016-04-06 MED ORDER — METRONIDAZOLE 500 MG PO TABS: 2000.0000 mg | ORAL_TABLET | Freq: Once | ORAL | Status: DC

## 2016-04-06 MED ORDER — FLUCONAZOLE 150 MG PO TABS: ORAL_TABLET | ORAL | Status: DC

## 2016-04-06 NOTE — Progress Notes (Signed)
   Patient ID: Deanna Scott female   DOB: April 13, 1971 45 y.o.   MRN: [id number]  Subjective:   HPI: [name] is a 45 y.o. former smoker with PMH of GERD, recurrent vaginal infections who presents to [facility] today for establishing care as a new patient in our clinic.   Please see problem-based charting for status of medical issues pertinent to this visit.  PMH: GERD, Recurrent vaginal bacterial, trichomoniasis, and yeast infections, vesicocele status post bladder suspension 2009, uterine prolapse status post TVH 2013, morbid obesity  PSH: Vaginal hysterectomy 2013, bladder suspension 2009  Meds (OTC allergy and sinus medications, Excedrin for migraine, omeprazole 20 mg daily for GERD  Allergies: Latex (increased vaginal discharge when using latex condoms), seasonal allergies, NKDA  Social history: Former 33-pack-year smoker. Quit in October 2016. Has not smoked since. No EtOH or recreational drug abuse. Works as a Scientist, water quality. Currently sexually active with men. Uses protection (most of the time)  Review of Systems: Pertinent items noted in HPI and remainder of comprehensive ROS otherwise negative.  Objective:  Physical Exam: Filed Vitals:   04/06/16 0916  BP: 142/86  Pulse: 63  Temp: 98 F (36.7 C)  TempSrc: Oral  Height: 5\' 2"  (1.575 m)  Weight: 236 lb 8 oz (107.276 kg)  SpO2: 100%   Gen: Well-appearing, alert and oriented to person, place, and time HEENT: Oropharynx clear without erythema or exudate.  Neck: No cervical LAD, no thyromegaly or nodules, no JVD noted. CV: Normal rate, regular rhythm, no murmurs, rubs, or gallops Pulmonary: Normal effort, CTA bilaterally, no wheezing, rales, or rhonchi Abdominal: Soft, non-tender, non-distended, without rebound, guarding, or masses Extremities: Distal pulses 2+ in upper and lower extremities bilaterally, no tenderness, erythema or edema Skin: No atypical appearing moles. No rashes Pelvic: External genitalia appear normal,  without ulcers or lesions. Speculum exam with moderate amount of thin white to yellow vaginal discharge. Vaginal mucosa appears normal, without erythema or lesions noted. She is status post TVH. Bimanual exam normal.  Assessment & Plan:  Please see problem-based charting for assessment and plan.  Blane Ohara, MD Resident Physician, PGY-1 Department of Internal Medicine [facility]

## 2016-04-06 NOTE — Progress Notes (Signed)
Internal Medicine Clinic Attending  Case discussed with Dr. Kennedy at the time of the visit.  We reviewed the resident's history and exam and pertinent patient test results.  I agree with the assessment, diagnosis, and plan of care documented in the resident's note.  

## 2016-04-06 NOTE — Assessment & Plan Note (Signed)
Only complaint patient has today is a 5 day history of yellowish vaginal discharge as well as vaginal itching and irritation. She says that she is having intercourse with her partner 5 days ago, when her condom broke. She had these exact same complaints back in March, where she had both BV and trichomoniasis as well as a yeast infection. Her symptoms were relieved with fluconazole and metronidazole at that time. However, her partner did not get tested or treated for trichomoniasis. This is the same partner she had intercourse with 5 days ago preceding the episode. Speculum exam shows white to yellow vaginal discharge without hyperemia around the external genitalia. I am concerned about recurrent trichomoniasis as well as concomitant BV and/or yeast infections. -Wet prep, urine gonorrhea and chlamydia testing, HIV screen -We'll prescribe metronidazole 2 g by mouth once as well as Diflucan 150 mg once with a second dose 3 days later if symptoms do not resolve -Told patient that this will continue to happen if her partner does not get treated. He still needs to get tested and treated despite him not having symptoms, as symptoms and men are very rare and it is almost certain that he is infected. I did give an additional refill of metronidazole 2 g by mouth today if the partner continues to refuse to see a doctor. -I will inform her of her results when testing comes back

## 2016-04-06 NOTE — Assessment & Plan Note (Signed)
Has been told she has a history in her family of diabetes and high blood pressure. Currently, her blood pressure is good on no medications. She also says that recently she was tested and says that she has pre-diabetes.  -Counseled patient on healthy dietary and exercise practices -We'll consider A1c at next visit in 3 months

## 2016-04-06 NOTE — Patient Instructions (Signed)
Deanna Scott,  Please take the antibiotic (metronidazole) all 4 tablets at once. You do not need to take any more doses after today. I also gave you a pill for yeast (fluconazole), also take 1 pill by mouth today. If your symptoms do not improve in 2-3 days, you can take a second pill pack provided.  Most importantly, your partner needs to him in to our clinic to get tested, otherwise this will continue to happen. Almost all men never have symptoms, even though up to 70% of men in this situation carry the infection and need to be treated with a one-time dose antibiotics. I did provide one refill for you as well of the metronidazole.  I will call you in the next couple of days with your test results.  Thanks, Blane Ohara

## 2016-04-07 ENCOUNTER — Telehealth: Payer: Self-pay | Admitting: Internal Medicine

## 2016-04-07 LAB — URINE CYTOLOGY ANCILLARY ONLY
Chlamydia: NEGATIVE
NEISSERIA GONORRHEA: NEGATIVE

## 2016-04-07 LAB — HIV ANTIBODY (ROUTINE TESTING W REFLEX): HIV SCREEN 4TH GENERATION: NONREACTIVE

## 2016-04-07 LAB — CERVICOVAGINAL ANCILLARY ONLY: WET PREP (BD AFFIRM): POSITIVE — AB

## 2016-04-07 NOTE — Telephone Encounter (Signed)
  Reason for call:   I placed an outgoing call to Ms. Real Cons at 2:25  PM regarding her test results from yesterday's office visit. Her HIV, gonorrhea and chlamydia testing have all come back negative. However, the wet prep has not been done and appears to be canceled in our system. I will continue to follow-up to see if results do come back on the wet prep and notify her of any changes    Assessment/ Plan:   Continue current course of metronidazole and fluconazole  As always, pt is advised that if symptoms worsen or new symptoms arise, they should go to an urgent care facility or to to ER for further evaluation.   Norval Gable, MD   04/07/2016, 2:25 PM

## 2016-04-10 ENCOUNTER — Telehealth: Payer: Self-pay | Admitting: General Practice

## 2016-04-10 NOTE — Telephone Encounter (Signed)
APT. REMINDER CALL, LMTCB °

## 2016-04-11 ENCOUNTER — Ambulatory Visit: Payer: Self-pay

## 2016-04-18 ENCOUNTER — Ambulatory Visit (INDEPENDENT_AMBULATORY_CARE_PROVIDER_SITE_OTHER): Payer: Self-pay | Admitting: Internal Medicine

## 2016-04-18 ENCOUNTER — Encounter: Payer: Self-pay | Admitting: Internal Medicine

## 2016-04-18 VITALS — BP 124/85 | HR 78 | Temp 97.5°F | Ht 62.0 in | Wt 233.7 lb

## 2016-04-18 DIAGNOSIS — N898 Other specified noninflammatory disorders of vagina: Secondary | ICD-10-CM

## 2016-04-18 MED ORDER — FLUCONAZOLE 150 MG PO TABS: ORAL_TABLET | ORAL | Status: DC

## 2016-04-18 MED ORDER — METRONIDAZOLE 500 MG PO TABS
500.0000 mg | ORAL_TABLET | Freq: Two times a day (BID) | ORAL | Status: AC
Start: 2016-04-18 — End: 2016-04-23

## 2016-04-19 NOTE — Progress Notes (Signed)
   Patient ID: Deanna Scott female   DOB: 08/22/1971 45 y.o.   MRN: [id number]  Subjective:   HPI: [name] is a 45 y.o. former smoker with PMH of GERD, recurrent vaginal infections  who presents to [facility] today for Recurrent vaginal discharge and irritation.   Please see problem-based charting for status of medical issues pertinent to this visit.  Review of Systems: Pertinent items noted in HPI and remainder of comprehensive ROS otherwise negative.  Objective:  Physical Exam: Filed Vitals:   04/18/16 1541  BP: 124/85  Pulse: 78  Temp: 97.5 F (36.4 C)  TempSrc: Oral  Height: 5\' 2"  (1.575 m)  Weight: 233 lb 11.2 oz (106.006 kg)  SpO2: 99%   Gen: Well-appearing, alert and oriented to person, place, and time HEENT: Oropharynx clear without erythema or exudate.  Neck: No cervical LAD, no thyromegaly or nodules, no JVD noted. CV: Normal rate, regular rhythm, no murmurs, rubs, or gallops Pulmonary: Normal effort, CTA bilaterally, no wheezing, rales, or rhonchi Abdominal: Soft, non-tender, non-distended, without rebound, guarding, or masses Extremities: Distal pulses 2+ in upper and lower extremities bilaterally, no tenderness, erythema or edema Skin: No atypical appearing moles. No rashes  Assessment & Plan:  Please see problem-based charting for assessment and plan.  Blane Ohara, MD Resident Physician, PGY-1 Department of Internal Medicine [facility]

## 2016-04-19 NOTE — Assessment & Plan Note (Signed)
She says that since last visit 2 weeks ago, the one-time dose of metronidazole on the fluconazole both relieved her symptoms temporarily. However, over the past few days the exact symptoms of come back, including whitish grayish malodorous vaginal discharge as well as vaginal irritation. She denies any other issues such as dysuria, vaginal pain, any visible lesions, or other issues at this time. She denies subjective fevers. She says the symptoms are exactly the same as when I saw her 2 weeks ago. Testing at that time was positive only for Gardnerella, and not for trichomonas like she has previously been positive for. However, as she only received a one-time dose of metronidazole, which would be effective for trichomonas, but would be inadequately treating bacterial vaginosis. This is likely a inadequately treated case of bacterial vaginosis. -Metronidazole 500 mg by mouth twice a day for 5 days -Fluconazole 150 mg by mouth now and then again in 2 days if symptoms persist

## 2016-04-21 NOTE — Progress Notes (Signed)
Internal Medicine Clinic Attending  Case discussed with Dr. Kennedy at the time of the visit.  We reviewed the resident's history and exam and pertinent patient test results.  I agree with the assessment, diagnosis, and plan of care documented in the resident's note.  

## 2016-06-04 ENCOUNTER — Encounter (HOSPITAL_COMMUNITY): Payer: Self-pay | Admitting: Emergency Medicine

## 2016-06-04 ENCOUNTER — Ambulatory Visit (HOSPITAL_COMMUNITY)
Admission: EM | Admit: 2016-06-04 | Discharge: 2016-06-04 | Disposition: A | Discharge status: Home / Self Care | Payer: Self-pay | Attending: Emergency Medicine | Admitting: Emergency Medicine

## 2016-06-04 DIAGNOSIS — Z87891 Personal history of nicotine dependence: Secondary | ICD-10-CM | POA: Insufficient documentation

## 2016-06-04 DIAGNOSIS — A499 Bacterial infection, unspecified: Secondary | ICD-10-CM

## 2016-06-04 DIAGNOSIS — Z7982 Long term (current) use of aspirin: Secondary | ICD-10-CM | POA: Insufficient documentation

## 2016-06-04 DIAGNOSIS — N760 Acute vaginitis: Secondary | ICD-10-CM

## 2016-06-04 DIAGNOSIS — Z9889 Other specified postprocedural states: Secondary | ICD-10-CM | POA: Insufficient documentation

## 2016-06-04 DIAGNOSIS — Z79899 Other long term (current) drug therapy: Secondary | ICD-10-CM | POA: Insufficient documentation

## 2016-06-04 DIAGNOSIS — B9689 Other specified bacterial agents as the cause of diseases classified elsewhere: Secondary | ICD-10-CM

## 2016-06-04 MED ORDER — METRONIDAZOLE 500 MG PO TABS: 500.0000 mg | ORAL_TABLET | Freq: Two times a day (BID) | ORAL | 0 refills | Status: DC

## 2016-06-04 MED ORDER — FLUCONAZOLE 150 MG PO TABS
150.0000 mg | ORAL_TABLET | Freq: Every day | ORAL | 0 refills | Status: DC
Start: 2016-06-04 — End: 2017-07-27

## 2016-06-04 NOTE — ED Provider Notes (Signed)
CSN: [id number]     Arrival date & time 06/04/16  1325 History   None    Chief Complaint  Patient presents with  . Vaginal Discharge   (Consider location/radiation/quality/duration/timing/severity/associated sxs/prior Treatment) Pt reports partner had sex with another person.  She is worried about std   The history is provided by the patient. No language interpreter was used.  Vaginal Discharge  Quality:  [place] Severity:  Mild Onset quality:  Gradual Duration:  3 days Timing:  Constant Progression:  Worsening Chronicity:  New Context: after intercourse   Relieved by:  Nothing Worsened by:  Nothing Ineffective treatments:  None tried Associated symptoms: no dysuria   Risk factors: unprotected sex     Past Medical History:  Diagnosis Date  . Abnormal Pap smear   . Allergy    latex  . GERD (gastroesophageal reflux disease)   . Migraines    otc meds prn  . Plantar fasciitis, bilateral   . Seasonal allergies   . Shortness of breath    with exercise - smoker  . Shoulder pain, right    otc meds   Past Surgical History:  Procedure Laterality Date  . ABDOMINAL HYSTERECTOMY    . BLADDER SUSPENSION  2009  . DIAGNOSTIC LAPAROSCOPY     ectopic pregnancy  . DILATION AND CURETTAGE OF UTERUS     hx mab  . SVD     x 2  . TUBAL LIGATION  1999  . VAGINAL HYSTERECTOMY  03/13/2012   Procedure: HYSTERECTOMY VAGINAL;  Surgeon: Mora Bellman, MD;  Location: [facility];  Service: Gynecology;  Laterality: N/A;   Family History  Problem Relation Age of Onset  . Cancer Father     stomach  . Diabetes Maternal Grandmother    Social History  Substance Use Topics  . Smoking status: Former Smoker    Packs/day: 1.00    Years: 29.00    Types: Cigarettes    Quit date: 09/04/2015  . Smokeless tobacco: Never Used  . Alcohol use 1.0 oz/week    2 Standard drinks or equivalent per week     Comment: socially   OB History    Gravida Para Term Preterm AB Living   4 2 2   2 2    SAB TAB  Ectopic Multiple Live Births   1   1         Review of Systems  Genitourinary: Positive for vaginal discharge. Negative for dysuria.  All other systems reviewed and are negative.   Allergies  Latex  Home Medications   Prior to Admission medications   Medication Sig Start Date End Date Taking? Authorizing Provider  aspirin-acetaminophen-caffeine (EXCEDRIN MIGRAINE) [phone] MG per tablet Take 1 tablet by mouth every 6 (six) hours as needed for pain.    Historical Provider, MD  fluconazole (DIFLUCAN) 150 MG tablet 1 tab po x 1. May repeat in 72 hours if no improvement 04/18/16   Norval Gable, MD  loratadine (CLARITIN) 10 MG tablet Take 10 mg by mouth daily.    Historical Provider, MD  Omega-3 Fatty Acids (OMEGA 3 PO) Take 1 capsule by mouth daily.    Historical Provider, MD  pantoprazole (PROTONIX) 40 MG tablet Take 1 tablet (40 mg total) by mouth daily. 02/17/15   [place], MD  Pseudoephedrine HCl (SUDAFED 24 HOUR PO) Take 1 tablet by mouth daily.    Historical Provider, MD  sodium chloride (OCEAN) 0.65 % SOLN nasal spray Place 2 sprays into both nostrils  as needed for congestion.    Historical Provider, MD   Meds Ordered and Administered this Visit  Medications - No data to display  LMP 03/02/2012  No data found.   Physical Exam  Constitutional: She appears well-developed and well-nourished.  HENT:  Head: Normocephalic and atraumatic.  Eyes: Conjunctivae are normal. Pupils are equal, round, and reactive to light.  Neck: Normal range of motion. Neck supple.  Cardiovascular: Normal rate.   Pulmonary/Chest: Effort normal.  Abdominal: Soft. There is no tenderness.  Genitourinary: Vaginal discharge found.  Genitourinary Comments: Vaginal discharge,  Thick white,  Adnexa no masses,  Cervix nontender  Musculoskeletal: Normal range of motion.  Neurological: She is alert.  Skin: Skin is warm.  Psychiatric: She has a normal mood and affect.    Urgent Care Course    Clinical Course    Procedures (including critical care time)  Labs Review Labs Reviewed - No data to display  Imaging Review No results found.   Visual Acuity Review  Right Eye Distance:   Left Eye Distance:   Bilateral Distance:    Right Eye Near:   Left Eye Near:    Bilateral Near:         MDM   1. BV (bacterial vaginosis)    Flagyl  Diflucan An After Visit Summary was printed and given to the patient.    [place], PA-C 06/04/16 [id number]

## 2016-06-05 LAB — CERVICOVAGINAL ANCILLARY ONLY
Chlamydia: NEGATIVE
NEISSERIA GONORRHEA: NEGATIVE

## 2016-06-06 LAB — CERVICOVAGINAL ANCILLARY ONLY: Wet Prep (BD Affirm): POSITIVE — AB

## 2016-06-10 ENCOUNTER — Telehealth (HOSPITAL_COMMUNITY): Payer: Self-pay | Admitting: Emergency Medicine

## 2016-06-10 NOTE — Telephone Encounter (Signed)
Verified identity.  Notified positive for bv.  Patient is taking medication.  Patient thinks medicine causes her to have a headache and mild nausea.  Encouraged patient to finish medicine.  Reports medicine will be done tomorrow.

## 2016-06-10 NOTE — Telephone Encounter (Signed)
-----   Message from Sherlene Shams, MD sent at 06/08/2016  8:46 AM EDT ----- Please let patient know that test for gardnerella (bacterial vaginosis) was positive.  Rx for metronidazole was given at [facility] visit 06/04/16.  Recheck or followup with PCP, Bethany Molt, if symptoms persist.  LM

## 2016-06-13 NOTE — Progress Notes (Signed)
06/10/2016 11:09 am.  Spoke to patient, patient made aware of lab results and patient will finish medication today.  Discussed patient's complaints for headache and nausea. klh

## 2016-10-03 ENCOUNTER — Encounter (HOSPITAL_COMMUNITY): Payer: Self-pay | Admitting: Emergency Medicine

## 2016-10-03 ENCOUNTER — Ambulatory Visit (HOSPITAL_COMMUNITY)
Admission: EM | Admit: 2016-10-03 | Discharge: 2016-10-03 | Disposition: A | Discharge status: Home / Self Care | Payer: Self-pay | Attending: Family Medicine | Admitting: Family Medicine

## 2016-10-03 DIAGNOSIS — Z202 Contact with and (suspected) exposure to infections with a predominantly sexual mode of transmission: Secondary | ICD-10-CM | POA: Insufficient documentation

## 2016-10-03 DIAGNOSIS — N898 Other specified noninflammatory disorders of vagina: Secondary | ICD-10-CM | POA: Insufficient documentation

## 2016-10-03 DIAGNOSIS — Z87891 Personal history of nicotine dependence: Secondary | ICD-10-CM | POA: Insufficient documentation

## 2016-10-03 DIAGNOSIS — K219 Gastro-esophageal reflux disease without esophagitis: Secondary | ICD-10-CM | POA: Insufficient documentation

## 2016-10-03 DIAGNOSIS — K259 Gastric ulcer, unspecified as acute or chronic, without hemorrhage or perforation: Secondary | ICD-10-CM | POA: Insufficient documentation

## 2016-10-03 NOTE — Discharge Instructions (Signed)
We will call with positive test results and treat as indicated  °

## 2016-10-03 NOTE — ED Triage Notes (Signed)
The patient presented to the [facility] with a complaint of a vaginal discharge x 1 week.

## 2016-10-03 NOTE — ED Provider Notes (Signed)
[place]    CSN: [id number] Arrival date & time: 10/03/16  1721     History   Chief Complaint No chief complaint on file.   HPI Deanna Scott is a 45 y.o. female.   The history is provided by the patient.  Female GU Problem  This is a recurrent problem. The current episode started more than 2 days ago (h/o bv but wants std check because not sure.). The problem has not changed since onset.Pertinent negatives include no chest pain and no abdominal pain.    Past Medical History:  Diagnosis Date  . Abnormal Pap smear   . Allergy    latex  . GERD (gastroesophageal reflux disease)   . Migraines    otc meds prn  . Plantar fasciitis, bilateral   . Seasonal allergies   . Shortness of breath    with exercise - smoker  . Shoulder pain, right    otc meds    Patient Active Problem List   Diagnosis Date Noted  . GERD (gastroesophageal reflux disease) 07/29/2014  . Vaginal discharge 01/16/2014  . Exposure to sexually transmitted disease (STD) 02/19/2012  . Impingement syndrome of right shoulder 02/23/2011  . PLANTAR FASCIITIS, BILATERAL 11/17/2010  . TALIPES CAVUS 11/17/2010  . OBESITY, NOS 01/03/2007  . TOBACCO DEPENDENCE 01/03/2007  . RHINITIS, ALLERGIC 01/03/2007    Past Surgical History:  Procedure Laterality Date  . ABDOMINAL HYSTERECTOMY    . BLADDER SUSPENSION  2009  . DIAGNOSTIC LAPAROSCOPY     ectopic pregnancy  . DILATION AND CURETTAGE OF UTERUS     hx mab  . SVD     x 2  . TUBAL LIGATION  1999  . VAGINAL HYSTERECTOMY  03/13/2012   Procedure: HYSTERECTOMY VAGINAL;  Surgeon: Mora Bellman, MD;  Location: [facility];  Service: Gynecology;  Laterality: N/A;    OB History    Gravida Para Term Preterm AB Living   4 2 2   2 2    SAB TAB Ectopic Multiple Live Births   1   1           Home Medications    Prior to Admission medications   Medication Sig Start Date End Date Taking? Authorizing Provider  aspirin-acetaminophen-caffeine (EXCEDRIN  MIGRAINE) [id number] MG per tablet Take 1 tablet by mouth every 6 (six) hours as needed for pain.    Historical Provider, MD  fluconazole (DIFLUCAN) 150 MG tablet Take 1 tablet (150 mg total) by mouth daily. 06/04/16   Fransico Meadow, PA-C  loratadine (CLARITIN) 10 MG tablet Take 10 mg by mouth daily.    Historical Provider, MD  metroNIDAZOLE (FLAGYL) 500 MG tablet Take 1 tablet (500 mg total) by mouth 2 (two) times daily. 06/04/16   Fransico Meadow, PA-C  Omega-3 Fatty Acids (OMEGA 3 PO) Take 1 capsule by mouth daily.    Historical Provider, MD  pantoprazole (PROTONIX) 40 MG tablet Take 1 tablet (40 mg total) by mouth daily. 02/17/15   Ambrose, MD  Pseudoephedrine HCl (SUDAFED 24 HOUR PO) Take 1 tablet by mouth daily.    Historical Provider, MD  sodium chloride (OCEAN) 0.65 % SOLN nasal spray Place 2 sprays into both nostrils as needed for congestion.    Historical Provider, MD    Family History Family History  Problem Relation Age of Onset  . Cancer Father     stomach  . Diabetes Maternal Grandmother     Social History Social History  Substance Use Topics  .  Smoking status: Former Smoker    Packs/day: 1.00    Years: 29.00    Types: Cigarettes    Quit date: 09/04/2015  . Smokeless tobacco: Never Used  . Alcohol use 1.0 oz/week    2 Standard drinks or equivalent per week     Comment: socially     Allergies   Latex   Review of Systems Review of Systems  Cardiovascular: Negative for chest pain.  Gastrointestinal: Negative.  Negative for abdominal pain.  Genitourinary: Positive for vaginal discharge. Negative for dysuria, frequency, pelvic pain, urgency, vaginal bleeding and vaginal pain.  All other systems reviewed and are negative.    Physical Exam Triage Vital Signs ED Triage Vitals  Enc Vitals Group     BP      Pulse      Resp      Temp      Temp src      SpO2      Weight      Height      Head Circumference      Peak Flow      Pain Score       Pain Loc      Pain Edu?      Excl. in [place]?    No data found.   Updated Vital Signs LMP 03/02/2012   Visual Acuity Right Eye Distance:   Left Eye Distance:   Bilateral Distance:    Right Eye Near:   Left Eye Near:    Bilateral Near:     Physical Exam  Constitutional: She is oriented to person, place, and time. She appears well-developed and well-nourished. No distress.  Abdominal: Soft. Bowel sounds are normal. She exhibits no mass. There is no tenderness. There is no guarding.  Neurological: She is alert and oriented to person, place, and time.  Skin: Skin is warm and dry.  Nursing note and vitals reviewed.    UC Treatments / Results  Labs (all labs ordered are listed, but only abnormal results are displayed) Labs Reviewed - No data to display  EKG  EKG Interpretation None       Radiology No results found.  Procedures Procedures (including critical care time)  Medications Ordered in UC Medications - No data to display   Initial Impression / Assessment and Plan / UC Course  I have reviewed the triage vital signs and the nursing notes.  Pertinent labs & imaging results that were available during my care of the patient were reviewed by me and considered in my medical decision making (see chart for details).  Clinical Course       Final Clinical Impressions(s) / UC Diagnoses   Final diagnoses:  None    New Prescriptions New Prescriptions   No medications on file     Billy Fischer, MD 10/03/16 [phone]

## 2016-10-04 LAB — URINE CYTOLOGY ANCILLARY ONLY
CHLAMYDIA, DNA PROBE: NEGATIVE
NEISSERIA GONORRHEA: NEGATIVE
TRICH (WINDOWPATH): NEGATIVE

## 2016-10-04 LAB — HIV ANTIBODY (ROUTINE TESTING W REFLEX): HIV SCREEN 4TH GENERATION: NONREACTIVE

## 2016-10-04 LAB — RPR: RPR: NONREACTIVE

## 2016-10-06 LAB — URINE CYTOLOGY ANCILLARY ONLY: CANDIDA VAGINITIS: NEGATIVE

## 2016-10-08 ENCOUNTER — Telehealth (HOSPITAL_COMMUNITY): Payer: Self-pay | Admitting: Internal Medicine

## 2016-10-08 MED ORDER — METRONIDAZOLE 500 MG PO TABS: 500.0000 mg | ORAL_TABLET | Freq: Two times a day (BID) | ORAL | 0 refills | Status: DC

## 2016-10-08 NOTE — Telephone Encounter (Signed)
Clinical staff, please let patient know that test for gardnerella (bacterial vaginosis) was positive.  Rx metronidazole sent to pharmacy of record, [facility] at [address] and [place].  Recheck for further evaluation if symptoms persist.  LM

## 2016-10-10 ENCOUNTER — Telehealth (HOSPITAL_COMMUNITY): Payer: Self-pay | Admitting: Emergency Medicine

## 2016-10-10 NOTE — Telephone Encounter (Signed)
-----   Message from Sherlene Shams, MD sent at 10/08/2016  7:48 PM EST ----- Clinical staff, please let patient know that test for gardnerella (bacterial vaginosis) was positive.  Rx metronidazole sent to pharmacy of record, [facility] at [address] and [place].  Recheck for further evaluation if symptoms persist.  LM

## 2016-10-10 NOTE — Telephone Encounter (Signed)
Called pt and notified of recent lab results  Pt ID'd properly... Reports feeling better but still having some mild d/c Notified of pending Rx  Adv pt if sx are not getting better to return or to f/u w/PCP Pt verb understanding.

## 2016-10-19 ENCOUNTER — Ambulatory Visit: Payer: Self-pay

## 2016-11-01 ENCOUNTER — Telehealth: Payer: Self-pay | Admitting: General Practice

## 2016-11-01 NOTE — Telephone Encounter (Signed)
PT CALLED RECEIVED HER NEW [facility] CARD IN MAIL, WANTED TO KNOW WHEN SHE WOULD RECEIVED HER DISCOUNT LETTER. ADVISED PT THAT PT ACCT WOULD HAVE TO MAIL HER LETTER B/C HER INCOME WAS OVER 100% [employer].

## 2017-04-25 ENCOUNTER — Ambulatory Visit: Payer: Self-pay

## 2017-07-12 ENCOUNTER — Encounter (HOSPITAL_COMMUNITY): Payer: Self-pay | Admitting: Family Medicine

## 2017-07-12 ENCOUNTER — Ambulatory Visit (HOSPITAL_COMMUNITY)
Admission: EM | Admit: 2017-07-12 | Discharge: 2017-07-12 | Disposition: A | Discharge status: Home / Self Care | Payer: Self-pay | Attending: Internal Medicine | Admitting: Internal Medicine

## 2017-07-12 DIAGNOSIS — M791 Myalgia: Secondary | ICD-10-CM

## 2017-07-12 DIAGNOSIS — J0100 Acute maxillary sinusitis, unspecified: Secondary | ICD-10-CM

## 2017-07-12 DIAGNOSIS — J069 Acute upper respiratory infection, unspecified: Secondary | ICD-10-CM

## 2017-07-12 DIAGNOSIS — M7918 Myalgia, other site: Secondary | ICD-10-CM

## 2017-07-12 MED ORDER — PREDNISONE 50 MG PO TABS: ORAL_TABLET | ORAL | 0 refills | Status: DC

## 2017-07-12 NOTE — ED Provider Notes (Signed)
[place]    CSN: [id number] Arrival date & time: 07/12/17  1532     History   Chief Complaint Chief Complaint  Patient presents with  . Nasal Congestion  . Spasms    HPI Deanna Scott is a 46 y.o. female.   46 year old female has 2 complaints. One is that of sinus pressure, sinus congestion, PND with associated bitemporal headache and teeth hurting for one week. She has no earache or sore throat.  Her second complaint is that of bilateral left greater than right shoulder pain for several years. She believes that she has arthritis. She points to the trapezius muscle and demonstrates how the muscle clicks when she shrugs her shoulder. She is afebrile.      Past Medical History:  Diagnosis Date  . Abnormal Pap smear   . Allergy    latex  . GERD (gastroesophageal reflux disease)   . Migraines    otc meds prn  . Plantar fasciitis, bilateral   . Seasonal allergies   . Shortness of breath    with exercise - smoker  . Shoulder pain, right    otc meds    Patient Active Problem List   Diagnosis Date Noted  . GERD (gastroesophageal reflux disease) 07/29/2014  . Vaginal discharge 01/16/2014  . Exposure to sexually transmitted disease (STD) 02/19/2012  . Impingement syndrome of right shoulder 02/23/2011  . PLANTAR FASCIITIS, BILATERAL 11/17/2010  . TALIPES CAVUS 11/17/2010  . OBESITY, NOS 01/03/2007  . TOBACCO DEPENDENCE 01/03/2007  . RHINITIS, ALLERGIC 01/03/2007    Past Surgical History:  Procedure Laterality Date  . ABDOMINAL HYSTERECTOMY    . BLADDER SUSPENSION  2009  . DIAGNOSTIC LAPAROSCOPY     ectopic pregnancy  . DILATION AND CURETTAGE OF UTERUS     hx mab  . SVD     x 2  . TUBAL LIGATION  1999  . VAGINAL HYSTERECTOMY  03/13/2012   Procedure: HYSTERECTOMY VAGINAL;  Surgeon: Mora Bellman, MD;  Location: [facility];  Service: Gynecology;  Laterality: N/A;    OB History    Gravida Para Term Preterm AB Living   4 2 2   2 2    SAB TAB Ectopic  Multiple Live Births   1   1           Home Medications    Prior to Admission medications   Medication Sig Start Date End Date Taking? Authorizing Provider  aspirin-acetaminophen-caffeine (EXCEDRIN MIGRAINE) [phone] MG per tablet Take 1 tablet by mouth every 6 (six) hours as needed for pain.    [provider]  fluconazole (DIFLUCAN) 150 MG tablet Take 1 tablet (150 mg total) by mouth daily. 06/04/16   Fransico Meadow, PA-C  loratadine (CLARITIN) 10 MG tablet Take 10 mg by mouth daily.    [provider]  Omega-3 Fatty Acids (OMEGA 3 PO) Take 1 capsule by mouth daily.    [provider]  pantoprazole (PROTONIX) 40 MG tablet Take 1 tablet (40 mg total) by mouth daily. 02/17/15   Street, Sharon Mt, MD  predniSONE (DELTASONE) 50 MG tablet 1 tab po daily for 6 days. Take with food. 07/12/17   Janne Napoleon, NP  Pseudoephedrine HCl (SUDAFED 24 HOUR PO) Take 1 tablet by mouth daily.    [provider]  sodium chloride (OCEAN) 0.65 % SOLN nasal spray Place 2 sprays into both nostrils as needed for congestion.    [provider]    Family History Family History  Problem Relation Age of Onset  . Cancer Father        stomach  . Diabetes Maternal Grandmother     Social History Social History  Substance Use Topics  . Smoking status: Former Smoker    Packs/day: 1.00    Years: 29.00    Types: Cigarettes    Quit date: 09/04/2015  . Smokeless tobacco: Never Used  . Alcohol use 1.0 oz/week    2 Standard drinks or equivalent per week     Comment: socially     Allergies   Latex   Review of Systems Review of Systems  Constitutional: Positive for activity change. Negative for appetite change, chills, fatigue and fever.  HENT: Positive for congestion, postnasal drip and rhinorrhea. Negative for facial swelling.   Eyes: Negative.   Respiratory: Negative.   Cardiovascular: Negative.   Genitourinary: Negative.   Musculoskeletal: Positive for  myalgias. Negative for neck pain and neck stiffness.       As per HPI  Skin: Negative for color change, pallor and rash.  Neurological: Negative.   All other systems reviewed and are negative.    Physical Exam Triage Vital Signs ED Triage Vitals [07/12/17 1614]  Enc Vitals Group     BP (!) 130/93     Pulse Rate 75     Resp 18     Temp 98.4 F (36.9 C)     Temp src      SpO2 97 %     Weight      Height      Head Circumference      Peak Flow      Pain Score 7     Pain Loc      Pain Edu?      Excl. in [place]?    No data found.   Updated Vital Signs BP (!) 130/93   Pulse 75   Temp 98.4 F (36.9 C)   Resp 18   LMP 03/02/2012   SpO2 97%   Visual Acuity Right Eye Distance:   Left Eye Distance:   Bilateral Distance:    Right Eye Near:   Left Eye Near:    Bilateral Near:     Physical Exam  Constitutional: She is oriented to person, place, and time. She appears well-developed and well-nourished. No distress.  HENT:  Head: Normocephalic and atraumatic.  Bilateral TMs are normal. Oropharynx with minor erythema and small amount of thick PND.  Eyes: EOM are normal.  Neck: Normal range of motion. Neck supple.  Cardiovascular: Normal rate, regular rhythm, normal heart sounds and intact distal pulses.   Pulmonary/Chest: Breath sounds normal. No respiratory distress. She has no wheezes. She has no rales.  Musculoskeletal:  The pain of her shoulders originate along the ridge and paracervical muscles, primarily the trapezius. Trapezius muscle is exquisitely tender. It is worse when shrugging her shoulders. When she shrugs the left shoulder there is a popping sound. She states she has had this for several years with periodic flareups. 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 There is no tenderness to the cervical or thoracic spine. No tenderness to the shoulder joint.  Lymphadenopathy:    She has no cervical adenopathy.    Neurological: She is alert and oriented to person, place, and time. No cranial nerve deficit.  Skin: Skin is warm and dry.  Psychiatric: She has a normal mood and affect.     UC Treatments / Results  Labs (all labs ordered are listed, but only abnormal results are displayed) Labs Reviewed - No data to display  EKG  EKG Interpretation None       Radiology No results found.  Procedures Procedures (including critical care time)  Medications Ordered in UC Medications - No data to display   Initial Impression / Assessment and Plan / UC Course  I have reviewed the triage vital signs and the nursing notes.  Pertinent labs & imaging results that were available during my care of the patient were reviewed by me and considered in my medical decision making (see chart for details).     Continue taking the decongestant and antihistamine. Add saline nasal spray. For your shoulder  may apply Salonpas Menthol patch to shoulders , apply heat over the muscles. Keep your shoulders down while working. Take the prednisone as directed and take with food.   Final Clinical Impressions(s) / UC Diagnoses   Final diagnoses:  Viral upper respiratory tract infection  Acute non-recurrent maxillary sinusitis  Bilateral myofascial pain    New Prescriptions New Prescriptions   PREDNISONE (DELTASONE) 50 MG TABLET    1 tab po daily for 6 days. Take with food.     Controlled Substance Prescriptions [place] Controlled Substance Registry consulted? Not Applicable   Janne Napoleon, NP 07/12/17 1704

## 2017-07-12 NOTE — Discharge Instructions (Signed)
Continue taking the decongestant and antihistamine. Add saline nasal spray. For your shoulder  may apply Salonpas Menthol patch to shoulders , apply heat over the muscles. Keep your shoulders down while working. Take the prednisone as directed and take with food.

## 2017-07-12 NOTE — ED Triage Notes (Signed)
Pt here for sinus congestion x 1 week and muscle spasms in her shoulders.

## 2017-07-27 ENCOUNTER — Ambulatory Visit (INDEPENDENT_AMBULATORY_CARE_PROVIDER_SITE_OTHER): Payer: Self-pay | Admitting: Family Medicine

## 2017-07-27 ENCOUNTER — Encounter: Payer: Self-pay | Admitting: Family Medicine

## 2017-07-27 VITALS — BP 120/80 | HR 92 | Temp 98.2°F | Wt 222.0 lb

## 2017-07-27 DIAGNOSIS — G43909 Migraine, unspecified, not intractable, without status migrainosus: Secondary | ICD-10-CM | POA: Insufficient documentation

## 2017-07-27 DIAGNOSIS — IMO0001 Reserved for inherently not codable concepts without codable children: Secondary | ICD-10-CM

## 2017-07-27 DIAGNOSIS — Z6841 Body Mass Index (BMI) 40.0 and over, adult: Secondary | ICD-10-CM

## 2017-07-27 DIAGNOSIS — N8110 Cystocele, unspecified: Secondary | ICD-10-CM

## 2017-07-27 DIAGNOSIS — E669 Obesity, unspecified: Secondary | ICD-10-CM

## 2017-07-27 NOTE — Patient Instructions (Signed)
It was good to see you today!  For your vaginal wall prolapse, I have placed a referral to OBGYN, since you have no insurance you may need to pay out of pocket for this. Please let us know if you have not heard back in 2 weeks about this.  I recommend that we do baseline labs, please call back and make a lab visit appointment for this. Since we are checking cholesterol, you should be fasting 8 hours before. If results require attention, either myself or my nurse will get in touch with you. If everything is normal, you will get a letter in the mail or a message in My Chart. Please give [place] a call if you do not hear from [place] after 2 weeks after getting bloodwork drawn. You can wait until you  Have the orange card  Call back and make an appointment with Kennyth Lose to start the orange card process.  Please check-out at the front desk before leaving the clinic. Make an appointment to see me after you get the orange card for 6 months, whichever is sooner.   Please bring all of your medications with you to each visit.   Sign up for My Chart to have easy access to your labs results, and communication with your primary care physician.  Feel free to call with any questions or concerns at any time, at [phone].   Take care,  Dr. Bufford Lope, [place]

## 2017-07-27 NOTE — Progress Notes (Signed)
Subjective:  Deanna Scott is a 46 y.o. female who presents to the [facility] today to establish care  HPI:  Previously seen by [facility] remotely in the past, recent seen by internal medical residency clinic but wanted to return here for her care.  Bulge in vaginal area - Had hysterectomy in 2014, was seen for "things falling out" after surgery a few years ago  - lately has noticed more vaginal pressure and can feel a bulge when standing - having more difficulty passing stool that she used to - no bleeding or discharge - no urinary symptoms    ROS: per HPI, otherwise a 14 point review of systems was performed and was negative  PMH:  The following were reviewed and entered/updated in epic: Past Medical History:  Diagnosis Date  . Abnormal Pap smear   . Allergy    latex  . GERD (gastroesophageal reflux disease)   . Migraines    otc meds prn  . Plantar fasciitis, bilateral   . Seasonal allergies   . Shortness of breath    with exercise - smoker  . Shoulder pain, right    otc meds   Patient Active Problem List   Diagnosis Date Noted  . Migraines 07/27/2017  . GERD (gastroesophageal reflux disease) 07/29/2014  . Impingement syndrome of right shoulder 02/23/2011  . PLANTAR FASCIITIS, BILATERAL 11/17/2010  . TALIPES CAVUS 11/17/2010  . OBESITY, NOS 01/03/2007   Past Surgical History:  Procedure Laterality Date  . ABDOMINAL HYSTERECTOMY    . BLADDER SUSPENSION  2009  . DIAGNOSTIC LAPAROSCOPY     ectopic pregnancy  . DILATION AND CURETTAGE OF UTERUS     hx mab  . SVD     x 2  . TUBAL LIGATION  1999  . VAGINAL HYSTERECTOMY  03/13/2012   Procedure: HYSTERECTOMY VAGINAL;  Surgeon: Mora Bellman, MD;  Location: [facility];  Service: Gynecology;  Laterality: N/A;    Family History  Problem Relation Age of Onset  . Cancer Father        stomach  . Diabetes Maternal Grandmother   . Other Mother        balance issues from a "thing in her brain" requires walker    Medications-  reviewed and updated Current Outpatient Prescriptions  Medication Sig Dispense Refill  . Cyanocobalamin (VITAMIN B12 PO) Take 1 each by mouth daily.    . Flaxseed, Linseed, (FLAX SEED OIL PO) Take 1 each by mouth daily.    Marland Kitchen omeprazole (PRILOSEC OTC) 20 MG tablet Take 20 mg by mouth daily.    Marland Kitchen aspirin-acetaminophen-caffeine (EXCEDRIN MIGRAINE) 250-250-65 MG per tablet Take 1 tablet by mouth every 6 (six) hours as needed for pain.    Marland Kitchen loratadine (CLARITIN) 10 MG tablet Take 10 mg by mouth daily.    . Pseudoephedrine HCl (SUDAFED 24 HOUR PO) Take 1 tablet by mouth daily.    . sodium chloride (OCEAN) 0.65 % SOLN nasal spray Place 2 sprays into both nostrils as needed for congestion.     No current facility-administered medications for this visit.     Allergies-reviewed and updated Allergies  Allergen Reactions  . Latex Itching and Dermatitis    Social History   Social History  . Marital status: Divorced    Spouse name: N/A  . Number of children: N/A  . Years of education: N/A   Social History Main Topics  . Smoking status: Former Smoker    Packs/day: 1.00    Years: 34.00  Types: Cigarettes    Quit date: 09/04/2015  . Smokeless tobacco: Never Used  . Alcohol use 1.0 oz/week    2 Standard drinks or equivalent per week     Comment: socially, 3x per week  . Drug use: No  . Sexual activity: Not Currently    Birth control/ protection: None, Surgical   Other Topics Concern  . None   Social History Narrative   Lives at home with 2 dogs and 1 cat    Objective:  Physical Exam: BP 120/80   Pulse 92   Temp 98.2 F (36.8 C) (Oral)   Wt 222 lb (100.7 kg)   LMP 03/02/2012   SpO2 98%   BMI 40.60 kg/m   Gen: NAD, resting comfortably CV: RRR with no murmurs appreciated Pulm: NWOB, CTAB with no crackles, wheezes, or rhonchi GI: Normal bowel sounds present. Soft, Nontender, Nondistended. GU: normal appearing external female genitalia. Bulging vaginal wall on posterior  side that was more prominent on valsalva. MSK: no edema, cyanosis, or clubbing noted Skin: warm, dry Neuro: grossly normal, moves all extremities Psych: Normal affect and thought content   Assessment/Plan:  Vaginal wall prolapse Prominent vaginal wall prolapse on posterior side on exam today. Likely rectocele given per chart review, had been evaluated for this in 2013. However with worsening symptoms including vaginal pressure and more discomfort with passing stools, refer to OBGYN for reevaluation. However since she is able to have bowel movements and no red flags on history or on exam today, do not believe this to be an urgent issue. Given patient does not have insurance, discussed that she may need to pay out of pocket if she wants to be seen soon. She requested that I place the referral anyways today. Advised patient to make an appointment to start orange card process.  OBESITY, NOS Will get baseline labs today including a fasting lipid panel. Patient to call and make lab appointment for this.   Bufford Lope, DO PGY-2, [facility] 07/27/2017 5:07 PM

## 2017-07-29 DIAGNOSIS — N8110 Cystocele, unspecified: Secondary | ICD-10-CM | POA: Insufficient documentation

## 2017-07-29 NOTE — Assessment & Plan Note (Signed)
Prominent vaginal wall prolapse on posterior side on exam today. Likely rectocele given per chart review, had been evaluated for this in 2013. However with worsening symptoms including vaginal pressure and more discomfort with passing stools, refer to OBGYN for reevaluation. However since she is able to have bowel movements and no red flags on history or on exam today, do not believe this to be an urgent issue. Given patient does not have insurance, discussed that she may need to pay out of pocket if she wants to be seen soon. She requested that I place the referral anyways today. Advised patient to make an appointment to start orange card process.

## 2017-07-29 NOTE — Assessment & Plan Note (Signed)
Will get baseline labs today including a fasting lipid panel. Patient to call and make lab appointment for this.

## 2017-09-12 ENCOUNTER — Ambulatory Visit: Payer: Self-pay

## 2017-10-23 ENCOUNTER — Telehealth: Payer: Self-pay | Admitting: Family Medicine

## 2017-10-23 NOTE — Telephone Encounter (Signed)
Will forward to referral coordinator to check on this referral. Jazmin Hartsell,CMA

## 2017-10-23 NOTE — Telephone Encounter (Signed)
Pt was referred to [facility] in Sept 2018 but never received anything from them about an appt.  She is now in pain and needs to be seen.  She is still waiting for the orange card. Please advise

## 2017-11-14 ENCOUNTER — Ambulatory Visit: Payer: Self-pay

## 2018-06-11 ENCOUNTER — Encounter

## 2018-07-05 ENCOUNTER — Encounter: Payer: Self-pay | Admitting: Family Medicine

## 2018-07-05 ENCOUNTER — Other Ambulatory Visit (HOSPITAL_COMMUNITY)
Admission: RE | Admit: 2018-07-05 | Discharge: 2018-07-05 | Disposition: A | Discharge status: Home / Self Care | Payer: Self-pay | Source: Ambulatory Visit | Attending: Family Medicine | Admitting: Family Medicine

## 2018-07-05 ENCOUNTER — Ambulatory Visit (INDEPENDENT_AMBULATORY_CARE_PROVIDER_SITE_OTHER): Payer: Self-pay | Admitting: Family Medicine

## 2018-07-05 ENCOUNTER — Other Ambulatory Visit: Payer: Self-pay

## 2018-07-05 VITALS — BP 108/72 | HR 72 | Temp 97.8°F | Ht 62.0 in | Wt 207.0 lb

## 2018-07-05 DIAGNOSIS — N760 Acute vaginitis: Secondary | ICD-10-CM

## 2018-07-05 DIAGNOSIS — B9689 Other specified bacterial agents as the cause of diseases classified elsewhere: Secondary | ICD-10-CM | POA: Insufficient documentation

## 2018-07-05 DIAGNOSIS — N898 Other specified noninflammatory disorders of vagina: Secondary | ICD-10-CM

## 2018-07-05 LAB — POCT WET PREP (WET MOUNT)
Clue Cells Wet Prep Whiff POC: NEGATIVE
TRICHOMONAS WET PREP HPF POC: ABSENT

## 2018-07-05 MED ORDER — METRONIDAZOLE 250 MG PO TABS: 750.0000 mg | ORAL_TABLET | Freq: Every day | ORAL | 0 refills | Status: AC

## 2018-07-05 MED ORDER — FLUCONAZOLE 150 MG PO TABS: 150.0000 mg | ORAL_TABLET | Freq: Once | ORAL | 0 refills | Status: AC

## 2018-07-05 NOTE — Assessment & Plan Note (Signed)
BV on wet prep. Other vaginal labs pending. Will tx with flagyl. Pt request diflucan in case of vaginal candidiasis, will send 1 time dose.

## 2018-07-05 NOTE — Patient Instructions (Signed)
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
## 2018-07-06 LAB — HIV ANTIBODY (ROUTINE TESTING W REFLEX): HIV Screen 4th Generation wRfx: NONREACTIVE

## 2018-07-06 LAB — RPR: RPR Ser Ql: NONREACTIVE

## 2018-07-09 ENCOUNTER — Encounter: Payer: Self-pay | Admitting: Family Medicine

## 2018-07-09 LAB — CERVICOVAGINAL ANCILLARY ONLY
CHLAMYDIA, DNA PROBE: NEGATIVE
NEISSERIA GONORRHEA: NEGATIVE

## 2018-07-16 ENCOUNTER — Telehealth: Payer: Self-pay

## 2018-07-16 DIAGNOSIS — B9689 Other specified bacterial agents as the cause of diseases classified elsewhere: Secondary | ICD-10-CM

## 2018-07-16 DIAGNOSIS — N760 Acute vaginitis: Principal | ICD-10-CM

## 2018-07-16 MED ORDER — METRONIDAZOLE 0.75 % VA GEL: 1.0000 | Freq: Two times a day (BID) | VAGINAL | 0 refills | Status: DC

## 2018-07-16 NOTE — Telephone Encounter (Signed)
Rx for Metro gel. Pls call pt and inform. Thx.

## 2018-07-16 NOTE — Telephone Encounter (Signed)
Will forward to provider that assessed this patient

## 2018-07-16 NOTE — Telephone Encounter (Signed)
Pt was seen on 8/30 with Dr. Grandville Silos and dx'd with BV, given metronidazole tabs. Pt states she was only able to take for 3 days and had to d/c due to GI Upset. Calling back requesting a different Rx sent to [facility]. Call back [phone] Wallace Cullens, RN

## 2018-07-22 ENCOUNTER — Telehealth: Payer: Self-pay

## 2018-07-22 NOTE — Telephone Encounter (Signed)
Called patient to discuss her request. Patient requesting a cheaper alternative to flagyl and a refill on diflucan. Patient states that she is still having vaginal discharge and itching. Recommended repeat wet prep as she has now been treated with both flagyl (partially) and diflucan; which means that now unclear as she could either have BV or vaginal candidiasis and would need to determine this. Patient very frustrated as she does not have insurance and it would be more expensive for her to come in for appointment. She is requesting empiric treatment as she felt that she had been properly diagnosed on 07/05/18. Discussed that since that was a couple of weeks ago, would not be calling in additional medication, much less both of the medications that she would like, without repeat wet prep testing. Patient upset and stated that she would rather go pick up the metrogel.

## 2018-07-22 NOTE — Telephone Encounter (Signed)
Pt called nurse line requesting a new rx be sent in for BV. Pt stated the metro gel is too expensive. Pt would also like another diflucan called in. Please advise.

## 2018-07-23 NOTE — Telephone Encounter (Signed)
Thanks for the update

## 2018-09-26 ENCOUNTER — Other Ambulatory Visit: Payer: Self-pay

## 2018-09-26 ENCOUNTER — Ambulatory Visit (INDEPENDENT_AMBULATORY_CARE_PROVIDER_SITE_OTHER): Payer: Self-pay | Admitting: Student in an Organized Health Care Education/Training Program

## 2018-09-26 VITALS — BP 122/64 | HR 87 | Temp 97.5°F | Ht 62.0 in | Wt 205.6 lb

## 2018-09-26 DIAGNOSIS — M62830 Muscle spasm of back: Secondary | ICD-10-CM

## 2018-09-26 DIAGNOSIS — R200 Anesthesia of skin: Secondary | ICD-10-CM

## 2018-09-26 DIAGNOSIS — R202 Paresthesia of skin: Secondary | ICD-10-CM

## 2018-09-26 DIAGNOSIS — R609 Edema, unspecified: Secondary | ICD-10-CM

## 2018-09-26 MED ORDER — CYCLOBENZAPRINE HCL 10 MG PO TABS: 10.0000 mg | ORAL_TABLET | Freq: Every evening | ORAL | 0 refills | Status: DC | PRN

## 2018-09-26 NOTE — Patient Instructions (Signed)
It was a pleasure seeing you today in our clinic.  We drew blood work at today's visit. I will call or send you a letter with these results. If you do not hear from me within the next week, please give our office a call.  Our clinic's number is [phone]. Please call with questions or concerns about what we discussed today.  Be well, Dr. Burr Medico  Sign up for My Chart to have easy access to your labs results, and communication with your primary care physician.

## 2018-09-26 NOTE — Progress Notes (Signed)
   CC: back pain, hand numbness  HPI: Deanna Scott is a 47 y.o. female with PMH significant for migraines who presents to [facility] today with numerous complaints.  Left rhomboid pain - patient endorses 2 days of L rhomboid pain. Pain does not radiate. It is worse with movement. She has not had any weakness, numbness, or loss of sensation. She denies fevers, urinary or fecal incontinence, or night sweats.   Bilateral hand numbness - 1-2 days duration. Hands "fall asleep" and then come back. There is no specific distribution. Tingling is not worse at night. She may have had this prior to two days ago. She states her main concern is that this may be a sign of diabetes.  LE edema - bilateral. Patient is a Scientist, water quality, she is on her feet all day. She has not tried any intervention or appreciated any palliating factors. No hx of blood clots. No recent travel. No calf pain. No dyspnea or palpitations. No fevers.  Review of Symptoms:  See HPI for ROS.   CC, SH/smoking status, and VS noted.  Objective: LMP 03/02/2012  GEN: NAD, alert, cooperative, and pleasant. RESPIRATORY: clear to auscultation bilaterally with no wheezes, rhonchi or rales, good effort CV: RRR, no m/r/g, no peripheral edema GI: soft, non-tender, non-distended, no hepatosplenomegaly SKIN: warm and dry, no rashes or lesions EXT: bilateral LE edema 1+. Neg homan sign.  NEURO: II-XII grossly intact, negative tinel and phalen. Sensation intact over distal hands and fingers bilaterally. Palpable distal pulses equal bilaterally PSYCH: AAOx3, appropriate affect   Assessment and plan: 1. Numbness and tingling in both hands - physical exam is not consistent with carpal tunnel, though this is a possibility given short duration. Patient is most concerned with diabetes, and although this would be an unusual presentation of diabetic neuropathy we will check BMP for glucose and elctrolytes. CBC for hgb. Patient already takes a multivitamin. - CBC -  Basic metabolic panel - follow up if symptoms worsen or become more bothersome for evaluation, could consider night splinting  2. Dependent Edema, bilateral LE - compression socks, elevation, salt restriction. No evidence of DVT or cellulitis.  3. Musculoskeletal back pain  - left rhomboid tenderness. No red flag back pain symptoms.  - discussed rolling out the muscle with a tennis or lacrosse ball against the wall - exercises discussed - cyclobenzaprine (FLEXERIL) 10 MG tablet; Take 1 tablet (10 mg total) by mouth at bedtime as needed for muscle spasms.  Dispense: 30 tablet; Refill: 0   Everrett Coombe, MD,MS,  PGY3 09/26/2018 2:30 PM

## 2018-09-27 LAB — CBC
HEMOGLOBIN: 13.2 g/dL (ref 11.1–15.9)
Hematocrit: 40 % (ref 34.0–46.6)
MCH: 29.8 pg (ref 26.6–33.0)
MCHC: 33 g/dL (ref 31.5–35.7)
MCV: 90 fL (ref 79–97)
Platelets: 263 10*3/uL (ref 150–450)
RBC: 4.43 x10E6/uL (ref 3.77–5.28)
RDW: 13.3 % (ref 12.3–15.4)
WBC: 10.1 10*3/uL (ref 3.4–10.8)

## 2018-09-27 LAB — BASIC METABOLIC PANEL
BUN/Creatinine Ratio: 19 (ref 9–23)
BUN: 15 mg/dL (ref 6–24)
CO2: 24 mmol/L (ref 20–29)
CREATININE: 0.79 mg/dL (ref 0.57–1.00)
Calcium: 9.6 mg/dL (ref 8.7–10.2)
Chloride: 98 mmol/L (ref 96–106)
GFR calc Af Amer: 102 mL/min/{1.73_m2} (ref >59)
GFR calc non Af Amer: 88 mL/min/{1.73_m2} (ref >59)
GLUCOSE: 107 mg/dL — AB (ref 65–99)
POTASSIUM: 4.1 mmol/L (ref 3.5–5.2)
SODIUM: 137 mmol/L (ref 134–144)

## 2018-09-28 ENCOUNTER — Encounter: Payer: Self-pay | Admitting: Student in an Organized Health Care Education/Training Program

## 2018-10-18 ENCOUNTER — Encounter: Payer: Self-pay | Admitting: Family Medicine

## 2018-10-18 ENCOUNTER — Other Ambulatory Visit: Payer: Self-pay

## 2018-10-18 ENCOUNTER — Ambulatory Visit (INDEPENDENT_AMBULATORY_CARE_PROVIDER_SITE_OTHER): Payer: Self-pay | Admitting: Family Medicine

## 2018-10-18 VITALS — BP 108/80 | HR 62 | Temp 98.2°F | Wt 207.0 lb

## 2018-10-18 DIAGNOSIS — H8110 Benign paroxysmal vertigo, unspecified ear: Secondary | ICD-10-CM | POA: Insufficient documentation

## 2018-10-18 DIAGNOSIS — J019 Acute sinusitis, unspecified: Secondary | ICD-10-CM | POA: Insufficient documentation

## 2018-10-18 DIAGNOSIS — J329 Chronic sinusitis, unspecified: Secondary | ICD-10-CM | POA: Insufficient documentation

## 2018-10-18 DIAGNOSIS — J0100 Acute maxillary sinusitis, unspecified: Secondary | ICD-10-CM

## 2018-10-18 MED ORDER — MECLIZINE HCL 25 MG PO TABS: 25.0000 mg | ORAL_TABLET | Freq: Three times a day (TID) | ORAL | 2 refills | Status: DC | PRN

## 2018-10-18 NOTE — Assessment & Plan Note (Signed)
-  Patient instructed to use saline rinse twice daily to rinse out nasal passages. -Take Ibuprofen/Advil 200mg  every 6-8 hours as needed for sinus pain and to decrease inner ear inflammation. -Follow-up as needed

## 2018-10-18 NOTE — Progress Notes (Signed)
   Subjective:    Patient ID: Deanna Scott, female    DOB: April 07, 1971, 47 y.o.   MRN: [id number]   CC: Dizziness  HPI: The patient is complaining of nasal congestion, runny nose, and sinus pressure at her cheeks and forehead for the last 5 days that is accompanied by dizziness.  She states she has a history of vertigo and states this feels a little bit like the dizziness she has with that.  She has taken Sudafed to help with the sinus infection but states she stopped taking this because it made her blood pressure go up to systolic 510.  Otherwise, she has not tried any medications to make herself feel better.  Nothing makes her feel worse.  She states she is eating and drinking adequately.  Review of Systems  Constitutional: Negative for chills, fever and malaise/fatigue.  HENT: Positive for congestion and sinus pain. Negative for ear pain and sore throat.   Eyes: Negative for blurred vision.  Respiratory: Positive for cough (Occasional, dry) and sputum production (Clear). Negative for shortness of breath.   Gastrointestinal: Negative for constipation, diarrhea, nausea and vomiting.    Objective:  BP 108/80   Pulse 62   Temp 98.2 F (36.8 C) (Oral)   Wt 207 lb (93.9 kg)   LMP 03/02/2012   SpO2 98%   BMI 37.86 kg/m  Vitals and nursing note reviewed  Physical Exam HENT:     Right Ear: Tympanic membrane normal.     Left Ear: Tympanic membrane normal.     Nose: Congestion and rhinorrhea present.     Mouth/Throat:     Mouth: Mucous membranes are moist.     Pharynx: No oropharyngeal exudate or posterior oropharyngeal erythema.  Eyes:     General:        Right eye: No discharge.        Left eye: No discharge.  Cardiovascular:     Rate and Rhythm: Normal rate and regular rhythm.  Pulmonary:     Effort: Pulmonary effort is normal. No respiratory distress.     Breath sounds: Normal breath sounds.  Lymphadenopathy:     Cervical: No cervical adenopathy.    Assessment & Plan:    Benign paroxysmal positional vertigo Vertigo/dizziness most likely acutely due to current viral sinus infection. -Patient instructed to use saline rinse twice daily to rinse out nasal passages. -Take Ibuprofen/Advil 200mg  every 6-8 hours as needed for sinus pain and to decrease inner ear inflammation. -Patient prescribed Antivert 25 mg 3 times daily as needed for dizziness. -Follow-up as needed  Acute non-recurrent maxillary sinusitis -Patient instructed to use saline rinse twice daily to rinse out nasal passages. -Take Ibuprofen/Advil 200mg  every 6-8 hours as needed for sinus pain and to decrease inner ear inflammation. -Follow-up as needed   Return if symptoms worsen or fail to improve.  Dr. Milus Banister [facility], PGY-1

## 2018-10-18 NOTE — Assessment & Plan Note (Signed)
Vertigo/dizziness most likely acutely due to current viral sinus infection. -Patient instructed to use saline rinse twice daily to rinse out nasal passages. -Take Ibuprofen/Advil 200mg  every 6-8 hours as needed for sinus pain and to decrease inner ear inflammation. -Patient prescribed Antivert 25 mg 3 times daily as needed for dizziness. -Follow-up as needed

## 2018-10-18 NOTE — Patient Instructions (Addendum)
Thank you for coming in to see [place] today! Please see below to review our plan for today's visit:  1. Use saline rinse twice daily to rinse out nasal passages. 2. Take Ibuprofen/Advil 200mg  every 6-8 hours as needed for sinus pain and to decrease inner ear inflammation. 3. I have prescribed Antivert for you 25mg  tablet that you can take 3 times daily as needed for dizziness.  Please call the clinic at [phone] if your symptoms worsen or you have any concerns. It was our pleasure to serve you!     Dr. Milus Banister [facility]   Dizziness Dizziness is a common problem. It makes you feel unsteady or light-headed. You may feel like you are about to pass out (faint). Dizziness can lead to getting hurt if you stumble or fall. Dizziness can be caused by many things, including:  Medicines.  Not having enough water in your body (dehydration).  Illness.  Follow these instructions at home: Eating and drinking  Drink enough fluid to keep your pee (urine) clear or pale yellow. This helps to keep you from getting dehydrated. Try to drink more clear fluids, such as water.  Do not drink alcohol.  Limit how much caffeine you drink or eat, if your doctor tells you to do that.  Limit how much salt (sodium) you drink or eat, if your doctor tells you to do that. Activity  Avoid making quick movements. ? When you stand up from sitting in a chair, steady yourself until you feel okay. ? In the morning, first sit up on the side of the bed. When you feel okay, stand slowly while you hold onto something. Do this until you know that your balance is fine.  If you need to stand in one place for a long time, move your legs often. Tighten and relax the muscles in your legs while you are standing.  Do not drive or use heavy machinery if you feel dizzy.  Avoid bending down if you feel dizzy. Place items in your home so you can reach them easily without leaning over. Lifestyle  Do  not use any products that contain nicotine or tobacco, such as cigarettes and e-cigarettes. If you need help quitting, ask your doctor.  Try to lower your stress level. You can do this by using methods such as yoga or meditation. Talk with your doctor if you need help. General instructions  Watch your dizziness for any changes.  Take over-the-counter and prescription medicines only as told by your doctor. Talk with your doctor if you think that you are dizzy because of a medicine that you are taking.  Tell a friend or a family member that you are feeling dizzy. If he or she notices any changes in your behavior, have this person call your doctor.  Keep all follow-up visits as told by your doctor. This is important. Contact a doctor if:  Your dizziness does not go away.  Your dizziness or light-headedness gets worse.  You feel sick to your stomach (nauseous).  You have trouble hearing.  You have new symptoms.  You are unsteady on your feet.  You feel like the room is spinning. Get help right away if:  You throw up (vomit) or have watery poop (diarrhea), and you cannot eat or drink anything.  You have trouble: ? Talking. ? Walking. ? Swallowing. ? Using your arms, hands, or legs.  You feel generally weak.  You are not thinking clearly, or you have trouble forming  sentences. A friend or family member may notice this.  You have: ? Chest pain. ? Pain in your belly (abdomen). ? Shortness of breath. ? Sweating.  Your vision changes.  You are bleeding.  You have a very bad headache.  You have neck pain or a stiff neck.  You have a fever. These symptoms may be an emergency. Do not wait to see if the symptoms will go away. Get medical help right away. Call your local emergency services (911 in the [place] Do not drive yourself to the hospital. Summary  Dizziness makes you feel unsteady or light-headed. You may feel like you are about to pass out (faint).  Drink enough  fluid to keep your pee (urine) clear or pale yellow. Do not drink alcohol.  Avoid making quick movements if you feel dizzy.  Watch your dizziness for any changes. This information is not intended to replace advice given to you by your health care provider. Make sure you discuss any questions you have with your health care provider. Document Released: 10/12/2011 Document Revised: 11/09/2016 Document Reviewed: 11/09/2016 Elsevier Interactive Patient Education  2017 [employer].

## 2019-05-29 ENCOUNTER — Telehealth (INDEPENDENT_AMBULATORY_CARE_PROVIDER_SITE_OTHER): Payer: Self-pay | Admitting: Family Medicine

## 2019-05-29 ENCOUNTER — Other Ambulatory Visit: Payer: Self-pay

## 2019-05-29 DIAGNOSIS — J321 Chronic frontal sinusitis: Secondary | ICD-10-CM

## 2019-05-29 MED ORDER — LORATADINE 10 MG PO TABS: 10.0000 mg | ORAL_TABLET | Freq: Every day | ORAL | 0 refills | Status: DC

## 2019-05-29 MED ORDER — FLUTICASONE PROPIONATE 50 MCG/ACT NA SUSP: 2.0000 | Freq: Every day | NASAL | 0 refills | Status: DC

## 2019-05-29 NOTE — Assessment & Plan Note (Addendum)
Symptoms present for greater than 6 months.  Currently taking over-the-counter "allergy medication".  Not sure exactly what she is taking.  We will switch over-the-counter medication to Claritin 10 mg daily.  Also try Flonase 2 puffs each nostril daily.  Patient to follow-up if not feeling better within 1 to 2 weeks.  Advised patient that would likely need to see her back in clinic if her symptoms do not improve.  In regards to the patient concern about CSF leak secondary to her concussions.  Felt to be exceedingly unlikely given the chronicity, and that the patient's symptoms are not more severe.

## 2019-05-29 NOTE — Progress Notes (Signed)
[facility] Tele[facility] Visit  Patient consented to have virtual visit. Method of visit: Telephone  Encounter participants: Patient: Deanna Scott - located at home Provider: Guadalupe Dawn - [facility] at [facility]  Chief Complaint: Rhinorrhea  HPI: Very pleasant 48 year old female presents a telephone visit for rhinorrhea.  Patient states that whenever she bends over she notices a good amount of clear, watery fluid from her nose.  States that her nose does run when she is upright but it gets worse when that happens.  States her symptoms first started about 6 months ago, but they have gotten worse recently.  She states that she has been taking over-the-counter allergy medication.  Patient does have a history of chronic sinusitis for which she has been instructed to rinse out nasal passages and take ibuprofen.  Has never taken intranasal steroid before.  Patient is concerned that this might be due to to 2 concussion she has had in the past.  Of note she had these concussions several months before her symptoms started.  ROS: per HPI  Pertinent PMHx: Chronic sinusitis, history of concussions  Exam:  General: Very pleasant, no acute distress Respiratory: No respiratory distress, able speak in clear coherent sentences Psych: Coherent thought process, appropriate mentation, very pleasant  Assessment/Plan:  Chronic sinusitis Symptoms present for greater than 6 months.  Currently taking over-the-counter "allergy medication".  Not sure exactly what she is taking.  We will switch over-the-counter medication to Claritin 10 mg daily.  Also try Flonase 2 puffs each nostril daily.  Patient to follow-up if not feeling better within 1 to 2 weeks.  Advised patient that would likely need to see her back in clinic if her symptoms do not improve.  In regards to the patient concern about CSF leak secondary to her concussions.  Felt to be exceedingly unlikely given the chronicity, and  that the patient's symptoms are not more severe.    Time spent during visit with patient: 8 minutes

## 2019-06-02 ENCOUNTER — Telehealth: Payer: Self-pay | Admitting: *Deleted

## 2019-06-02 NOTE — Telephone Encounter (Signed)
Pt left message on nurse line that the medication she was Rx'd  Makes her "itchy, causes and headache and has not improved my runny nose".  Attempted to call back, but had to leave message.   Advised I would send message to Dr. Kris Mouton.  Christen Bame, CMA

## 2019-06-05 ENCOUNTER — Ambulatory Visit (INDEPENDENT_AMBULATORY_CARE_PROVIDER_SITE_OTHER): Payer: Self-pay | Admitting: Family Medicine

## 2019-06-05 ENCOUNTER — Other Ambulatory Visit: Payer: Self-pay

## 2019-06-05 ENCOUNTER — Encounter: Payer: Self-pay | Admitting: Family Medicine

## 2019-06-05 VITALS — BP 130/84 | HR 60 | Wt 207.2 lb

## 2019-06-05 DIAGNOSIS — R42 Dizziness and giddiness: Secondary | ICD-10-CM

## 2019-06-05 DIAGNOSIS — Z1211 Encounter for screening for malignant neoplasm of colon: Secondary | ICD-10-CM

## 2019-06-05 DIAGNOSIS — Z1239 Encounter for other screening for malignant neoplasm of breast: Secondary | ICD-10-CM

## 2019-06-05 DIAGNOSIS — J321 Chronic frontal sinusitis: Secondary | ICD-10-CM

## 2019-06-05 LAB — POCT HEMOGLOBIN: Hemoglobin: 13 g/dL (ref 11–14.6)

## 2019-06-05 MED ORDER — FEXOFENADINE-PSEUDOEPHED ER 60-120 MG PO TB12: 1.0000 | ORAL_TABLET | Freq: Two times a day (BID) | ORAL | 3 refills | Status: DC

## 2019-06-05 NOTE — Patient Instructions (Addendum)
Thank you for coming to see me today. It was a pleasure! Today we talked about:   Your dizziness, headaches and runny nose.  This could be related to allergies as we have discussed.  Please stop taking the loratadine.  We will trial Allegra-D instead.  We can also try doing the sinus rinse as discussed in the office.  There is a picture below of an example, please follow the instructions closely.  If your symptoms continue I recommend calling for a virtual visit or coming into the office in order to have a referral placed for ENT for further evaluation of your symptoms.  You are also due for your normal routine screenings such as colonoscopy and mammogram.  I have placed a referral for someone to call you regarding your colonoscopy and mammogram.    Please follow-up as needed.  If you have any questions or concerns, please do not hesitate to call the office at [phone].  Take Care,   [place] Meleena Munroe, DO

## 2019-06-05 NOTE — Progress Notes (Signed)
  Subjective:  Patient ID: Deanna Scott  DOB: 1971/04/17 MRN: [id number]  Deanna Scott is a 48 y.o. female with a PMH of chronic sinusitis, BPPV, here today for headache, rhinorrhea, dizzy.   HPI:  Headache, rhinorrhea, dizziness: -Patient reports that for about 1 year she has had her left nose running like a faucet every time she bends over. -She also reports intermittent headaches and dizziness.  She states that since she started the Flonase she has had a headache every day.  Reports she also thought that the loratadine that Dr. Kris Mouton prescribed her has caused this as well.  However she has been on loratadine chronically at home which she reports helped her prior to this episode but she was unsure of the name of the medication. -Patient reports that Excedrin has helped with her headaches and she gets these less than a month so they do not concern her as much.  However the dizziness she feels like causes her to lose her balance at times.  She states she has a history of vertigo but this does not feel like that.  She states that this feels more like a lightheadedness.   -She denies any chest pain, shortness of breath, nausea, vomiting -Patient reports that she has a history of 2 concussions and she is concerned that the dizziness may be related. -she denies any recent trauma, falls  Health maintenance Patient is due for her mammogram as well as colonoscopy  ROS: as mentioned in HPI  Family hx: No known family history of cancers  Social hx: Denies use of illicit drugs, alcohol use Smoking status reviewed  Patient Active Problem List   Diagnosis Date Noted  . Chronic sinusitis 10/18/2018  . Benign paroxysmal positional vertigo 10/18/2018  . Vaginal wall prolapse 07/29/2017  . Migraines 07/27/2017  . GERD (gastroesophageal reflux disease) 07/29/2014  . OBESITY, NOS 01/03/2007     Objective:  BP 130/84   Pulse 60   Wt 207 lb 3.2 oz (94 kg)   LMP 03/02/2012   SpO2 99%   BMI  37.90 kg/m   Vitals and nursing note reviewed  General: NAD, pleasant Cardiac: RRR, normal heart sounds, no m/r/g Pulm: normal effort, CTAB Extremities: no edema or cyanosis. WWP. Skin: warm and dry, no rashes noted Neuro: CN II-XII grossly intact, no focal deficits, BLUE and BLLE 5/5, sensation intact, gait normal, alert and oriented Psych: normal affect, normal thought content  Assessment & Plan:   Chronic sinusitis Leaking from her nose as well as dizziness could be related to this chronic sinusitis.  No abnormalities noted on neurological exam.  Point-of-care hemoglobin WNL.  Heart is RRR.  BP WNL at 130/84.  Orthostatics performed in the office today are WNL.  -We will trial patient on Allegra-D rather than the loratadine -Counseled her on saline nasal rinse and provided counseling on safe use of nasal lavage -Patient unable to tolerate Flonase.  Suggested other nasal spray however she is not interested at this time 2/2 cost.  -If patient has continued symptoms of dizziness and leakage from her nose may need further evaluation by ENT.  Patient is to call if she would like to have this referral placed. -Return precautions discussed  Health maintenance Ordered patient for mammogram and colonoscopy, may require FIT testing as she is unable to afford a colonoscopy as this is a much cheaper option.  [place] Charline Hoskinson, DO Family Medicine Resident PGY-3

## 2019-06-09 ENCOUNTER — Encounter: Payer: Self-pay | Admitting: Family Medicine

## 2019-06-09 NOTE — Assessment & Plan Note (Signed)
Leaking from her nose as well as dizziness could be related to this chronic sinusitis.  No abnormalities noted on neurological exam.  Point-of-care hemoglobin WNL.  Heart is RRR.  BP WNL at 130/84.  Orthostatics performed in the office today are WNL.  -We will trial patient on Allegra-D rather than the loratadine -Counseled her on saline nasal rinse and provided counseling on safe use of nasal lavage -Patient unable to tolerate Flonase.  Suggested other nasal spray however she is not interested at this time 2/2 cost.  -If patient has continued symptoms of dizziness and leakage from her nose may need further evaluation by ENT.  Patient is to call if she would like to have this referral placed. -Return precautions discussed

## 2019-06-16 ENCOUNTER — Other Ambulatory Visit: Payer: Self-pay

## 2019-06-16 ENCOUNTER — Ambulatory Visit (INDEPENDENT_AMBULATORY_CARE_PROVIDER_SITE_OTHER): Payer: Self-pay | Admitting: Family Medicine

## 2019-06-16 VITALS — BP 122/70 | HR 72

## 2019-06-16 DIAGNOSIS — M545 Low back pain, unspecified: Secondary | ICD-10-CM

## 2019-06-16 MED ORDER — METHOCARBAMOL 500 MG PO TABS: 500.0000 mg | ORAL_TABLET | Freq: Four times a day (QID) | ORAL | 0 refills | Status: DC

## 2019-06-16 MED ORDER — IBUPROFEN 600 MG PO TABS: 600.0000 mg | ORAL_TABLET | Freq: Three times a day (TID) | ORAL | 0 refills | Status: DC | PRN

## 2019-06-16 MED ORDER — ACETAMINOPHEN ER 650 MG PO TBCR: 1300.0000 mg | EXTENDED_RELEASE_TABLET | Freq: Three times a day (TID) | ORAL | 0 refills | Status: DC | PRN

## 2019-06-16 NOTE — Progress Notes (Signed)
Subjective: No chief complaint on file.  HPI: Deanna Scott is a 48 y.o. presenting to clinic today to discuss the following:  Low Back Pain Patient states for the past week she has had low back pain that is constant and feels like "a brick is in my back" and lots of pressure. She has never had this before. 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 The pain is non-radiating. She has tried a heating pad, OTC creams and nothing has helped so far. She was not in any accidents and no recent falls or trauma.  She denies fever, chills, abdominal pain, nausea, vomiting, no dysuria, no blood in the urine or stool, no saddle anesthesia. She does endorse urinary frequency.     ROS noted in HPI.   Past Medical, Surgical, Social, and Family History Reviewed & Updated per EMR.   Pertinent Historical Findings include:   Social History   Tobacco Use  Smoking Status Former Smoker  . Packs/day: 1.00  . Years: 34.00  . Pack years: 34.00  . Types: Cigarettes  . Quit date: 09/04/2015  . Years since quitting: 3.7  Smokeless Tobacco Never Used    Objective: BP 122/70   Pulse 72   LMP 03/02/2012   SpO2 99%  Vitals and nursing notes reviewed  Physical Exam Gen: Alert and Oriented x 3, NAD HEENT: Normocephalic, atraumatic CV: RRR, no murmurs, normal S1, S2 split Resp: CTAB, no wheezing, rales, or rhonchi, comfortable work of breathing MSK:  No bony deformity, does have some ecchymosis along the lower lumbar area on the right side, no spinous process tenderness, TTP along the lumbar musculature, Limited ROM in flexion and extension of spine, normal sidebending and rotation; gross sensation intact Ext: no clubbing, cyanosis, or edema Skin: warm, dry, intact, no rashes  Assessment/Plan:  Low back pain Acute low back pain most likely 2/2 to muscle strain/sprain of the lower lumbar musculature due to 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 - Advised patient to avoid lifting anything over 20lbs for the next 2 weeks and showed her proper technique on how to lift items - Tylenol 650mg  with Ibuprofen 600mg  TID with meals scheduled for the next 3-5 days, then prn - Heating pad, ice, rest, as needed. Can also continue with OTC muscle creams - Robaxin 500mg  QID prn - U/A due to urinary frequency to ensure no component of bladder infection or possible pyelo but this is very unlikely. - F/u in 4 weeks if no improvement; if no better consider formal PT   PATIENT EDUCATION PROVIDED: See AVS    Diagnosis and plan along with any newly prescribed medication(s) were discussed in detail with this patient today. The patient verbalized understanding and agreed with the plan. Patient advised if symptoms worsen return to clinic or ER.    Orders Placed This Encounter  Procedures  . Urinalysis    Meds ordered this encounter  Medications  . ibuprofen (ADVIL) 600 MG tablet    Sig: Take 1 tablet (600 mg total) by mouth every 8 (eight) hours as needed.    Dispense:  30 tablet    Refill:  0  . methocarbamol (ROBAXIN) 500 MG tablet    Sig: Take 1 tablet (500 mg total) by mouth 4 (four) times daily.    Dispense:  30 tablet    Refill:  0  . acetaminophen (TYLENOL 8 HOUR) 650 MG CR  tablet    Sig: Take 2 tablets (1,300 mg total) by mouth every 8 (eight) hours as needed for pain.    Dispense:  30 tablet    Refill:  0     Harolyn Rutherford, DO 06/16/2019, 11:14 AM PGY-3 [facility]

## 2019-06-16 NOTE — Patient Instructions (Addendum)
It was great to meet you today! Thank you for letting me participate in your care!  Today, we discussed your recent low back pain which I believe is due to a recent muscle strain. Please take the Tylenol and Ibuprofen 3 times per day as needed. Continue using the heating and muscle creams. I have also sent in a prescription for Robaxin, please take it 4 times a day as needed.  Be well, Harolyn Rutherford, DO PGY-3, [facility]

## 2019-06-18 ENCOUNTER — Encounter: Payer: Self-pay | Admitting: Family Medicine

## 2019-06-18 DIAGNOSIS — M545 Low back pain, unspecified: Secondary | ICD-10-CM | POA: Insufficient documentation

## 2019-06-18 NOTE — Assessment & Plan Note (Addendum)
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 - Advised patient to avoid lifting anything over 20lbs for the next 2 weeks and showed her proper technique on how to lift items - Tylenol 650mg  with Ibuprofen 600mg  TID with meals scheduled for the next 3-5 days, then prn - Heating pad, ice, rest, as needed. Can also continue with OTC muscle creams - Robaxin 500mg  QID prn - U/A due to urinary frequency to ensure no component of bladder infection or possible pyelo but this is very unlikely. - F/u in 4 weeks if no improvement; if no better consider formal PT

## 2019-07-21 ENCOUNTER — Ambulatory Visit (INDEPENDENT_AMBULATORY_CARE_PROVIDER_SITE_OTHER): Payer: Self-pay | Admitting: Family Medicine

## 2019-07-21 ENCOUNTER — Other Ambulatory Visit: Payer: Self-pay

## 2019-07-21 VITALS — BP 138/74 | HR 80

## 2019-07-21 DIAGNOSIS — M778 Other enthesopathies, not elsewhere classified: Secondary | ICD-10-CM

## 2019-07-21 DIAGNOSIS — M779 Enthesopathy, unspecified: Secondary | ICD-10-CM

## 2019-07-21 MED ORDER — IBUPROFEN 600 MG PO TABS: 600.0000 mg | ORAL_TABLET | Freq: Three times a day (TID) | ORAL | 0 refills | Status: DC | PRN

## 2019-07-21 MED ORDER — DICLOFENAC SODIUM 1 % TD GEL: 2.0000 g | Freq: Four times a day (QID) | TRANSDERMAL | Status: DC

## 2019-07-21 NOTE — Progress Notes (Signed)
    Subjective:  Deanna Scott is a 48 y.o. female who presents to the [facility] today with a chief complaint of right elbow pain.   HPI: Patient says that she is had about 3 weeks of right elbow pain.  Is also associated with pain with grip and aches with extension.  Patient works 3 jobs.  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  She has multiple repetitive actions with her upper extremities.  Patient denies any type of trauma to the arm.  Patient says that she is taking ibuprofen this does help with some of the pain, but she still has it.  The pain is predominantly constant but definitely worse with movement.  Patient is uninsured and cannot afford to take any time off work.  She has no option for light duty.  She does not think that she will be able to favor her left arm of her right arm and her work.  ROS: Per HPI  PMH: Smoking history reviewed.    Objective:  Physical Exam: BP 138/74   Pulse 80   LMP 03/02/2012   Gen: NAD, resting comfortably Pulm: NWOB,  MSK: Tenderness right above the olecranon on the posterior surface of the arm, no obvious swelling or deformity, 5/5 strength upper lower extremities bilaterally, normal grip strength, normal sensation intact throughout upper extremities Skin: warm, dry Neuro: grossly normal, moves all extremities Psych: Normal affect and thought content  No results found for this or any previous visit (from the past 72 hour(s)).   Assessment/Plan:  Triceps tendonitis Given the tenderness right above the olecranon at the insertion of the triceps, believe the patient has tricep tendinitis.  This is supported by the fact that she has multiple manual jobs and is repetitive motion probably causing a slow chronic injury.  I recommend NSAIDs, rest as much as possible, although this is difficult given patient's work predicament, recommend an over-the-counter brace for stability, can also try diclofenac gel  over-the-counter as needed for additional pain relief.  Patient follow-up in 2 to 3 weeks if not improved.   Lab Orders  No laboratory test(s) ordered today    Meds ordered this encounter  Medications  . ibuprofen (ADVIL) 600 MG tablet    Sig: Take 1 tablet (600 mg total) by mouth every 8 (eight) hours as needed.    Dispense:  30 tablet    Refill:  0  . diclofenac sodium (VOLTAREN) 1 % GEL    Sig: Apply 2 g topically 4 (four) times daily.      Marny Lowenstein, MD, MS FAMILY MEDICINE RESIDENT - PGY3 07/21/2019 4:41 PM

## 2019-07-21 NOTE — Assessment & Plan Note (Signed)
Given the tenderness right above the olecranon at the insertion of the triceps, believe the patient has tricep tendinitis.  This is supported by the fact that she has multiple manual jobs and is repetitive motion probably causing a slow chronic injury.  I recommend NSAIDs, rest as much as possible, although this is difficult given patient's work predicament, recommend an over-the-counter brace for stability, can also try diclofenac gel over-the-counter as needed for additional pain relief.  Patient follow-up in 2 to 3 weeks if not improved.

## 2019-07-21 NOTE — Patient Instructions (Signed)
Tendinitis  Tendinitis is swelling (inflammation) of a tendon. A tendon is a cord of tissue that connects muscle to bone. Tendinitis can cause pain, tenderness, and swelling. What are the causes?  Using a tendon or muscle too much (overuse). This is a common cause.  Wear and tear that happens as you age.  Injury.  Some medical conditions, such as arthritis.  Some medicines. What increases the risk? You are more likely to get this condition if you do activities that involve the same movements over and over again (repetitive motions). What are the signs or symptoms?  Pain.  Tenderness.  Mild swelling.  Decreased range of motion. How is this treated? This condition is usually treated with RICE therapy. RICE stands for:  Rest.  Ice.  Compression. This means putting pressure on the affected area.  Elevation. This means raising the affected area above the level of your heart. Treatment may also include:  Medicines for swelling or pain.  Exercises or physical therapy.  A brace or splint.  Surgery. This is rarely needed. Follow these instructions at home: If you have a splint or brace:  Wear the splint or brace as told by your doctor. Remove it only as told by your doctor.  Loosen the splint or brace if your fingers or toes: ? Tingle. ? Become numb. ? Turn cold and blue.  Keep the splint or brace clean.  If the splint or brace is not waterproof: ? Do not let it get wet. ? Cover it with a watertight covering when you take a bath or shower. Managing pain, stiffness, and swelling      If told, put ice on the affected area. ? If you have a removable splint or brace, remove it as told by your doctor. ? Put ice in a plastic bag. ? Place a towel between your skin and the bag. ? Leave the ice on for 20 minutes, 2-3 times a day.  Move the fingers or toes of the affected arm or leg often, if this applies. This helps to prevent stiffness and to lessen  swelling.  If told, raise the affected area above the level of your heart while you are sitting or lying down.  If told, put heat on the affected area before you exercise. Use the heat source that your doctor recommends, such as a moist heat pack or a heating pad. ? Place a towel between your skin and the heat source. ? Leave the heat on for 20-30 minutes. ? Remove the heat if your skin turns bright red. This is very important if you are unable to feel pain, heat, or cold. You may have a greater risk of getting burned. Driving  Do not drive or use heavy machinery while taking prescription pain medicine.  Ask your doctor when it is safe to drive if you have a splint or brace on any part of your arm or leg. Activity  Rest the affected area as told by your doctor.  Return to your normal activities as told by your doctor. Ask your doctor what activities are safe for you.  Avoid using the affected area while you have symptoms.  Do exercises as told by your doctor. General instructions  If you have a splint, do not put pressure on any part of the splint until it is fully hardened. This may take several hours.  Wear an elastic bandage or pressure (compression) wrap only as told by your doctor.  Take over-the-counter and prescription medicines only as   told by your doctor.  Keep all follow-up visits as told by your doctor. This is important. Contact a doctor if:  You do not get better.  You get new problems, such as numbness in your hands, and you do not know why. Summary  Tendinitis is swelling (inflammation) of a tendon.  You are more likely to get this condition if you do activities that involve the same movements over and over again.  This condition is usually treated with RICE therapy. RICE stands for rest, ice, compression, and elevate.  Avoid using the affected area while you have symptoms. This information is not intended to replace advice given to you by your health care  provider. Make sure you discuss any questions you have with your health care provider. Document Released: 02/02/2011 Document Revised: 04/30/2018 Document Reviewed: 03/13/2018 Elsevier Patient Education  2020 Elsevier Inc.  

## 2019-08-11 ENCOUNTER — Other Ambulatory Visit (HOSPITAL_COMMUNITY): Payer: Self-pay | Admitting: *Deleted

## 2019-08-11 DIAGNOSIS — Z1231 Encounter for screening mammogram for malignant neoplasm of breast: Secondary | ICD-10-CM

## 2019-10-07 ENCOUNTER — Encounter: Payer: Self-pay | Admitting: Family Medicine

## 2019-10-07 ENCOUNTER — Encounter (HOSPITAL_COMMUNITY): Payer: Self-pay

## 2019-10-07 ENCOUNTER — Ambulatory Visit (HOSPITAL_COMMUNITY)
Admission: RE | Admit: 2019-10-07 | Discharge: 2019-10-07 | Disposition: A | Discharge status: Home / Self Care | Payer: Self-pay | Source: Ambulatory Visit | Attending: Obstetrics and Gynecology | Admitting: Obstetrics and Gynecology

## 2019-10-07 ENCOUNTER — Other Ambulatory Visit: Payer: Self-pay

## 2019-10-07 ENCOUNTER — Ambulatory Visit (INDEPENDENT_AMBULATORY_CARE_PROVIDER_SITE_OTHER): Payer: Self-pay | Admitting: Family Medicine

## 2019-10-07 ENCOUNTER — Ambulatory Visit
Admission: RE | Admit: 2019-10-07 | Discharge: 2019-10-07 | Disposition: A | Discharge status: Home / Self Care | Payer: No Typology Code available for payment source | Source: Ambulatory Visit | Attending: Obstetrics and Gynecology | Admitting: Obstetrics and Gynecology

## 2019-10-07 VITALS — BP 130/82 | HR 70 | Wt 205.0 lb

## 2019-10-07 DIAGNOSIS — M7711 Lateral epicondylitis, right elbow: Secondary | ICD-10-CM

## 2019-10-07 DIAGNOSIS — Z1239 Encounter for other screening for malignant neoplasm of breast: Secondary | ICD-10-CM | POA: Insufficient documentation

## 2019-10-07 DIAGNOSIS — R5383 Other fatigue: Secondary | ICD-10-CM

## 2019-10-07 DIAGNOSIS — Z1231 Encounter for screening mammogram for malignant neoplasm of breast: Secondary | ICD-10-CM

## 2019-10-07 MED ORDER — MELOXICAM 7.5 MG PO TABS: 7.5000 mg | ORAL_TABLET | Freq: Every day | ORAL | 0 refills | Status: DC

## 2019-10-07 NOTE — Progress Notes (Addendum)
No complaints today.   Pap Smear: Pap smear not completed today. Last Pap smear was 11/02/2011 at [facility] and normal with endometrial cells present. Per patient has a history of an abnormal Pap smear around 20 years ago that she thinks a colposcopy and either cryotherapy or LEEP were completed for follow-up. Patient has a history of a hysterectomy 03/13/2012 due to uterine prolapse. Patient doesn't need any further Pap smears due to her history of a hysterectomy for benign reasons. Last Pap smear result is in Epic.  Physical exam: Breasts Breasts symmetrical. No skin abnormalities bilateral breasts. No nipple retraction bilateral breasts. No nipple discharge bilateral breasts. No lymphadenopathy. No lumps palpated bilateral breasts. No complaints of pain or tenderness on exam. Referred patient to the [facility] for a screening mammogram. Appointment scheduled for Tuesday, October 07, 2019 at 1540.        Pelvic/Bimanual No Pap smear completed today since patient has a history of a hysterectomy for benign reasons. Pap smear not indicated per BCCCP guidelines.   Smoking History: Patient is a former smoker.  Patient Navigation: Patient education provided. Access to services provided for patient through [facility] program.   Colorectal Cancer Screening: Per patient has never had a colonoscopy completed. No complaints today.   Breast and Cervical Cancer Risk Assessment: Patient has no family history of breast cancer, known genetic mutations, or radiation treatment to the chest before age 57. Per patient has a history of cervical dysplasia. Patient has no history of being immunocompromised or DES exposure in-utero.  Risk Assessment    Risk Scores      10/07/2019   Last edited by: Loletta Parish, RN   5-year risk: 0.8 %   Lifetime risk: 7.4 %

## 2019-10-07 NOTE — Progress Notes (Signed)
  Patient Name: Deanna Scott Date of Birth: 1971/01/16 Date of Visit: 10/07/2019 PCP: Carollee Leitz, MD  Chief Complaint:   Subjective: Deanna Scott is a pleasant 48 y.o. with medical history significant for right shoulder pain, GERD presenting today for physical and follow-up of right elbow pain..   Fatigue Patient reports feeling somewhat tired lately.  Ongoing 3 to 4 months.  She works 3 jobs.  Sleeps 7 to 8 hours nightly.  Sometimes feel refreshed upon awakening.  She reports having good sleep hygiene.    Right elbow pain Only seen in clinic for tendinitis.  Was treated with Advil and diclofenac gel.  Patient reports this has not helped and pain has been increasing.  She now has difficulty with flexion of her right index finger.  Denies any numbness or tingling.  Her job requires repetitive use of elbow.  She cleans for living.  Annual physical Patient denies any chest pain, shortness of breath, abdominal pain.  She feels overall in good health.  Takes vitamins.  ROS: Per HPI.   I have reviewed the patient's medical, surgical, family, and social history as appropriate.  Vitals:   10/07/19 0835  BP: 130/82  Pulse: 70  SpO2: 99%   General: Alert and oriented, no apparent distress  Cardiovascular: RRR with no murmurs noted Respiratory: CTA bilaterally   Gastrointestinal: Bowel sounds present. No abdominal pain MSK: Upper extremity strength 5/5 bilaterally, Lower extremity strength 5/5 bilaterally, pain elicited over right lateral epicondyle area.  No numbness, tingling sensations.  Able to supinate and pronate. Psych: Behavior and speech appropriate to situation   Lateral epicondylitis Pain produced over lateral epicondyle area.  She has good strength and full range of motion. We will try meloxicam 7.5 mg daily  I have ordered a elbow brace and she will pick up with sports clinic. Continue with rest, ice. Follow-up in clinic in 4 weeks.  Fatigue Patient reports being  tired last 3 to 4 months.  She does work 3 jobs.  She reports sleeping 7 to 8 hours nightly.  Has good sleep hygiene. We will check CBC and TSH  Annual physical Repeat 1 year  Return to care in 3-4 weeks.   Carollee Leitz, MD  Family Medicine Teaching Service

## 2019-10-07 NOTE — Patient Instructions (Signed)
It was a pleasure to meet you today.  I have arranged for you to pick up a normal brace at sports medicine clinic.  I have sent a prescription for meloxicam 7.5 mg, take 1 tablet once a day.  Use ice ice packs as needed on the elbow to help relieve inflammation.   I have also ordered blood work and will call you with results if they are abnormal.  If not we can discuss them at your next visit.  Follow-up in 3 to 4 weeks.  If your pain worsens before your next visit please make an appointment to see your doctor.   Carollee Leitz MD

## 2019-10-07 NOTE — Patient Instructions (Signed)
Explained breast self awareness with [facility]. Patient did not need a Pap smear today due to patient has a history of a hysterectomy for benign reasons. Let patient know that she doesn't need any further Pap smears due to her history of a hysterectomy for benign reasons. Referred patient to the [facility] for a screening mammogram. Appointment scheduled for Tuesday, October 07, 2019 at 1540. Patient aware of appointment and will be there. Let patient know the [facility] will follow up with her within the next couple weeks with results of mammogram by letter or phone. Belky Coltharp verbalized understanding.  Lucero Auzenne, Arvil Chaco, RN 2:34 PM

## 2019-10-08 DIAGNOSIS — M7710 Lateral epicondylitis, unspecified elbow: Secondary | ICD-10-CM | POA: Insufficient documentation

## 2019-10-08 DIAGNOSIS — R5383 Other fatigue: Secondary | ICD-10-CM | POA: Insufficient documentation

## 2019-10-08 LAB — CBC WITH DIFFERENTIAL/PLATELET
Basophils Absolute: 0 10*3/uL (ref 0.0–0.2)
Basos: 1 %
EOS (ABSOLUTE): 0.2 10*3/uL (ref 0.0–0.4)
Eos: 2 %
Hematocrit: 42 % (ref 34.0–46.6)
Hemoglobin: 13.6 g/dL (ref 11.1–15.9)
Immature Grans (Abs): 0 10*3/uL (ref 0.0–0.1)
Immature Granulocytes: 0 %
Lymphocytes Absolute: 1.9 10*3/uL (ref 0.7–3.1)
Lymphs: 25 %
MCH: 30.1 pg (ref 26.6–33.0)
MCHC: 32.4 g/dL (ref 31.5–35.7)
MCV: 93 fL (ref 79–97)
Monocytes Absolute: 0.6 10*3/uL (ref 0.1–0.9)
Monocytes: 7 %
Neutrophils Absolute: 5 10*3/uL (ref 1.4–7.0)
Neutrophils: 65 %
Platelets: 262 10*3/uL (ref 150–450)
RBC: 4.52 x10E6/uL (ref 3.77–5.28)
RDW: 13.1 % (ref 11.7–15.4)
WBC: 7.8 10*3/uL (ref 3.4–10.8)

## 2019-10-08 LAB — TSH: TSH: 2.51 u[IU]/mL (ref 0.450–4.500)

## 2019-10-08 NOTE — Assessment & Plan Note (Signed)
Patient reports being tired last 3 to 4 months.  She does work 3 jobs.  She reports sleeping 7 to 8 hours nightly.  Has good sleep hygiene. We will check CBC and TSH

## 2019-10-08 NOTE — Assessment & Plan Note (Signed)
Pain produced over lateral epicondyle area.  She has good strength and full range of motion. We will try meloxicam 7.5 mg daily  I have ordered a elbow brace and she will pick up with sports clinic. Continue with rest, ice. Follow-up in clinic in 4 weeks.

## 2019-10-13 ENCOUNTER — Telehealth: Payer: Self-pay

## 2019-10-13 NOTE — Telephone Encounter (Signed)
Patient calls nurse line stating Meloxicam is making her dizzy. Patient stated she started taking on 12/1 and endorses dizziness everyday since. Patient denies any N/V or other symptoms. Patient is requesting a new medication to try to see if she sees improvement. Patient also requesting a referral to ENT, patient stated this was mentioned at last visit. Please advise.

## 2019-10-17 NOTE — Telephone Encounter (Signed)
Pt is calling to check status of different medication. Christen Bame, CMA

## 2019-10-23 ENCOUNTER — Other Ambulatory Visit: Payer: Self-pay

## 2019-10-23 ENCOUNTER — Telehealth (INDEPENDENT_AMBULATORY_CARE_PROVIDER_SITE_OTHER): Payer: Self-pay | Admitting: Family Medicine

## 2019-10-23 DIAGNOSIS — R0602 Shortness of breath: Secondary | ICD-10-CM

## 2019-10-23 MED ORDER — ALBUTEROL SULFATE HFA 108 (90 BASE) MCG/ACT IN AERS: 1.0000 | INHALATION_SPRAY | RESPIRATORY_TRACT | 2 refills | Status: DC | PRN

## 2019-10-23 MED ORDER — UMECLIDINIUM-VILANTEROL 62.5-25 MCG/INH IN AEPB: 1.0000 | INHALATION_SPRAY | Freq: Every day | RESPIRATORY_TRACT | 0 refills | Status: DC

## 2019-10-23 NOTE — Progress Notes (Signed)
[facility] Telemedicine Visit  Patient consented to have virtual visit. Method of visit: Telephone  Encounter participants: Patient: Deanna Scott - located at home Provider: Kathrene Alu - located at [facility] Others (if applicable): none  Chief Complaint: breathing difficulty  HPI:  Going on for 6-8 months Chest feels heavy Feels short of breath Exacerbating factors: none identified Alleviating factors: vicks, less humid weather 1 ppd smoker for 34 years, quit four years ago Lightheaded when short of breath No fever, cough, congestion, sick contacts   ROS: per HPI  Pertinent PMHx: history of tobacco use, obesity, chronic sinusitis  Exam:  Respiratory: Able to speak in full sentences without shortness of breath, no cough  Assessment/Plan:  Shortness of breath Appears to be chronic, given patient's significant smoking history and lack of infectious symptoms, this is likely a form of COPD, although the diagnosis cannot be confirmed without PFTs, which we are currently not doing in our clinic due to Covid.  We will treat empirically with as needed albuterol and Anoro Ellipta.  Sample provided to patient due to her lack of insurance.  Patient was congratulated on quitting smoking and given information on COPD.    Time spent during visit with patient: 12 minutes

## 2019-10-23 NOTE — Assessment & Plan Note (Signed)
Appears to be chronic, given patient's significant smoking history and lack of infectious symptoms, this is likely a form of COPD, although the diagnosis cannot be confirmed without PFTs, which we are currently not doing in our clinic due to Covid.  We will treat empirically with as needed albuterol and Anoro Ellipta.  Sample provided to patient due to her lack of insurance.  Patient was congratulated on quitting smoking and given information on COPD.

## 2019-10-23 NOTE — Patient Instructions (Addendum)
We are giving you a medication called Anoro Ellipta, which can control the symptoms of COPD.  COPD stands for chronic obstructive pulmonary disease, and it is from lung damage from smoking.  I am not completely sure that you have this illness because we cannot do the official pulmonary testing right now, but based on your symptoms, this is likely.  I have also sent albuterol to your pharmacy, which is affordable and can be used if you feel short of breath.  However, please use the Anoro Ellipta once daily even if you are not feeling short of breath. Chronic Obstructive Pulmonary Disease Chronic obstructive pulmonary disease (COPD) is a long-term (chronic) lung problem. When you have COPD, it is hard for air to get in and out of your lungs. Usually the condition gets worse over time, and your lungs will never return to normal. There are things you can do to keep yourself as healthy as possible.  Your doctor may treat your condition with: ? Medicines. ? Oxygen. ? Lung surgery.  Your doctor may also recommend: ? Rehabilitation. This includes steps to make your body work better. It may involve a team of specialists. ? Quitting smoking, if you smoke. ? Exercise and changes to your diet. ? Comfort measures (palliative care). Follow these instructions at home: Medicines  Take over-the-counter and prescription medicines only as told by your doctor.  Talk to your doctor before taking any cough or allergy medicines. You may need to avoid medicines that cause your lungs to be dry. Lifestyle  If you smoke, stop. Smoking makes the problem worse. If you need help quitting, ask your doctor.  Avoid being around things that make your breathing worse. This may include smoke, chemicals, and fumes.  Stay active, but remember to rest as well.  Learn and use tips on how to relax.  Make sure you get enough sleep. Most adults need at least 7 hours of sleep every night.  Eat healthy foods. Eat smaller meals  more often. Rest before meals. Controlled breathing Learn and use tips on how to control your breathing as told by your doctor. Try:  Breathing in (inhaling) through your nose for 1 second. Then, pucker your lips and breath out (exhale) through your lips for 2 seconds.  Putting one hand on your belly (abdomen). Breathe in slowly through your nose for 1 second. Your hand on your belly should move out. Pucker your lips and breathe out slowly through your lips. Your hand on your belly should move in as you breathe out.  Controlled coughing Learn and use controlled coughing to clear mucus from your lungs. Follow these steps: 1. Lean your head a little forward. 2. Breathe in deeply. 3. Try to hold your breath for 3 seconds. 4. Keep your mouth slightly open while coughing 2 times. 5. Spit any mucus out into a tissue. 6. Rest and do the steps again 1 or 2 times as needed. General instructions  Make sure you get all the shots (vaccines) that your doctor recommends. Ask your doctor about a flu shot and a pneumonia shot.  Use oxygen therapy and pulmonary rehabilitation if told by your doctor. If you need home oxygen therapy, ask your doctor if you should buy a tool to measure your oxygen level (oximeter).  Make a COPD action plan with your doctor. This helps you to know what to do if you feel worse than usual.  Manage any other conditions you have as told by your doctor.  Avoid going  outside when it is very hot, cold, or humid.  Avoid people who have a sickness you can catch (contagious).  Keep all follow-up visits as told by your doctor. This is important. Contact a doctor if:  You cough up more mucus than usual.  There is a change in the color or thickness of the mucus.  It is harder to breathe than usual.  Your breathing is faster than usual.  You have trouble sleeping.  You need to use your medicines more often than usual.  You have trouble doing your normal activities such as  getting dressed or walking around the house. Get help right away if:  You have shortness of breath while resting.  You have shortness of breath that stops you from: ? Being able to talk. ? Doing normal activities.  Your chest hurts for longer than 5 minutes.  Your skin color is more blue than usual.  Your pulse oximeter shows that you have low oxygen for longer than 5 minutes.  You have a fever.  You feel too tired to breathe normally. Summary  Chronic obstructive pulmonary disease (COPD) is a long-term lung problem.  The way your lungs work will never return to normal. Usually the condition gets worse over time. There are things you can do to keep yourself as healthy as possible.  Take over-the-counter and prescription medicines only as told by your doctor.  If you smoke, stop. Smoking makes the problem worse. This information is not intended to replace advice given to you by your health care provider. Make sure you discuss any questions you have with your health care provider. Document Released: 04/10/2008 Document Revised: 10/05/2017 Document Reviewed: 11/27/2016 Elsevier Patient Education  2020 [employer].  How to Use a Metered Dose Inhaler A metered dose inhaler is a handheld device for taking medicine that must be breathed into the lungs (inhaled). The device can be used to deliver a variety of inhaled medicines, including:  Quick relief or rescue medicines, such as bronchodilators.  Controller medicines, such as corticosteroids. The medicine is delivered by pushing down on a metal canister to release a preset amount of spray and medicine. Each device contains the amount of medicine that is needed for a preset number of uses (inhalations). Your health care provider may recommend that you use a spacer with your inhaler to help you take the medicine more effectively. A spacer is a plastic tube with a mouthpiece on one end and an opening that connects to the inhaler on the  other end. A spacer holds the medicine in a tube for a short time, which allows you to inhale more medicine. What are the risks? If you do not use your inhaler correctly, medicine might not reach your lungs to help you breathe. Inhaler medicine can cause side effects, such as:  Mouth or throat infection.  Cough.  Hoarseness.  Headache.  Nausea and vomiting.  Lung infection (pneumonia) in people who have a lung condition called COPD. How to use a metered dose inhaler without a spacer  1. Remove the cap from the inhaler. 2. If you are using the inhaler for the first time, shake it for 5 seconds, turn it away from your face, then release 4 puffs into the air. This is called priming. 3. Shake the inhaler for 5 seconds. 4. Position the inhaler so the top of the canister faces up. 5. Put your index finger on the top of the medicine canister. Support the bottom of the inhaler with  your thumb. 6. Breathe out normally and as completely as possible, away from the inhaler. 7. Either place the inhaler between your teeth and close your lips tightly around the mouthpiece, or hold the inhaler 1-2 inches (2.5-5 cm) away from your open mouth. Keep your tongue down out of the way. If you are unsure which technique to use, ask your health care provider. 8. Press the canister down with your index finger to release the medicine, then inhale deeply and slowly through your mouth (not your nose) until your lungs are completely filled. Inhaling should take 4-6 seconds. 9. Hold the medicine in your lungs for 5-10 seconds (10 seconds is best). This helps the medicine get into the small airways of your lungs. 10. With your lips in a tight circle (pursed), breathe out slowly. 11. Repeat steps 3-10 until you have taken the number of puffs that your health care provider directed. Wait about 1 minute between puffs or as directed. 12. Put the cap on the inhaler. 13. If you are using a steroid inhaler, rinse your mouth  with water, gargle, and spit out the water. Do not swallow the water. How to use a metered dose inhaler with a spacer  1. Remove the cap from the inhaler. 2. If you are using the inhaler for the first time, shake it for 5 seconds, turn it away from your face, then release 4 puffs into the air. This is called priming. 3. Shake the inhaler for 5 seconds. 4. Place the open end of the spacer onto the inhaler mouthpiece. 5. Position the inhaler so the top of the canister faces up and the spacer mouthpiece faces you. 6. Put your index finger on the top of the medicine canister. Support the bottom of the inhaler and the spacer with your thumb. 7. Breathe out normally and as completely as possible, away from the spacer. 8. Place the spacer between your teeth and close your lips tightly around it. Keep your tongue down out of the way. 9. Press the canister down with your index finger to release the medicine, then inhale deeply and slowly through your mouth (not your nose) until your lungs are completely filled. Inhaling should take 4-6 seconds. 10. Hold the medicine in your lungs for 5-10 seconds (10 seconds is best). This helps the medicine get into the small airways of your lungs. 11. With your lips in a tight circle (pursed), breathe out slowly. 12. Repeat steps 3-11 until you have taken the number of puffs that your health care provider directed. Wait about 1 minute between puffs or as directed. 13. Remove the spacer from the inhaler and put the cap on the inhaler. 14. If you are using a steroid inhaler, rinse your mouth with water, gargle, and spit out the water. Do not swallow the water. Follow these instructions at home:  Take your inhaled medicine only as told by your health care provider. Do not use the inhaler more than directed by your health care provider.  Keep all follow-up visits as told by your health care provider. This is important.  If your inhaler has a counter, you can check it to  determine how full your inhaler is. If your inhaler does not have a counter, ask your health care provider when you will need to refill your inhaler and write the refill date on a calendar or on your inhaler canister. Note that you cannot know when an inhaler is empty by shaking it.  Follow directions on the package  insert for care and cleaning of your inhaler and spacer. Contact a health care provider if:  Symptoms are only partially relieved with your inhaler.  You are having trouble using your inhaler.  You have an increase in phlegm.  You have headaches. Get help right away if:  You feel little or no relief after using your inhaler.  You have dizziness.  You have a fast heart rate.  You have chills or a fever.  You have night sweats.  There is blood in your phlegm. Summary  A metered dose inhaler is a handheld device for taking medicine that must be breathed into the lungs (inhaled).  The medicine is delivered by pushing down on a metal canister to release a preset amount of spray and medicine.  Each device contains the amount of medicine that is needed for a preset number of uses (inhalations). This information is not intended to replace advice given to you by your health care provider. Make sure you discuss any questions you have with your health care provider. Document Released: 10/23/2005 Document Revised: 10/05/2017 Document Reviewed: 09/12/2016 Elsevier Patient Education  [place].  How to Use a Metered Dose Inhaler A metered dose inhaler is a handheld device for taking medicine that must be breathed into the lungs (inhaled). The device can be used to deliver a variety of inhaled medicines, including:  Quick relief or rescue medicines, such as bronchodilators.  Controller medicines, such as corticosteroids. The medicine is delivered by pushing down on a metal canister to release a preset amount of spray and medicine. Each device contains the amount of  medicine that is needed for a preset number of uses (inhalations). Your health care provider may recommend that you use a spacer with your inhaler to help you take the medicine more effectively. A spacer is a plastic tube with a mouthpiece on one end and an opening that connects to the inhaler on the other end. A spacer holds the medicine in a tube for a short time, which allows you to inhale more medicine. What are the risks? If you do not use your inhaler correctly, medicine might not reach your lungs to help you breathe. Inhaler medicine can cause side effects, such as:  Mouth or throat infection.  Cough.  Hoarseness.  Headache.  Nausea and vomiting.  Lung infection (pneumonia) in people who have a lung condition called COPD. How to use a metered dose inhaler without a spacer  14. Remove the cap from the inhaler. 15. If you are using the inhaler for the first time, shake it for 5 seconds, turn it away from your face, then release 4 puffs into the air. This is called priming. 16. Shake the inhaler for 5 seconds. 17. Position the inhaler so the top of the canister faces up. 18. Put your index finger on the top of the medicine canister. Support the bottom of the inhaler with your thumb. 19. Breathe out normally and as completely as possible, away from the inhaler. 20. Either place the inhaler between your teeth and close your lips tightly around the mouthpiece, or hold the inhaler 1-2 inches (2.5-5 cm) away from your open mouth. Keep your tongue down out of the way. If you are unsure which technique to use, ask your health care provider. 21. Press the canister down with your index finger to release the medicine, then inhale deeply and slowly through your mouth (not your nose) until your lungs are completely filled. Inhaling should take 4-6 seconds.  22. Hold the medicine in your lungs for 5-10 seconds (10 seconds is best). This helps the medicine get into the small airways of your  lungs. 23. With your lips in a tight circle (pursed), breathe out slowly. 24. Repeat steps 3-10 until you have taken the number of puffs that your health care provider directed. Wait about 1 minute between puffs or as directed. 25. Put the cap on the inhaler. 26. If you are using a steroid inhaler, rinse your mouth with water, gargle, and spit out the water. Do not swallow the water. How to use a metered dose inhaler with a spacer  15. Remove the cap from the inhaler. 16. If you are using the inhaler for the first time, shake it for 5 seconds, turn it away from your face, then release 4 puffs into the air. This is called priming. 17. Shake the inhaler for 5 seconds. 18. Place the open end of the spacer onto the inhaler mouthpiece. 19. Position the inhaler so the top of the canister faces up and the spacer mouthpiece faces you. 20. Put your index finger on the top of the medicine canister. Support the bottom of the inhaler and the spacer with your thumb. 21. Breathe out normally and as completely as possible, away from the spacer. 22. Place the spacer between your teeth and close your lips tightly around it. Keep your tongue down out of the way. 23. Press the canister down with your index finger to release the medicine, then inhale deeply and slowly through your mouth (not your nose) until your lungs are completely filled. Inhaling should take 4-6 seconds. 24. Hold the medicine in your lungs for 5-10 seconds (10 seconds is best). This helps the medicine get into the small airways of your lungs. 25. With your lips in a tight circle (pursed), breathe out slowly. 26. Repeat steps 3-11 until you have taken the number of puffs that your health care provider directed. Wait about 1 minute between puffs or as directed. 27. Remove the spacer from the inhaler and put the cap on the inhaler. 28. If you are using a steroid inhaler, rinse your mouth with water, gargle, and spit out the water. Do not swallow  the water. Follow these instructions at home:  Take your inhaled medicine only as told by your health care provider. Do not use the inhaler more than directed by your health care provider.  Keep all follow-up visits as told by your health care provider. This is important.  If your inhaler has a counter, you can check it to determine how full your inhaler is. If your inhaler does not have a counter, ask your health care provider when you will need to refill your inhaler and write the refill date on a calendar or on your inhaler canister. Note that you cannot know when an inhaler is empty by shaking it.  Follow directions on the package insert for care and cleaning of your inhaler and spacer. Contact a health care provider if:  Symptoms are only partially relieved with your inhaler.  You are having trouble using your inhaler.  You have an increase in phlegm.  You have headaches. Get help right away if:  You feel little or no relief after using your inhaler.  You have dizziness.  You have a fast heart rate.  You have chills or a fever.  You have night sweats.  There is blood in your phlegm. Summary  A metered dose inhaler is a handheld device  for taking medicine that must be breathed into the lungs (inhaled).  The medicine is delivered by pushing down on a metal canister to release a preset amount of spray and medicine.  Each device contains the amount of medicine that is needed for a preset number of uses (inhalations). This information is not intended to replace advice given to you by your health care provider. Make sure you discuss any questions you have with your health care provider. Document Released: 10/23/2005 Document Revised: 10/05/2017 Document Reviewed: 09/12/2016 Elsevier Patient Education  2020 [employer].

## 2019-10-24 ENCOUNTER — Telehealth (INDEPENDENT_AMBULATORY_CARE_PROVIDER_SITE_OTHER): Payer: Self-pay | Admitting: Family Medicine

## 2019-10-24 DIAGNOSIS — R200 Anesthesia of skin: Secondary | ICD-10-CM | POA: Insufficient documentation

## 2019-10-24 DIAGNOSIS — J321 Chronic frontal sinusitis: Secondary | ICD-10-CM

## 2019-10-24 DIAGNOSIS — R0602 Shortness of breath: Secondary | ICD-10-CM

## 2019-10-24 NOTE — Assessment & Plan Note (Signed)
Given the chronicity and resistance to prescribed treatments which includes Flonase, Allegra, Claritin, and unknown over-the-counter medication patient is been taking will make referral to ENT.  Patient without any insurance which may make this more difficult.

## 2019-10-24 NOTE — Progress Notes (Signed)
[facility] Visit  Patient consented to have virtual visit. Method of visit: Virtual attempted, telephone due to connection issue  Encounter participants: Patient: Deanna Scott - located at home Provider: Guadalupe Dawn - located at [facility] Others (if applicable):   Chief Complaint: chronic nasal drainage, right hand numbness  HPI: 48 year old female who presents for chronic right-sided nasal drainage and right hand numbness.  Patient states the chronic right-sided nasal drainage has been going on for several months.  Per review based on my note from 05/29/2019 the patient's symptoms have been going on for greater than 6 months even at that time.  Felt she met criteria for chronic sinusitis at that date.  Started on Flonase which she stated did not help her.  She has been on Allegra-D which has helped "a little bit".  She is also been using a Nettie pot and saline nasal rinse which has not helped her very much.  She continues to complain of right-sided frontal headache.  Patient was seen on 12/17 for shortness of breath.  Telemedicine visit although did complain subjective wheezing.  Was started on Anoro Ellipta (sample given) and albuterol.  Felt that this was due to COPD secondary to her 30+ year smoking history.  She states that shortness of breath has been doing well since then.  Patient is also complaining of hand numbness and tingling.  Patient states this is been going on for a few months.  Upon chart review she initially complained about this back in 09/2018.  Patient states that when she wakes up her hands feel like they are "asleep".  They will gradually "wake up" during the day, but can develop more of that numbness and tingling with a large amount of activity.  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  ROS: per HPI  Pertinent PMHx: Chronic sinusitis  Exam:  General: No acute distress, very pleasant Respiratory: No  shortness of breath, no audible wheezing Psych: Very pleasant, coherent thought process, no hurried speech  Assessment/Plan:  Chronic sinusitis Given the chronicity and resistance to prescribed treatments which includes Flonase, Allegra, Claritin, and unknown over-the-counter medication patient is been taking will make referral to ENT.  Patient without any insurance which may make this more difficult.  Shortness of breath Doing well from the standpoint.  Given Anoro sample on 12/17.  Also prescribed albuterol.  No audible wheezing during the phone call.  Hand numbness Patient describes unilateral hand numbness which is worse upon waking up in the morning and can occasionally become worse with activity.  Been evaluated for this problem in the past and was chalked up to carpal tunnel syndrome.  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  Of concern is the patient's 34-year smoking history and apparent fatigability of his hand.  Biggest concern would be an arterial blood flow issue.  Explained to patient that this would be very difficult to evaluate over the phone and recommended in person follow-up soon as possible with either myself or another provider at this clinic.  Likely benefit from pulse check, and if difficulty finding would recommend arterial Doppler.  Other items in the differential would be neuropathy although I do not see diabetes on her problem list and she takes B12 supplementation, could consider check of these labs in next appointment.    Time spent during visit with patient: 11 minutes

## 2019-10-24 NOTE — Assessment & Plan Note (Addendum)
Patient describes unilateral hand numbness which is worse upon waking up in the morning and can occasionally become worse with activity.  Been evaluated for this problem in the past and was chalked up to carpal tunnel syndrome.  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  Of concern is the patient's 34-year smoking history and apparent fatigability of his hand.  Biggest concern would be an arterial blood flow issue.  Explained to patient that this would be very difficult to evaluate over the phone and recommended in person follow-up soon as possible with either myself or another provider at this clinic.  Likely benefit from pulse check, and if difficulty finding would recommend arterial Doppler.  Other items in the differential would be neuropathy although I do not see diabetes on her problem list and she takes B12 supplementation, could consider check of these labs in next appointment.

## 2019-10-24 NOTE — Assessment & Plan Note (Signed)
Doing well from the standpoint.  Given Anoro sample on 12/17.  Also prescribed albuterol.  No audible wheezing during the phone call.

## 2019-12-02 ENCOUNTER — Other Ambulatory Visit: Payer: Self-pay | Admitting: Otolaryngology

## 2019-12-02 ENCOUNTER — Other Ambulatory Visit (HOSPITAL_COMMUNITY): Payer: Self-pay | Admitting: Otolaryngology

## 2019-12-02 DIAGNOSIS — G9600 Cerebrospinal fluid leak, unspecified: Secondary | ICD-10-CM

## 2019-12-05 ENCOUNTER — Ambulatory Visit (HOSPITAL_COMMUNITY)
Admission: RE | Admit: 2019-12-05 | Discharge: 2019-12-05 | Disposition: A | Discharge status: Home / Self Care | Payer: Self-pay | Source: Ambulatory Visit | Attending: Otolaryngology | Admitting: Otolaryngology

## 2019-12-05 ENCOUNTER — Other Ambulatory Visit: Payer: Self-pay

## 2019-12-05 DIAGNOSIS — G9600 Cerebrospinal fluid leak, unspecified: Secondary | ICD-10-CM | POA: Insufficient documentation

## 2020-01-13 HISTORY — PX: OTHER SURGICAL HISTORY: SHX169

## 2020-05-06 ENCOUNTER — Other Ambulatory Visit: Payer: Self-pay

## 2020-05-06 ENCOUNTER — Ambulatory Visit (INDEPENDENT_AMBULATORY_CARE_PROVIDER_SITE_OTHER): Payer: Self-pay | Admitting: Family Medicine

## 2020-05-06 ENCOUNTER — Encounter: Payer: Self-pay | Admitting: Family Medicine

## 2020-05-06 VITALS — BP 108/66 | HR 68 | Ht 62.0 in | Wt 210.0 lb

## 2020-05-06 DIAGNOSIS — R0683 Snoring: Secondary | ICD-10-CM

## 2020-05-06 DIAGNOSIS — R5383 Other fatigue: Secondary | ICD-10-CM

## 2020-05-06 DIAGNOSIS — G9600 Cerebrospinal fluid leak, unspecified: Secondary | ICD-10-CM

## 2020-05-06 DIAGNOSIS — R200 Anesthesia of skin: Secondary | ICD-10-CM

## 2020-05-06 LAB — POCT GLYCOSYLATED HEMOGLOBIN (HGB A1C): Hemoglobin A1C: 5.6 % (ref 4.0–5.6)

## 2020-05-06 NOTE — Progress Notes (Signed)
    SUBJECTIVE:   CHIEF COMPLAINT / HPI:   CC: hand numbness   Hand Numbness  Patient worsening bilateral hand numbness, right greater than left.  States that this is also going into her legs right greater than left.  Endorses having to shake her hands to wake them up.  Denies recent trauma.  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    Reports tingling in bilateral upper and lower extremities.  Not taking anything for this as she does not like taking medication.  Denies focal weakness and dropping things.  Had 2 prior episodes of leaking CSF since procedure in March.  She is to follow-up with [facility] ENT on 05/24/2020. Reports leaking CSF worse with bending down.  PERTINENT  PMH / PSH: CSF leak, migraines  OBJECTIVE:   BP 108/66   Pulse 68   Ht 5\' 2"  (1.575 m)   Wt 210 lb (95.3 kg)   LMP 03/02/2012   SpO2 97%   BMI 38.41 kg/m    GEN: pleasant age appearing female, in no acute distress  HEENT: Mucous membranes moist, no oropharyngeal lesions, Mallampati score 3-4 CV: regular rate and rhythm, no murmurs appreciated  RESP: no increased work of breathing, clear to ascultation bilaterally, DP and radial pulses 2+ ABD: Bowel sounds present. Soft, Nontender MSK:  Atraumatic, normal ROM, no edema  SKIN: warm, dry NEURO: Alert, oriented, cranial nerves II through XII grossly intact, gross sensation intact upper and lower extremities, strength 5 out of 5, C6, C7, L4, S1 reflexes 2 out of 4, normal speech     ASSESSMENT/PLAN:   Extremity numbness  Carpal tunnel likely as patient reports needing to flap her hands to wake them up.    Differential diagnosis for extremity numbness: Arterial blood flow-Cannot rule out arterial blood flow issue however has good radial and DP pulses -Consider arterial Dopplers  Metabolic causes: -BMP to check electrolytes, A1c to check for diabetes, B12 ordered  Concern for increased  intracranial pressure: Patient reports she snores, Mallampati score 3-4.  -Obtain sleep study  Intracranial mass Normal neurological exam. -If not improving consider CT head versus MRI  Neuropathy Consider gabapentin   CSF leak Follow-up ENT as scheduled 05/24/2020.     Lyndee Hensen, [place]

## 2020-05-06 NOTE — Patient Instructions (Signed)
It was great seeing you today!  Please check-out at the front desk before leaving the clinic. I'd like to see you back if your need for any new issues we're happy to fit you in, just give [place] a call!  Visit Remembers: - Continue to work on your healthy eating habits and incorporating exercise into your daily life. - You will get call from [facility] about sleep study  - Medicine Changes: Consider Gabapentin for neuropathy    Regarding lab work today:  Due to recent changes in healthcare laws, you may see the results of your imaging and laboratory studies on MyChart before your provider has had a chance to review them.  I understand that in some cases there may be results that are confusing or concerning to you. Not all laboratory results come back in the same time frame and you may be waiting for multiple results in order to interpret others.  Please give [place] 72 hours in order for your provider to thoroughly review all the results before contacting the office for clarification of your results. If everything is normal, you will get a letter in the mail or a message in My Chart. Please give [place] a call if you do not hear from [place] after 2 weeks.  Please bring all of your medications with you to each visit.    If you haven't already, sign up for My Chart to have easy access to your labs results, and communication with your primary care physician.  Feel free to call with any questions or concerns at any time, at [phone].   Take care,  Dr. Rushie [facility]

## 2020-05-07 DIAGNOSIS — G9600 Cerebrospinal fluid leak, unspecified: Secondary | ICD-10-CM | POA: Insufficient documentation

## 2020-05-07 LAB — VITAMIN B12: Vitamin B-12: 935 pg/mL (ref 232–1245)

## 2020-05-07 LAB — CBC WITH DIFFERENTIAL/PLATELET
Basophils Absolute: 0.1 10*3/uL (ref 0.0–0.2)
Basos: 1 %
EOS (ABSOLUTE): 0.1 10*3/uL (ref 0.0–0.4)
Eos: 2 %
Hematocrit: 40.6 % (ref 34.0–46.6)
Hemoglobin: 13.5 g/dL (ref 11.1–15.9)
Immature Grans (Abs): 0 10*3/uL (ref 0.0–0.1)
Immature Granulocytes: 0 %
Lymphocytes Absolute: 1.7 10*3/uL (ref 0.7–3.1)
Lymphs: 24 %
MCH: 30.1 pg (ref 26.6–33.0)
MCHC: 33.3 g/dL (ref 31.5–35.7)
MCV: 91 fL (ref 79–97)
Monocytes Absolute: 0.5 10*3/uL (ref 0.1–0.9)
Monocytes: 7 %
Neutrophils Absolute: 4.6 10*3/uL (ref 1.4–7.0)
Neutrophils: 66 %
Platelets: 246 10*3/uL (ref 150–450)
RBC: 4.48 x10E6/uL (ref 3.77–5.28)
RDW: 13.1 % (ref 11.7–15.4)
WBC: 7 10*3/uL (ref 3.4–10.8)

## 2020-05-07 NOTE — Assessment & Plan Note (Signed)
Follow-up ENT as scheduled 05/24/2020.

## 2020-05-07 NOTE — Assessment & Plan Note (Addendum)
Carpal tunnel likely as patient reports needing to flap her hands to wake them up.    Differential diagnosis for extremity numbness: Arterial blood flow-Cannot rule out arterial blood flow issue however has good radial and DP pulses -Consider arterial Dopplers  Metabolic causes: -BMP to check electrolytes, A1c to check for diabetes, B12 ordered  Concern for increased intracranial pressure: Patient reports she snores, Mallampati score 3-4.  -Obtain sleep study  Intracranial mass Normal neurological exam. -If not improving consider CT head versus MRI  Neuropathy Consider gabapentin

## 2020-06-04 ENCOUNTER — Ambulatory Visit (INDEPENDENT_AMBULATORY_CARE_PROVIDER_SITE_OTHER): Payer: Self-pay | Admitting: Family Medicine

## 2020-06-04 ENCOUNTER — Other Ambulatory Visit: Payer: Self-pay

## 2020-06-04 DIAGNOSIS — G589 Mononeuropathy, unspecified: Secondary | ICD-10-CM

## 2020-06-04 NOTE — Patient Instructions (Addendum)
It was a pleasure to meet you today!  I am sorry that you are having these issues with your thumb.  I recommend continuing what you are already doing including wearing your hand brace which adds a little cushion to that area.  He can also continue to take Tylenol as needed.  Grip strength exercises would be beneficial to help build up muscle in your hand to protect that nerve.  If you have worsening signs or symptoms, loss of function of the thumb, any other concerns feel free to call our clinic.  I hope you have a wonderful afternoon!  Pinched Nerve A pinched nerve is an injury that occurs when too much pressure is placed on a nerve. This pressure can cause pain, burning, and muscle weakness in places that the nerve supplies feeling to, such as an arm, hand, or leg, or the back or neck. If a nerve is severely pinched or has been pinched for a long time, permanent nerve damage can occur. What are the causes? This condition may be caused by:  A nerve passing through a narrow area between bones or other body structures.  Arthritis that causes bones to press on a nerve.  Loss of blood supply to a nerve.  A nerve being stretched from an injury.  A sudden injury with swelling.  Long-term wear on the nerve.  Age-related changes in the spine. What are the signs or symptoms? The most common symptoms of a pinched nerve are feeling a tingling sensation and numbness. Other symptoms include:  Pain that spreads from one area of the body part to another.  A burning feeling.  Muscle weakness. How is this diagnosed? This condition may be diagnosed based on:  A physical exam. During the exam, your health care provider will: ? Check for numbness and muscle weakness. ? Move affected body parts to test for pain.  X-rays to check for bone damage.  A MRI or CT scan to check for conditions that may be causing nerve damage.  A muscle test (electromyogram, EMG) to evaluate how your muscles and nerves  communicate. How is this treated?     A pinched nerve is usually treated first by:  Resting the affected body area.  Using devices to help you move without pain (supportive or protective devices), such as a splint, brace, or neck collar. Other treatments depend on your symptoms and the amount of nerve damage you have. Other treatments may include:  Medicines, such as: ? Injections of numbing medicine. ? NSAIDs. ? Pain medicines. ? Steroid medicines. These may be given as a pill or as an injection.  Physical therapy to relieve pain, maintain movement, and improve muscle strength.  Surgery. This may be done if other treatments do not work. Follow these instructions at home:      Take over-the-counter and prescription medicines only as told by your health care provider.  Wear supportive or protective devices as told by your health care provider.  Do physical therapy exercises as directed.  Ask your health care provider what activities are safe for you.  Rest as needed.  If directed, put ice on the affected area: ? Put ice in a plastic bag. ? Place a towel between your skin and the bag. ? Leave the ice on for 20 minutes, 2-3 times a day.  If directed, apply heat to the affected area. Use the heat source that your health care provider recommends, such as a moist heat pack or a heating pad. ?  Place a towel between your skin and the heat source. ? Leave the heat on for 20-30 minutes. ? Remove the heat if your skin turns bright red. This is especially important if you are unable to feel pain, heat, or cold. You may have a greater risk of getting burned.  Do not drive or use heavy machinery while taking prescription pain medicine.  Keep all follow-up visits as told by your health care provider. This is important. Contact a health care provider if:  Your condition does not improve with treatment.  Your pain, numbness, or weakness suddenly gets worse. Get help right away if  you:  Have loss of bladder control (urinary incontinence) or you cannot urinate.  Cannot control bowel movements (fecal incontinence).  Have new weakness in your arms or legs. Summary  A pinched nerve is an injury that occurs when too much pressure is placed on a nerve.  This pressure can cause pain, burning, and muscle weakness in places that the nerve supplies feeling to, such as an arm, hand, or leg, or the back or neck.  Take over-the-counter and prescription medicines only as told by your health care provider.  Ask your health care provider what activities are safe for you while you are having symptoms. This information is not intended to replace advice given to you by your health care provider. Make sure you discuss any questions you have with your health care provider. Document Revised: 11/09/2017 Document Reviewed: 11/06/2017 Elsevier Patient Education  2020 [employer].

## 2020-06-04 NOTE — Progress Notes (Signed)
    SUBJECTIVE:   CHIEF COMPLAINT / HPI:   Right thumb pain Patient reports continued numbness and sharp pain in her right thumb.  Notices it more frequently when mopping or sweeping.  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  Reports that this occurs irregularly and may have been 1-2 times a day.  Has noticed is gotten worse over the last 2 weeks.  Does have a glove which she says helps some to decrease the frequency.  Is concerned that she may have to have some form of procedure to help with this issue.  Has also taken Tylenol which she says helps with the soreness.   OBJECTIVE:   BP (!) 134/82   Pulse 63   Wt (!) 210 lb 3.2 oz (95.3 kg)   LMP 03/02/2012   SpO2 98%   BMI 38.45 kg/m   General: Well-appearing, no acute distress, sitting comfortably in chair next to exam table Cardiac: Regular rate and rhythm, no murmurs appreciated Respiratory: Normal work of breathing MSK: Patient has equal grip strength bilaterally, on point palpation of right thumb able to elicit "electrical shock" feeling, no decreased range of motion and right thumb compared to left.  No upper extremity weakness noted  ASSESSMENT/PLAN:   Pinched nerve Patient has symptoms consistent with median nerve compression at the base of the thumb.  This is reproducible.  It improves with protection such as using a glove or cushion at the junction.  Tylenol helps decrease the irritation and pain. -Discussed hand strengthening exercises -Encouraged patient to continue to wear gloves or some form of protection in that area -Continue Tylenol as needed for irritation and pain -Strict return precautions given regarding persistent loss of sensation or function in the thumb, erythema warmth     Gifford Shave, MD [place]

## 2020-06-07 DIAGNOSIS — G589 Mononeuropathy, unspecified: Secondary | ICD-10-CM | POA: Insufficient documentation

## 2020-06-07 NOTE — Assessment & Plan Note (Signed)
Patient has symptoms consistent with median nerve compression at the base of the thumb.  This is reproducible.  It improves with protection such as using a glove or cushion at the junction.  Tylenol helps decrease the irritation and pain. -Discussed hand strengthening exercises -Encouraged patient to continue to wear gloves or some form of protection in that area -Continue Tylenol as needed for irritation and pain -Strict return precautions given regarding persistent loss of sensation or function in the thumb, erythema warmth

## 2020-06-08 ENCOUNTER — Ambulatory Visit (HOSPITAL_BASED_OUTPATIENT_CLINIC_OR_DEPARTMENT_OTHER): Payer: Self-pay | Attending: Family Medicine | Admitting: Internal Medicine

## 2020-06-08 ENCOUNTER — Other Ambulatory Visit: Payer: Self-pay

## 2020-06-08 VITALS — Ht 62.0 in | Wt 210.0 lb

## 2020-06-08 DIAGNOSIS — R0683 Snoring: Secondary | ICD-10-CM | POA: Insufficient documentation

## 2020-06-08 DIAGNOSIS — R5383 Other fatigue: Secondary | ICD-10-CM | POA: Insufficient documentation

## 2020-06-08 DIAGNOSIS — I493 Ventricular premature depolarization: Secondary | ICD-10-CM | POA: Insufficient documentation

## 2020-06-11 ENCOUNTER — Ambulatory Visit (HOSPITAL_COMMUNITY)
Admission: EM | Admit: 2020-06-11 | Discharge: 2020-06-11 | Disposition: A | Discharge status: Home / Self Care | Payer: No Typology Code available for payment source | Attending: Family Medicine | Admitting: Family Medicine

## 2020-06-11 ENCOUNTER — Other Ambulatory Visit: Payer: Self-pay

## 2020-06-11 ENCOUNTER — Encounter (HOSPITAL_COMMUNITY): Payer: Self-pay

## 2020-06-11 DIAGNOSIS — M545 Low back pain, unspecified: Secondary | ICD-10-CM

## 2020-06-11 LAB — POCT URINALYSIS DIPSTICK, ED / UC
Bilirubin Urine: NEGATIVE
Glucose, UA: NEGATIVE mg/dL
Hgb urine dipstick: NEGATIVE
Ketones, ur: NEGATIVE mg/dL
Leukocytes,Ua: NEGATIVE
Nitrite: NEGATIVE
Protein, ur: NEGATIVE mg/dL
Specific Gravity, Urine: >=1.03 (ref 1.005–1.030)
Urobilinogen, UA: 0.2 mg/dL (ref 0.0–1.0)
pH: 5.5 (ref 5.0–8.0)

## 2020-06-11 MED ORDER — IBUPROFEN 800 MG PO TABS
800.0000 mg | ORAL_TABLET | Freq: Three times a day (TID) | ORAL | 0 refills | Status: DC
Start: 2020-06-11 — End: 2021-07-09

## 2020-06-11 MED ORDER — CYCLOBENZAPRINE HCL 5 MG PO TABS: 5.0000 mg | ORAL_TABLET | Freq: Two times a day (BID) | ORAL | 0 refills | Status: DC | PRN

## 2020-06-11 NOTE — ED Triage Notes (Signed)
Pt is here with lower left back pain that started 2 weeks ago, pt also mentions she is urinating more too. Pt has taken Tylenol to relieve discomfort.

## 2020-06-11 NOTE — Discharge Instructions (Signed)
Urine normal Use anti-inflammatories for pain/swelling. You may take up to 800 mg Ibuprofen every 8 hours with food. You may supplement Ibuprofen with Tylenol [phone] mg every 8 hours.  You may use flexeril as needed to help with pain. This is a muscle relaxer and causes sedation- please use only at bedtime or when you will be home and not have to drive/work Gentle stretching Alternate ice and heat Follow up if not improving or worsening

## 2020-06-12 NOTE — ED Provider Notes (Signed)
[facility]    CSN: [id number] Arrival date & time: 06/11/20  1755      History   Chief Complaint Chief Complaint  Patient presents with  . Back Pain    HPI Elexa Kivi is a 49 y.o. female presenting today for evaluation of back pain. Patient reports that she has had left lower back pain for approximately 2 weeks. Denies any injury fall or trauma to back. Does report she has been lifting and taking care of her grandson of recently. She has noticed increased urinary frequency, but denies dysuria, incomplete voiding or urgency. Denies history of UTI or kidney stones. She does report increased discomfort with certain movements and positions. Using Tylenol without relief. Denies saddle anesthesia. Denies numbness or tingling. Denies loss of bowel/bladder control.  HPI  Past Medical History:  Diagnosis Date  . Abnormal Pap smear   . Allergy    latex  . GERD (gastroesophageal reflux disease)   . Migraines    otc meds prn  . Plantar fasciitis, bilateral   . Seasonal allergies   . Shortness of breath    with exercise - smoker  . Shoulder pain, right    otc meds  . 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 Snoring 06/08/2020  . Pinched nerve 06/07/2020  . 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  . Extremity numbness 10/24/2019  . Shortness of breath 10/23/2019  . Lateral epicondylitis 10/08/2019  . Fatigue 10/08/2019  . Screening breast examination 10/07/2019  . Triceps tendonitis 07/21/2019  . Low back pain 06/18/2019  . Chronic sinusitis 10/18/2018  . Benign paroxysmal positional vertigo 10/18/2018  . Vaginal wall prolapse 07/29/2017  . Migraines 07/27/2017  . GERD (gastroesophageal reflux disease) 07/29/2014  . OBESITY, NOS 01/03/2007    Past Surgical History:  Procedure Laterality Date  . ABDOMINAL HYSTERECTOMY    . BLADDER SUSPENSION  2009  . DIAGNOSTIC LAPAROSCOPY      ectopic pregnancy  . DILATION AND CURETTAGE OF UTERUS     hx mab  . SVD     x 2  . TUBAL LIGATION  1999  . VAGINAL HYSTERECTOMY  03/13/2012   Procedure: HYSTERECTOMY VAGINAL;  Surgeon: Mora Bellman, MD;  Location: [facility];  Service: Gynecology;  Laterality: N/A;    OB History    Gravida  4   Para  2   Term  2   Preterm      AB  2   Living  2     SAB  1   TAB      Ectopic  1   Multiple      Live Births               Home Medications    Prior to Admission medications   Medication Sig Start Date End Date Taking? Authorizing Provider  acetaminophen (TYLENOL 8 HOUR) 650 MG CR tablet Take 2 tablets (1,300 mg total) by mouth every 8 (eight) hours as needed for pain. 06/16/19   Lockamy, Christia Reading, DO  albuterol (VENTOLIN HFA) 108 (90 Base) MCG/ACT inhaler Inhale 1 puff into the lungs every 4 (four) hours as needed for wheezing or shortness of breath. 10/23/19   Kathrene Alu, MD  aspirin-acetaminophen-caffeine (EXCEDRIN MIGRAINE) [phone] MG per tablet Take 1 tablet by mouth every 6 (six) hours as needed for pain.    [provider]  cyclobenzaprine (  FLEXERIL) 5 MG tablet Take 1-2 tablets (5-10 mg total) by mouth 2 (two) times daily as needed for muscle spasms. 06/11/20   Adhrit Krenz C, PA-C  ibuprofen (ADVIL) 800 MG tablet Take 1 tablet (800 mg total) by mouth 3 (three) times daily. 06/11/20   Patrisia Faeth C, PA-C  fluticasone (FLONASE) 50 MCG/ACT nasal spray Place 2 sprays into both nostrils daily. 05/29/19 06/11/20  Guadalupe Dawn, MD  omeprazole (PRILOSEC OTC) 20 MG tablet Take 20 mg by mouth daily.  06/11/20  [provider]  sodium chloride (OCEAN) 0.65 % SOLN nasal spray Place 2 sprays into both nostrils as needed for congestion.  06/11/20  [provider]    Family History Family History  Problem Relation Age of Onset  . Cancer Father   . Diabetes Maternal Grandmother   . Other Mother        balance issues from a "thing in her  brain" requires walker    Social History Social History   Tobacco Use  . Smoking status: Former Smoker    Packs/day: 1.00    Years: 34.00    Pack years: 34.00    Types: Cigarettes    Quit date: 09/04/2015    Years since quitting: 4.7  . Smokeless tobacco: Never Used  Vaping Use  . Vaping Use: Former  Substance Use Topics  . Alcohol use: Yes    Alcohol/week: 2.0 standard drinks    Types: 2 Standard drinks or equivalent per week    Comment: socially, 3x per week  . Drug use: No     Allergies   Latex, Hydrocodone-acetaminophen, and Oxycodone-acetaminophen   Review of Systems Review of Systems  Constitutional: Negative for fatigue and fever.  Eyes: Negative for visual disturbance.  Respiratory: Negative for shortness of breath.   Cardiovascular: Negative for chest pain.  Gastrointestinal: Negative for abdominal pain, nausea and vomiting.  Genitourinary: Positive for frequency. Negative for decreased urine volume, difficulty urinating and dysuria.  Musculoskeletal: Positive for back pain and myalgias. Negative for arthralgias and joint swelling.  Skin: Negative for color change, rash and wound.  Neurological: Negative for dizziness, weakness, light-headedness and headaches.     Physical Exam Triage Vital Signs ED Triage Vitals  Enc Vitals Group     BP 06/11/20 1819 114/71     Pulse Rate 06/11/20 1819 72     Resp 06/11/20 1819 18     Temp 06/11/20 1819 98.4 F (36.9 C)     Temp Source 06/11/20 1819 Oral     SpO2 06/11/20 1819 99 %     Weight --      Height --      Head Circumference --      Peak Flow --      Pain Score 06/11/20 1816 5     Pain Loc --      Pain Edu? --      Excl. in [place]? --    No data found.  Updated Vital Signs BP 114/71 (BP Location: Right Arm)   Pulse 72   Temp 98.4 F (36.9 C) (Oral)   Resp 18   LMP 03/02/2012   SpO2 99%   Visual Acuity Right Eye Distance:   Left Eye Distance:   Bilateral Distance:    Right Eye Near:   Left  Eye Near:    Bilateral Near:     Physical Exam Vitals and nursing note reviewed.  Constitutional:      Appearance: She is well-developed.  Comments: No acute distress  HENT:     Head: Normocephalic and atraumatic.     Nose: Nose normal.  Eyes:     Conjunctiva/sclera: Conjunctivae normal.  Cardiovascular:     Rate and Rhythm: Normal rate.  Pulmonary:     Effort: Pulmonary effort is normal. No respiratory distress.     Comments: Breathing comfortably at rest, CTABL, no wheezing, rales or other adventitious sounds auscultated Abdominal:     General: There is no distension.  Musculoskeletal:        General: Normal range of motion.     Cervical back: Neck supple.     Comments: Nontender to palpation of lumbar spine midline, tenderness to palpation to lower lumbar musculature on left side extending into superior gluteal area  Strength at hips and knees 5/5 and equal bilaterally, patellar reflex 2+ bilaterally gait without abnormality   Skin:    General: Skin is warm and dry.  Neurological:     Mental Status: She is alert and oriented to person, place, and time.      UC Treatments / Results  Labs (all labs ordered are listed, but only abnormal results are displayed) Labs Reviewed  POCT URINALYSIS DIPSTICK, ED / UC    EKG   Radiology No results found.  Procedures Procedures (including critical care time)  Medications Ordered in UC Medications - No data to display  Initial Impression / Assessment and Plan / UC Course  I have reviewed the triage vital signs and the nursing notes.  Pertinent labs & imaging results that were available during my care of the patient were reviewed by me and considered in my medical decision making (see chart for details).     UA unremarkable without hemoglobin, negative leuks and nitrites. Do not suspect underlying stone or infection. Suspect likely muscular etiology and treating as strain. Recommending anti-inflammatories and muscle  relaxers, activity modification. No red flags for cauda equina.  Discussed strict return precautions. Patient verbalized understanding and is agreeable with plan.  Final Clinical Impressions(s) / UC Diagnoses   Final diagnoses:  Acute left-sided low back pain without sciatica     Discharge Instructions     Urine normal Use anti-inflammatories for pain/swelling. You may take up to 800 mg Ibuprofen every 8 hours with food. You may supplement Ibuprofen with Tylenol [phone] mg every 8 hours.  You may use flexeril as needed to help with pain. This is a muscle relaxer and causes sedation- please use only at bedtime or when you will be home and not have to drive/work Gentle stretching Alternate ice and heat Follow up if not improving or worsening   ED Prescriptions    Medication Sig Dispense Auth. Provider   ibuprofen (ADVIL) 800 MG tablet Take 1 tablet (800 mg total) by mouth 3 (three) times daily. 21 tablet Tamme Mozingo C, PA-C   cyclobenzaprine (FLEXERIL) 5 MG tablet Take 1-2 tablets (5-10 mg total) by mouth 2 (two) times daily as needed for muscle spasms. 24 tablet Sharlet Notaro, [place] C, PA-C     PDMP not reviewed this encounter.   Janith Lima, [place] 06/12/20 [phone]

## 2020-06-26 DIAGNOSIS — R0683 Snoring: Secondary | ICD-10-CM

## 2020-06-26 NOTE — Procedures (Signed)
   Patient Name: Deanna Scott, Deanna Scott Date: 06/08/2020 Gender: Female D.O.B: 1971-07-26 Age (years): 31 Referring Provider: Martyn Malay Height (inches): 15 Interpreting Physician: Baird Lyons MD, ABSM Weight (lbs): 210 RPSGT: Carolin Coy BMI: 38 MRN: [id number] Neck Size: 14.50  CLINICAL INFORMATION Sleep Study Type: NPSG Indication for sleep study: Fatigue, Morning Headaches, Obesity, Snoring Epworth Sleepiness Score: 3  SLEEP STUDY TECHNIQUE As per the AASM Manual for the Scoring of Sleep and Associated Events v2.3 (April 2016) with a hypopnea requiring 4% desaturations.  The channels recorded and monitored were frontal, central and occipital EEG, electrooculogram (EOG), submentalis EMG (chin), nasal and oral airflow, thoracic and abdominal wall motion, anterior tibialis EMG, snore microphone, electrocardiogram, and pulse oximetry.  MEDICATIONS Medications self-administered by patient taken the night of the study : none reported  SLEEP ARCHITECTURE The study was initiated at 10:56:40 PM and ended at 5:03:00 AM.  Sleep onset time was 34.4 minutes and the sleep efficiency was 83.1%%. The total sleep time was 304.5 minutes.  Stage REM latency was 126.5 minutes.  The patient spent 19.5%% of the night in stage N1 sleep, 55.2%% in stage N2 sleep, 8.0%% in stage N3 and 17.2% in REM.  Alpha intrusion was absent.  Supine sleep was 30.13%.  RESPIRATORY PARAMETERS The overall apnea/hypopnea index (AHI) was 3.7 per hour. There were 2 total apneas, including 2 obstructive, 0 central and 0 mixed apneas. There were 17 hypopneas and 131 RERAs.  The AHI during Stage REM sleep was 13.7 per hour.  AHI while supine was 12.4 per hour.  The mean oxygen saturation was 93.5%. The minimum SpO2 during sleep was 88.0%.  moderate snoring was noted during this study.  CARDIAC DATA The 2 lead EKG demonstrated sinus rhythm. The mean heart rate was 61.2 beats per minute. Other EKG  findings include: PVCs.  LEG MOVEMENT DATA The total PLMS were 0 with a resulting PLMS index of 0.0. Associated arousal with leg movement index was 0.2 .  IMPRESSIONS - No significant obstructive sleep apnea occurred during this study (AHI = 3.7/h). - No significant central sleep apnea occurred during this study (CAI = 0.0/h). - The patient had minimal oxygen desaturation during the study (Min O2 = 88.0%). Mean sat 93.6% - The patient snored with moderate snoring volume. - EKG findings include PVCs. - Clinically significant periodic limb movements did not occur during sleep. No significant associated arousals.  DIAGNOSIS - Primary Snoring  RECOMMENDATIONS - Manage for symptoms and snoring based on clinical judgment. - Be carefull with alcohol, sedatives and other CNS depressants that may worsen sleep apnea and disrupt normal sleep architecture. - Sleep hygiene should be reviewed to assess factors that may improve sleep quality. - Weight management and regular exercise should be initiated or continued if appropriate.  [Electronically signed] 06/26/2020 11:38 AM  Baird Lyons MD, ABSM Diplomate, American Board of Sleep Medicine   NPI: [id number]                          [place] [place] [facility]  ELECTRONICALLY SIGNED ON:  06/26/2020, 11:33 AM [place] PH: [phone]   FX: [phone] [place]

## 2020-06-29 ENCOUNTER — Encounter: Payer: Self-pay | Admitting: Family Medicine

## 2020-08-05 ENCOUNTER — Other Ambulatory Visit: Payer: Self-pay | Admitting: Family Medicine

## 2020-08-05 ENCOUNTER — Telehealth: Payer: Self-pay

## 2020-08-05 DIAGNOSIS — Z1211 Encounter for screening for malignant neoplasm of colon: Secondary | ICD-10-CM

## 2020-08-05 NOTE — Telephone Encounter (Signed)
Pt informed and given phone number to [facility]. Ottis Stain, CMA

## 2020-08-05 NOTE — Telephone Encounter (Signed)
Patient calls nurse line stating she needs a new referral for GI for colon cancer screening. Last referral was placed July 2020, however she never established.

## 2020-08-05 NOTE — Telephone Encounter (Signed)
Referral placed for colon cancer screening.  If she has any questions or concerns she will need to make an appointment.

## 2020-08-16 ENCOUNTER — Ambulatory Visit: Payer: No Typology Code available for payment source | Admitting: Family Medicine

## 2020-08-26 ENCOUNTER — Encounter: Payer: Self-pay | Admitting: Gastroenterology

## 2020-10-12 ENCOUNTER — Other Ambulatory Visit: Payer: Self-pay

## 2020-10-12 ENCOUNTER — Ambulatory Visit (AMBULATORY_SURGERY_CENTER): Payer: Self-pay | Admitting: *Deleted

## 2020-10-12 VITALS — Ht 62.0 in | Wt 216.4 lb

## 2020-10-12 DIAGNOSIS — Z1211 Encounter for screening for malignant neoplasm of colon: Secondary | ICD-10-CM

## 2020-10-12 MED ORDER — PLENVU 140 G PO SOLR: 1.0000 | ORAL | 0 refills | Status: DC

## 2020-10-12 NOTE — Progress Notes (Signed)
Patient denies any allergies to egg or soy products. Patient denies complications with anesthesia/sedation.  Patient denies oxygen use at home and denies diet medications. Emmi instructions for colonoscopy explained and given to patient. Patient had both covid vaccinations, last one on 07/27/20.

## 2020-10-18 ENCOUNTER — Encounter: Payer: No Typology Code available for payment source | Admitting: Internal Medicine

## 2020-10-19 ENCOUNTER — Other Ambulatory Visit: Payer: Self-pay

## 2020-10-19 ENCOUNTER — Encounter: Payer: Self-pay | Admitting: Gastroenterology

## 2020-10-19 ENCOUNTER — Ambulatory Visit (AMBULATORY_SURGERY_CENTER): Payer: Self-pay | Admitting: Gastroenterology

## 2020-10-19 VITALS — BP 107/71 | HR 62 | Temp 97.7°F | Resp 14 | Ht 62.0 in | Wt 216.4 lb

## 2020-10-19 DIAGNOSIS — Z1211 Encounter for screening for malignant neoplasm of colon: Secondary | ICD-10-CM

## 2020-10-19 DIAGNOSIS — D123 Benign neoplasm of transverse colon: Secondary | ICD-10-CM

## 2020-10-19 DIAGNOSIS — K635 Polyp of colon: Secondary | ICD-10-CM

## 2020-10-19 DIAGNOSIS — D124 Benign neoplasm of descending colon: Secondary | ICD-10-CM

## 2020-10-19 MED ORDER — SODIUM CHLORIDE 0.9 % IV SOLN: 500.0000 mL | Freq: Once | INTRAVENOUS | Status: DC

## 2020-10-19 NOTE — Progress Notes (Signed)
PT taken to PACU. Monitors in place. VSS. Report given to RN. 

## 2020-10-19 NOTE — Progress Notes (Signed)
AR - Check-in  CW - VS  No problems noted in the recovery room. Maw

## 2020-10-19 NOTE — Op Note (Signed)
[facility] Patient Name: Deanna Scott Procedure Date: 10/19/2020 8:07 AM MRN: [id number] Endoscopist: [place]. Loletha Carrow , MD Age: 49 Referring MD:  Date of Birth: 09/02/1971 Gender: Female Account #: [id number] Procedure:                Colonoscopy Indications:              Screening for colorectal malignant neoplasm, This                            is the patient's first colonoscopy Medicines:                Monitored Anesthesia Care Procedure:                Pre-Anesthesia Assessment:                           - Prior to the procedure, a History and Physical                            was performed, and patient medications and                            allergies were reviewed. The patient's tolerance of                            previous anesthesia was also reviewed. The risks                            and benefits of the procedure and the sedation                            options and risks were discussed with the patient.                            All questions were answered, and informed consent                            was obtained. Prior Anticoagulants: The patient has                            taken no previous anticoagulant or antiplatelet                            agents. ASA Grade Assessment: II - A patient with                            mild systemic disease. After reviewing the risks                            and benefits, the patient was deemed in                            satisfactory condition to undergo the procedure.  After obtaining informed consent, the colonoscope                            was passed under direct vision. Throughout the                            procedure, the patient's blood pressure, pulse, and                            oxygen saturations were monitored continuously. The                            Colonoscope was introduced through the anus and                            advanced to the the  terminal ileum, with                            identification of the appendiceal orifice and IC                            valve. The colonoscopy was performed without                            difficulty. The patient tolerated the procedure                            well. The quality of the bowel preparation was                            excellent. The terminal ileum, ileocecal valve,                            appendiceal orifice, and rectum were photographed.                            The bowel preparation used was Plenvu. Scope In: 8:13:45 AM Scope Out: 8:28:51 AM Scope Withdrawal Time: 0 hours 12 minutes 14 seconds  Total Procedure Duration: 0 hours 15 minutes 6 seconds  Findings:                 The perianal and digital rectal examinations were                            normal.                           The terminal ileum appeared normal.                           Two semi-sessile polyps with mucus cap were found                            in the descending colon and transverse colon. The  polyps were 6 to 10 mm in size. These polyps were                            removed with a cold snare. Resection and retrieval                            were complete.                           Multiple diverticula were found in the left colon.                           The exam was otherwise without abnormality on                            direct and retroflexion views. Complications:            No immediate complications. Estimated Blood Loss:     Estimated blood loss was minimal. Impression:               - The examined portion of the ileum was normal.                           - Two 6 to 10 mm polyps in the descending colon and                            in the transverse colon, removed with a cold snare.                            Resected and retrieved.                           - Diverticulosis in the left colon.                           - The  examination was otherwise normal on direct                            and retroflexion views. Recommendation:           - Patient has a contact number available for                            emergencies. The signs and symptoms of potential                            delayed complications were discussed with the                            patient. Return to normal activities tomorrow.                            Written discharge instructions were provided to the  patient.                           - Resume previous diet.                           - Continue present medications.                           - Await pathology results.                           - Repeat colonoscopy is recommended for                            surveillance. The colonoscopy date will be                            determined after pathology results from today's                            exam become available for review. Kayceon Oki L. Loletha Carrow, MD 10/19/2020 8:32:45 AM This report has been signed electronically.

## 2020-10-19 NOTE — Progress Notes (Signed)
Called to room to assist during endoscopic procedure.  Patient ID and intended procedure confirmed with present staff. Received instructions for my participation in the procedure from the performing physician.  

## 2020-10-19 NOTE — Patient Instructions (Signed)
Discharge instructions given. Handouts on polyps and Diverticulosis. Resume previous medications. YOU HAD AN ENDOSCOPIC PROCEDURE TODAY AT [facility]:   Refer to the procedure report that was given to you for any specific questions about what was found during the examination.  If the procedure report does not answer your questions, please call your gastroenterologist to clarify.  If you requested that your care partner not be given the details of your procedure findings, then the procedure report has been included in a sealed envelope for you to review at your convenience later.  YOU SHOULD EXPECT: Some feelings of bloating in the abdomen. Passage of more gas than usual.  Walking can help get rid of the air that was put into your GI tract during the procedure and reduce the bloating. If you had a lower endoscopy (such as a colonoscopy or flexible sigmoidoscopy) you may notice spotting of blood in your stool or on the toilet paper. If you underwent a bowel prep for your procedure, you may not have a normal bowel movement for a few days.  Please Note:  You might notice some irritation and congestion in your nose or some drainage.  This is from the oxygen used during your procedure.  There is no need for concern and it should clear up in a day or so.  SYMPTOMS TO REPORT IMMEDIATELY:   Following lower endoscopy (colonoscopy or flexible sigmoidoscopy):  Excessive amounts of blood in the stool  Significant tenderness or worsening of abdominal pains  Swelling of the abdomen that is new, acute  Fever of 100F or higher   For urgent or emergent issues, a gastroenterologist can be reached at any hour by calling [phone]. Do not use MyChart messaging for urgent concerns.    DIET:  We do recommend a small meal at first, but then you may proceed to your regular diet.  Drink plenty of fluids but you should avoid alcoholic beverages for 24 hours.  ACTIVITY:  You should plan to take  it easy for the rest of today and you should NOT DRIVE or use heavy machinery until tomorrow (because of the sedation medicines used during the test).    FOLLOW UP: Our staff will call the number listed on your records 48-72 hours following your procedure to check on you and address any questions or concerns that you may have regarding the information given to you following your procedure. If we do not reach you, we will leave a message.  We will attempt to reach you two times.  During this call, we will ask if you have developed any symptoms of COVID 19. If you develop any symptoms (ie: fever, flu-like symptoms, shortness of breath, cough etc.) before then, please call [phone].  If you test positive for Covid 19 in the 2 weeks post procedure, please call and report this information to [place].    If any biopsies were taken you will be contacted by phone or by letter within the next 1-3 weeks.  Please call us at [phone] if you have not heard about the biopsies in 3 weeks.    SIGNATURES/CONFIDENTIALITY: You and/or your care partner have signed paperwork which will be entered into your electronic medical record.  These signatures attest to the fact that that the information above on your After Visit Summary has been reviewed and is understood.  Full responsibility of the confidentiality of this discharge information lies with you and/or your care-partner.

## 2020-10-21 ENCOUNTER — Telehealth: Payer: Self-pay | Admitting: *Deleted

## 2020-10-21 ENCOUNTER — Telehealth: Payer: Self-pay

## 2020-10-21 NOTE — Telephone Encounter (Signed)
Second post procedure follow up call, no answer 

## 2020-10-21 NOTE — Telephone Encounter (Signed)
  Follow up Call-  Call back number 10/19/2020  Post procedure Call Back phone  # [phone] cell  Permission to leave phone message Yes  Some recent data might be hidden     Patient questions:  Do you have a fever, pain , or abdominal swelling? No. Pain Score  0 *  Have you tolerated food without any problems? Yes.    Have you been able to return to your normal activities? Yes.    Do you have any questions about your discharge instructions: Diet   No. Medications  No. Follow up visit  No.  Do you have questions or concerns about your Care? No.  Actions: * If pain score is 4 or above: No action needed, pain <4.  1. Have you developed a fever since your procedure? no  2.   Have you had an respiratory symptoms (SOB or cough) since your procedure? no  3.   Have you tested positive for COVID 19 since your procedure no  4.   Have you had any family members/close contacts diagnosed with the COVID 19 since your procedure?  no   If yes to any of these questions please route to Joylene John, RN and Joella Prince, RN

## 2020-10-25 ENCOUNTER — Encounter: Payer: Self-pay | Admitting: Gastroenterology

## 2020-11-11 ENCOUNTER — Other Ambulatory Visit: Payer: Self-pay

## 2020-11-11 ENCOUNTER — Other Ambulatory Visit (HOSPITAL_COMMUNITY)
Admission: RE | Admit: 2020-11-11 | Discharge: 2020-11-11 | Disposition: A | Discharge status: Home / Self Care | Payer: No Typology Code available for payment source | Source: Ambulatory Visit | Attending: Family Medicine | Admitting: Family Medicine

## 2020-11-11 ENCOUNTER — Encounter: Payer: Self-pay | Admitting: Family Medicine

## 2020-11-11 ENCOUNTER — Ambulatory Visit (INDEPENDENT_AMBULATORY_CARE_PROVIDER_SITE_OTHER): Payer: Self-pay | Admitting: Family Medicine

## 2020-11-11 VITALS — BP 136/82 | HR 78 | Wt 217.2 lb

## 2020-11-11 DIAGNOSIS — B9689 Other specified bacterial agents as the cause of diseases classified elsewhere: Secondary | ICD-10-CM | POA: Insufficient documentation

## 2020-11-11 DIAGNOSIS — N760 Acute vaginitis: Secondary | ICD-10-CM

## 2020-11-11 DIAGNOSIS — N898 Other specified noninflammatory disorders of vagina: Secondary | ICD-10-CM | POA: Insufficient documentation

## 2020-11-11 LAB — POCT WET PREP (WET MOUNT)
Clue Cells Wet Prep Whiff POC: NEGATIVE
Trichomonas Wet Prep HPF POC: ABSENT

## 2020-11-11 MED ORDER — METRONIDAZOLE 500 MG PO TABS: 500.0000 mg | ORAL_TABLET | Freq: Two times a day (BID) | ORAL | 0 refills | Status: AC

## 2020-11-11 MED ORDER — FLUCONAZOLE 150 MG PO TABS: 150.0000 mg | ORAL_TABLET | Freq: Once | ORAL | 0 refills | Status: AC

## 2020-11-11 NOTE — Progress Notes (Signed)
    SUBJECTIVE:   CHIEF COMPLAINT / HPI: Vaginal discharge  Ms. Engelson is a 50 year old female presenting for evaluation of vaginal discharge.  She notes she has had increased vaginal itching and fishy odor for the past week.  No vaginal lesions.  She recently had unprotected intercourse with a new partner, would like STDs checked.  She denies any concurrent dysuria, abdominal pain, back pain.  She is s/p total hysterectomy including removal of cervix.  Denies any recent change in soaps or clothes.  PERTINENT  PMH / PSH: BPPV, migraines, s/p hysterectomy for vaginal wall prolapse  OBJECTIVE:   BP 136/82   Pulse 78   Wt 217 lb 3.2 oz (98.5 kg)   LMP 03/02/2012   SpO2 98%   BMI 39.73 kg/m   General: Alert, NAD HEENT: NCAT, MMM Lungs: No increased WOB  Abdomen: soft, non-tender Ext: Warm, dry Pelvic exam: VULVA: normal appearing vulva with no masses, tenderness or lesions, VAGINA: vaginal discharge - white and creamy.  ASSESSMENT/PLAN:   BV (bacterial vaginosis) Clinical presentation and wet prep consistent with BV.  Rx Flagyl twice daily for 7 days.  Additionally Rx'd Diflucan X1 for posttreatment yeast infection per patient request.  Will follow-up gonorrhea/chlamydia, HIV, and RPR.  Discussed preventative care and condom use with every sexual encounter.    Follow-up if not improving or sooner if worsening  Allayne Stack, DO [facility]

## 2020-11-11 NOTE — Assessment & Plan Note (Addendum)
Clinical presentation and wet prep consistent with BV.  Rx Flagyl twice daily for 7 days.  Additionally Rx'd Diflucan X1 for posttreatment yeast infection per patient request.  Will follow-up gonorrhea/chlamydia, HIV, and RPR.  Discussed preventative care and condom use with every sexual encounter.

## 2020-11-12 LAB — RPR: RPR Ser Ql: NONREACTIVE

## 2020-11-12 LAB — HIV ANTIBODY (ROUTINE TESTING W REFLEX): HIV Screen 4th Generation wRfx: NONREACTIVE

## 2020-11-15 ENCOUNTER — Encounter: Payer: Self-pay | Admitting: Family Medicine

## 2020-11-15 LAB — CERVICOVAGINAL ANCILLARY ONLY
Chlamydia: NEGATIVE
Comment: NEGATIVE
Comment: NORMAL
Neisseria Gonorrhea: NEGATIVE

## 2021-02-01 ENCOUNTER — Encounter: Payer: Self-pay | Admitting: Family Medicine

## 2021-02-01 ENCOUNTER — Ambulatory Visit (INDEPENDENT_AMBULATORY_CARE_PROVIDER_SITE_OTHER): Payer: Self-pay | Admitting: Family Medicine

## 2021-02-01 ENCOUNTER — Other Ambulatory Visit: Payer: Self-pay

## 2021-02-01 DIAGNOSIS — R519 Headache, unspecified: Secondary | ICD-10-CM | POA: Insufficient documentation

## 2021-02-01 DIAGNOSIS — G4459 Other complicated headache syndrome: Secondary | ICD-10-CM

## 2021-02-01 NOTE — Assessment & Plan Note (Signed)
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 Headaches described as tightness and pressure throughout head, in addition to overt pain in bilateral temples. No vision changes, photophobia, phonophobia, fevers, or overt neurologic deficits. Headaches last 1-2 days and occur every 1-2 weeks, which is becoming more frequent. Cranial nerve exam was reassuring in clinic, and fundoscopic exam was not overtly concerning. Pt also endorses occasional nausea and lightheadedness.  Plan: Referred pt to our [employer] assistance program  Ordering Brain MRI Discussed return precautions for ED: sudden visual changes or sudden nausea and vomiting Continue Excedrin RTC in 1 month

## 2021-02-01 NOTE — Progress Notes (Signed)
SUBJECTIVE:   CHIEF COMPLAINT / HPI:   Deanna Scott Scott presents today for concerns about progressively worsening headaches over the last year. 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 Last saw ENT Dr. Sabino Snipes in October 2021 who noted concern for IIH and referred the pt for an LP with neurosurgeon Dr. Bridgette Habermann which the pt was unable to get as she does not have insurance.   The pt reports that she has a history of migraines but that her headaches are unlike prior migraines. She describes her recent headaches as a feeling or pressure and tightness proceeding from her occipital region throughout her head, in addition to overt pain in the bilateral temporal areas. These headaches occur every 1-2 weeks, are becoming more frequent, and last 1-2 days alongside Excedrin use. She denies photophobia, phonophobia, auras, vision changes, or fevers. She has also had intermittent nausea without vomiting or diarrhea for the last 2 months. She feels that her headaches have been worsening in severity in the last year as well.  The pt also reports spells of dizziness, feeling as though she is unstable and could fall, but denies any falls. She has experienced vertigo in the past, and denies feeling similarly to that, and denies sensations that the room is spinning or that she is on a boat. Lastly, she notes that she cannot move her head a lot for concern of inciting additional dizziness.   PERTINENT  PMH / PSH:  LCSF Rhinorrhea s/p Endoscopic septoplasty Migraines  OBJECTIVE:   BP 130/67   Pulse 63   Ht 5\' 2"  (1.575 m)   Wt 212 lb 9.6 oz (96.4 kg)   LMP 03/02/2012   SpO2 99%   BMI 38.89 kg/m   Physical Exam Vitals reviewed.  Constitutional:      General: She is not in acute distress.    Appearance: She is not ill-appearing or toxic-appearing.  HENT:     Head:  Normocephalic and atraumatic.     Mouth/Throat:     Mouth: Mucous membranes are moist.     Pharynx: Oropharynx is clear.  Eyes:     General: No visual field deficit.    Extraocular Movements: Extraocular movements intact.     Right eye: No nystagmus.     Left eye: No nystagmus.     Pupils: Pupils are equal, round, and reactive to light.  Cardiovascular:     Rate and Rhythm: Normal rate and regular rhythm.  Pulmonary:     Effort: Pulmonary effort is normal.     Breath sounds: Normal breath sounds.  Skin:    General: Skin is warm and dry.  Neurological:     Mental Status: She is alert and oriented to person, place, and time.     Cranial Nerves: No cranial nerve deficit or facial asymmetry.  Psychiatric:        Mood and Affect: Mood normal.        Behavior: Behavior normal.      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 Headaches described as tightness and pressure throughout head,  in addition to overt pain in bilateral temples. No vision changes, photophobia, phonophobia, fevers, or overt neurologic deficits. Headaches last 1-2 days and occur every 1-2 weeks, which is becoming more frequent. Cranial nerve exam was reassuring in clinic, and fundoscopic exam was not overtly concerning. Pt also endorses occasional nausea and lightheadedness.  Plan: Referred pt to our [employer] assistance program  Ordering Brain MRI Discussed return precautions for ED: sudden visual changes or sudden nausea and vomiting Continue Excedrin RTC in 1 month     [place]     I was present for the physical exam and  medical decision making of this encounter.  I agree with the above documentation with the following additions:  Headache [name] has been experiencing these headaches for roughly 1 year.  The seem to be increasing slightly in frequency/severity.  Her history is not consistent with migraine or cluster type headaches.  It is possible this is a tension type headache although her history is concerning for more concerning processes like idiopathic intracranial hypertension.  She was last seen by ENT in November of this past year who expressed concern that this could be IIH.  Since that visit, she has not had any further work-up or treatment performed.  She has never had any head imaging.  Today, we will start our work-up by moving forward with head imaging with an MRI brain without contrast.  This may be delayed slightly by financial reasons.  Once this is head imaging is resulted, we can move forward with further steps such as possible diagnostic lumbar puncture, referral to neurology or medication. -MRI brain ordered -CMA to follow-up scheduling of the MRI brain -Patient to fill out paperwork for [facility] financial assistance. -MRI brain will likely be delayed by financial reasons. -Pain well controlled with Excedrin for now

## 2021-02-01 NOTE — Patient Instructions (Signed)
It was great to see you today.  Here is a quick review of the things we talked about:  Headaches: I am sorry that you have been having these severe headaches for a while.  I think it is important to start working up these headaches to make sure they do not represent something dangerous.  I I do not think that this is an emergency situation.  The first up in your work-up is to get an MRI of your brain.  I am going to put in an order for that today and sent some messages to people to see how we can do that in an affordable manner.  I would like you to come back to see me in clinic in 1 month.  In the meantime, continue taking Excedrin Migraine relief for your headaches.  Please fill out paperwork for the card and return it to our clinic when it is filled out.  I would like you to go to the emergency room if you have worsened vision or worsened nausea with vomiting.

## 2021-02-02 NOTE — Addendum Note (Signed)
Addended by: Grant Ruts on: 02/02/2021 06:08 PM   Modules accepted: Orders

## 2021-02-03 NOTE — Progress Notes (Signed)
Got a call back from [place] from [facility] stating that she passed the information along to her [employer] fast streamer and that she will call the patient will all the information. Salvatore Marvel, CMA

## 2021-02-03 NOTE — Progress Notes (Signed)
No prior auth needed, patient is self pay.  Ok to schedule with [place] imaging.  Phillp Dolores,CMA

## 2021-02-03 NOTE — Progress Notes (Signed)
I made appt for MRI at [facility] for Apr. 9th at 7:00. I informed pt of this and she told me that she doesn't have insurance, but in the system is says that she has Generic Commercial. I am waiting for someone from [place] to call me back with a price for self pay. Salvatore Marvel, CMA

## 2021-02-12 ENCOUNTER — Ambulatory Visit
Admission: RE | Admit: 2021-02-12 | Discharge: 2021-02-12 | Disposition: A | Discharge status: Home / Self Care | Payer: No Typology Code available for payment source | Source: Ambulatory Visit | Attending: Family Medicine | Admitting: Family Medicine

## 2021-02-12 ENCOUNTER — Other Ambulatory Visit: Payer: Self-pay

## 2021-02-12 DIAGNOSIS — G4459 Other complicated headache syndrome: Secondary | ICD-10-CM

## 2021-02-14 ENCOUNTER — Telehealth: Payer: Self-pay

## 2021-02-14 NOTE — Telephone Encounter (Signed)
Patient Deanna Scott on nurse line requesting MRI results. Please advise.

## 2021-02-15 ENCOUNTER — Other Ambulatory Visit: Payer: Self-pay | Admitting: Family Medicine

## 2021-02-15 DIAGNOSIS — R519 Headache, unspecified: Secondary | ICD-10-CM

## 2021-02-15 DIAGNOSIS — G8929 Other chronic pain: Secondary | ICD-10-CM

## 2021-02-15 NOTE — Progress Notes (Signed)
Called and informed that her MRI showed no evidence of masses or hydrocephalus.  She was told that a referral to neurology was placed and she is to get a call in the next week or so.  She did have additional concerns about occasional nausea and dizziness.  She was encouraged to keep her appointment for tomorrow so she could be assessed.

## 2021-02-16 ENCOUNTER — Encounter: Payer: Self-pay | Admitting: Neurology

## 2021-02-16 ENCOUNTER — Other Ambulatory Visit: Payer: Self-pay

## 2021-02-16 ENCOUNTER — Ambulatory Visit (INDEPENDENT_AMBULATORY_CARE_PROVIDER_SITE_OTHER): Payer: Self-pay | Admitting: Family Medicine

## 2021-02-16 ENCOUNTER — Encounter: Payer: Self-pay | Admitting: Family Medicine

## 2021-02-16 DIAGNOSIS — R519 Headache, unspecified: Secondary | ICD-10-CM

## 2021-02-16 MED ORDER — ONDANSETRON HCL 4 MG PO TABS: 4.0000 mg | ORAL_TABLET | Freq: Three times a day (TID) | ORAL | 0 refills | Status: DC | PRN

## 2021-02-16 NOTE — Patient Instructions (Signed)
Headaches and balance issues: I am sorry that you are still having significant symptoms.  I have provided a medication called Zofran which may be helpful for nausea.  Going to send a message to neurologist to see if we can have you seen earlier than June.  I am sorry that this is not happening faster I note the waiting is very frustrating.  If you do notice significantly worsening balance issues or nausea, I recommend that you get seen in the emergency room.  It sounds like you have been experiencing some congestion and sinus fullness for about 4 days, if you think this is extending beyond 10 days, let me know and I can send him in some antibiotics for a sinus infection.

## 2021-02-16 NOTE — Progress Notes (Signed)
    SUBJECTIVE:   CHIEF COMPLAINT / HPI:   Headaches and balance issues Mr. Mcelveen returns to clinic today to follow-up regarding her frequent headaches.  Since her last visit about 2 weeks ago, she has had an MRI that showed no evidence of intracranial masses, hydrocephalus or "brain sagging."  She reports that her headaches have persisted and in the past week she has started noticing mild balance issues.  She specifically denies any issue with seeing the room spinning.  Rather, she notes that she sometimes feels off balance when walking around or even sitting.  Additionally, she has been experiencing some mild nausea without vomiting for the past week.  She is here with her daughter today and they want to know if anything further can be done today.  Would it be possible to prescribe any medications to help with her symptoms even if we do not yet have a diagnosis?  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  She was able to walk around the exam room without using objects to support her. Cardio: Normal S1 and S2, no S3 or S4. Rhythm is regular. No murmurs or rubs.   Pulm: Clear to auscultation bilaterally, no crackles, wheezing, or diminished breath sounds. Normal respiratory effort Abdomen: Bowel sounds normal. Abdomen soft and non-tender.  Extremities: No peripheral edema. Warm/ well perfused.  Strong radial pulses. Neuro: Cranial nerves grossly intact.  Extraocular muscles intact.  No evidence of nystagmus.  Finger-to-nose testing normal.  Heel-to-shin testing also normal.  She did have trouble standing straight up with her eyes closed and experienced mild instability although she did not require any support trouble keeping her eyes closed for a total of about  10 seconds.  ASSESSMENT/PLAN:   Headache Recent MRI has, reassuringly, ruled out any tumors or evidence of hydrocephalus.  Ms. Braddy and her daughter were reassured of these findings.  Sadly, we do not yet have a diagnosis.  Intracranial hypertension is still a possibility.  She does have an appointment with neurology other does not until June.  I am concerned that her symptoms may be progressing in the past week.  I will send a note to neurology to ask if the can work her in sooner.  In the meantime, provided red flag symptoms for her to be seen in the emergency room.  I have also provided a short supply of Zofran for her to take for nausea.     Matilde Haymaker, MD [place]

## 2021-02-16 NOTE — Assessment & Plan Note (Addendum)
Recent MRI has, reassuringly, ruled out any tumors or evidence of hydrocephalus.  Deanna Scott and her daughter were reassured of these findings.  Sadly, we do not yet have a diagnosis.  Intracranial hypertension is still a possibility.  She does have an appointment with neurology other does not until June.  I am concerned that her symptoms may be progressing in the past week.  I will send a note to neurology to ask if the can work her in sooner.  In the meantime, provided red flag symptoms for her to be seen in the emergency room.  I have also provided a short supply of Zofran for her to take for nausea.

## 2021-02-17 ENCOUNTER — Other Ambulatory Visit: Payer: Self-pay | Admitting: Family Medicine

## 2021-02-17 DIAGNOSIS — R519 Headache, unspecified: Secondary | ICD-10-CM

## 2021-02-17 NOTE — Progress Notes (Signed)
After reaching out to neurology, neurology noted that they would try to see her sooner and would like her to have formal visit with ophthalmology prior to their visit.  She was referred to ophthalmology today.  I called and updated Deanna Scott.  Deanna Haymaker, MD

## 2021-04-06 ENCOUNTER — Telehealth: Payer: Self-pay | Admitting: *Deleted

## 2021-04-06 NOTE — Telephone Encounter (Signed)
Patient has received her orange card in the mail and is now eligible to see the dentist through [facility].  Please place this referral and I will fax it over to [facility].  Thanks [employer]

## 2021-04-18 NOTE — Progress Notes (Signed)
NEUROLOGY CONSULTATION NOTE  Deanna Scott MRN: [id number] DOB: 1971-02-07  Referring provider: Andrena Mews, MD Primary care provider: Carollee Leitz, MD  Reason for consult:  headache  Assessment/Plan:    Chronic headache Dizziness Pulsatile tinntius History of CSF leak Question increased intracranial hypertension.  Other possibility would be migraine.  Patient currently without insurance.  Testing below recommended and pending with patient would be able to afford it.  Recommended applying for the [facility]   Refer to eye doctor for formal eye exam to evaluate for papilledema Regardless of findings, I would likely still check LP for opening pressure as negative eye exam does not absolutely rule out IIH.  She is currently without insurance.  If this is not feasible, then I would start topiramate (which can treat both headaches/migraines and IIH) f LP negative, would check TSH, CTA or MRA of head and carotid ultrasound for further evaluation of pulsatile tinnitus.  If LP positive for IIH, would check MRV of head and start acetazolamide Follow up after testing.   Subjective:  Deanna Scott is a 50 year old right-handed female who presents for headaches.  History supplemented by family medicine and ENT notes. She is accompanied by her daughter who supplements history.   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  Etiology uncertain but increased intracranial pressure was suspected.  Eye exam for papilledema was negative.  She saw ENT and underwent endoscopic septoplasty and CSF leak repair on 01/13/2020.  She had been doing fine until September-October 2021.  She started experiencing dizziness and left sided pulsatile tinnitus as well as onset of headaches, Headaches are a dull occipital pressure/tightness that may become diffuse.  It is not positional.  She has associated dizziness described as unsteadiness with nausea  but sometimes occurs without the headache.  No visual obscurations.  These are different than her typical migraines.  They became severe in March-April 2022.  She was seen in the ED on 02/12/2021.  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  Headaches and dizziness have improved but still present.  Headaches occur a couple of times a week.  Dizziness occurs 3 to 4 times a week, lasting less than an hour.  She had a sleep study in August 2021 which was normal.     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   Qualifier: Diagnosis of  By: Dianah Field MD, Thomas      PAST SURGICAL HISTORY: Past Surgical History:  Procedure Laterality Date   ABDOMINAL HYSTERECTOMY     BLADDER SUSPENSION  2009   DIAGNOSTIC LAPAROSCOPY     ectopic pregnancy   DILATION AND CURETTAGE OF UTERUS     hx mab   endoscopy nasal sinus surgery  01/13/2020   [facility] - CFS Leak Repair   SACROSPINOUS LIGAMENT FIXATION     posterior repair wtih cysto   SVD     x 2  TUBAL LIGATION  1999   VAGINAL HYSTERECTOMY  03/13/2012   Procedure: HYSTERECTOMY VAGINAL;  Surgeon: Mora Bellman, MD;  Location: [facility];  Service: Gynecology;  Laterality: N/A;    MEDICATIONS: Current Outpatient Medications on File Prior to Visit  Medication Sig Dispense Refill   acetaminophen (TYLENOL 8 HOUR) 650 MG CR tablet Take 2 tablets (1,300 mg total) by mouth every 8 (eight) hours as needed for pain. 30 tablet 0   albuterol (VENTOLIN HFA) 108 (90 Base) MCG/ACT inhaler  Inhale 1 puff into the lungs every 4 (four) hours as needed for wheezing or shortness of breath. (Patient not taking: No sig reported) 6.7 g 2   aspirin-acetaminophen-caffeine (EXCEDRIN MIGRAINE) 250-250-65 MG per tablet Take 1 tablet by mouth every 6 (six) hours as needed for pain.     BLACK CURRANT SEED OIL PO Take by mouth daily.     ELDERBERRY PO Take by mouth daily.     Flaxseed Oil (LINSEED OIL) OIL Take by mouth.     fluticasone (FLONASE) 50 MCG/ACT nasal spray Place 1 spray into both nostrils daily.     ibuprofen (ADVIL) 800 MG tablet Take 1 tablet (800 mg total) by mouth 3 (three) times daily. 21 tablet 0   Multiple Vitamin (MULTIVITAMIN ADULT PO) Take 1 tablet by mouth daily.     omeprazole (PRILOSEC OTC) 20 MG tablet Take by mouth.     ondansetron (ZOFRAN) 4 MG tablet Take 1 tablet (4 mg total) by mouth every 8 (eight) hours as needed for nausea or vomiting. 20 tablet 0   polyethylene glycol powder (GLYCOLAX/MIRALAX) 17 GM/SCOOP powder Take by mouth as needed.     vitamin B-12 (CYANOCOBALAMIN) 500 MCG tablet Take by mouth.     [DISCONTINUED] sodium chloride (OCEAN) 0.65 % SOLN nasal spray Place 2 sprays into both nostrils as needed for congestion.     Current Facility-Administered Medications on File Prior to Visit  Medication Dose Route Frequency Provider Last Rate Last Admin   0.9 %  sodium chloride infusion  500 mL Intravenous Once Danis, Estill Cotta III, MD        ALLERGIES: Allergies  Allergen Reactions   Latex Itching and Dermatitis   Hydrocodone-Acetaminophen Other (See Comments)   Oxycodone-Acetaminophen Other (See Comments)    FAMILY HISTORY: Family History  Problem Relation Age of Onset   Cancer Father    Diabetes Maternal Grandmother    Other Mother        balance issues from a "thing in her brain" requires walker   Colon cancer Mother 57       dx 2019   Rectal cancer Neg Hx    Stomach cancer Neg Hx     Objective:   Vitals:   04/20/21 0842  BP: (!) 136/94   Pulse: 64  SpO2: 98%   General: No acute distress.  Patient appears well-groomed.   Head:  Normocephalic/atraumatic Eyes:  fundi examined but not visualized Neck: supple, no paraspinal tenderness, full range of motion Back: No paraspinal tenderness Heart: regular rate and rhythm Lungs: Clear to auscultation bilaterally. Vascular: No carotid bruits. Neurological Exam: Mental status: alert and oriented to person, place, and time, recent and remote memory intact, fund of knowledge intact, attention and concentration intact, speech fluent and not dysarthric, language intact. Cranial nerves: CN I: not tested CN II: pupils equal, round and reactive to light, visual fields intact CN III, IV, VI:  full range of motion, no nystagmus, no ptosis CN V: facial sensation intact.  CN VII: upper and lower face symmetric CN VIII: hearing intact CN IX, X: gag intact, uvula midline CN XI: sternocleidomastoid and trapezius muscles intact CN XII: tongue midline Bulk & Tone: normal, no fasciculations. Motor:  muscle strength 5/5 throughout Sensation:  Pinprick, temperature and vibratory sensation intact. Deep Tendon Reflexes:  2+ throughout,  toes downgoing.   Finger to nose testing:  Without dysmetria.   Heel to shin:  Without dysmetria.   Gait:  Normal station and stride.  Romberg negative.    Thank you for allowing me to take part in the care of this patient.  Metta Clines, DO  CC: Carollee Leitz, MD

## 2021-04-19 ENCOUNTER — Other Ambulatory Visit: Payer: Self-pay

## 2021-04-19 ENCOUNTER — Ambulatory Visit (INDEPENDENT_AMBULATORY_CARE_PROVIDER_SITE_OTHER): Payer: Self-pay | Admitting: Neurology

## 2021-04-19 ENCOUNTER — Encounter: Payer: Self-pay | Admitting: Neurology

## 2021-04-19 VITALS — BP 136/94 | HR 64 | Ht 64.0 in | Wt 212.8 lb

## 2021-04-19 DIAGNOSIS — R519 Headache, unspecified: Secondary | ICD-10-CM

## 2021-04-19 DIAGNOSIS — R42 Dizziness and giddiness: Secondary | ICD-10-CM

## 2021-04-19 DIAGNOSIS — G9600 Cerebrospinal fluid leak, unspecified: Secondary | ICD-10-CM

## 2021-04-19 DIAGNOSIS — H93A9 Pulsatile tinnitus, unspecified ear: Secondary | ICD-10-CM

## 2021-04-19 NOTE — Patient Instructions (Addendum)
Refer to ophthalmology for evaluation of papilledema (increased pressure behind the eyes) Afterwards, would consider spinal tap to measure intracranial pressure - if getting a spinal tap turns out to not be a feasible option, we can start a medication that treats both chronic headaches/migraines as well as potentially pulling off excessive spinal fluid in the head. Would like to check TSH level  Look into applying for the [facility] to help get imaging performed (such as MRI) Follow up after testing.

## 2021-04-19 NOTE — Telephone Encounter (Signed)
Please place this referral for patient. She needs to have one documented in her chart for orange card to process it.  Thanks [employer]

## 2021-04-20 ENCOUNTER — Telehealth: Payer: Self-pay | Admitting: Neurology

## 2021-04-20 ENCOUNTER — Other Ambulatory Visit: Payer: Self-pay | Admitting: Family Medicine

## 2021-04-20 DIAGNOSIS — K0889 Other specified disorders of teeth and supporting structures: Secondary | ICD-10-CM

## 2021-04-20 NOTE — Telephone Encounter (Signed)
Called and inform patient to allow time for referrals to be sent as we have to wait until documentation is finished and allow time for the receiving doctor of referral to process her referral. Informed patient that if they haven't called her by late next week to give [place] a call to check on referral. Checked to ensure that there is a referral on file and a referral has been sent to [facility] eye care. Patient verbalized understanding and had no questions or concerns.

## 2021-04-20 NOTE — Telephone Encounter (Signed)
Pt called in and left a message stating she was supposed to have a referral to an eye doctor, but she called today and they do not have one for her.

## 2021-04-20 NOTE — Progress Notes (Signed)
Referral sent as requested for dental therapy

## 2021-04-21 NOTE — Progress Notes (Signed)
    SUBJECTIVE:   CHIEF COMPLAINT / HPI:   Request TSH check: Patient states she was requested to come in to check her TSH per her neurologist. She is seeing them after CSF leak last year which has since been repaired. She denies tachycardia or obvious arrythmias, states that she does have low energy from time to time.    PERTINENT  PMH / PSH: Hx CSF leak.  OBJECTIVE:   BP 120/80   Pulse 69   Ht 5\' 4"  (1.626 m)   Wt 211 lb 2 oz (95.8 kg)   LMP 03/02/2012   SpO2 98%   BMI 36.24 kg/m    General: NAD, pleasant, able to participate in exam HEENT: No obvious thyromegaly Cardiac: RRR, no murmurs. Respiratory: CTAB, normal effort, No wheezes, rales or rhonchi Skin: warm and dry, no rashes noted Neuro: alert, no obvious focal deficits Psych: Normal affect and mood  ASSESSMENT/PLAN:   Lab request from other physician Her neurologist requests [place] to check a TSH.  He already has this future lab order.  He also request that we submit the previous MRI that she had because he is unable to view it.  She states that she has no other concerns and that there are no other labs requested for her.  We will order the above lab and will send the MRI to her neurologist.  Patient understands if she has other questions or concerns she can always follow-up with [place].  Lurline Del, [place]

## 2021-04-22 ENCOUNTER — Other Ambulatory Visit: Payer: Self-pay

## 2021-04-22 ENCOUNTER — Ambulatory Visit (INDEPENDENT_AMBULATORY_CARE_PROVIDER_SITE_OTHER): Payer: No Typology Code available for payment source | Admitting: Family Medicine

## 2021-04-22 ENCOUNTER — Encounter: Payer: Self-pay | Admitting: Family Medicine

## 2021-04-22 DIAGNOSIS — R519 Headache, unspecified: Secondary | ICD-10-CM

## 2021-04-22 DIAGNOSIS — G9600 Cerebrospinal fluid leak, unspecified: Secondary | ICD-10-CM

## 2021-04-22 NOTE — Patient Instructions (Signed)
We are working on getting the MRI results to your neurologist.  We have done the TSH today.  We have set it up to where the result should go back to your neurologist so they should be able to see that.  If they are not able to see you or if you have other questions or concerns please let us know.  If you need anything else please let us know

## 2021-04-22 NOTE — Telephone Encounter (Signed)
Referral added and routed via Epic. 04/20/21.  LP added and scheduled for 04/26/21 at 10 am arrive at 9:45 am at [facility].

## 2021-04-23 LAB — TSH: TSH: 1.54 u[IU]/mL (ref 0.450–4.500)

## 2021-04-26 ENCOUNTER — Ambulatory Visit (HOSPITAL_COMMUNITY): Payer: No Typology Code available for payment source

## 2021-05-06 ENCOUNTER — Telehealth: Payer: Self-pay

## 2021-05-06 ENCOUNTER — Ambulatory Visit (HOSPITAL_COMMUNITY)
Admission: RE | Admit: 2021-05-06 | Discharge: 2021-05-06 | Disposition: A | Discharge status: Home / Self Care | Payer: Self-pay | Source: Ambulatory Visit | Attending: Neurology | Admitting: Neurology

## 2021-05-06 DIAGNOSIS — G9600 Cerebrospinal fluid leak, unspecified: Secondary | ICD-10-CM | POA: Insufficient documentation

## 2021-05-06 LAB — CSF CELL COUNT WITH DIFFERENTIAL
RBC Count, CSF: 270 /mm3 — ABNORMAL HIGH
Tube #: 1
WBC, CSF: 2 /mm3 (ref 0–5)

## 2021-05-06 LAB — GLUCOSE, CSF: Glucose, CSF: 70 mg/dL (ref 40–70)

## 2021-05-06 LAB — PROTEIN, CSF: Total  Protein, CSF: 34 mg/dL (ref 15–45)

## 2021-05-06 MED ORDER — LIDOCAINE HCL (PF) 1 % IJ SOLN
5.0000 mL | Freq: Once | INTRAMUSCULAR | Status: AC
  Administered 2021-05-06: 5 mL via INTRADERMAL

## 2021-05-06 NOTE — Telephone Encounter (Signed)
-----   Message from Pieter Partridge, DO sent at 05/06/2021  4:24 PM EDT ----- Spinal tap does not reveal evidence of increased intracranial pressure.  If she agrees, we can start TOPIRAMATE 25MG  AT BEDTIME FOR ONE WEEK, THEN INCREASE TO 50MG  AT BEDTIME to help reduce headache frequency.  Side effects may include numbness and tingling and brain fog.  This doesn't mean she will experience this, but if she does then it typically resolves once her body gets used to the medication.  It may also cause weight loss.  Rarely, it may cause kidney stones, so she should make sure that she keeps hydrated.  If headaches not improved in 5 weeks, she should contact us and we can increase dose.

## 2021-05-06 NOTE — Telephone Encounter (Signed)
Pt advised of her results. Per pt and daughter they want to research the medication and give [place] a call  back.

## 2021-05-06 NOTE — Procedures (Signed)
Successful L3/L4 image-guided lumbar puncture. Opening pressure 22 cm water. 9 ml CSF removed. Patient tolerated procedure well and will  have a 4 hour recovery in short stay. Post-procedure orders placed by Dr. Melanee Spry.  Soyla Dryer, AGACNP-BC [phone] 05/06/2021, 10:21 AM

## 2021-05-09 LAB — CSF CULTURE W GRAM STAIN: Culture: NO GROWTH

## 2021-07-04 ENCOUNTER — Ambulatory Visit: Payer: No Typology Code available for payment source

## 2021-07-09 ENCOUNTER — Encounter (HOSPITAL_COMMUNITY): Payer: Self-pay

## 2021-07-09 ENCOUNTER — Ambulatory Visit (HOSPITAL_COMMUNITY)
Admission: EM | Admit: 2021-07-09 | Discharge: 2021-07-09 | Disposition: A | Discharge status: Home / Self Care | Payer: No Typology Code available for payment source | Attending: Emergency Medicine | Admitting: Emergency Medicine

## 2021-07-09 ENCOUNTER — Ambulatory Visit (INDEPENDENT_AMBULATORY_CARE_PROVIDER_SITE_OTHER): Payer: Self-pay

## 2021-07-09 DIAGNOSIS — R079 Chest pain, unspecified: Secondary | ICD-10-CM

## 2021-07-09 DIAGNOSIS — K219 Gastro-esophageal reflux disease without esophagitis: Secondary | ICD-10-CM

## 2021-07-09 DIAGNOSIS — R059 Cough, unspecified: Secondary | ICD-10-CM

## 2021-07-09 DIAGNOSIS — Z8709 Personal history of other diseases of the respiratory system: Secondary | ICD-10-CM

## 2021-07-09 DIAGNOSIS — R0602 Shortness of breath: Secondary | ICD-10-CM

## 2021-07-09 DIAGNOSIS — R197 Diarrhea, unspecified: Secondary | ICD-10-CM

## 2021-07-09 MED ORDER — ADVAIR HFA 115-21 MCG/ACT IN AERO
2.0000 | INHALATION_SPRAY | Freq: Two times a day (BID) | RESPIRATORY_TRACT | 0 refills | Status: DC
  Filled 2021-07-13: qty 12, 30d supply, fill #0

## 2021-07-09 MED ORDER — OMEPRAZOLE MAGNESIUM 20 MG PO TBEC: 20.0000 mg | DELAYED_RELEASE_TABLET | Freq: Every day | ORAL | 0 refills | Status: DC

## 2021-07-09 MED ORDER — ALBUTEROL SULFATE HFA 108 (90 BASE) MCG/ACT IN AERS: 1.0000 | INHALATION_SPRAY | RESPIRATORY_TRACT | 0 refills | Status: DC | PRN

## 2021-07-09 NOTE — ED Provider Notes (Signed)
[place]    CSN: [id number] Arrival date & time: 07/09/21  1025      History   Chief Complaint Chief Complaint  Patient presents with   Hemoptysis    HPI Deanna Scott is a 50 y.o. female.   Patient complains of 3 weeks of cough that is minimally productive of phlegm.  Patient states that 2 days ago she began to have a sharp chest pain to the left of her medial sternum.  Patient reports history of COPD diagnosed 2 years ago, states she was prescribed a purple inhaler that she was supposed to use daily however it was only a sample and it ran out very quickly.  She currently has an albuterol inhaler with her, states she is not using it "as she should".  Patient states that when she uses the albuterol, which she used this morning, she does not have any relief.  Per EMR, patient also has a history of GERD, not currently taking any medication to control this.  Vital signs are stable today with good oxygen saturation.  Patient states that the sharp pain she is experiencing only lasts a couple seconds and is elicited by certain movements of her upper body.  States that the pain resolves without intervention.  States pain occurs multiple times throughout the day.  Pain is not associated with eating.  Reports feeling dizzy for the past 2 days, adds that she had diarrhea began yesterday morning, states she had it all day yesterday, and has had 3 loose stool movements today.  States she has been trying to drink water to stay hydrated.  The history is provided by the patient.  Cough Cough characteristics:  Unable to specify Sputum characteristics:  Unable to specify Severity:  Mild Onset quality:  Unable to specify Duration:  3 weeks Timing:  Sporadic Progression:  Waxing and waning Chronicity:  Chronic Smoker: no   Relieved by:  Nothing Exacerbated by: Positional changes. 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   Qualifier: Diagnosis of  By: Dianah Field MD, Marcello Moores      Patient Active Problem List   Diagnosis Date Noted   Headache 02/01/2021   BV (bacterial vaginosis) 11/11/2020   Snoring 06/08/2020   Pinched nerve 06/07/2020   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   Extremity numbness 10/24/2019   Shortness of breath 10/23/2019   Lateral epicondylitis 10/08/2019   Fatigue 10/08/2019   Screening breast examination 10/07/2019   Triceps tendonitis 07/21/2019   Low back pain 06/18/2019   Chronic sinusitis 10/18/2018   Benign paroxysmal positional vertigo 10/18/2018   Vaginal wall prolapse 07/29/2017   Migraines 07/27/2017   GERD (gastroesophageal reflux disease) 07/29/2014   OBESITY, NOS 01/03/2007    Past Surgical History:  Procedure Laterality Date   ABDOMINAL HYSTERECTOMY     BLADDER SUSPENSION  2009   DIAGNOSTIC LAPAROSCOPY     ectopic pregnancy   DILATION AND CURETTAGE OF UTERUS     hx mab   endoscopy nasal sinus surgery  01/13/2020  [facility] - CFS Leak Repair   SACROSPINOUS LIGAMENT FIXATION     posterior repair wtih cysto   SVD     x 2   TUBAL LIGATION  1999   VAGINAL HYSTERECTOMY  03/13/2012   Procedure: HYSTERECTOMY VAGINAL;  Surgeon: Mora Bellman, MD;  Location: [facility];  Service: Gynecology;  Laterality: N/A;    OB History     Gravida  4   Para  2   Term  2   Preterm      AB  2   Living  2      SAB  1   IAB      Ectopic  1   Multiple      Live Births               Home Medications    Prior to Admission medications   Medication Sig Start Date End Date Taking? Authorizing Provider  fluticasone-salmeterol (ADVAIR  HFA) 115-21 MCG/ACT inhaler Inhale 2 puffs into the lungs 2 (two) times daily. 07/09/21  Yes Lynden Oxford, PA-C  acetaminophen (TYLENOL 8 HOUR) 650 MG CR tablet Take 2 tablets (1,300 mg total) by mouth every 8 (eight) hours as needed for pain. 06/16/19   Lockamy, Christia Reading, DO  albuterol (VENTOLIN HFA) 108 (90 Base) MCG/ACT inhaler Inhale 1 puff into the lungs every 4 (four) hours as needed for wheezing or shortness of breath. 07/09/21   Lynden Oxford, PA-C  aspirin-acetaminophen-caffeine (EXCEDRIN MIGRAINE) [phone] MG per tablet Take 1 tablet by mouth every 6 (six) hours as needed for pain.    [provider]  BLACK CURRANT SEED OIL PO Take by mouth daily.    [provider]  ELDERBERRY PO Take by mouth daily.    [provider]  fluticasone (FLONASE) 50 MCG/ACT nasal spray Place 1 spray into both nostrils daily. 09/22/20   [provider]  Multiple Vitamin (MULTIVITAMIN ADULT PO) Take 1 tablet by mouth daily. Patient not taking: Reported on 04/19/2021    [provider]  omeprazole (PRILOSEC OTC) 20 MG tablet Take 1 tablet (20 mg total) by mouth daily. 07/09/21 08/08/21  Lynden Oxford, PA-C  polyethylene glycol powder (GLYCOLAX/MIRALAX) 17 GM/SCOOP powder Take by mouth as needed. 01/15/20   [provider]  vitamin B-12 (CYANOCOBALAMIN) 500 MCG tablet Take by mouth.    [provider]  sodium chloride (OCEAN) 0.65 % SOLN nasal spray Place 2 sprays into both nostrils as needed for congestion.  06/11/20  [provider]    Family History Family History  Problem Relation Age of Onset   Cancer Father    Diabetes Maternal Grandmother    Other Mother        balance issues from a "thing in her brain" requires walker   Colon cancer Mother 12       dx 2019   Rectal cancer Neg Hx    Stomach cancer Neg Hx     Social History Social History   Tobacco Use   Smoking status: Former    Packs/day: 1.00    Years: 34.00    Pack  years: 34.00    Types: Cigarettes    Quit date: 09/04/2015    Years since quitting: 5.8   Smokeless tobacco: Never  Vaping Use   Vaping Use: Former  Substance Use Topics   Alcohol use: Yes    Alcohol/week: 2.0 standard drinks    Types: 2 Standard drinks or equivalent per week    Comment: socially  Drug use: No     Allergies   Latex, Hydrocodone-acetaminophen, and Oxycodone-acetaminophen   Review of Systems Review of Systems  Respiratory:  Positive for cough and shortness of breath.   Cardiovascular:  Positive for chest pain (Sharp, intermittent.).  All other systems reviewed and are negative.   Physical Exam Triage Vital Signs ED Triage Vitals  Enc Vitals Group     BP 07/09/21 1100 138/83     Pulse Rate 07/09/21 1100 72     Resp 07/09/21 1100 20     Temp 07/09/21 1100 98.1 F (36.7 C)     Temp Source 07/09/21 1100 Oral     SpO2 07/09/21 1100 97 %     Weight --      Height --      Head Circumference --      Peak Flow --      Pain Score 07/09/21 1058 0     Pain Loc --      Pain Edu? --      Excl. in [place]? --    No data found.  Updated Vital Signs BP 138/83 (BP Location: Left Arm)   Pulse 72   Temp 98.1 F (36.7 C) (Oral)   Resp 20   LMP 03/02/2012   SpO2 97%   Visual Acuity Right Eye Distance:   Left Eye Distance:   Bilateral Distance:    Right Eye Near:   Left Eye Near:    Bilateral Near:     Physical Exam Vitals and nursing note reviewed.  Constitutional:      Appearance: Normal appearance. She is obese.  HENT:     Head: Normocephalic and atraumatic.     Right Ear: Tympanic membrane, ear canal and external ear normal.     Left Ear: Tympanic membrane, ear canal and external ear normal.     Nose: Nose normal.     Mouth/Throat:     Mouth: Mucous membranes are dry.  Eyes:     Extraocular Movements: Extraocular movements intact.     Conjunctiva/sclera: Conjunctivae normal.     Pupils: Pupils are equal, round, and reactive to light.   Cardiovascular:     Rate and Rhythm: Normal rate and regular rhythm.     Heart sounds: Normal heart sounds. No murmur heard.   No friction rub. No gallop.  Pulmonary:     Effort: Pulmonary effort is normal. No respiratory distress.     Breath sounds: Normal breath sounds. No stridor. No wheezing, rhonchi or rales.  Chest:     Chest wall: No tenderness.  Abdominal:     General: Abdomen is flat. Bowel sounds are normal.     Palpations: Abdomen is soft.  Musculoskeletal:        General: Normal range of motion.     Cervical back: Normal range of motion and neck supple.  Skin:    General: Skin is warm and dry.  Neurological:     General: No focal deficit present.     Mental Status: She is alert and oriented to person, place, and time.  Psychiatric:        Mood and Affect: Mood normal.        Behavior: Behavior normal.        Thought Content: Thought content normal.        Judgment: Judgment normal.     UC Treatments / Results  Labs (all labs ordered are listed, but only abnormal results are displayed) Labs Reviewed - No data  to display  EKG   Radiology DG Chest 2 View  Result Date: 07/09/2021 CLINICAL DATA:  Sharp chest pain with cough for 3 weeks, shortness of breath, congestion, history COPD EXAM: CHEST - 2 VIEW COMPARISON:  None FINDINGS: Normal heart size, mediastinal contours, and pulmonary vascularity. Lungs clear. No pleural effusion or pneumothorax. Bones unremarkable. IMPRESSION: Normal exam. Electronically Signed   By: Lavonia Dana M.D.   On: 07/09/2021 12:20    Procedures Procedures (including critical care time)  Medications Ordered in UC Medications - No data to display  Initial Impression / Assessment and Plan / UC Course  I have reviewed the triage vital signs and the nursing notes.  Pertinent labs & imaging results that were available during my care of the patient were reviewed by me and considered in my medical decision making (see chart for details).      Patient today complains of cough, diarrhea and sharp pain in chest.  Patient's past medical history of COPD and gastroesophageal reflux disease, I recommend that she resume medications for these.  I have renewed her prescription for omeprazole 20 mg.  I have renewed her prescription for Advair.  Chest x-ray performed today was unremarkable.  Patient has been advised to follow-up with her primary care physician regarding both of these issues.  Patient was encouraged to only drink electrolyte solutions such as Gatorade or Pedialyte while having diarrhea which should be self-limiting.  Bland diet recommended. Final Clinical Impressions(s) / UC Diagnoses   Final diagnoses:  Cough  Diarrhea, unspecified type  History of COPD  Gastroesophageal reflux disease without esophagitis     Discharge Instructions      Your chest x-ray today was normal.  Given the sharp chest pain that you are having and her history of GERD, I recommend that you resume your prescription for omeprazole.  I have renewed this for you.  Given the duration of your cough which is for the most part not productive of any significant sputum, I recommend that she really resume Advair.  I have also renewed this prescription for you.  Please follow-up with your primary care physician as soon as possible for both of these issues.  While you are having diarrhea, I encourage you to avoid drinking water and to only consume Gatorade or Pedialyte to help replace your sodium and potassium levels.     ED Prescriptions     Medication Sig Dispense Auth. Provider   omeprazole (PRILOSEC OTC) 20 MG tablet Take 1 tablet (20 mg total) by mouth daily. 30 tablet Lynden Oxford, PA-C   albuterol (VENTOLIN HFA) 108 (90 Base) MCG/ACT inhaler Inhale 1 puff into the lungs every 4 (four) hours as needed for wheezing or shortness of breath. 18 g Lynden Oxford, PA-C   fluticasone-salmeterol (ADVAIR [facility]) 115-21 MCG/ACT inhaler Inhale 2 puffs into the  lungs 2 (two) times daily. 8 g Lynden Oxford, PA-C      PDMP not reviewed this encounter.   Lynden Oxford, PA-C 07/09/21 1339

## 2021-07-09 NOTE — ED Triage Notes (Addendum)
Pt present coughing with congestion and SOB. Symptom started three weeks ago. Pt states very difficult to spit mucous up. Pt states her congestion is very discomfort, any movement she has sharp pains in her chest.

## 2021-07-09 NOTE — Discharge Instructions (Addendum)
Your chest x-ray today was normal.  Given the sharp chest pain that you are having and her history of GERD, I recommend that you resume your prescription for omeprazole.  I have renewed this for you.  Given the duration of your cough which is for the most part not productive of any significant sputum, I recommend that she really resume Advair.  I have also renewed this prescription for you.  Please follow-up with your primary care physician as soon as possible for both of these issues.  While you are having diarrhea, I encourage you to avoid drinking water and to only consume Gatorade or Pedialyte to help replace your sodium and potassium levels.

## 2021-07-12 ENCOUNTER — Telehealth: Payer: Self-pay

## 2021-07-12 NOTE — Telephone Encounter (Signed)
Patient calls nurse line due to being unable to afford Advair inhaler prescribed by urgent care. It does not appear that we have any samples of this inhaler.   Will forward to pharmacy team for further recommendations or discount programs.   Talbot Grumbling, RN

## 2021-07-13 ENCOUNTER — Other Ambulatory Visit: Payer: Self-pay

## 2021-07-14 ENCOUNTER — Other Ambulatory Visit: Payer: Self-pay

## 2021-09-05 ENCOUNTER — Ambulatory Visit (INDEPENDENT_AMBULATORY_CARE_PROVIDER_SITE_OTHER): Payer: Self-pay | Admitting: Family Medicine

## 2021-09-05 ENCOUNTER — Other Ambulatory Visit: Payer: Self-pay

## 2021-09-05 ENCOUNTER — Encounter: Payer: Self-pay | Admitting: Family Medicine

## 2021-09-05 VITALS — BP 143/84 | HR 76 | Ht 64.0 in | Wt 223.8 lb

## 2021-09-05 DIAGNOSIS — Z6841 Body Mass Index (BMI) 40.0 and over, adult: Secondary | ICD-10-CM

## 2021-09-05 DIAGNOSIS — E785 Hyperlipidemia, unspecified: Secondary | ICD-10-CM

## 2021-09-05 DIAGNOSIS — R200 Anesthesia of skin: Secondary | ICD-10-CM

## 2021-09-05 DIAGNOSIS — R0602 Shortness of breath: Secondary | ICD-10-CM

## 2021-09-05 DIAGNOSIS — R030 Elevated blood-pressure reading, without diagnosis of hypertension: Secondary | ICD-10-CM

## 2021-09-05 MED ORDER — ADVAIR HFA 115-21 MCG/ACT IN AERO: 2.0000 | INHALATION_SPRAY | Freq: Two times a day (BID) | RESPIRATORY_TRACT | 0 refills | Status: DC

## 2021-09-05 MED ORDER — ALBUTEROL SULFATE HFA 108 (90 BASE) MCG/ACT IN AERS: 1.0000 | INHALATION_SPRAY | RESPIRATORY_TRACT | 0 refills | Status: DC | PRN

## 2021-09-05 MED ORDER — BLOOD PRESSURE KIT: PACK | 0 refills | Status: DC

## 2021-09-05 NOTE — Patient Instructions (Addendum)
Thank you for coming to see me today. It was a pleasure.   We will get some labs today.  If they are abnormal or we need to do something about them, I will call you.  If they are normal, I will send you a message on MyChart (if it is active) or a letter in the mail.  If you don't hear from [place] in 2 weeks, please call the office at the number below.   Monitor blood pressure 2-3 times a week for the next 2 weeks.  Record readings and bring with you to appointment for review.  Continue Advair daily  If blood pressure consistently high, greater than 170/100 please go to Emergency department  Follow up with PCP in 2 weeks  If you have any questions or concerns, please do not hesitate to call the office at [phone].  Best,   Carollee Leitz, MD    Carpal Tunnel Syndrome Carpal tunnel syndrome is a condition that causes pain, weakness, and numbness in your hand and arm. Numbness is when you cannot feel an area in your body. The carpal tunnel is a narrow area that is on the palm side of your wrist. Repeated wrist motion or certain diseases may cause swelling in the tunnel. This swelling can pinch the main nerve in the wrist. This nerve is called the median nerve. What are the causes? This condition may be caused by: Moving your hand and wrist over and over again while doing a task. Injury to the wrist. Arthritis. A sac of fluid (cyst) or abnormal growth (tumor) in the carpal tunnel. Fluid buildup during pregnancy. Use of tools that vibrate. Sometimes the cause is not known. What increases the risk? The following factors may make you more likely to have this condition: Having a job that makes you do these things: Move your hand over and over again. Work with tools that vibrate, such as drills or sanders. Being a woman. Having diabetes, obesity, thyroid problems, or kidney failure. What are the signs or symptoms? Symptoms of this condition include: A tingling feeling in your  fingers. Tingling or loss of feeling in your hand. Pain in your entire arm. This pain may get worse when you bend your wrist and elbow for a long time. Pain in your wrist that goes up your arm to your shoulder. Pain that goes down into your palm or fingers. Weakness in your hands. You may find it hard to grab and hold items. You may feel worse at night. How is this treated? This condition may be treated with: Lifestyle changes. You will be asked to stop or change the activity that caused your problem. Doing exercises and activities that make bones, muscles, and tendons stronger (physical therapy). Learning how to use your hand again (occupational therapy). Medicines for pain and swelling. You may have injections in your wrist. A wrist splint or brace. Surgery. Follow these instructions at home: If you have a splint or brace: Wear the splint or brace as told by your doctor. Take it off only as told by your doctor. Loosen the splint if your fingers: Tingle. Become numb. Turn cold and blue. Keep the splint or brace clean. If the splint or brace is not waterproof: Do not let it get wet. Cover it with a watertight covering when you take a bath or a shower. Managing pain, stiffness, and swelling If told, put ice on the painful area: If you have a removable splint or brace, remove it as told  by your doctor. Put ice in a plastic bag. Place a towel between your skin and the bag. Leave the ice on for 20 minutes, 2-3 times per day. Do not fall asleep with the cold pack on your skin. Take off the ice if your skin turns bright red. This is very important. If you cannot feel pain, heat, or cold, you have a greater risk of damage to the area. Move your fingers often to reduce stiffness and swelling. General instructions Take over-the-counter and prescription medicines only as told by your doctor. Rest your wrist from any activity that may cause pain. If needed, talk with your boss at work about  changes that can help your wrist heal. Do exercises as told by your doctor, physical therapist, or occupational therapist. Keep all follow-up visits. Contact a doctor if: You have new symptoms. Medicine does not help your pain. Your symptoms get worse. Get help right away if: You have very bad numbness or tingling in your wrist or hand. Summary Carpal tunnel syndrome is a condition that causes pain in your hand and arm. It is often caused by repeated wrist motions. Lifestyle changes and medicines are used to treat this problem. Surgery may help in very bad cases. Follow your doctor's instructions about wearing a splint, resting your wrist, keeping follow-up visits, and calling for help. This information is not intended to replace advice given to you by your health care provider. Make sure you discuss any questions you have with your health care provider. Document Revised: 03/04/2020 Document Reviewed: 03/04/2020 Elsevier Patient Education  [place].

## 2021-09-05 NOTE — Progress Notes (Signed)
    SUBJECTIVE:   CHIEF COMPLAINT / HPI:   Elevated BP Recently visiting sister in [place].  Eating out a lot. Sister has new BP machine and checked her BP as she was reporting long term SOB. BP at that time 170's.  She took her sisters BP medication for one day.  Not sure what medication.  Denies any worsening shortness of breath, chest pain, headaches, nausea/vomiting or visual changes.  No slurred speech or facial drooping.  Shortness of breath Ongoaing for 2 years. Intermittent weight gain.  Cannot walk distance used to.  Sleeps with one pillow.  Able to lay flat.  Denies any lower extremity edema.  Was prescribed Albuterol and Advair inhaler for COPD.  Has not been taking regularly.  History of 30+yrs smoking, quit 5 years ago. No PFTS so COPD not confirmed.     PERTINENT  PMH / PSH:  Shortness of breath Tobacco use history GERD    OBJECTIVE:   BP (!) 143/84   Pulse 76   Ht 5\' 4"  (1.626 m)   Wt 223 lb 12.8 oz (101.5 kg)   LMP 03/02/2012   SpO2 100%   BMI 38.42 kg/m    General: Alert, no acute distress Cardio: Normal S1 and S2, RRR, no r/m/g Pulm: CTAB, normal work of breathing Abdomen: Bowel sounds normal. Abdomen soft and non-tender.  Extremities: No peripheral edema.   ASSESSMENT/PLAN:   Elevated blood pressure reading Report elevated BP in setting of vacationing and increased take out eating.  Review of chart does not indicate need for hypertensive medications at this time. -Home BP monitoring -CMet today -Advised against taking medication prescribed for someone else.  Could cause potential harm -ECG at next visit for baseline -Strict return precautions provided -Follow up in 2 weeks  Shortness of breath No PFTs to confirm diagnosis. Noted to have SOB for at least 2 years.  Significant 30+yr smoking history, now quit x 5 yrs.  Has not been taking Advair daily as previously prescribed.  Low suspicion for CHF given normal lung exam and no JVD elevation or  bilateral lower extremity edema. Also considered deconditioning, anemia as contributory factor to SOB.   -Continue Advair daily -Continue Albuterol for worsening SOB prn -CBC today -Will order PFT's for confirmatory diagnosis -Follow up as needed  Hyperlipidemia Last LDL 126  -Recheck Lipid panel today -Lifestyle modifications -Follow up with results  OBESITY, NOS Intermittent fluctuations in weight gain A1c, Lipid panel today   Carollee Leitz, MD [place]

## 2021-09-06 ENCOUNTER — Ambulatory Visit (HOSPITAL_COMMUNITY)
Admission: EM | Admit: 2021-09-06 | Discharge: 2021-09-06 | Disposition: A | Discharge status: Home / Self Care | Payer: No Typology Code available for payment source

## 2021-09-06 ENCOUNTER — Telehealth: Payer: Self-pay

## 2021-09-06 ENCOUNTER — Encounter: Payer: Self-pay | Admitting: Family Medicine

## 2021-09-06 ENCOUNTER — Encounter (HOSPITAL_COMMUNITY): Payer: Self-pay | Admitting: Emergency Medicine

## 2021-09-06 DIAGNOSIS — R030 Elevated blood-pressure reading, without diagnosis of hypertension: Secondary | ICD-10-CM | POA: Insufficient documentation

## 2021-09-06 DIAGNOSIS — E785 Hyperlipidemia, unspecified: Secondary | ICD-10-CM | POA: Insufficient documentation

## 2021-09-06 HISTORY — DX: Elevated blood-pressure reading, without diagnosis of hypertension: R03.0

## 2021-09-06 NOTE — Assessment & Plan Note (Addendum)
No PFTs to confirm diagnosis. Noted to have SOB for at least 2 years.  Significant 30+yr smoking history, now quit x 5 yrs.  Has not been taking Advair daily as previously prescribed.  Low suspicion for CHF given normal lung exam and no JVD elevation or bilateral lower extremity edema. Also considered deconditioning, anemia as contributory factor to SOB.   -Continue Advair daily -Continue Albuterol for worsening SOB prn -CBC today -Will order PFT's for confirmatory diagnosis -Follow up as needed

## 2021-09-06 NOTE — Assessment & Plan Note (Addendum)
Report elevated BP in setting of vacationing and increased take out eating.  Review of chart does not indicate need for hypertensive medications at this time. -Home BP monitoring -CMet today -Advised against taking medication prescribed for someone else.  Could cause potential harm -ECG at next visit for baseline -Strict return precautions provided -Follow up in 2 weeks

## 2021-09-06 NOTE — Assessment & Plan Note (Signed)
Last LDL 126  -Recheck Lipid panel today -Lifestyle modifications -Follow up with results

## 2021-09-06 NOTE — Telephone Encounter (Signed)
Agree with plan. Thank you

## 2021-09-06 NOTE — ED Triage Notes (Signed)
Pt is present today with HA, dizziness, fatigue, and nausea. Pt states sx started yesterday

## 2021-09-06 NOTE — Discharge Instructions (Signed)
Wait to see what the blood tests done by Dr. Volanda Napoleon show. Take your blood pressure when you are calm and using a good technique every day at a time when you are calm. You might consider talking with Dr. Volanda Napoleon about menopause and if it could be causing your symptoms.

## 2021-09-06 NOTE — Assessment & Plan Note (Signed)
Intermittent fluctuations in weight gain A1c, Lipid panel today

## 2021-09-06 NOTE — Telephone Encounter (Signed)
Patient calls nurse line reporting current BP of 162/99. Patient reports nausea, dizziness, and headaches. Patient denies any SOB, chest pain or vision changes at this time.   Patient reports she was seen by PCP yesterday and was told to log home BP readings and to FU in a few weeks. Patient was not prescribed medication at this time.   Patient reports she would like to be evaluated today, "I just don't feel well at all." Given patients current symptoms and worry agreed patient should go to UC. We have no apts today or tomorrow in clinic.

## 2021-09-06 NOTE — ED Provider Notes (Signed)
[place]    CSN: [id number] Arrival date & time: 09/06/21  0923      History   Chief Complaint Chief Complaint  Patient presents with   Nausea   Dizziness   Headache    HPI Deanna Scott is a 50 y.o. female. She checked her BP at home this morning and it was high at 162/99.  She has been checking her blood pressure for the last several days and finding it elevated on several occasions.  She saw her PCP for this problem yesterday.  At that time, her blood pressure was 143/84.  Her PCP declined to put her on antihypertensives without first trying home BP monitoring.  Multiple labs were also drawn yesterday but have not resulted yet.  Patient reports several weeks of feeling lightheaded, fatigued, nauseated.  She does not have the symptoms every day, they are intermittent.  Symptoms are unchanged from usual. Of note, patient had a vaginal hysterectomy in 2013 but still has her ovaries.   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   Qualifier: Diagnosis of  By: Dianah Field MD, Marcello Moores      Patient Active Problem List   Diagnosis Date Noted   Elevated blood pressure reading 09/06/2021   Hyperlipidemia 09/06/2021   Headache 02/01/2021   BV (bacterial vaginosis) 11/11/2020   Snoring 06/08/2020   Pinched nerve 06/07/2020   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   Extremity numbness 10/24/2019   Shortness of breath 10/23/2019   Lateral epicondylitis 10/08/2019   Fatigue 10/08/2019   Screening breast examination 10/07/2019   Triceps tendonitis 07/21/2019   Low back pain 06/18/2019   Chronic sinusitis 10/18/2018    Benign paroxysmal positional vertigo 10/18/2018   Vaginal wall prolapse 07/29/2017   Migraines 07/27/2017   GERD (gastroesophageal reflux disease) 07/29/2014   OBESITY, NOS 01/03/2007    Past Surgical History:  Procedure Laterality Date   ABDOMINAL HYSTERECTOMY     BLADDER SUSPENSION  2009   DIAGNOSTIC LAPAROSCOPY     ectopic pregnancy   DILATION AND CURETTAGE OF UTERUS     hx mab   endoscopy nasal sinus surgery  01/13/2020   [facility] - CFS Leak Repair   SACROSPINOUS LIGAMENT FIXATION     posterior repair wtih cysto   SVD     x 2   TUBAL LIGATION  1999   VAGINAL HYSTERECTOMY  03/13/2012   Procedure: HYSTERECTOMY VAGINAL;  Surgeon: Mora Bellman, MD;  Location: [facility];  Service: Gynecology;  Laterality: N/A;    OB History     Gravida  4   Para  2   Term  2   Preterm      AB  2   Living  2      SAB  1   IAB      Ectopic  1   Multiple      Live Births               Home Medications    Prior to Admission medications   Medication Sig Start  Date End Date Taking? Authorizing Provider  acetaminophen (TYLENOL 8 HOUR) 650 MG CR tablet Take 2 tablets (1,300 mg total) by mouth every 8 (eight) hours as needed for pain. 06/16/19   Nuala Alpha, MD  albuterol (VENTOLIN HFA) 108 (90 Base) MCG/ACT inhaler Inhale 1 puff into the lungs every 4 (four) hours as needed for wheezing or shortness of breath. 09/05/21   Carollee Leitz, MD  aspirin-acetaminophen-caffeine (EXCEDRIN MIGRAINE) [id number] MG per tablet Take 1 tablet by mouth every 6 (six) hours as needed for pain.    [provider]  BLACK CURRANT SEED OIL PO Take by mouth daily.    [provider]  Blood Pressure KIT Check blood pressure 2-3 times week 09/05/21   Carollee Leitz, MD  ELDERBERRY PO Take by mouth daily.    [provider]  fluticasone (FLONASE) 50 MCG/ACT nasal spray Place 1 spray into both nostrils daily. 09/22/20   [provider]  fluticasone-salmeterol (ADVAIR  HFA) 115-21 MCG/ACT inhaler Inhale 2 puffs into the lungs 2 (two) times daily. 09/05/21   Carollee Leitz, MD  Multiple Vitamin (MULTIVITAMIN ADULT PO) Take 1 tablet by mouth daily. Patient not taking: Reported on 04/19/2021    [provider]  omeprazole (PRILOSEC OTC) 20 MG tablet Take 1 tablet (20 mg total) by mouth daily. 07/09/21 08/08/21  Lynden Oxford Scales, PA-C  polyethylene glycol powder (GLYCOLAX/MIRALAX) 17 GM/SCOOP powder Take by mouth as needed. 01/15/20   [provider]  vitamin B-12 (CYANOCOBALAMIN) 500 MCG tablet Take by mouth.    [provider]  sodium chloride (OCEAN) 0.65 % SOLN nasal spray Place 2 sprays into both nostrils as needed for congestion.  06/11/20  [provider]    Family History Family History  Problem Relation Age of Onset   Cancer Father    Diabetes Maternal Grandmother    Other Mother        balance issues from a "thing in her brain" requires walker   Colon cancer Mother 40       dx 2019   Rectal cancer Neg Hx    Stomach cancer Neg Hx     Social History Social History   Tobacco Use   Smoking status: Former    Packs/day: 1.00    Years: 34.00    Pack years: 34.00    Types: Cigarettes    Quit date: 09/04/2015    Years since quitting: 6.0   Smokeless tobacco: Never  Vaping Use   Vaping Use: Former  Substance Use Topics   Alcohol use: Yes    Alcohol/week: 2.0 standard drinks    Types: 2 Standard drinks or equivalent per week    Comment: socially   Drug use: No     Allergies   Latex, Hydrocodone-acetaminophen, and Oxycodone-acetaminophen   Review of Systems Review of Systems  Neurological:  Positive for dizziness and headaches.    Physical Exam Triage Vital Signs ED Triage Vitals  Enc Vitals Group     BP 09/06/21 1107 (!) 153/78     Pulse Rate 09/06/21 1107 66     Resp 09/06/21 1107 18     Temp 09/06/21 1107 98 F (36.7 C)     Temp src --      SpO2 09/06/21 1107 96 %     Weight --       Height --      Head Circumference --      Peak Flow --      Pain Score 09/06/21  1106 2     Pain Loc --      Pain Edu? --      Excl. in [place]? --    No data found.  Updated Vital Signs BP (!) 153/78   Pulse 66   Temp 98 F (36.7 C)   Resp 18   LMP 03/02/2012   SpO2 96%   Visual Acuity Right Eye Distance:   Left Eye Distance:   Bilateral Distance:    Right Eye Near:   Left Eye Near:    Bilateral Near:     Physical Exam Constitutional:      Appearance: She is well-developed. She is obese.     Comments: Pt is tearful, expresses she is worried about having a stroke  Cardiovascular:     Rate and Rhythm: Normal rate and regular rhythm.     Comments: Normal DP pulses. No carotid bruit. Mild peripheral edema B.  Pulmonary:     Effort: Pulmonary effort is normal.     Breath sounds: Normal breath sounds.  Neurological:     Mental Status: She is alert.     UC Treatments / Results  Labs (all labs ordered are listed, but only abnormal results are displayed) Labs Reviewed - No data to display  EKG   Radiology No results found.  Procedures Procedures (including critical care time)  Medications Ordered in UC Medications - No data to display  Initial Impression / Assessment and Plan / UC Course  I have reviewed the triage vital signs and the nursing notes.  Pertinent labs & imaging results that were available during my care of the patient were reviewed by me and considered in my medical decision making (see chart for details).   Discussed pathway to diagnose HTN with pt. Reassured her Dr. Loistine Chance approach to identifying the problem is a good one. Discussed proper way to check BP at home.   Final Clinical Impressions(s) / UC Diagnoses   Final diagnoses:  Elevated blood pressure reading without diagnosis of hypertension     Discharge Instructions      Wait to see what the blood tests done by Dr. Volanda Napoleon show. Take your blood pressure when you are calm and using a  good technique every day at a time when you are calm. You might consider talking with Dr. Volanda Napoleon about menopause and if it could be causing your symptoms.    ED Prescriptions   None    PDMP not reviewed this encounter.   Carvel Getting, NP 09/06/21 1148

## 2021-09-07 LAB — LIPID PANEL
Chol/HDL Ratio: 4.7 ratio — ABNORMAL HIGH (ref 0.0–4.4)
Cholesterol, Total: 198 mg/dL (ref 100–199)
HDL: 42 mg/dL (ref >39)
LDL Chol Calc (NIH): 112 mg/dL — ABNORMAL HIGH (ref 0–99)
Triglycerides: 252 mg/dL — ABNORMAL HIGH (ref 0–149)
VLDL Cholesterol Cal: 44 mg/dL — ABNORMAL HIGH (ref 5–40)

## 2021-09-07 LAB — COMPREHENSIVE METABOLIC PANEL
ALT: 7 IU/L (ref 0–32)
AST: 15 IU/L (ref 0–40)
Albumin/Globulin Ratio: 1.6 (ref 1.2–2.2)
Albumin: 4.1 g/dL (ref 3.8–4.9)
Alkaline Phosphatase: 117 IU/L (ref 44–121)
BUN/Creatinine Ratio: 24 — ABNORMAL HIGH (ref 9–23)
BUN: 16 mg/dL (ref 6–24)
Bilirubin Total: 0.2 mg/dL (ref 0.0–1.2)
CO2: 22 mmol/L (ref 20–29)
Calcium: 9 mg/dL (ref 8.7–10.2)
Chloride: 102 mmol/L (ref 96–106)
Creatinine, Ser: 0.68 mg/dL (ref 0.57–1.00)
Globulin, Total: 2.5 g/dL (ref 1.5–4.5)
Glucose: 111 mg/dL — ABNORMAL HIGH (ref 70–99)
Potassium: 3.9 mmol/L (ref 3.5–5.2)
Sodium: 139 mmol/L (ref 134–144)
Total Protein: 6.6 g/dL (ref 6.0–8.5)
eGFR: 105 mL/min/{1.73_m2} (ref >59)

## 2021-09-07 LAB — T PALLIDUM ANTIBODY, EIA: T pallidum Antibody, EIA: NEGATIVE

## 2021-09-07 LAB — HCV INTERPRETATION

## 2021-09-07 LAB — CBC
Hematocrit: 38.9 % (ref 34.0–46.6)
Hemoglobin: 12.5 g/dL (ref 11.1–15.9)
MCH: 29.3 pg (ref 26.6–33.0)
MCHC: 32.1 g/dL (ref 31.5–35.7)
MCV: 91 fL (ref 79–97)
Platelets: 256 10*3/uL (ref 150–450)
RBC: 4.26 x10E6/uL (ref 3.77–5.28)
RDW: 13.3 % (ref 11.7–15.4)
WBC: 9.1 10*3/uL (ref 3.4–10.8)

## 2021-09-07 LAB — HCV AB W REFLEX TO QUANT PCR: HCV Ab: <0.1 s/co ratio (ref 0.0–0.9)

## 2021-09-07 LAB — HEMOGLOBIN A1C
Est. average glucose Bld gHb Est-mCnc: 131 mg/dL
Hgb A1c MFr Bld: 6.2 % — ABNORMAL HIGH (ref 4.8–5.6)

## 2021-09-07 LAB — VITAMIN B12: Vitamin B-12: 1646 pg/mL — ABNORMAL HIGH (ref 232–1245)

## 2021-09-07 LAB — RPR W/REFLEX TO TREPSURE: RPR: NONREACTIVE

## 2021-09-09 ENCOUNTER — Other Ambulatory Visit: Payer: Self-pay | Admitting: Family Medicine

## 2021-09-09 ENCOUNTER — Telehealth: Payer: Self-pay

## 2021-09-09 NOTE — Telephone Encounter (Signed)
Recommend that patient measure blood pressure once a week and book appointment to be seen in clinic.  Also recommend that she avoid Excedrin or any NSAID containing products.  If symptomatic  can  go to ED.    Carollee Leitz, MD [facility]

## 2021-09-09 NOTE — Telephone Encounter (Signed)
Patient LVM on nurse line regarding elevated BP. Reports that BP is 163/96. Seen in UC on 11/1 for elevated BP and was told to wait for lab results prior to BP medication being prescribed. Patient is currently asymptomatic.   Advised of ED precautions.   Please advise how patient is to proceed with BP management.   Talbot Grumbling, RN

## 2021-09-14 NOTE — Telephone Encounter (Signed)
Attempted to contact patient to inform of provider recommendations. Patient did not answer, mailbox full.   Talbot Grumbling, RN

## 2021-09-15 NOTE — Telephone Encounter (Signed)
Patient calls nurse line requesting lab results and to see if BP meds would be started.   Patient advised she needs to make an apt to discuss per PCP.   Patient reports her BPs at home have been ~144/85, 155/95, 137/84.  Patient denies any current sxs. ED precautions given and apt scheduled for Monday.

## 2021-09-18 NOTE — Progress Notes (Signed)
    SUBJECTIVE:   CHIEF COMPLAINT / HPI: BP concern   Patient was evaluated in UC and found to hae persistently elevated BP.  Patient states that her daughter also has problems with high blood pressure has been on medication for a while.  Patient states that she sometimes has temporal headaches that feel different than her migraines.  She reports that she sometimes feels a tightness in her chest as well as difficulty breathing.  She states that she has been told she may have COPD but has not been formally diagnosed.  Patient denies any blurry vision or neurological symptoms.  Fatigue Patient states that she often feels extremely fatigued even after sleeping 8 to 9 hours per night.  She asked if potentially experiencing menopause could be causing her fatigue.  Patient had recent blood work that did not show any signs of anemia.  Was checked in June 2022 and did not show any abnormalities at that time.  PERTINENT  PMH / PSH:  Migraine  Obesity  GERD  HLD  OBJECTIVE:   BP 140/90   Pulse 84   Ht 5\' 4"  (1.626 m)   Wt 222 lb (100.7 kg)   LMP 03/02/2012   SpO2 98%   BMI 38.11 kg/m   Physical Exam Constitutional:      General: She is not in acute distress.    Appearance: Normal appearance. She is obese. She is not ill-appearing or diaphoretic.  Eyes:     General: No scleral icterus. Neck:     Comments: No thyroid nodules, no signs of enlarged thyroid Cardiovascular:     Rate and Rhythm: Normal rate and regular rhythm.     Pulses: Normal pulses.     Heart sounds: Normal heart sounds. No murmur heard.   No friction rub. No gallop.  Pulmonary:     Effort: Pulmonary effort is normal. No respiratory distress.     Breath sounds: Normal breath sounds. No wheezing or rales.  Musculoskeletal:        General: No swelling.     Cervical back: No tenderness.     Right lower leg: No edema.     Left lower leg: No edema.  Lymphadenopathy:     Cervical: No cervical adenopathy.  Neurological:      Mental Status: She is alert.     ASSESSMENT/PLAN:   HTN (hypertension) Patient is here multiple recent visits with blood pressures elevated.  140/90 or greater.  Will began amlodipine 5 mg today and have patient follow-up in 1 to 2 weeks.  Recent blood work on 10/31 showed normal renal function with creatinine less than 1 as well as potassium of 3.9. Counseled patient on goal blood pressure less than 140/90 and ED precautions including neurological deficits, blurry vision, chest pain or shortness of breath.  Fatigue Patient reports that she continues to experience significant fatigue despite sleeping 8/9 hours per night.  Will refer patient for pulmonology evaluation for OSA given increased weight and reports of shortness of breath.  Patient is not showing signs of volume overload to increase suspicion for cardiac cause for fatigue at this time.  Reports questionable COPD without formal diagnosis.  Patient will benefit from PFTs as well as sleep study.  We will also check TSH today Discussed referral with patient, she states she recently had sleep study so we will cancel this referral      Eulis Foster, MD [place]

## 2021-09-19 ENCOUNTER — Other Ambulatory Visit: Payer: Self-pay

## 2021-09-19 ENCOUNTER — Ambulatory Visit (HOSPITAL_COMMUNITY)
Admission: RE | Admit: 2021-09-19 | Discharge: 2021-09-19 | Disposition: A | Discharge status: Home / Self Care | Payer: No Typology Code available for payment source | Source: Ambulatory Visit | Attending: Family Medicine | Admitting: Family Medicine

## 2021-09-19 ENCOUNTER — Ambulatory Visit (INDEPENDENT_AMBULATORY_CARE_PROVIDER_SITE_OTHER): Payer: Self-pay | Admitting: Family Medicine

## 2021-09-19 VITALS — BP 140/90 | HR 84 | Ht 64.0 in | Wt 222.0 lb

## 2021-09-19 DIAGNOSIS — R0602 Shortness of breath: Secondary | ICD-10-CM

## 2021-09-19 DIAGNOSIS — I10 Essential (primary) hypertension: Secondary | ICD-10-CM

## 2021-09-19 DIAGNOSIS — Z6838 Body mass index (BMI) 38.0-38.9, adult: Secondary | ICD-10-CM

## 2021-09-19 DIAGNOSIS — R5383 Other fatigue: Secondary | ICD-10-CM

## 2021-09-19 DIAGNOSIS — R002 Palpitations: Secondary | ICD-10-CM

## 2021-09-19 DIAGNOSIS — J45909 Unspecified asthma, uncomplicated: Secondary | ICD-10-CM

## 2021-09-19 MED ORDER — ALBUTEROL SULFATE HFA 108 (90 BASE) MCG/ACT IN AERS
1.0000 | INHALATION_SPRAY | RESPIRATORY_TRACT | 0 refills | Status: DC | PRN
  Filled 2021-09-19: qty 18, 16d supply, fill #0

## 2021-09-19 MED ORDER — ADVAIR HFA 115-21 MCG/ACT IN AERO
2.0000 | INHALATION_SPRAY | Freq: Two times a day (BID) | RESPIRATORY_TRACT | 0 refills | Status: DC
  Filled 2021-09-19: qty 12, 30d supply, fill #0

## 2021-09-19 MED ORDER — AMLODIPINE BESYLATE 5 MG PO TABS
5.0000 mg | ORAL_TABLET | Freq: Every day | ORAL | 3 refills | Status: DC
  Filled 2021-09-19: qty 30, 30d supply, fill #0

## 2021-09-19 NOTE — Assessment & Plan Note (Addendum)
Patient reports that she continues to experience significant fatigue despite sleeping 8/9 hours per night.  Will refer patient for pulmonology evaluation for OSA given increased weight and reports of shortness of breath.  Patient is not showing signs of volume overload to increase suspicion for cardiac cause for fatigue at this time.  Reports questionable COPD without formal diagnosis.  Patient will benefit from PFTs as well as sleep study.  We will also check TSH today Discussed referral with patient, she states she recently had sleep study so we will cancel this referral

## 2021-09-19 NOTE — Patient Instructions (Signed)
I will check your thyroid levels today to see if this could be contributing to your fatigue, feelings of heart rate increase as well as changes in your blood pressure.  We will start amlodipine, 5 mg today for your blood pressure.  Please take this daily.  Please follow-up with our office in 1-2 weeks.  Goal was to have your blood pressure less than 140/90.  Please seek care immediately in the emergency department if you begin to have persistent chest pain, difficulty breathing, weakness on one side of your body or develop severe headache.  I have also submitted a referral to pulmonology for evaluation for a sleep study as this could also be contributing to your increased fatigue in addition to feeling not well rested after sleeping several hours.

## 2021-09-19 NOTE — Assessment & Plan Note (Signed)
Patient is here multiple recent visits with blood pressures elevated.  140/90 or greater.  Will began amlodipine 5 mg today and have patient follow-up in 1 to 2 weeks.  Recent blood work on 10/31 showed normal renal function with creatinine less than 1 as well as potassium of 3.9. Counseled patient on goal blood pressure less than 140/90 and ED precautions including neurological deficits, blurry vision, chest pain or shortness of breath.

## 2021-09-20 ENCOUNTER — Encounter: Payer: Self-pay | Admitting: Family Medicine

## 2021-09-20 LAB — TSH: TSH: 1.62 u[IU]/mL (ref 0.450–4.500)

## 2021-09-22 ENCOUNTER — Ambulatory Visit: Payer: No Typology Code available for payment source | Admitting: Family Medicine

## 2021-10-04 NOTE — Patient Instructions (Signed)
Thank you for coming to see me today. It was a pleasure.   Your A1c at last visit was 6.2.  This means you are prediabetic.  Can decrease this with diet and exercise and recheck in 6 months.  If continues to stay high we can discuss medications to help lower blood sugar levels.  Your cholesterol is high meaning that your 10 year risk of having a heart attack or stroke is 6.1%. This can also be decreased with diet and exercise.  We can recheck in 6 months after lifestyle modifications and if continues to be elevated we can discuss medications that can help lower cholesterol.  Please follow-up with PCP as needed  If you have any questions or concerns, please do not hesitate to call the office at [phone].  Best,   Carollee Leitz, MD    Hypertension, Adult Hypertension is another name for high blood pressure. High blood pressure forces your heart to work harder to pump blood. This can cause problems over time. There are two numbers in a blood pressure reading. There is a top number (systolic) over a bottom number (diastolic). It is best to have a blood pressure that is below 120/80. Healthy choices can help lower your blood pressure, or you may need medicine to help lower it. What are the causes? The cause of this condition is not known. Some conditions may be related to high blood pressure. What increases the risk? Smoking. Having type 2 diabetes mellitus, high cholesterol, or both. Not getting enough exercise or physical activity. Being overweight. Having too much fat, sugar, calories, or salt (sodium) in your diet. Drinking too much alcohol. Having long-term (chronic) kidney disease. Having a family history of high blood pressure. Age. Risk increases with age. Race. You may be at higher risk if you are African American. Gender. Men are at higher risk than women before age 14. After age 26, women are at higher risk than men. Having obstructive sleep apnea. Stress. What are the  signs or symptoms? High blood pressure may not cause symptoms. Very high blood pressure (hypertensive crisis) may cause: Headache. Feelings of worry or nervousness (anxiety). Shortness of breath. Nosebleed. A feeling of being sick to your stomach (nausea). Throwing up (vomiting). Changes in how you see. Very bad chest pain. Seizures. How is this treated? This condition is treated by making healthy lifestyle changes, such as: Eating healthy foods. Exercising more. Drinking less alcohol. Your health care provider may prescribe medicine if lifestyle changes are not enough to get your blood pressure under control, and if: Your top number is above 130. Your bottom number is above 80. Your personal target blood pressure may vary. Follow these instructions at home: Eating and drinking  If told, follow the DASH eating plan. To follow this plan: Fill one half of your plate at each meal with fruits and vegetables. Fill one fourth of your plate at each meal with whole grains. Whole grains include whole-wheat pasta, brown rice, and whole-grain bread. Eat or drink low-fat dairy products, such as skim milk or low-fat yogurt. Fill one fourth of your plate at each meal with low-fat (lean) proteins. Low-fat proteins include fish, chicken without skin, eggs, beans, and tofu. Avoid fatty meat, cured and processed meat, or chicken with skin. Avoid pre-made or processed food. Eat less than 1,500 mg of salt each day. Do not drink alcohol if: Your doctor tells you not to drink. You are pregnant, may be pregnant, or are planning to become pregnant. If  you drink alcohol: Limit how much you use to: 0-1 drink a day for women. 0-2 drinks a day for men. Be aware of how much alcohol is in your drink. In the [place] one drink equals one 12 oz bottle of beer (355 mL), one 5 oz glass of wine (148 mL), or one 1 oz glass of hard liquor (44 mL). Lifestyle  Work with your doctor to stay at a healthy weight or to  lose weight. Ask your doctor what the best weight is for you. Get at least 30 minutes of exercise most days of the week. This may include walking, swimming, or biking. Get at least 30 minutes of exercise that strengthens your muscles (resistance exercise) at least 3 days a week. This may include lifting weights or doing Pilates. Do not use any products that contain nicotine or tobacco, such as cigarettes, e-cigarettes, and chewing tobacco. If you need help quitting, ask your doctor. Check your blood pressure at home as told by your doctor. Keep all follow-up visits as told by your doctor. This is important. Medicines Take over-the-counter and prescription medicines only as told by your doctor. Follow directions carefully. Do not skip doses of blood pressure medicine. The medicine does not work as well if you skip doses. Skipping doses also puts you at risk for problems. Ask your doctor about side effects or reactions to medicines that you should watch for. Contact a doctor if you: Think you are having a reaction to the medicine you are taking. Have headaches that keep coming back (recurring). Feel dizzy. Have swelling in your ankles. Have trouble with your vision. Get help right away if you: Get a very bad headache. Start to feel mixed up (confused). Feel weak or numb. Feel faint. Have very bad pain in your: Chest. Belly (abdomen). Throw up more than once. Have trouble breathing. Summary Hypertension is another name for high blood pressure. High blood pressure forces your heart to work harder to pump blood. For most people, a normal blood pressure is less than 120/80. Making healthy choices can help lower blood pressure. If your blood pressure does not get lower with healthy choices, you may need to take medicine. This information is not intended to replace advice given to you by your health care provider. Make sure you discuss any questions you have with your health care  provider. Document Revised: 07/03/2018 Document Reviewed: 07/03/2018 Elsevier Patient Education  2022 [employer].  Prediabetes Prediabetes is when your blood sugar (blood glucose) level is higher than normal but not high enough for you to be diagnosed with type 2 diabetes. Having prediabetes puts you at risk for developing type 2 diabetes (type 2 diabetes mellitus). With certain lifestyle changes, you may be able to prevent or delay the onset of type 2 diabetes. This is important because type 2 diabetes can lead to serious complications, such as: Heart disease. Stroke. Blindness. Kidney disease. Depression. Poor circulation in the feet and legs. In severe cases, this could lead to surgical removal of a leg (amputation). What are the causes? The exact cause of prediabetes is not known. It may result from insulin resistance. Insulin resistance develops when cells in the body do not respond properly to insulin that the body makes. This can cause excess glucose to build up in the blood. High blood glucose (hyperglycemia) can develop. What increases the risk? The following factors may make you more likely to develop this condition: You have a family member with type 2 diabetes. You are  older than 45 years. You had a temporary form of diabetes during a pregnancy (gestational diabetes). You had polycystic ovary syndrome (PCOS). You are overweight or obese. You are inactive (sedentary). You have a history of heart disease, including problems with cholesterol levels, high levels of blood fats, or high blood pressure. What are the signs or symptoms? You may have no symptoms. If you do have symptoms, they may include: Increased hunger. Increased thirst. Increased urination. Vision changes, such as blurry vision. Tiredness (fatigue). How is this diagnosed? This condition can be diagnosed with blood tests. Your blood glucose may be checked with one or more of the following tests: A fasting  blood glucose (FBG) test. You will not be allowed to eat (you will fast) for at least 8 hours before a blood sample is taken. An A1C blood test (hemoglobin A1C). This test provides information about blood glucose levels over the previous 2?3 months. An oral glucose tolerance test (OGTT). This test measures your blood glucose at two points in time: After fasting. This is your baseline level. Two hours after you drink a beverage that contains glucose. You may be diagnosed with prediabetes if: Your FBG is 100?125 mg/dL (5.6-6.9 mmol/L). Your A1C level is 5.7?6.4% (39-46 mmol/mol). Your OGTT result is 140?199 mg/dL (7.8-11 mmol/L). These blood tests may be repeated to confirm your diagnosis. How is this treated? Treatment may include dietary and lifestyle changes to help lower your blood glucose and prevent type 2 diabetes from developing. In some cases, medicine may be prescribed to help lower the risk of type 2 diabetes. Follow these instructions at home: Nutrition  Follow a healthy meal plan. This includes eating lean proteins, whole grains, legumes, fresh fruits and vegetables, low-fat dairy products, and healthy fats. Follow instructions from your health care provider about eating or drinking restrictions. Meet with a dietitian to create a healthy eating plan that is right for you. Lifestyle Do moderate-intensity exercise for at least 30 minutes a day on 5 or more days each week, or as told by your health care provider. A mix of activities may be best, such as: Brisk walking, swimming, biking, and weight lifting. Lose weight as told by your health care provider. Losing 5-7% of your body weight can reverse insulin resistance. Do not drink alcohol if: Your health care provider tells you not to drink. You are pregnant, may be pregnant, or are planning to become pregnant. If you drink alcohol: Limit how much you use to: 0-1 drink a day for women. 0-2 drinks a day for men. Be aware of how  much alcohol is in your drink. In the [place] one drink equals one 12 oz bottle of beer (355 mL), one 5 oz glass of wine (148 mL), or one 1 oz glass of hard liquor (44 mL). General instructions Take over-the-counter and prescription medicines only as told by your health care provider. You may be prescribed medicines that help lower the risk of type 2 diabetes. Do not use any products that contain nicotine or tobacco, such as cigarettes, e-cigarettes, and chewing tobacco. If you need help quitting, ask your health care provider. Keep all follow-up visits. This is important. Where to find more information American Diabetes Association: www.diabetes.org Academy of Nutrition and Dietetics: www.eatright.org American Heart Association: www.heart.org Contact a health care provider if: You have any of these symptoms: Increased hunger. Increased urination. Increased thirst. Fatigue. Vision changes, such as blurry vision. Get help right away if you: Have shortness of breath. Feel confused. Vomit  or feel like you may vomit. Summary Prediabetes is when your blood sugar (blood glucose)level is higher than normal but not high enough for you to be diagnosed with type 2 diabetes. Having prediabetes puts you at risk for developing type 2 diabetes (type 2 diabetes mellitus). Make lifestyle changes such as eating a healthy diet and exercising regularly to help prevent diabetes. Lose weight as told by your health care provider. This information is not intended to replace advice given to you by your health care provider. Make sure you discuss any questions you have with your health care provider. Document Revised: 01/22/2020 Document Reviewed: 01/22/2020 Elsevier Patient Education  [place].   High Cholesterol High cholesterol is a condition in which the blood has high levels of a white, waxy substance similar to fat (cholesterol). The liver makes all the cholesterol that the body needs. The human  body needs small amounts of cholesterol to help build cells. A person gets extra or excess cholesterol from the food that he or she eats. The blood carries cholesterol from the liver to the rest of the body. If you have high cholesterol, deposits (plaques) may build up on the walls of your arteries. Arteries are the blood vessels that carry blood away from your heart. These plaques make the arteries narrow and stiff. Cholesterol plaques increase your risk for heart attack and stroke. Work with your health care provider to keep your cholesterol levels in a healthy range. What increases the risk? The following factors may make you more likely to develop this condition: Eating foods that are high in animal fat (saturated fat) or cholesterol. Being overweight. Not getting enough exercise. A family history of high cholesterol (familial hypercholesterolemia). Use of tobacco products. Having diabetes. What are the signs or symptoms? In most cases, high cholesterol does not usually cause any symptoms. In severe cases, very high cholesterol levels can cause: Fatty bumps under the skin (xanthomas). A white or gray ring around the black center (pupil) of the eye. How is this diagnosed? This condition may be diagnosed based on the results of a blood test. If you are older than 52 years of age, your health care provider may check your cholesterol levels every 4-6 years. You may be checked more often if you have high cholesterol or other risk factors for heart disease. The blood test for cholesterol measures: "Bad" cholesterol, or LDL cholesterol. This is the main type of cholesterol that causes heart disease. The desired level is less than 100 mg/dL (2.59 mmol/L). "Good" cholesterol, or HDL cholesterol. HDL helps protect against heart disease by cleaning the arteries and carrying the LDL to the liver for processing. The desired level for HDL is 60 mg/dL (1.55 mmol/L) or higher. Triglycerides. These are  fats that your body can store or burn for energy. The desired level is less than 150 mg/dL (1.69 mmol/L). Total cholesterol. This measures the total amount of cholesterol in your blood and includes LDL, HDL, and triglycerides. The desired level is less than 200 mg/dL (5.17 mmol/L). How is this treated? Treatment for high cholesterol starts with lifestyle changes, such as diet and exercise. Diet changes. You may be asked to eat foods that have more fiber and less saturated fats or added sugar. Lifestyle changes. These may include regular exercise, maintaining a healthy weight, and quitting use of tobacco products. Medicines. These are given when diet and lifestyle changes have not worked. You may be prescribed a statin medicine to help lower your cholesterol levels. Follow  these instructions at home: Eating and drinking  Eat a healthy, balanced diet. This diet includes: Daily servings of a variety of fresh, frozen, or canned fruits and vegetables. Daily servings of whole grain foods that are rich in fiber. Foods that are low in saturated fats and trans fats. These include poultry and fish without skin, lean cuts of meat, and low-fat dairy products. A variety of fish, especially oily fish that contain omega-3 fatty acids. Aim to eat fish at least 2 times a week. Avoid foods and drinks that have added sugar. Use healthy cooking methods, such as roasting, grilling, broiling, baking, poaching, steaming, and stir-frying. Do not fry your food except for stir-frying. If you drink alcohol: Limit how much you have to: 0-1 drink a day for women who are not pregnant. 0-2 drinks a day for men. Know how much alcohol is in a drink. In the [place] one drink equals one 12 oz bottle of beer (355 mL), one 5 oz glass of wine (148 mL), or one 1 oz glass of hard liquor (44 mL). Lifestyle  Get regular exercise. Aim to exercise for a total of 150 minutes a week. Increase your activity level by doing activities such  as gardening, walking, and taking the stairs. Do not use any products that contain nicotine or tobacco. These products include cigarettes, chewing tobacco, and vaping devices, such as e-cigarettes. If you need help quitting, ask your health care provider. General instructions Take over-the-counter and prescription medicines only as told by your health care provider. Keep all follow-up visits. This is important. Where to find more information American Heart Association: www.heart.org National Heart, Lung, and Blood Institute: https://wilson-eaton.com/ Contact a health care provider if: You have trouble achieving or maintaining a healthy diet or weight. You are starting an exercise program. You are unable to stop smoking. Get help right away if: You have chest pain. You have trouble breathing. You have discomfort or pain in your jaw, neck, back, shoulder, or arm. You have any symptoms of a stroke. "BE FAST" is an easy way to remember the main warning signs of a stroke: B - Balance. Signs are dizziness, sudden trouble walking, or loss of balance. E - Eyes. Signs are trouble seeing or a sudden change in vision. F - Face. Signs are sudden weakness or numbness of the face, or the face or eyelid drooping on one side. A - Arms. Signs are weakness or numbness in an arm. This happens suddenly and usually on one side of the body. S - Speech. Signs are sudden trouble speaking, slurred speech, or trouble understanding what people say. T - Time. Time to call emergency services. Write down what time symptoms started. You have other signs of a stroke, such as: A sudden, severe headache with no known cause. Nausea or vomiting. Seizure. These symptoms may represent a serious problem that is an emergency. Do not wait to see if the symptoms will go away. Get medical help right away. Call your local emergency services (911 in the [place] Do not drive yourself to the hospital. Summary Cholesterol plaques increase your  risk for heart attack and stroke. Work with your health care provider to keep your cholesterol levels in a healthy range. Eat a healthy, balanced diet, get regular exercise, and maintain a healthy weight. Do not use any products that contain nicotine or tobacco. These products include cigarettes, chewing tobacco, and vaping devices, such as e-cigarettes. Get help right away if you have any symptoms of a stroke. This  information is not intended to replace advice given to you by your health care provider. Make sure you discuss any questions you have with your health care provider. Document Revised: 01/06/2021 Document Reviewed: 12/27/2020 Elsevier Patient Education  2022 [employer].

## 2021-10-04 NOTE — Progress Notes (Signed)
    SUBJECTIVE:   CHIEF COMPLAINT / HPI:   Presents for follow up for HTN. Seen in clinic on 11/14 and treated with Amlodipine 5 mg daily.  Since then patient reports improvement in symptoms.  BP at home 120's-130's/70's-80's. Denies any chest pain, worsening shortness of breath, headaches, visual changes, abdominal pain, nausea or vomiting.  Associated symptoms include trace of lower extremity swelling.    PERTINENT  PMH / PSH:  HTN Obesity class 1  OBJECTIVE:   BP 126/75   Pulse 75   Wt 225 lb 12.8 oz (102.4 kg)   LMP 03/02/2012   SpO2 97%   BMI 38.76 kg/m    General: Alert, no acute distress Cardio: Normal S1 and S2, RRR, no r/m/g Pulm: CTAB, normal work of breathing Abdomen: Bowel sounds normal. Abdomen soft and non-tender.  Extremities: Trace peripheral edema.   ASSESSMENT/PLAN:   HTN (hypertension) Well controlled after initiation of antihypertensive medication.  Asymptomatic -Continue Amlodipine 5 mg daily -Lifestyle modifications -Strict return precautions provided -Follow up with PCP in 2 weeks  Prediabetes Last A1c 6.2.  Discussed with patient options.  Patient opted diet and exercise Will recheck in 6 months and if continued to be elevated can discuss initiation of Metformin. Follow up in 6 months  Dyslipidemia Elevated Trigs 252  LDL 112 goal <100 Patient taking Omega tablets daily ASCVD 10y risk 6.1%  Discussed with patient and opted for lifestyle changes Repeat in 6 months   Shortness of breath Has not had PFT's.  Reports dx with COPD and given medications -Patient to schedule appointment with Dr Valentina Lucks for PFT in 2 weeks -Follow up with results     Carollee Leitz, MD [place]

## 2021-10-05 ENCOUNTER — Ambulatory Visit (INDEPENDENT_AMBULATORY_CARE_PROVIDER_SITE_OTHER): Payer: Self-pay | Admitting: Family Medicine

## 2021-10-05 ENCOUNTER — Other Ambulatory Visit: Payer: Self-pay

## 2021-10-05 ENCOUNTER — Encounter: Payer: Self-pay | Admitting: Family Medicine

## 2021-10-05 VITALS — BP 126/75 | HR 75 | Wt 225.8 lb

## 2021-10-05 DIAGNOSIS — I10 Essential (primary) hypertension: Secondary | ICD-10-CM

## 2021-10-05 DIAGNOSIS — R7303 Prediabetes: Secondary | ICD-10-CM

## 2021-10-05 DIAGNOSIS — E785 Hyperlipidemia, unspecified: Secondary | ICD-10-CM

## 2021-10-05 DIAGNOSIS — R0602 Shortness of breath: Secondary | ICD-10-CM

## 2021-10-05 NOTE — Assessment & Plan Note (Signed)
Has not had PFT's.  Reports dx with COPD and given medications -Patient to schedule appointment with Dr Valentina Lucks for PFT in 2 weeks -Follow up with results

## 2021-10-05 NOTE — Assessment & Plan Note (Signed)
Elevated Trigs 252  LDL 112 goal <100 Patient taking Omega tablets daily ASCVD 10y risk 6.1%  Discussed with patient and opted for lifestyle changes Repeat in 6 months

## 2021-10-05 NOTE — Assessment & Plan Note (Signed)
Last A1c 6.2.  Discussed with patient options.  Patient opted diet and exercise Will recheck in 6 months and if continued to be elevated can discuss initiation of Metformin. Follow up in 6 months

## 2021-10-05 NOTE — Assessment & Plan Note (Signed)
Well controlled after initiation of antihypertensive medication.  Asymptomatic -Continue Amlodipine 5 mg daily -Lifestyle modifications -Strict return precautions provided -Follow up with PCP in 2 weeks

## 2021-10-10 ENCOUNTER — Telehealth: Payer: Self-pay

## 2021-10-10 NOTE — Telephone Encounter (Signed)
Patient calls nurse line due to heart palpitations. Reports that her heart has been "racing all weekend." Patient states that she did not take her amlodipine this morning and feels that she is no longer having palpitations.    Denies difficulty breathing or chest pain. Patient has concerns that amlodipine is causing these palpitations. Patient is asking if she should stop amlodipine or how she should proceed.   Patient states that she does not have insurance and would like to discuss over the phone if possible.   Advised of ED and UC precautions.   Talbot Grumbling, RN

## 2021-10-11 NOTE — Telephone Encounter (Signed)
She should be evaluated by provider, stop medication and monitor blood pressure if  >160/100 recommend to follow up in UC or clinic   Carollee Leitz, MD [facility]

## 2021-10-11 NOTE — Telephone Encounter (Signed)
Called patient and scheduled for next available appointment on Friday morning. Discussed red flags and UC/ED precautions with patient.   Talbot Grumbling, RN

## 2021-10-12 NOTE — Telephone Encounter (Signed)
Thank you :)

## 2021-10-14 ENCOUNTER — Ambulatory Visit: Payer: No Typology Code available for payment source

## 2021-10-18 NOTE — Patient Instructions (Addendum)
Thank you for coming to see me today. It was a pleasure.   Stop Amlodipine Monitor blood pressure at home.  If greater than 160/100 call MD or go to urgent care  Recommend Shingles Vaccine Recommend COVID booster  Referral sent for mammogram.   Please follow-up with PCP in 2 weeks 12/22 at 910 am  If you have any questions or concerns, please do not hesitate to call the office at [phone].  Best,   Carollee Leitz, MD

## 2021-10-18 NOTE — Progress Notes (Signed)
° ° °  SUBJECTIVE:   CHIEF COMPLAINT / HPI: Blood pressure follow up  Presents for follow up for follow elevated blood pressure. Seen in clinic on 11/30 and reported improvement in blood pressure with Amlodipine 5 mg.  Since then patient reports having palpations that resolved when stopped medication.  Has not taken Amlodipine since 12/04. BP at home 123-127/74-85. Has been eating healthier and started exercising a little.  Lost 9lbs since last visit. Associated symptoms include none since stopping Amlodipine.     PERTINENT  PMH / PSH:  HTN Dyslipidemia Reported COPD Class 1 obesity  OBJECTIVE:   BP 118/76    Pulse 75    Ht 5\' 4"  (1.626 m)    Wt 217 lb (98.4 kg)    LMP 03/02/2012    SpO2 97%    BMI 37.25 kg/m    General: Alert, no acute distress Cardio: Normal S1 and S2, RRR, no r/m/g Pulm: CTAB, normal work of breathing Abdomen: Bowel sounds normal. Abdomen soft and non-tender.   ASSESSMENT/PLAN:   HTN (hypertension) Normotensive.  Home BP has been good.  Did not tolerate Amlodipine with increase in palpitations that has now resolved since stopping medications -Discontinue Amlodipine -Continue to monitor BP at home -Continue lifestyle changes -Strict return precautions provided -Follow up appointment scheduled 12/22  Screening breast examination Mammogram ordered today   Declined COVID Declined Shingles vaccine   Awaiting insurance approval  PHQ9 screening increased today.  No SI/HI Discussed therapy, patient declined due to no insurance Will readdress at next visit  Carollee Leitz, MD [place]

## 2021-10-19 ENCOUNTER — Encounter: Payer: Self-pay | Admitting: Family Medicine

## 2021-10-19 ENCOUNTER — Ambulatory Visit (INDEPENDENT_AMBULATORY_CARE_PROVIDER_SITE_OTHER): Payer: Self-pay | Admitting: Family Medicine

## 2021-10-19 ENCOUNTER — Other Ambulatory Visit: Payer: Self-pay

## 2021-10-19 VITALS — BP 118/76 | HR 75 | Ht 64.0 in | Wt 217.0 lb

## 2021-10-19 DIAGNOSIS — I10 Essential (primary) hypertension: Secondary | ICD-10-CM

## 2021-10-19 DIAGNOSIS — Z1239 Encounter for other screening for malignant neoplasm of breast: Secondary | ICD-10-CM

## 2021-10-19 DIAGNOSIS — Z1231 Encounter for screening mammogram for malignant neoplasm of breast: Secondary | ICD-10-CM

## 2021-10-19 NOTE — Assessment & Plan Note (Addendum)
Normotensive.  Home BP has been good.  Did not tolerate Amlodipine with increase in palpitations that has now resolved since stopping medications -Discontinue Amlodipine -Continue to monitor BP at home -Continue lifestyle changes -Strict return precautions provided -Follow up appointment scheduled 12/22

## 2021-10-19 NOTE — Assessment & Plan Note (Signed)
Mammogram ordered today 

## 2021-10-20 ENCOUNTER — Encounter: Payer: Self-pay | Admitting: Pharmacist

## 2021-10-20 ENCOUNTER — Ambulatory Visit (INDEPENDENT_AMBULATORY_CARE_PROVIDER_SITE_OTHER): Payer: Self-pay | Admitting: Pharmacist

## 2021-10-20 ENCOUNTER — Other Ambulatory Visit: Payer: Self-pay

## 2021-10-20 DIAGNOSIS — R0602 Shortness of breath: Secondary | ICD-10-CM

## 2021-10-20 NOTE — Progress Notes (Signed)
Reviewed: I agree with Dr. Koval's documentation and management. 

## 2021-10-20 NOTE — Addendum Note (Signed)
Addended by: Leavy Cella on: 10/20/2021 10:59 AM   Modules accepted: Orders

## 2021-10-20 NOTE — Assessment & Plan Note (Signed)
Patient has been experiencing SOB for about a year and taking Advair and albuterol as needed. Spirometry evaluation with pre- and post-bronchodilator reveals normal lung function. No evidence of COPD. Suspect SOB may be related to deconditioning. She can further discuss results and need for any medication changes at her visit with Dr. Volanda Napoleon next week.  -Continue current medications: Advair HFA (fluticasone-salmeterol) 115-21 mcg/act - 2 puffs BID (she is taking PRN), albuterol PRN - consider adjustment with PCP. -Reviewed results of pulmonary function tests. Pt verbalized understanding of results and education.

## 2021-10-20 NOTE — Patient Instructions (Signed)
Nice to see you today. Continue to stay quit from cigarettes.   Breathing test showed near normal spirometry readings despite you having the flu recently.  Thank you for working hard to get the best test you could today.   Next visit back with Dr. Volanda Napoleon

## 2021-10-20 NOTE — Progress Notes (Signed)
S:    Patient arrives in good spirits, ambulating independently. Presents for lung function evaluation. Patient was referred and last seen by Primary Care Provider, Dr. Volanda Napoleon, on 10/19/21.   Patient reports she has had SOB for about a year which is when Advair and albuterol were prescribed. She uses these medications as needed. She was told she may have COPD which is when her inhalers were prescribed but she has never had any formal testing or diagnosis. Endorses SOB with activity like walking up the stairs. She is able to walk up a flight of stairs but needs to rest at the top to catch her breath and let HR come down, especially when she is carrying things up the stairs. 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 She used to walk more often but has not been doing this lately. Other than activity, reports possible triggers as being exposure to these cleaning supplies. She does not wear a mask while cleaning. Reports increased inhaler use recently since being exposed to cleaning supplies more frequently.   Reports smoking 1ppd for 34 years from age 20 to 16 (46 pack-years). She quit 6 years ago.   She recently had the flu (symptoms started last Friday). She has an occasional cough still but reports her breathing today is an 8/10. She has not needed her inhalers and hasn't taken any today. Given report of good breathing, proceeded with PFTs knowing she recently had the flu.    Medication adherence reported as using medications as needed. She does not like the thought of using medications because she doesn't want her lungs to get used to the inhalers.  Current COPD medications: Advair HFA (fluticasone-salmeterol) 115-21 mcg/act - 2 puffs BID (she is taking PRN), albuterol PRN Rescue inhaler use frequency: 2x/week Patient exacerbation hx: none  O: Physical Exam Vitals reviewed.  Constitutional:      Appearance: She is obese.  Cardiovascular:      Rate and Rhythm: Normal rate.  Pulmonary:     Comments: Occasional coughing prior to PFTs. PFTs triggered more coughing/loosening of congestion.  Neurological:     Mental Status: She is alert.  Psychiatric:        Mood and Affect: Mood normal.        Behavior: Behavior normal.        Thought Content: Thought content normal.    Review of Systems  Respiratory:  Positive for cough and shortness of breath (with exertion).        Getting over recent flu. Reports breathing today is 8/10.   All other systems reviewed and are negative.  Vitals:   10/20/21 0906  Pulse: 72  SpO2: 98%   See "scanned report" or Documentation Flowsheet (discrete results - PFTs) for Spirometry results. Patient provided good effort while attempting spirometry.   Lung Age = 1  A/P: Patient has been experiencing SOB for about a year and taking Advair and albuterol as needed. Spirometry evaluation with pre- and post-bronchodilator reveals normal lung function. No evidence of COPD. Suspect SOB may be related to deconditioning. She can further discuss results and need for any medication changes at her visit with Dr. Volanda Napoleon next week.  -Continue current medications: Advair HFA (fluticasone-salmeterol) 115-21 mcg/act - 2 puffs BID (she is taking PRN), albuterol PRN - consider adjustment with PCP. -Reviewed results of pulmonary function tests. Pt verbalized understanding of results and education.    -Encouraged her to start walking more to increase  her exercise capacity.  -Congratulated her on her smoking cessation and encouraged her to keep up the good work.  Appears to be an excellent candidate for long-term tobacco cessation.   Written pt instructions provided. F/U Clinic visit with PCP on 10/27/21.    Total time in face to face counseling 30 minutes.  Patient seen with Rebbeca Paul, PharmD - PGY2 Pharmacy Resident.

## 2021-10-27 ENCOUNTER — Ambulatory Visit: Payer: No Typology Code available for payment source | Admitting: Family Medicine

## 2021-10-31 NOTE — Progress Notes (Deleted)
NEUROLOGY FOLLOW UP OFFICE NOTE  Deanna Scott [id number]  Assessment/Plan:   Chronic headache Dizziness Pulsatile tinnitus History of CSF leak  Migraine prevention:  *** Migraine rescue:  *** Limit use of pain relievers to no more than 2 days out of week to prevent risk of rebound or medication-overuse headache. Keep headache diary Follow up ***   Subjective:  Deanna Scott is a 50 year old right-handed female who follows up for headaches.  She is accompanied by her daughter who supplements history.  UPDATE: TSH on 04/22/2021 was 2.510.  Patient underwent LP on 05/06/2021 which demonstrated an opening pressure of 22 cm water.  She was started on topiramate, currently taking 63m at bedtime.  She did not have a formal eye exam.  ***.  She began experiencing paresthesias ***.  Repeat TSH on 09/19/2021 was 1.620, B12 1,646 and negative RPR.     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  Etiology uncertain but increased intracranial pressure was suspected.  Eye exam for papilledema was negative.  She saw ENT and underwent endoscopic septoplasty and CSF leak repair on 01/13/2020.  She had been doing fine until September-October 2021.  She started experiencing dizziness and left sided pulsatile tinnitus as well as onset of headaches, Headaches are a dull occipital pressure/tightness that may become diffuse.  It is not positional.  She has associated dizziness described as unsteadiness with nausea but sometimes occurs without the headache.  No visual obscurations.  These are different than her typical migraines.  They became severe in March-April 2022.  She was seen in the ED on 02/12/2021.  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  Headaches and dizziness have  improved but still present.  Headaches occur a couple of times a week.  Dizziness occurs 3 to 4 times a week, lasting less than an hour.   She had a sleep study in August 2021 which was normal.  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 30 tablet 0   albuterol (VENTOLIN HFA) 108 (90 Base) MCG/ACT inhaler Inhale 1 puff into the lungs every 4 (four) hours as needed for wheezing or shortness of breath. 18 g 0   aspirin-acetaminophen-caffeine (EXCEDRIN MIGRAINE) 2476-546-50MG per tablet Take 1 tablet by mouth every 6 (six) hours as needed for pain. (Patient not taking: Reported on 10/20/2021)     BLACK CURRANT SEED OIL PO Take by mouth daily.     Blood Pressure KIT Check blood pressure 2-3 times week 1 kit 0   fluticasone (FLONASE) 50 MCG/ACT nasal spray  Place 1 spray into both nostrils daily. (Patient not taking: Reported on 10/20/2021)     fluticasone-salmeterol (ADVAIR HFA) 115-21 MCG/ACT inhaler Inhale 2 puffs into the lungs 2 (two) times daily. (Patient not taking: Reported on 10/20/2021) 12 g 0   Multiple Vitamin (MULTIVITAMIN ADULT PO) Take 1 tablet by mouth daily. (Patient not taking: Reported on 04/19/2021)     Omega 3 1000 MG CAPS Take 2 capsules by mouth in the morning and at bedtime.     omeprazole (PRILOSEC OTC) 20 MG tablet Take 1 tablet (20 mg total) by  mouth daily. 30 tablet 0   polyethylene glycol powder (GLYCOLAX/MIRALAX) 17 GM/SCOOP powder Take by mouth as needed.     vitamin B-12 (CYANOCOBALAMIN) 500 MCG tablet Take by mouth.     [DISCONTINUED] sodium chloride (OCEAN) 0.65 % SOLN nasal spray Place 2 sprays into both nostrils as needed for congestion.     No current facility-administered medications on file prior to visit.    ALLERGIES: Allergies  Allergen Reactions   Latex Itching and Dermatitis   Hydrocodone-Acetaminophen Other (See Comments)    CNS dysphoria   Oxycodone-Acetaminophen Other (See Comments)    CNS Dysphoria    FAMILY HISTORY: Family History  Problem Relation Age of Onset   Cancer Father    Diabetes Maternal Grandmother    Other Mother        balance issues from a "thing in her brain" requires walker   Colon cancer Mother 68       dx 2019   Rectal cancer Neg Hx    Stomach cancer Neg Hx       Objective:  *** General: No acute distress.  Patient appears ***-groomed.   Head:  Normocephalic/atraumatic Eyes:  Fundi examined but not visualized Neck: supple, no paraspinal tenderness, full range of motion Heart:  Regular rate and rhythm Lungs:  Clear to auscultation bilaterally Back: No paraspinal tenderness Neurological Exam: alert and oriented to person, place, and time.  Speech fluent and not dysarthric, language intact.  CN II-XII intact. Bulk and tone normal, muscle strength 5/5 throughout.  Sensation to light touch intact.  Deep tendon reflexes 2+ throughout, toes downgoing.  Finger to nose testing intact.  Gait normal, Romberg negative.   Metta Clines, DO  CC: Deanna Leitz, MD

## 2021-11-01 ENCOUNTER — Ambulatory Visit: Payer: Self-pay | Admitting: Neurology

## 2021-11-22 ENCOUNTER — Ambulatory Visit: Payer: No Typology Code available for payment source

## 2021-12-01 ENCOUNTER — Ambulatory Visit: Payer: No Typology Code available for payment source

## 2022-03-13 ENCOUNTER — Encounter (HOSPITAL_COMMUNITY): Payer: Self-pay | Admitting: Emergency Medicine

## 2022-03-13 ENCOUNTER — Ambulatory Visit (HOSPITAL_COMMUNITY)
Admission: EM | Admit: 2022-03-13 | Discharge: 2022-03-13 | Disposition: A | Discharge status: Home / Self Care | Payer: No Typology Code available for payment source | Attending: Emergency Medicine | Admitting: Emergency Medicine

## 2022-03-13 DIAGNOSIS — R109 Unspecified abdominal pain: Secondary | ICD-10-CM

## 2022-03-13 LAB — POCT URINALYSIS DIPSTICK, ED / UC
Bilirubin Urine: NEGATIVE
Glucose, UA: NEGATIVE mg/dL
Hgb urine dipstick: NEGATIVE
Ketones, ur: NEGATIVE mg/dL
Leukocytes,Ua: NEGATIVE
Nitrite: NEGATIVE
Protein, ur: NEGATIVE mg/dL
Specific Gravity, Urine: 1.025 (ref 1.005–1.030)
Urobilinogen, UA: 0.2 mg/dL (ref 0.0–1.0)
pH: 5.5 (ref 5.0–8.0)

## 2022-03-13 MED ORDER — DICLOFENAC SODIUM 50 MG PO TBEC: 50.0000 mg | DELAYED_RELEASE_TABLET | Freq: Two times a day (BID) | ORAL | 0 refills | Status: DC

## 2022-03-13 NOTE — ED Triage Notes (Signed)
Pt is present today with right side pain, urine frequency, dysuria, and lower back pain. Pt sx started x5 days ago  ?

## 2022-03-13 NOTE — ED Provider Notes (Signed)
?[facility] ? ? ? ?CSN: [id number] ?Arrival date & time: 03/13/22  1820 ? ? ?  ? ?History   ?Chief Complaint ?Chief Complaint  ?Patient presents with  ? Flank Pain  ? Dysuria  ? Back Pain  ? ? ?HPI ?Deanna Scott is a 51 y.o. female.  ? ?Patient presents with concerns of right side pain for the past few days. She reports it is somewhat constant but she mainly notices it with movement or when laying on her right side. She describes it as kind of crampy. The patient reports one isolated episode of dysuria but denies any frequency, hematuria, or urgency. She states the pain is mainly on the right side but sometimes spreads towards her back and down around the front of her hip. The patient denies abdominal pain, nausea, vomiting, diarrhea, constipation, or blood in stool. She denies any known injury or change in activity or prior similar.  ? ?The history is provided by the patient.  ?Flank Pain ?Pertinent negatives include no abdominal pain.  ?Dysuria ?Associated symptoms: flank pain   ?Associated symptoms: no abdominal pain, no fever, no nausea, no vaginal discharge and no vomiting   ?Back Pain ?Associated symptoms: dysuria   ?Associated symptoms: no abdominal pain and no fever   ? ?Past Medical History:  ?Diagnosis Date  ? Abnormal Pap smear   ? Allergy   ? latex  ? Elevated blood pressure reading 09/06/2021  ? GERD (gastroesophageal reflux disease)   ? Migraines   ? otc meds prn  ? Plantar fasciitis, bilateral   ? Seasonal allergies   ? Shortness of breath   ? with exercise - smoker  ? Shoulder pain, right   ? otc meds  ? 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 Qualifier: Diagnosis of  By: Dianah Field MD, Marcello Moores    ? ? ?Patient Active Problem List  ? Diagnosis Date Noted  ? Prediabetes 10/05/2021  ? Dyslipidemia 10/05/2021  ? HTN (hypertension) 09/19/2021  ? Hyperlipidemia 09/06/2021  ? Headache 02/01/2021  ? BV (bacterial vaginosis) 11/11/2020  ? Snoring 06/08/2020  ? Pinched nerve 06/07/2020  ? 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  ?  Extremity numbness 10/24/2019  ? Shortness of breath 10/23/2019  ? Lateral epicondylitis 10/08/2019  ? Fatigue 10/08/2019  ? Screening breast examination 10/07/2019  ? Triceps tendonitis 07/21/2019  ? Low back pain 06/18/2019  ? Chronic sinusitis 10/18/2018  ? Benign paroxysmal positional vertigo 10/18/2018  ? Vaginal wall prolapse 07/29/2017  ? Migraines 07/27/2017  ? GERD (gastroesophageal reflux disease) 07/29/2014  ? OBESITY, NOS 01/03/2007  ? ? ?Past Surgical History:  ?Procedure Laterality Date  ? ABDOMINAL HYSTERECTOMY    ? BLADDER SUSPENSION  2009  ? DIAGNOSTIC LAPAROSCOPY    ? ectopic pregnancy  ? DILATION AND CURETTAGE OF UTERUS    ? hx mab  ? endoscopy nasal sinus surgery  01/13/2020  ? [facility] - CFS Leak Repair  ? SACROSPINOUS LIGAMENT FIXATION    ? posterior repair wtih cysto  ? SVD    ? x 2  ? TUBAL LIGATION  1999  ? VAGINAL HYSTERECTOMY  03/13/2012  ? Procedure: HYSTERECTOMY VAGINAL;  Surgeon: Mora Bellman, MD;  Location: [facility];  Service: Gynecology;  Laterality: N/A;  ? ? ?OB History   ? ? Gravida  ?4  ? Para  ?2  ? Term  ?2  ? Preterm  ?   ? AB  ?2  ? Living  ?2  ?  ? ? SAB  ?1  ? IAB  ?   ?  Ectopic  ?1  ? Multiple  ?   ? Live Births  ?   ?   ?  ?  ? ? ? ?Home Medications   ? ?Prior to Admission medications   ?Medication Sig Start Date End Date Taking? Authorizing Provider  ?acetaminophen (TYLENOL 8 HOUR) 650 MG CR tablet Take 2 tablets (1,300 mg total) by mouth every 8 (eight) hours as needed for pain. 06/16/19   Nuala Alpha, MD  ?albuterol (VENTOLIN HFA) 108 (90 Base) MCG/ACT inhaler Inhale 1 puff into the lungs every 4 (four) hours as needed for wheezing or shortness of breath. 09/19/21   Simmons-Robinson, Riki Sheer, MD  ?aspirin-acetaminophen-caffeine (EXCEDRIN MIGRAINE) [phone] MG per tablet Take 1 tablet by mouth every 6 (six) hours as needed for pain. ?Patient not taking: Reported on 10/20/2021    [provider]  ?BLACK CURRANT SEED OIL PO Take by mouth daily.    [provider]  ?Blood Pressure KIT Check blood pressure 2-3 times week 09/05/21   Carollee Leitz, MD  ?diclofenac (VOLTAREN) 50 MG EC tablet Take 1 tablet (50 mg total) by mouth 2 (two) times daily. 03/13/22   Abner Greenspan, Qiara Minetti L, PA  ?fluticasone (FLONASE) 50 MCG/ACT nasal spray Place 1 spray into both nostrils daily. ?Patient not taking: Reported on 10/20/2021 09/22/20   [provider]  ?fluticasone-salmeterol (ADVAIR HFA) 115-21 MCG/ACT inhaler Inhale 2 puffs into the lungs 2 (two) times daily. ?Patient not taking: Reported on 10/20/2021 09/19/21   Simmons-Robinson, Riki Sheer, MD  ?Multiple Vitamin (MULTIVITAMIN ADULT PO) Take 1 tablet by mouth daily. ?Patient not taking: Reported on 04/19/2021    [provider]  ?Omega 3 1000 MG CAPS Take 2 capsules by mouth in the morning and at bedtime.    [provider]  ?omeprazole (PRILOSEC OTC) 20 MG tablet Take 1 tablet (20 mg total) by mouth daily. 07/09/21 10/20/21  Lynden Oxford Scales, PA-C  ?polyethylene glycol powder (GLYCOLAX/MIRALAX) 17 GM/SCOOP powder Take by mouth as needed. 01/15/20   [provider]  ?vitamin B-12 (CYANOCOBALAMIN) 500 MCG tablet Take by mouth.    [provider]  ?sodium chloride (OCEAN) 0.65 % SOLN nasal spray Place 2 sprays into both nostrils as needed for congestion.  06/11/20  [provider]  ? ? ?Family History ?Family History  ?Problem Relation Age of Onset  ? Cancer Father   ? Diabetes Maternal Grandmother   ? Other Mother   ?     balance issues from a "thing in her brain" requires walker  ? Colon cancer Mother 45  ?     dx 2019  ? Rectal cancer Neg Hx   ? Stomach cancer Neg Hx   ? ? ?Social History ?Social History  ? ?Tobacco Use  ? Smoking status: Former  ?  Packs/day: 1.00  ?  Years: 34.00  ?  Pack years: 34.00  ?  Types: Cigarettes  ?  Quit date: 09/04/2015  ?  Years since quitting: 6.5  ? Smokeless tobacco: Never  ? Tobacco comments:  ?  Started age 4 - quit age 64  ?Vaping Use  ? Vaping  Use: Former  ?Substance Use Topics  ? Alcohol use: Yes  ?  Alcohol/week: 2.0 standard drinks  ?  Types: 2 Standard drinks or equivalent per week  ?  Comment: socially  ? Drug use: No  ? ? ? ?Allergies   ?Latex, Hydrocodone-acetaminophen, and Oxycodone-acetaminophen ? ? ?Review of Systems ?Review of Systems  ?Constitutional:  Negative for  fatigue and fever.  ?Gastrointestinal:  Negative for abdominal pain, diarrhea, nausea and vomiting.  ?Genitourinary:  Positive for dysuria and flank pain. Negative for difficulty urinating, frequency, hematuria, urgency, vaginal discharge and vaginal pain.  ?Musculoskeletal:  Positive for back pain.  ?Skin:  Negative for rash.  ? ? ?Physical Exam ?Triage Vital Signs ?ED Triage Vitals [03/13/22 1857]  ?Enc Vitals Group  ?   BP (!) 139/92  ?   Pulse Rate 62  ?   Resp 18  ?   Temp (!) 97.5 ?F (36.4 ?C)  ?   Temp Source Oral  ?   SpO2 99 %  ?   Weight   ?   Height   ?   Head Circumference   ?   Peak Flow   ?   Pain Score 0  ?   Pain Loc   ?   Pain Edu?   ?   Excl. in [place]?   ? ?No data found. ? ?Updated Vital Signs ?BP (!) 139/92 (BP Location: Right Arm)   Pulse 62   Temp (!) 97.5 ?F (36.4 ?C) (Oral)   Resp 18   LMP 03/02/2012   SpO2 99%  ? ?Visual Acuity ?Right Eye Distance:   ?Left Eye Distance:   ?Bilateral Distance:   ? ?Right Eye Near:   ?Left Eye Near:    ?Bilateral Near:    ? ?Physical Exam ?Vitals and nursing note reviewed.  ?Constitutional:   ?   General: She is not in acute distress. ?Eyes:  ?   Pupils: Pupils are equal, round, and reactive to light.  ?Cardiovascular:  ?   Rate and Rhythm: Normal rate and regular rhythm.  ?   Heart sounds: Normal heart sounds.  ?Pulmonary:  ?   Effort: Pulmonary effort is normal.  ?   Breath sounds: Normal breath sounds.  ?Abdominal:  ?   General: Bowel sounds are normal.  ?   Palpations: Abdomen is soft.  ?   Tenderness: There is no abdominal tenderness. There is no right CVA tenderness, left CVA tenderness, guarding or rebound.  ?    Comments: Points to right lateral flank above iliac crest as main area of pain. No tenderness. No back or abdominal tenderness, including RLQ. Full ROM right hip without worsening pain. Negative SLR.   ?Neurologic

## 2022-03-13 NOTE — Discharge Instructions (Addendum)
Your pain is likely muscular. Take medication as prescribed. Recommend heat, gentle stretching, and massage. You declined muscle relaxer today. Follow-up with PCP if no improvement in a week. Go to the ER if develop worsening, severe pain, or new concerning symptoms such as fever, bowel changes, or vomiting.  ?

## 2022-03-15 ENCOUNTER — Ambulatory Visit: Payer: No Typology Code available for payment source | Admitting: Family Medicine

## 2022-06-06 ENCOUNTER — Other Ambulatory Visit: Payer: Self-pay

## 2022-06-06 ENCOUNTER — Encounter (HOSPITAL_COMMUNITY): Payer: Self-pay | Admitting: Emergency Medicine

## 2022-06-06 ENCOUNTER — Emergency Department (HOSPITAL_COMMUNITY): Payer: No Typology Code available for payment source

## 2022-06-06 ENCOUNTER — Emergency Department (HOSPITAL_COMMUNITY)
Admission: EM | Admit: 2022-06-06 | Discharge: 2022-06-06 | Disposition: A | Discharge status: Home / Self Care | Payer: Self-pay | Attending: Emergency Medicine | Admitting: Emergency Medicine

## 2022-06-06 DIAGNOSIS — R519 Headache, unspecified: Secondary | ICD-10-CM | POA: Insufficient documentation

## 2022-06-06 DIAGNOSIS — R197 Diarrhea, unspecified: Secondary | ICD-10-CM | POA: Insufficient documentation

## 2022-06-06 DIAGNOSIS — Z7982 Long term (current) use of aspirin: Secondary | ICD-10-CM | POA: Insufficient documentation

## 2022-06-06 DIAGNOSIS — R072 Precordial pain: Secondary | ICD-10-CM | POA: Insufficient documentation

## 2022-06-06 DIAGNOSIS — R42 Dizziness and giddiness: Secondary | ICD-10-CM | POA: Insufficient documentation

## 2022-06-06 DIAGNOSIS — Z91040 Latex allergy status: Secondary | ICD-10-CM | POA: Insufficient documentation

## 2022-06-06 DIAGNOSIS — R0602 Shortness of breath: Secondary | ICD-10-CM | POA: Insufficient documentation

## 2022-06-06 DIAGNOSIS — R079 Chest pain, unspecified: Secondary | ICD-10-CM

## 2022-06-06 LAB — CBC
HCT: 43.8 % (ref 36.0–46.0)
Hemoglobin: 14.3 g/dL (ref 12.0–15.0)
MCH: 29.9 pg (ref 26.0–34.0)
MCHC: 32.6 g/dL (ref 30.0–36.0)
MCV: 91.4 fL (ref 80.0–100.0)
Platelets: 242 10*3/uL (ref 150–400)
RBC: 4.79 MIL/uL (ref 3.87–5.11)
RDW: 13.7 % (ref 11.5–15.5)
WBC: 8.4 10*3/uL (ref 4.0–10.5)
nRBC: 0 % (ref 0.0–0.2)

## 2022-06-06 LAB — I-STAT BETA HCG BLOOD, ED (MC, WL, AP ONLY): I-stat hCG, quantitative: <5 m[IU]/mL (ref <5)

## 2022-06-06 LAB — BASIC METABOLIC PANEL
Anion gap: 8 (ref 5–15)
BUN: 14 mg/dL (ref 6–20)
CO2: 24 mmol/L (ref 22–32)
Calcium: 9.3 mg/dL (ref 8.9–10.3)
Chloride: 107 mmol/L (ref 98–111)
Creatinine, Ser: 0.72 mg/dL (ref 0.44–1.00)
GFR, Estimated: >60 mL/min (ref >60)
Glucose, Bld: 187 mg/dL — ABNORMAL HIGH (ref 70–99)
Potassium: 3.9 mmol/L (ref 3.5–5.1)
Sodium: 139 mmol/L (ref 135–145)

## 2022-06-06 LAB — D-DIMER, QUANTITATIVE: D-Dimer, Quant: 0.3 ug/mL-FEU (ref 0.00–0.50)

## 2022-06-06 LAB — TROPONIN I (HIGH SENSITIVITY)
Troponin I (High Sensitivity): 3 ng/L (ref <18)
Troponin I (High Sensitivity): 2 ng/L (ref <18)

## 2022-06-06 NOTE — Discharge Instructions (Signed)
You presented with chest pain, and shortness of breath.  We did many tests and they all came back normal.  We will discharge you and place a referral to cardiology so they can see you in the clinic and do further testing if needed.  I would like you to follow-up with a primary care doctor this week as they can help you see the cardiologist faster.  You can take Tylenol, ibuprofen as needed for pain and limit your activity by the pain.  If you get chest pain that does not go away with rest or chest pain severe in nature or if it occurs with radiation to the arm or jaw or sweating please come back to the ED for evaluation or call 911.  If you get shortness of breath that does not go away, please come to the ED for evaluation or call 911.

## 2022-06-06 NOTE — ED Provider Notes (Signed)
[facility] DEPT Provider Note   CSN: [id number] Arrival date & time: 06/06/22  0945     History  Chief Complaint  Patient presents with   Shortness of Breath   Chest Pain   Dizziness    Deanna Scott is a 51 y.o. female with PMHx of Prediabetes, migraines, GERD, hyperlipidemia, Class II obesity.   Patient presented with chest pain that occurred with exertion.  It is intermittent.  Status varies from tightness to sharp pain and severity up to 8 out of 10 at its worst.  This has been bothersome to her for the last 3 months along with shortness of breath, dizziness, and pulsatile headache.  She states the pain does not radiate to her arms or jaw but does radiate to her back.  She states Saturday she had episode that lasted about 30 minutes.  She was exerting herself at that time as well.  She denies any nausea, vomiting or abdominal pain during these episodes.  She does not take any prescription medications and has been told her blood pressure has been elevated previously.  She wanted to work on her diet and exercise to get that down.   Shortness of Breath Severity:  Unable to specify Onset quality:  Gradual Duration:  3 months Timing:  Intermittent Progression:  Waxing and waning Chronicity:  Recurrent Context: activity   Relieved by:  Lying down and rest Worsened by:  Exertion, activity and movement Associated symptoms: chest pain and headaches   Associated symptoms: no abdominal pain, no cough, no diaphoresis, no fever, no rash, no sore throat, no vomiting and no wheezing   Chest pain:    Quality: pressure and sharp     Severity:  Unable to specify   Onset quality:  Gradual   Duration:  3 months   Timing:  Intermittent   Progression:  Waxing and waning   Chronicity:  Recurrent Risk factors: obesity and tobacco use   Chest Pain Pain location:  Substernal area and L chest Pain quality: pressure and sharp   Pain radiates to:  Mid back Pain  severity:  Unable to specify Associated symptoms: dizziness, headache and shortness of breath   Associated symptoms: no abdominal pain, no back pain, no cough, no diaphoresis, no fever and no vomiting   Dizziness Associated symptoms: chest pain, diarrhea, headaches and shortness of breath   Associated symptoms: no vomiting        Home Medications Prior to Admission medications   Medication Sig Start Date End Date Taking? Authorizing Provider  acetaminophen (TYLENOL 8 HOUR) 650 MG CR tablet Take 2 tablets (1,300 mg total) by mouth every 8 (eight) hours as needed for pain. 06/16/19   Nuala Alpha, MD  albuterol (VENTOLIN HFA) 108 (90 Base) MCG/ACT inhaler Inhale 1 puff into the lungs every 4 (four) hours as needed for wheezing or shortness of breath. 09/19/21   Simmons-Robinson, Riki Sheer, MD  aspirin-acetaminophen-caffeine (EXCEDRIN MIGRAINE) [phone] MG per tablet Take 1 tablet by mouth every 6 (six) hours as needed for pain. Patient not taking: Reported on 10/20/2021    [provider]  BLACK CURRANT SEED OIL PO Take by mouth daily.    [provider]  Blood Pressure KIT Check blood pressure 2-3 times week 09/05/21   Carollee Leitz, MD  diclofenac (VOLTAREN) 50 MG EC tablet Take 1 tablet (50 mg total) by mouth 2 (two) times daily. 03/13/22   Abner Greenspan, Amy L, PA  fluticasone (FLONASE) 50 MCG/ACT nasal spray Place 1  spray into both nostrils daily. Patient not taking: Reported on 10/20/2021 09/22/20   [provider]  fluticasone-salmeterol (ADVAIR HFA) 115-21 MCG/ACT inhaler Inhale 2 puffs into the lungs 2 (two) times daily. Patient not taking: Reported on 10/20/2021 09/19/21   Simmons-Robinson, Riki Sheer, MD  Multiple Vitamin (MULTIVITAMIN ADULT PO) Take 1 tablet by mouth daily. Patient not taking: Reported on 04/19/2021    [provider]  Omega 3 1000 MG CAPS Take 2 capsules by mouth in the morning and at bedtime.    [provider]  omeprazole  (PRILOSEC OTC) 20 MG tablet Take 1 tablet (20 mg total) by mouth daily. 07/09/21 10/20/21  Lynden Oxford Scales, PA-C  polyethylene glycol powder (GLYCOLAX/MIRALAX) 17 GM/SCOOP powder Take by mouth as needed. 01/15/20   [provider]  vitamin B-12 (CYANOCOBALAMIN) 500 MCG tablet Take by mouth.    [provider]  sodium chloride (OCEAN) 0.65 % SOLN nasal spray Place 2 sprays into both nostrils as needed for congestion.  06/11/20  [provider]      Allergies    Latex, Hydrocodone-acetaminophen, and Oxycodone-acetaminophen    Review of Systems   Review of Systems  Constitutional:  Negative for diaphoresis and fever.  HENT:  Negative for sore throat.   Eyes:  Negative for pain and redness.  Respiratory:  Positive for chest tightness and shortness of breath. Negative for cough and wheezing.   Cardiovascular:  Positive for chest pain and leg swelling.  Gastrointestinal:  Positive for diarrhea. Negative for abdominal distention, abdominal pain and vomiting.  Endocrine: Positive for polydipsia and polyuria.  Genitourinary:  Negative for dysuria and flank pain.  Musculoskeletal:  Negative for arthralgias and back pain.  Skin:  Negative for rash and wound.  Neurological:  Positive for dizziness and headaches.  Psychiatric/Behavioral:  Negative for agitation, behavioral problems and confusion.     Physical Exam Updated Vital Signs BP (!) 168/95   Pulse (!) 55   Temp 98 F (36.7 C) (Oral)   Resp 13   LMP 03/02/2012   SpO2 100%  Physical Exam: Gen: Obese female in NAD HENT: NCAT, MMM CV: RRR, 2+ pulses in all extremities, No LE edema Lung: CTAB, no wheeze, rhonchi or rales Abd: No TTP, increased bowel sounds, soft MSK: No asymmetry Skin: No rash or wound on exposed skin Neuro: Alert and oriented x4 Psych: Normal mood and normal affect  ED Results / Procedures / Treatments   Labs (all labs ordered are listed, but only abnormal results are  displayed) Labs Reviewed  BASIC METABOLIC PANEL  CBC  I-STAT BETA HCG BLOOD, ED ([facility], [facility], [facility] ONLY)  TROPONIN I (HIGH SENSITIVITY)    EKG None  Radiology No results found.  Procedures Procedures    Medications Ordered in ED Medications - No data to display  ED Course/ Medical Decision Making/ A&P                           Medical Decision Making Patient presented with chest pain, shortness of breath, dizziness that has been chronic in nature.  She states that worsening on Saturday causing her to present to the ED today.  Differential diagnoses include ACS versus pulmonary embolism versus stable angina versus severe symptomatic hypertension.  ACS and PE were most concerning and ruled out with negative troponin, negative EKG and negative D-dimer.  Heart score was calculated and it was low at 3.  A chronic nature of these episodes is  suspicions for underlying CAD leading to stable angina.   This is more likely given her multiple comorbidities and not taking any medications to prevent their progression. All of her episodes have been exertional in nature.  Her blood pressure was elevated on presentation but it has gradually came down.  She was significantly hypertensive on admission which may explain some of her symptoms but it is important to rule out underlying coronary artery disease.  She will benefit from cardiology follow-up as an outpatient. -Cardiology referral placed -Tylenol, Ibuprofen for pain and activity as allowed by pain -Recommend PCP follow-up in 1 week -Return precautions given    Amount and/or Complexity of Data Reviewed External Data Reviewed: labs, radiology, ECG and notes.    Details: Previous data was reviewed independently. Labs: ordered.    Details: CBC, BMP, Troponin x2, D-dimer, hCG was ordered and reviewed indpendently. Radiology: ordered and independent interpretation performed.    Details: CXR was ordered and reviewed independently and showed no active  disease process. ECG/medicine tests: ordered and independent interpretation performed.    Details: No ischemic changes noted on EKG.           Final Clinical Impression(s) / ED Diagnoses Final diagnoses:  None    Rx / DC Orders ED Discharge Orders     None      Idamae Schuller, MD Tillie Rung. [facility] Internal Medicine Residency, PGY-2   Idamae Schuller, MD 06/06/22 1407    Blanchie Dessert, MD 06/06/22 1520

## 2022-06-06 NOTE — ED Triage Notes (Signed)
Pt reports chest pain, SHOB, and dizziness that began Saturday. Pt reports sharp pains in her left chest that are not radiating. Pt stated it comes in waves but makes her nauseated.

## 2022-06-07 NOTE — Progress Notes (Unsigned)
  SUBJECTIVE:   CHIEF COMPLAINT / HPI:   Hypertension: BP: (!) 132/98 today. Home medications include: none, was on amlodipine '5mg'$  briefly. {Blank single:19197::"Does","Does not"} check blood pressure at home.*** Denies any SOB, CP, vision changes, LE edema, medication SEs, or symptoms of hypotension. Diet ***. Exercise ***. Most recent creatinine trend:  Lab Results  Component Value Date   CREATININE 0.72 06/06/2022   CREATININE 0.68 09/05/2021   CREATININE 0.79 09/26/2018   Patient has had a BMP in the past 1 year.   Hyperlipidemia: LDL elevated >110. Attempted lifestyle modifications. ***  Chest pain: went to ED on 8/1 with chest pain with exertion. Workup proved unremarkable but felt that she would benefit from cardiology referral given concern for underlying CAD and stable angina. ***  PERTINENT  PMH / PSH: GERD, prediabetes, tobacco use  OBJECTIVE:  BP (!) 132/98   Pulse 74   Ht '5\' 3"'$  (1.6 m)   Wt 216 lb (98 kg)   LMP 03/02/2012   SpO2 98%   BMI 38.26 kg/m   General: NAD, pleasant, able to participate in exam Cardiac: RRR, no murmurs auscultated Respiratory: CTAB, normal WOB Abdomen: soft, non-tender, non-distended, normoactive bowel sounds Extremities: warm and well perfused, no edema or cyanosis Skin: warm and dry, no rashes noted Neuro: alert, no obvious focal deficits, speech normal Psych: Normal affect and mood  ASSESSMENT/PLAN:  No problem-specific Assessment & Plan notes found for this encounter.   No orders of the defined types were placed in this encounter.  No orders of the defined types were placed in this encounter.  No follow-ups on file. Wells Guiles, DO 06/08/2022, 2:48 PM PGY-***, [facility] {    This will disappear when note is signed, click to select method of visit    :1}

## 2022-06-08 ENCOUNTER — Ambulatory Visit (INDEPENDENT_AMBULATORY_CARE_PROVIDER_SITE_OTHER): Payer: Self-pay | Admitting: Student

## 2022-06-08 ENCOUNTER — Other Ambulatory Visit: Payer: Self-pay

## 2022-06-08 VITALS — BP 132/98 | HR 74 | Ht 63.0 in | Wt 216.0 lb

## 2022-06-08 DIAGNOSIS — R079 Chest pain, unspecified: Secondary | ICD-10-CM

## 2022-06-08 DIAGNOSIS — E785 Hyperlipidemia, unspecified: Secondary | ICD-10-CM

## 2022-06-08 DIAGNOSIS — G8929 Other chronic pain: Secondary | ICD-10-CM

## 2022-06-08 DIAGNOSIS — Z9189 Other specified personal risk factors, not elsewhere classified: Secondary | ICD-10-CM

## 2022-06-08 DIAGNOSIS — Z9109 Other allergy status, other than to drugs and biological substances: Secondary | ICD-10-CM

## 2022-06-08 DIAGNOSIS — I209 Angina pectoris, unspecified: Secondary | ICD-10-CM | POA: Insufficient documentation

## 2022-06-08 DIAGNOSIS — I10 Essential (primary) hypertension: Secondary | ICD-10-CM

## 2022-06-08 DIAGNOSIS — R7303 Prediabetes: Secondary | ICD-10-CM

## 2022-06-08 LAB — POCT GLYCOSYLATED HEMOGLOBIN (HGB A1C): HbA1c, POC (prediabetic range): 6.1 % (ref 5.7–6.4)

## 2022-06-08 MED ORDER — LOSARTAN POTASSIUM 25 MG PO TABS
25.0000 mg | ORAL_TABLET | Freq: Every day | ORAL | 0 refills | Status: DC
  Filled 2022-06-08: qty 90, 90d supply, fill #0

## 2022-06-08 MED ORDER — ATORVASTATIN CALCIUM 40 MG PO TABS
40.0000 mg | ORAL_TABLET | Freq: Every day | ORAL | 3 refills | Status: DC
  Filled 2022-06-08: qty 90, 90d supply, fill #0

## 2022-06-08 NOTE — Assessment & Plan Note (Addendum)
The patient has poorly controlled HTN based on chart review and home blood pressures.  Notably, the patient presented to the ED with significant HTN as well.  Started patient on daily losartan 25 mg.  Recheck BMP in 2 weeks for medication associated hyperkalemia and creatinine changes.

## 2022-06-08 NOTE — Progress Notes (Addendum)
SUBJECTIVE:   CHIEF COMPLAINT: Chest pain  HPI:  Deanna Scott is a 51 y.o. female with a past medical history of prediabetes, HLD, and HTN presenting to the clinic for follow-up after ED visit on 06/06/2022 for acute worsening of chronic chest pain.   Chronic chest pain with high risk for CAD The patient presented to the ED on Tuesday, 06/06/2022 following 4 days of severe chest pain with exertion.  ACS and PE were most concerning and ruled out in the ED with negative troponin, negative EKG, and negative D-dimer.  Prior to this presentation, the patient reports having 2 months of short episodes of exertional chest pain that occurred 3-4 times per week and lasted for 1 to 2 minutes.  The pain was nonradiating and left-sided and associated with occasional nausea and headache.  Each episode was exertional in nature such as when going up steps or walking her dog.  Creatinine was at baseline and BMP was WNL in the ED.  Patient has a cardiology appointment scheduled for 06/09/2022.  Primary hypertension The patient reports that at home the past 2 mornings, her blood pressures have been 143/98 (HR 64) and 148/95 (HR 67).  Today, she denies SOB, vision changes, LE edema, and headache.  She is not currently taking any antihypertensives. Chart review shows that the patient has had multiple hypertensive blood pressures at office visits in 2022.  Hyperlipidemia The patient's LDL was elevated to 112 on 09/05/2022.  She is apprehensive about starting medications for her cholesterol and worries about her sister, who reportedly had joint pains after taking a statin. The patient is generally hesitant to take medications, but is also scared by her recent chest pain and visit to the ED and wants to improve.  Prediabetes The patient reports that she has a diet with lots of sweets and not a lot of vegetables.  She enjoys sugary beverages.  She previously improved her diet after her last visit to the clinic, but was  unable to maintain her lifestyle changes.  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  Sinus pressure The patient reports 1 to 2 days of nasal and forehead pressure sensation with some postnasal drip.  She denies fevers, chills, nausea/vomiting/diarrhea, sinus pain, rhinorrhea, sore throat, or cough.  The patient has been using a nasal rinse with purified water and saline packets at home for the past 2 days with mild relief.  PERTINENT  PMH / PSH:  -- GERD -- Prediabetes -- Tobacco use disorder -- HTN     Current Meds  Medication   Excedrin PRN   B-12 vitamins   Unknown OTC allergy med  Not taking any prescription medications currently.  Family History Problem Relation Age of Onset   Cancer Father    Diabetes Maternal Grandmother    Colon cancer Mother 15       dx 2019   Social History Tobacco Use   Smoking status: Former    Packs/day: 1.00    Years: 34.00    Total pack years: 34.00    Types: Cigarettes    Quit date: 09/04/2015    Years since quitting: 6.7   Smokeless tobacco: Never    Started age 66 - quit age 84  Substance and Sexual Activity   Alcohol use: Yes    Alcohol/week: 2.0 standard drinks of alcohol    Comment: socially   Drug use:  No  Social History Narrative   Lives at home with 2 dogs and 1 cat   Diet: The patient frequently eats candy and sweets as well as lots of carbs.  She drinks sweetened beverages and does not eat many vegetables. 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  OBJECTIVE:  BP (!) 132/98   Pulse 74   Ht '5\' 3"'$  (1.6 m)   Wt 216 lb (98 kg)   LMP 03/02/2012   SpO2 98%   BMI 38.26 kg/m    General: Age-appropriate, resting comfortably in chair, NAD, WNWD, alert and at baseline. HEENT: NCAT. PERRLA. Sclera without injection or icterus. MMM.  TMs normal. Neck: Supple. No LAD, thyroid smooth and not  palpable. Cardiovascular: Regular rate and rhythm. Normal S1/S2.  No murmurs appreciated Pulmonary: Clear bilaterally to ascultation. No increased WOB, no accessory muscle usage. No wheezes, rales, or crackles. Skin: Warm and dry. Extremities: No peripheral edema bilaterally.  Results for orders placed or performed in visit on 06/08/22 (from the past 48 hour(s))   Collection Time: 06/08/22  3:07 PM  Result Value   Hemoglobin A1C, POC 6.1     ASSESSMENT/PLAN:   Prediabetes HbA1c is 6.1 today compared to 6.2 on 09/05/2021.  Patient is hesitant to try medical therapy and it is appropriate to continue with dietary and exercise modification for this patient.  Discussed increasing frequency and intensity of walking to 5 times a week while trying to break a sweat.  Discussed limiting sweets and candies as well as sugary drinks and diet while still maintaining balance.  Hyperlipidemia Patient's LDL was 112 on 09/05/2021, above goal of <55 in CAD.  Will recheck lipids today and initiate therapy with daily atorvastatin 40 mg.  Chronic chest pain with high risk for CAD The patient's exertional chest pain history and recent work-up in the ED is consistent with stable angina, likely due to CAD.  She has multiple risk factors for coronary artery disease, including prediabetes, HLD, and HTN that are each currently untreated.  Her HEART score is 3, with a 6 week risk of major adverse cardiac event of 0.9-1.7%.  Lifestyle interventions were emphasized and patient was started on therapy for HLD and HTN with follow-up planned in 2 weeks.  HTN (hypertension) The patient has poorly controlled HTN based on chart review and home blood pressures.  Notably, the patient presented to the ED with significant HTN as well.  Started patient on daily losartan 25 mg.  Recheck BMP in 2 weeks for medication associated hyperkalemia and creatinine changes.  Environmental allergies The patient's sinus symptoms are most  consistent with environmental allergies.  Lack of fever, chills, rhinorrhea, sore throat, or cough suggest that patient does not have a sinus infection.  Recommended conservative treatment with continued nasal rinsing if desired and over-the-counter Flonase as well as current over-the-counter allergy medication.   Darryl Willner Mining engineer, [place]    I was personally present and performed or re-performed the history, physical exam and medical decision making activities of this service and have verified that the service and findings are accurately documented in the student's note.  Wells Guiles, DO                  06/09/2022, 8:41 AM

## 2022-06-08 NOTE — Assessment & Plan Note (Addendum)
The patient's exertional chest pain history and recent work-up in the ED is consistent with stable angina, likely due to CAD.  She has multiple risk factors for coronary artery disease, including prediabetes, HLD, and HTN that are each currently untreated.  Her HEART score is 3, with a 6 week risk of major adverse cardiac event of 0.9-1.7%.  Lifestyle interventions were emphasized and patient was started on therapy for HLD and HTN with follow-up planned in 2 weeks.

## 2022-06-08 NOTE — Assessment & Plan Note (Addendum)
Patient's LDL was 112 on 09/05/2021, above goal of <55 in CAD.  Will recheck lipids today and initiate therapy with daily atorvastatin 40 mg.

## 2022-06-08 NOTE — Assessment & Plan Note (Signed)
HbA1c is 6.1 today compared to 6.2 on 09/05/2021.  Patient is hesitant to try medical therapy and it is appropriate to continue with dietary and exercise modification for this patient.  Discussed increasing frequency and intensity of walking to 5 times a week while trying to break a sweat.  Discussed limiting sweets and candies as well as sugary drinks and diet while still maintaining balance.

## 2022-06-08 NOTE — Assessment & Plan Note (Signed)
The patient's sinus symptoms are most consistent with environmental allergies.  Lack of fever, chills, rhinorrhea, sore throat, or cough suggest that patient does not have a sinus infection.  Recommended conservative treatment with continued nasal rinsing if desired and over-the-counter Flonase as well as current over-the-counter allergy medication.

## 2022-06-08 NOTE — Patient Instructions (Addendum)
Is great today thank you for coming in to talk to [place] about your recent ED visit.  We are concerned about your symptoms and we want to start you on a couple of medications that are really important for your health.  We would also like you to make an effort to walk more at home to get some good exercise. Please also try to stay away from really sugary foods.  We will start you on a blood pressure medication called olmesartan 20 mg every day.  Please keep writing down your blood pressures in the mornings on a notebook or a big piece of paper.  We will also start you on a cholesterol medication had atorvastatin 40 mg every day.  It is a very safe medication.  Your diabetes number (A1c) is the same today as it was a year ago. Great job keeping it from going up! Working on diet and exercise should help you keep it down.  These medications will help protect your heart and make sure that you do not get any more pain or serious conditions.  Is very important that you plan we will take them and take them every day. C   We are sorry to hear about your sinus discomfort. We will offer you some Flonase and you can keep doing your nasal rinse with clean water.  If you start to produce some green snot, have significant pain in your sinuses or ears or a fever, please come back and see [place].  Otherwise, it is likely allergies contributing to your discomfort and this should feel better with time.  We will schedule you to see [place] in 2 weeks to make sure that all of these medications are working working well for you.  You will you will see your cardiologist tomorrow and they will look at your heart more to see what is going on with it.

## 2022-06-09 ENCOUNTER — Ambulatory Visit (INDEPENDENT_AMBULATORY_CARE_PROVIDER_SITE_OTHER): Payer: Self-pay | Admitting: Cardiovascular Disease

## 2022-06-09 ENCOUNTER — Other Ambulatory Visit: Payer: Self-pay

## 2022-06-09 ENCOUNTER — Encounter (HOSPITAL_BASED_OUTPATIENT_CLINIC_OR_DEPARTMENT_OTHER): Payer: Self-pay | Admitting: Cardiovascular Disease

## 2022-06-09 ENCOUNTER — Telehealth: Payer: Self-pay | Admitting: Student

## 2022-06-09 DIAGNOSIS — I209 Angina pectoris, unspecified: Secondary | ICD-10-CM

## 2022-06-09 DIAGNOSIS — E785 Hyperlipidemia, unspecified: Secondary | ICD-10-CM

## 2022-06-09 DIAGNOSIS — N644 Mastodynia: Secondary | ICD-10-CM

## 2022-06-09 HISTORY — DX: Mastodynia: N64.4

## 2022-06-09 LAB — LIPID PANEL
Chol/HDL Ratio: 5.6 ratio — ABNORMAL HIGH (ref 0.0–4.4)
Cholesterol, Total: 239 mg/dL — ABNORMAL HIGH (ref 100–199)
HDL: 43 mg/dL (ref >39)
LDL Chol Calc (NIH): 162 mg/dL — ABNORMAL HIGH (ref 0–99)
Triglycerides: 187 mg/dL — ABNORMAL HIGH (ref 0–149)
VLDL Cholesterol Cal: 34 mg/dL (ref 5–40)

## 2022-06-09 MED ORDER — NITROGLYCERIN 0.4 MG SL SUBL
0.4000 mg | SUBLINGUAL_TABLET | SUBLINGUAL | 3 refills | Status: DC | PRN
  Filled 2022-06-09: qty 25, 25d supply, fill #0

## 2022-06-09 NOTE — Telephone Encounter (Signed)
Called to discuss lipid panel. Unable to reach. Left HIPAA-compliant VM to continue medication and I would attempt to reach again. If unable, will send letter with results.

## 2022-06-09 NOTE — H&P (View-Only) (Signed)
Cardiology Office Note:    Date:  06/11/2022   ID:  Deanna Scott, DOB Jun 29, 1971, MRN [id number]  PCP:  Orvis Brill, DO   [facility] Providers Cardiologist:  Skeet Latch, MD     Referring MD: Orvis Brill, DO   No chief complaint on file.   History of Present Illness:    Deanna Scott is a 51 y.o. female with a hx of GERD and hyperlipidemia here for the evaluation of chest pain per ED referral.  She was seen in the ED 06/06/2022 for intermittent chest pain ongoing for several months but was becoming more persistent. Cardiac enzymes and d-dimer were normal. Lipids were elevated and they recommended outpatient follow up with cardiology.   Today, she is accompanied by her daughter. She states that her chest pain started a couple months ago. She has sharp pain that comes and goes. Her daughter recalls that she has said her pain is "intense but then fades away," generally lasting for about a minute. Her chest pain improves when she ceases activity. 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 On Sunday she went out to walk her dogs and experienced more chest pain. She has decreased her activity since then. She states that at times, she will become nauseous and short of breath due to exertional chest pain. Additionally, she has felt some pain in her left leg earlier today. She reports constant swelling in her legs. In the past, she has also felt some back pain. She also complains of bilateral breast pain, primarily on her left side. At baseline, she reports ongoing issues with migraines and dizziness due to vertigo.She endorses her at home blood pressure has been around 150s/95. Yesterday she was started on atorvastatin and losartan. She quit smoking about 5 years ago. She used to smoke for 32 years. At her peak, she was smoking over 1 PPD. She does not currently continue to smoke. She denies  any palpitations. No headaches, syncope, orthopnea, or PND.  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   Qualifier: Diagnosis of  By: Dianah Field MD, Marcello Moores      Past Surgical History:  Procedure Laterality Date   ABDOMINAL HYSTERECTOMY     BLADDER SUSPENSION  2009   DIAGNOSTIC LAPAROSCOPY     ectopic pregnancy   DILATION AND CURETTAGE OF UTERUS     hx mab   endoscopy nasal sinus surgery  01/13/2020   [facility] - CFS Leak Repair   SACROSPINOUS LIGAMENT FIXATION     posterior repair wtih cysto   SVD     x 2   TUBAL LIGATION  1999   VAGINAL HYSTERECTOMY  03/13/2012   Procedure: HYSTERECTOMY VAGINAL;  Surgeon: Mora Bellman, MD;  Location: [facility];  Service: Gynecology;  Laterality: N/A;    Current Medications: Current Meds  Medication Sig   acetaminophen (TYLENOL 8 HOUR) 650 MG CR tablet Take 2 tablets (1,300 mg total) by mouth every 8 (eight) hours as needed for pain.  albuterol (VENTOLIN HFA) 108 (90 Base) MCG/ACT inhaler Inhale 1 puff into the lungs every 4 (four) hours as needed for wheezing or shortness of breath. (Patient not taking: Reported on 06/09/2022)   aspirin EC 81 MG tablet Take 81 mg by mouth daily. Swallow whole.   aspirin-acetaminophen-caffeine (EXCEDRIN MIGRAINE) 250-250-65 MG per tablet Take 1 tablet by mouth every 6 (six) hours as needed for pain or migraine.   atorvastatin (LIPITOR) 40 MG tablet Take 1 tablet (40 mg total) by mouth daily.   Blood Pressure KIT Check blood pressure 2-3 times week   diclofenac (VOLTAREN) 50 MG EC tablet Take 1 tablet (50 mg total) by mouth 2 (two) times daily. (Patient not taking:  Reported on 06/09/2022)   losartan (COZAAR) 25 MG tablet Take 1 tablet (25 mg total) by mouth at bedtime.   Multiple Vitamin (MULTIVITAMIN ADULT PO) Take 1 tablet by mouth daily.   nitroGLYCERIN (NITROSTAT) 0.4 MG SL tablet Place 1 tablet (0.4 mg total) under the tongue every 5 (five) minutes as needed for chest pain.   polyethylene glycol powder (GLYCOLAX/MIRALAX) 17 GM/SCOOP powder Take 17 g by mouth daily as needed for moderate constipation.   vitamin B-12 (CYANOCOBALAMIN) 500 MCG tablet Take 500 mcg by mouth 3 (three) times a week.   [DISCONTINUED] BLACK CURRANT SEED OIL PO Take by mouth daily. (Patient not taking: Reported on 06/09/2022)   [DISCONTINUED] Omega 3 1000 MG CAPS Take 2 capsules by mouth in the morning and at bedtime. (Patient not taking: Reported on 06/09/2022)   Current Facility-Administered Medications for the 06/09/22 encounter (Office Visit) with Skeet Latch, MD  Medication   sodium chloride flush (NS) 0.9 % injection 3 mL     Allergies:   Latex, Hydrocodone-acetaminophen, and Oxycodone-acetaminophen   Social History   Socioeconomic History   Marital status: Divorced    Spouse name: Not on file   Number of children: Not on file   Years of education: Not on file   Highest education level: 12th grade  Occupational History   Not on file  Tobacco Use   Smoking status: Former    Packs/day: 1.00    Years: 34.00    Total pack years: 34.00    Types: Cigarettes    Quit date: 09/04/2015    Years since quitting: 6.7   Smokeless tobacco: Never   Tobacco comments:    Started age 52 - quit age 83  Vaping Use   Vaping Use: Former  Substance and Sexual Activity   Alcohol use: Yes    Alcohol/week: 2.0 standard drinks of alcohol    Types: 2 Standard drinks or equivalent per week    Comment: socially   Drug use: No   Sexual activity: Yes    Birth control/protection: Surgical    Comment: Hysterectomy  Other Topics Concern   Not on file  Social History Narrative    Lives at home with 2 dogs and 1 cat   Right handed   Social Determinants of Health   Financial Resource Strain: Not on file  Food Insecurity: Not on file  Transportation Needs: No Transportation Needs (10/07/2019)   PRAPARE - Hydrologist (Medical): No    Lack of Transportation (Non-Medical): No  Physical Activity: Not on file  Stress: Not on file  Social Connections: Not on file     Family History: The patient's family history includes Cancer in her father; Colon cancer (age of onset: 37) in her mother; Diabetes in her  maternal grandmother; Heart disease in her maternal grandmother; Other in her mother. There is no history of Rectal cancer or Stomach cancer.  ROS:   Please see the history of present illness.    (+) Shortness of breath (+) Chest pain (+) LE edema (+) Nausea (+) Bilateral breast pain  (+) Headaches (+) Hot flashes (+) Dizziness All other systems reviewed and are negative.  EKGs/Labs/Other Studies Reviewed:    The following studies were reviewed today:  LE Venous Doppler 02/14/2014: Summary:   - No evidence of deep vein or superficial thrombosis    involving the right lower extremity and left common    femoral vein.  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   EKG:   EKG is personally reviewed. 06/09/2022: Sinus rhythm. Rate 72 bpm.  Recent Labs: 09/05/2021: ALT 7 09/19/2021: TSH 1.620 06/06/2022: BUN 14; Creatinine, Ser 0.72; Hemoglobin 14.3; Platelets 242; Potassium 3.9; Sodium 139   Recent Lipid Panel    Component Value Date/Time   CHOL 239 (H) 06/08/2022 1616   TRIG 187 (H) 06/08/2022 1616   HDL 43 06/08/2022 1616   CHOLHDL 5.6 (H) 06/08/2022 1616   CHOLHDL 5.2 08/18/2011 0855   VLDL 19 08/18/2011 0855   LDLCALC 162 (H) 06/08/2022 1616     Risk Assessment/Calculations:           Physical Exam:    Wt Readings from Last 3 Encounters:  06/09/22 218 lb 3.2 oz (99 kg)   06/08/22 216 lb (98 kg)  06/06/22 217 lb 2.5 oz (98.5 kg)     VS:  BP 118/80 (BP Location: Left Arm, Patient Position: Sitting, Cuff Size: Large)   Pulse 72   Ht $R'5\' 3"'lf$  (1.6 m)   Wt 218 lb 3.2 oz (99 kg)   LMP 03/02/2012   BMI 38.65 kg/m  , BMI Body mass index is 38.65 kg/m. GENERAL:  Well appearing HEENT: Pupils equal round and reactive, fundi not visualized, oral mucosa unremarkable NECK:  No jugular venous distention, waveform within normal limits, carotid upstroke brisk and symmetric, no bruits, no thyromegaly LUNGS:  Clear to auscultation bilaterally HEART:  RRR.  PMI not displaced or sustained,S1 and S2 within normal limits, no S3, no S4, no clicks, no rubs, no murmurs ABD:  Flat, positive bowel sounds normal in frequency in pitch, no bruits, no rebound, no guarding, no midline pulsatile mass, no hepatomegaly, no splenomegaly EXT:  2 plus pulses throughout, no edema, no cyanosis no clubbing SKIN:  No rashes no nodules NEURO:  Cranial nerves II through XII grossly intact, motor grossly intact throughout PSYCH:  Cognitively intact, oriented to person place and time   ASSESSMENT:    1. Angina pectoris ([place])   2. Dyslipidemia   3. Breast pain, left    PLAN:    Angina pectoris ([place]) Deanna Scott has symptoms that are consistent with angina.  She has several risk factors including uncontrolled hypertension and lipids.  She just started taking antihypertensives and cholesterol medication yesterday.  She also has a long history of smoking, though she was congratulated on quitting 4 years ago.  Unfortunately she does not have insurance at this time.  We discussed her options including ETT, coronary CTA and cardiac catheterization.  She has high pretest probability and therefore recommend cardiac catheterization.  We will try to get her in touch with our social work team to see if she is eligible for the orange card or if she can  get some help applying for Medicaid.  In the interim, we  will give her nitroglycerin to take as needed.  We will start aspirin 81 mg daily.  Continue atorvastatin.  D-dimer was low in the ED making PE very unlikely.  Shared Decision Making/Informed Consent The risks [stroke (1 in 1000), death (1 in 1000), kidney failure [usually temporary] (1 in 500), bleeding (1 in 200), allergic reaction [possibly serious] (1 in 200)], benefits (diagnostic support and management of coronary artery disease) and alternatives of a cardiac catheterization were discussed in detail with Deanna Scott and she is willing to proceed.       Dyslipidemia She was recently started on atorvastatin appropriately.  Continue current dose and plan to repeat lipids and a CMP in 2 to 3 months.  Breast pain, left Patient recommended to get mammogram.    Shared Decision Making/Informed Consent The risks [stroke (1 in 1000), death (1 in 1000), kidney failure [usually temporary] (1 in 500), bleeding (1 in 200), allergic reaction [possibly serious] (1 in 200)], benefits (diagnostic support and management of coronary artery disease) and alternatives of a cardiac catheterization were discussed in detail with Deanna Scott and she is willing to proceed.   Disposition: FU with Misa Fedorko C. Oval Linsey, MD, [facility] in 1 month.  Medication Adjustments/Labs and Tests Ordered: Current medicines are reviewed at length with the patient today.  Concerns regarding medicines are outlined above.   Orders Placed This Encounter  Procedures   EKG 12-Lead   Meds ordered this encounter  Medications   nitroGLYCERIN (NITROSTAT) 0.4 MG SL tablet    Sig: Place 1 tablet (0.4 mg total) under the tongue every 5 (five) minutes as needed for chest pain.    Dispense:  90 tablet    Refill:  3   sodium chloride flush (NS) 0.9 % injection 3 mL   Patient Instructions  Medication Instructions:  Use your NTG under your tongue for recurrent chest pain. May take one tablet every 5 minutes. If you are still having  discomfort after 3 tablets in 15 minutes, call 911.  START ASPRIN 81 MG DAILY   *If you need a refill on your cardiac medications before your next appointment, please call your pharmacy*  Lab Work: NONE   Testing/Procedures: Your physician has requested that you have a cardiac catheterization. Cardiac catheterization is used to diagnose and/or treat various heart conditions. Doctors may recommend this procedure for a number of different reasons. The most common reason is to evaluate chest pain. Chest pain can be a symptom of coronary artery disease (CAD), and cardiac catheterization can show whether plaque is narrowing or blocking your heart's arteries. This procedure is also used to evaluate the valves, as well as measure the blood flow and oxygen levels in different parts of your heart. For further information please visit HugeFiesta.[email or web address]. Please follow instruction sheet, as given.   Follow-Up: At [facility], you and your health needs are our priority.  As part of our continuing mission to provide you with exceptional heart care, we have created designated Provider Care Teams.  These Care Teams include your primary Cardiologist (physician) and Advanced Practice Providers (APPs -  Physician Assistants and Nurse Practitioners) who all work together to provide you with the care you need, when you need it.  We recommend signing up for the patient portal called "MyChart".  Sign up information is provided on this After Visit Summary.  MyChart is used to connect with patients for Virtual Visits (Telemedicine).  Patients are  able to view lab/test results, encounter notes, upcoming appointments, etc.  Non-urgent messages can be sent to your provider as well.   To learn more about what you can do with MyChart, go to [email or web address].    Your next appointment:   1 month(s)  The format for your next appointment:   In Person  Provider:   Skeet Latch, MD or Laurann Montana,  [facility]   [address] [place] [address] Dept: [phone]  Alegria Dominique  06/09/2022  You are scheduled for a Cardiac Catheterization on Tuesday, August 8 with Dr. Daneen Schick.  1. Please arrive at the Main Entrance A at [facility]: [place], [place] [address] at 11:00 AM (This time is two hours before your procedure to ensure your preparation). Free valet parking service is available.   Special note: Every effort is made to have your procedure done on time. Please understand that emergencies sometimes delay scheduled procedures.  2. Diet: Do not eat solid foods after midnight.  You may have clear liquids until 5 AM upon the day of the procedure.  3. Labs: done 8/1  4. Medication instructions in preparation for your procedure:   Contrast Allergy: No  On the morning of your procedure, take Aspirin and any morning medicines NOT listed above.  You may use sips of water.  5. Plan to go home the same day, you will only stay overnight if medically necessary. 6. You MUST have a responsible adult to drive you home. 7. An adult MUST be with you the first 24 hours after you arrive home. 8. Bring a current list of your medications, and the last time and date medication taken. 9. Bring ID and current insurance cards. 10.Please wear clothes that are easy to get on and off and wear slip-on shoes.  Thank you for allowing [place] to care for you!   -- [facility]     I,Mathew Stumpf,acting as a scribe for Skeet Latch, MD.,have documented all relevant documentation on the behalf of Skeet Latch, MD,as directed by  Skeet Latch, MD while in the presence of Skeet Latch, MD.  I, [place] Oval Linsey, MD have reviewed all documentation for this visit.  The documentation of the exam, diagnosis, procedures, and orders on 06/11/2022 are all  accurate and complete.   Signed, Skeet Latch, MD  06/11/2022 7:30 AM    [place]

## 2022-06-09 NOTE — Assessment & Plan Note (Addendum)
[name] has symptoms that are consistent with angina.  She has several risk factors including uncontrolled hypertension and lipids.  She just started taking antihypertensives and cholesterol medication yesterday.  She also has a long history of smoking, though she was congratulated on quitting 4 years ago.  Unfortunately she does not have insurance at this time.  We discussed her options including ETT, coronary CTA and cardiac catheterization.  She has high pretest probability and therefore recommend cardiac catheterization.  We will try to get her in touch with our social work team to see if she is eligible for the orange card or if she can get some help applying for Medicaid.  In the interim, we will give her nitroglycerin to take as needed.  We will start aspirin 81 mg daily.  Continue atorvastatin.  D-dimer was low in the ED making PE very unlikely.  Shared Decision Making/Informed Consent The risks [stroke (1 in 1000), death (1 in 1000), kidney failure [usually temporary] (1 in 500), bleeding (1 in 200), allergic reaction [possibly serious] (1 in 200)], benefits (diagnostic support and management of coronary artery disease) and alternatives of a cardiac catheterization were discussed in detail with Deanna Scott and she is willing to proceed.

## 2022-06-09 NOTE — Patient Instructions (Addendum)
Medication Instructions:  Use your NTG under your tongue for recurrent chest pain. May take one tablet every 5 minutes. If you are still having discomfort after 3 tablets in 15 minutes, call 911.  START ASPRIN 81 MG DAILY   *If you need a refill on your cardiac medications before your next appointment, please call your pharmacy*  Lab Work: NONE   Testing/Procedures: Your physician has requested that you have a cardiac catheterization. Cardiac catheterization is used to diagnose and/or treat various heart conditions. Doctors may recommend this procedure for a number of different reasons. The most common reason is to evaluate chest pain. Chest pain can be a symptom of coronary artery disease (CAD), and cardiac catheterization can show whether plaque is narrowing or blocking your heart's arteries. This procedure is also used to evaluate the valves, as well as measure the blood flow and oxygen levels in different parts of your heart. For further information please visit HugeFiesta.[email or web address]. Please follow instruction sheet, as given.   Follow-Up: At [facility], you and your health needs are our priority.  As part of our continuing mission to provide you with exceptional heart care, we have created designated Provider Care Teams.  These Care Teams include your primary Cardiologist (physician) and Advanced Practice Providers (APPs -  Physician Assistants and Nurse Practitioners) who all work together to provide you with the care you need, when you need it.  We recommend signing up for the patient portal called "MyChart".  Sign up information is provided on this After Visit Summary.  MyChart is used to connect with patients for Virtual Visits (Telemedicine).  Patients are able to view lab/test results, encounter notes, upcoming appointments, etc.  Non-urgent messages can be sent to your provider as well.   To learn more about what you can do with MyChart, go to [email or web address].    Your next  appointment:   1 month(s)  The format for your next appointment:   In Person  Provider:   Skeet Latch, MD or Laurann Montana, [facility]   [address] [place] [address] Dept: [phone]  Farrah Skoda  06/09/2022  You are scheduled for a Cardiac Catheterization on Tuesday, August 8 with Dr. Daneen Schick.  1. Please arrive at the Main Entrance A at [facility]: [place], [place] [address] at 11:00 AM (This time is two hours before your procedure to ensure your preparation). Free valet parking service is available.   Special note: Every effort is made to have your procedure done on time. Please understand that emergencies sometimes delay scheduled procedures.  2. Diet: Do not eat solid foods after midnight.  You may have clear liquids until 5 AM upon the day of the procedure.  3. Labs: done 8/1  4. Medication instructions in preparation for your procedure:   Contrast Allergy: No  On the morning of your procedure, take Aspirin and any morning medicines NOT listed above.  You may use sips of water.  5. Plan to go home the same day, you will only stay overnight if medically necessary. 6. You MUST have a responsible adult to drive you home. 7. An adult MUST be with you the first 24 hours after you arrive home. 8. Bring a current list of your medications, and the last time and date medication taken. 9. Bring ID and current insurance cards. 10.Please wear clothes that are easy to get on and off and wear slip-on shoes.  Thank you for allowing [place] to care for you!   -- [facility]

## 2022-06-09 NOTE — Assessment & Plan Note (Signed)
Patient recommended to get mammogram.

## 2022-06-09 NOTE — Progress Notes (Signed)
Cardiology Office Note:    Date:  06/11/2022   ID:  Deanna Scott, DOB Jun 29, 1971, MRN [id number]  PCP:  Orvis Brill, DO   [facility] Providers Cardiologist:  Skeet Latch, MD     Referring MD: Orvis Brill, DO   No chief complaint on file.   History of Present Illness:    Deanna Scott is a 51 y.o. female with a hx of GERD and hyperlipidemia here for the evaluation of chest pain per ED referral.  She was seen in the ED 06/06/2022 for intermittent chest pain ongoing for several months but was becoming more persistent. Cardiac enzymes and d-dimer were normal. Lipids were elevated and they recommended outpatient follow up with cardiology.   Today, she is accompanied by her daughter. She states that her chest pain started a couple months ago. She has sharp pain that comes and goes. Her daughter recalls that she has said her pain is "intense but then fades away," generally lasting for about a minute. Her chest pain improves when she ceases activity. 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 On Sunday she went out to walk her dogs and experienced more chest pain. She has decreased her activity since then. She states that at times, she will become nauseous and short of breath due to exertional chest pain. Additionally, she has felt some pain in her left leg earlier today. She reports constant swelling in her legs. In the past, she has also felt some back pain. She also complains of bilateral breast pain, primarily on her left side. At baseline, she reports ongoing issues with migraines and dizziness due to vertigo.She endorses her at home blood pressure has been around 150s/95. Yesterday she was started on atorvastatin and losartan. She quit smoking about 5 years ago. She used to smoke for 32 years. At her peak, she was smoking over 1 PPD. She does not currently continue to smoke. She denies  any palpitations. No headaches, syncope, orthopnea, or PND.  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   Qualifier: Diagnosis of  By: Dianah Field MD, Marcello Moores      Past Surgical History:  Procedure Laterality Date   ABDOMINAL HYSTERECTOMY     BLADDER SUSPENSION  2009   DIAGNOSTIC LAPAROSCOPY     ectopic pregnancy   DILATION AND CURETTAGE OF UTERUS     hx mab   endoscopy nasal sinus surgery  01/13/2020   [facility] - CFS Leak Repair   SACROSPINOUS LIGAMENT FIXATION     posterior repair wtih cysto   SVD     x 2   TUBAL LIGATION  1999   VAGINAL HYSTERECTOMY  03/13/2012   Procedure: HYSTERECTOMY VAGINAL;  Surgeon: Mora Bellman, MD;  Location: [facility];  Service: Gynecology;  Laterality: N/A;    Current Medications: Current Meds  Medication Sig   acetaminophen (TYLENOL 8 HOUR) 650 MG CR tablet Take 2 tablets (1,300 mg total) by mouth every 8 (eight) hours as needed for pain.  albuterol (VENTOLIN HFA) 108 (90 Base) MCG/ACT inhaler Inhale 1 puff into the lungs every 4 (four) hours as needed for wheezing or shortness of breath. (Patient not taking: Reported on 06/09/2022)   aspirin EC 81 MG tablet Take 81 mg by mouth daily. Swallow whole.   aspirin-acetaminophen-caffeine (EXCEDRIN MIGRAINE) 250-250-65 MG per tablet Take 1 tablet by mouth every 6 (six) hours as needed for pain or migraine.   atorvastatin (LIPITOR) 40 MG tablet Take 1 tablet (40 mg total) by mouth daily.   Blood Pressure KIT Check blood pressure 2-3 times week   diclofenac (VOLTAREN) 50 MG EC tablet Take 1 tablet (50 mg total) by mouth 2 (two) times daily. (Patient not taking:  Reported on 06/09/2022)   losartan (COZAAR) 25 MG tablet Take 1 tablet (25 mg total) by mouth at bedtime.   Multiple Vitamin (MULTIVITAMIN ADULT PO) Take 1 tablet by mouth daily.   nitroGLYCERIN (NITROSTAT) 0.4 MG SL tablet Place 1 tablet (0.4 mg total) under the tongue every 5 (five) minutes as needed for chest pain.   polyethylene glycol powder (GLYCOLAX/MIRALAX) 17 GM/SCOOP powder Take 17 g by mouth daily as needed for moderate constipation.   vitamin B-12 (CYANOCOBALAMIN) 500 MCG tablet Take 500 mcg by mouth 3 (three) times a week.   [DISCONTINUED] BLACK CURRANT SEED OIL PO Take by mouth daily. (Patient not taking: Reported on 06/09/2022)   [DISCONTINUED] Omega 3 1000 MG CAPS Take 2 capsules by mouth in the morning and at bedtime. (Patient not taking: Reported on 06/09/2022)   Current Facility-Administered Medications for the 06/09/22 encounter (Office Visit) with Skeet Latch, MD  Medication   sodium chloride flush (NS) 0.9 % injection 3 mL     Allergies:   Latex, Hydrocodone-acetaminophen, and Oxycodone-acetaminophen   Social History   Socioeconomic History   Marital status: Divorced    Spouse name: Not on file   Number of children: Not on file   Years of education: Not on file   Highest education level: 12th grade  Occupational History   Not on file  Tobacco Use   Smoking status: Former    Packs/day: 1.00    Years: 34.00    Total pack years: 34.00    Types: Cigarettes    Quit date: 09/04/2015    Years since quitting: 6.7   Smokeless tobacco: Never   Tobacco comments:    Started age 66 - quit age 27  Vaping Use   Vaping Use: Former  Substance and Sexual Activity   Alcohol use: Yes    Alcohol/week: 2.0 standard drinks of alcohol    Types: 2 Standard drinks or equivalent per week    Comment: socially   Drug use: No   Sexual activity: Yes    Birth control/protection: Surgical    Comment: Hysterectomy  Other Topics Concern   Not on file  Social History Narrative    Lives at home with 2 dogs and 1 cat   Right handed   Social Determinants of Health   Financial Resource Strain: Not on file  Food Insecurity: Not on file  Transportation Needs: No Transportation Needs (10/07/2019)   PRAPARE - Hydrologist (Medical): No    Lack of Transportation (Non-Medical): No  Physical Activity: Not on file  Stress: Not on file  Social Connections: Not on file     Family History: The patient's family history includes Cancer in her father; Colon cancer (age of onset: 32) in her mother; Diabetes in her  maternal grandmother; Heart disease in her maternal grandmother; Other in her mother. There is no history of Rectal cancer or Stomach cancer.  ROS:   Please see the history of present illness.    (+) Shortness of breath (+) Chest pain (+) LE edema (+) Nausea (+) Bilateral breast pain  (+) Headaches (+) Hot flashes (+) Dizziness All other systems reviewed and are negative.  EKGs/Labs/Other Studies Reviewed:    The following studies were reviewed today:  LE Venous Doppler 02/14/2014: Summary:   - No evidence of deep vein or superficial thrombosis    involving the right lower extremity and left common    femoral vein.  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   EKG:   EKG is personally reviewed. 06/09/2022: Sinus rhythm. Rate 72 bpm.  Recent Labs: 09/05/2021: ALT 7 09/19/2021: TSH 1.620 06/06/2022: BUN 14; Creatinine, Ser 0.72; Hemoglobin 14.3; Platelets 242; Potassium 3.9; Sodium 139   Recent Lipid Panel    Component Value Date/Time   CHOL 239 (H) 06/08/2022 1616   TRIG 187 (H) 06/08/2022 1616   HDL 43 06/08/2022 1616   CHOLHDL 5.6 (H) 06/08/2022 1616   CHOLHDL 5.2 08/18/2011 0855   VLDL 19 08/18/2011 0855   LDLCALC 162 (H) 06/08/2022 1616     Risk Assessment/Calculations:           Physical Exam:    Wt Readings from Last 3 Encounters:  06/09/22 218 lb 3.2 oz (99 kg)   06/08/22 216 lb (98 kg)  06/06/22 217 lb 2.5 oz (98.5 kg)     VS:  BP 118/80 (BP Location: Left Arm, Patient Position: Sitting, Cuff Size: Large)   Pulse 72   Ht $R'5\' 3"'Hg$  (1.6 m)   Wt 218 lb 3.2 oz (99 kg)   LMP 03/02/2012   BMI 38.65 kg/m  , BMI Body mass index is 38.65 kg/m. GENERAL:  Well appearing HEENT: Pupils equal round and reactive, fundi not visualized, oral mucosa unremarkable NECK:  No jugular venous distention, waveform within normal limits, carotid upstroke brisk and symmetric, no bruits, no thyromegaly LUNGS:  Clear to auscultation bilaterally HEART:  RRR.  PMI not displaced or sustained,S1 and S2 within normal limits, no S3, no S4, no clicks, no rubs, no murmurs ABD:  Flat, positive bowel sounds normal in frequency in pitch, no bruits, no rebound, no guarding, no midline pulsatile mass, no hepatomegaly, no splenomegaly EXT:  2 plus pulses throughout, no edema, no cyanosis no clubbing SKIN:  No rashes no nodules NEURO:  Cranial nerves II through XII grossly intact, motor grossly intact throughout PSYCH:  Cognitively intact, oriented to person place and time   ASSESSMENT:    1. Angina pectoris ([place])   2. Dyslipidemia   3. Breast pain, left    PLAN:    Angina pectoris ([place]) Deanna Scott has symptoms that are consistent with angina.  She has several risk factors including uncontrolled hypertension and lipids.  She just started taking antihypertensives and cholesterol medication yesterday.  She also has a long history of smoking, though she was congratulated on quitting 4 years ago.  Unfortunately she does not have insurance at this time.  We discussed her options including ETT, coronary CTA and cardiac catheterization.  She has high pretest probability and therefore recommend cardiac catheterization.  We will try to get her in touch with our social work team to see if she is eligible for the orange card or if she can  get some help applying for Medicaid.  In the interim, we  will give her nitroglycerin to take as needed.  We will start aspirin 81 mg daily.  Continue atorvastatin.  D-dimer was low in the ED making PE very unlikely.  Shared Decision Making/Informed Consent The risks [stroke (1 in 1000), death (1 in 1000), kidney failure [usually temporary] (1 in 500), bleeding (1 in 200), allergic reaction [possibly serious] (1 in 200)], benefits (diagnostic support and management of coronary artery disease) and alternatives of a cardiac catheterization were discussed in detail with Deanna Scott and she is willing to proceed.       Dyslipidemia She was recently started on atorvastatin appropriately.  Continue current dose and plan to repeat lipids and a CMP in 2 to 3 months.  Breast pain, left Patient recommended to get mammogram.    Shared Decision Making/Informed Consent The risks [stroke (1 in 1000), death (1 in 1000), kidney failure [usually temporary] (1 in 500), bleeding (1 in 200), allergic reaction [possibly serious] (1 in 200)], benefits (diagnostic support and management of coronary artery disease) and alternatives of a cardiac catheterization were discussed in detail with Deanna Scott and she is willing to proceed.   Disposition: FU with Tre Sanker C. Oval Linsey, MD, [facility] in 1 month.  Medication Adjustments/Labs and Tests Ordered: Current medicines are reviewed at length with the patient today.  Concerns regarding medicines are outlined above.   Orders Placed This Encounter  Procedures   EKG 12-Lead   Meds ordered this encounter  Medications   nitroGLYCERIN (NITROSTAT) 0.4 MG SL tablet    Sig: Place 1 tablet (0.4 mg total) under the tongue every 5 (five) minutes as needed for chest pain.    Dispense:  90 tablet    Refill:  3   sodium chloride flush (NS) 0.9 % injection 3 mL   Patient Instructions  Medication Instructions:  Use your NTG under your tongue for recurrent chest pain. May take one tablet every 5 minutes. If you are still having  discomfort after 3 tablets in 15 minutes, call 911.  START ASPRIN 81 MG DAILY   *If you need a refill on your cardiac medications before your next appointment, please call your pharmacy*  Lab Work: NONE   Testing/Procedures: Your physician has requested that you have a cardiac catheterization. Cardiac catheterization is used to diagnose and/or treat various heart conditions. Doctors may recommend this procedure for a number of different reasons. The most common reason is to evaluate chest pain. Chest pain can be a symptom of coronary artery disease (CAD), and cardiac catheterization can show whether plaque is narrowing or blocking your heart's arteries. This procedure is also used to evaluate the valves, as well as measure the blood flow and oxygen levels in different parts of your heart. For further information please visit [email or web address]. Please follow instruction sheet, as given.   Follow-Up: At [facility], you and your health needs are our priority.  As part of our continuing mission to provide you with exceptional heart care, we have created designated Provider Care Teams.  These Care Teams include your primary Cardiologist (physician) and Advanced Practice Providers (APPs -  Physician Assistants and Nurse Practitioners) who all work together to provide you with the care you need, when you need it.  We recommend signing up for the patient portal called "MyChart".  Sign up information is provided on this After Visit Summary.  MyChart is used to connect with patients for Virtual Visits (Telemedicine).  Patients are  able to view lab/test results, encounter notes, upcoming appointments, etc.  Non-urgent messages can be sent to your provider as well.   To learn more about what you can do with MyChart, go to [email or web address].    Your next appointment:   1 month(s)  The format for your next appointment:   In Person  Provider:   Skeet Latch, MD or Laurann Montana,  [facility]   [address] [place] [address] Dept: [phone]  Deanna Scott  06/09/2022  You are scheduled for a Cardiac Catheterization on Tuesday, August 8 with Dr. Daneen Schick.  1. Please arrive at the Main Entrance A at [facility]: [place], [place] [address] at 11:00 AM (This time is two hours before your procedure to ensure your preparation). Free valet parking service is available.   Special note: Every effort is made to have your procedure done on time. Please understand that emergencies sometimes delay scheduled procedures.  2. Diet: Do not eat solid foods after midnight.  You may have clear liquids until 5 AM upon the day of the procedure.  3. Labs: done 8/1  4. Medication instructions in preparation for your procedure:   Contrast Allergy: No  On the morning of your procedure, take Aspirin and any morning medicines NOT listed above.  You may use sips of water.  5. Plan to go home the same day, you will only stay overnight if medically necessary. 6. You MUST have a responsible adult to drive you home. 7. An adult MUST be with you the first 24 hours after you arrive home. 8. Bring a current list of your medications, and the last time and date medication taken. 9. Bring ID and current insurance cards. 10.Please wear clothes that are easy to get on and off and wear slip-on shoes.  Thank you for allowing [place] to care for you!   -- [facility]     I,Mathew Stumpf,acting as a scribe for Skeet Latch, MD.,have documented all relevant documentation on the behalf of Skeet Latch, MD,as directed by  Skeet Latch, MD while in the presence of Skeet Latch, MD.  I, [place] Oval Linsey, MD have reviewed all documentation for this visit.  The documentation of the exam, diagnosis, procedures, and orders on 06/11/2022 are all  accurate and complete.   Signed, Skeet Latch, MD  06/11/2022 7:30 AM    Deanna Scott

## 2022-06-09 NOTE — Assessment & Plan Note (Signed)
She was recently started on atorvastatin appropriately.  Continue current dose and plan to repeat lipids and a CMP in 2 to 3 months.

## 2022-06-11 ENCOUNTER — Encounter (HOSPITAL_BASED_OUTPATIENT_CLINIC_OR_DEPARTMENT_OTHER): Payer: Self-pay | Admitting: Cardiovascular Disease

## 2022-06-11 MED ORDER — SODIUM CHLORIDE 0.9% FLUSH: 3.0000 mL | Freq: Two times a day (BID) | INTRAVENOUS | Status: DC

## 2022-06-12 NOTE — H&P (Signed)
Young female with RF (Htn, HLD, Tobacco dc 4 years ago. 3 mth h/o exertional and rest pain. Cath to define anatomy and guide therapy.

## 2022-06-13 ENCOUNTER — Encounter: Payer: Self-pay | Admitting: Student

## 2022-06-13 ENCOUNTER — Telehealth: Payer: Self-pay | Admitting: Student

## 2022-06-13 ENCOUNTER — Ambulatory Visit (HOSPITAL_COMMUNITY)
Admission: RE | Admit: 2022-06-13 | Discharge: 2022-06-13 | Disposition: A | Discharge status: Home / Self Care | Payer: No Typology Code available for payment source | Attending: Interventional Cardiology | Admitting: Interventional Cardiology

## 2022-06-13 ENCOUNTER — Other Ambulatory Visit: Payer: Self-pay

## 2022-06-13 ENCOUNTER — Encounter (HOSPITAL_COMMUNITY)
Admission: RE | Disposition: A | Discharge status: Home / Self Care | Payer: Self-pay | Source: Home / Self Care | Attending: Interventional Cardiology

## 2022-06-13 DIAGNOSIS — N644 Mastodynia: Secondary | ICD-10-CM | POA: Insufficient documentation

## 2022-06-13 DIAGNOSIS — Z87891 Personal history of nicotine dependence: Secondary | ICD-10-CM | POA: Insufficient documentation

## 2022-06-13 DIAGNOSIS — I10 Essential (primary) hypertension: Secondary | ICD-10-CM | POA: Diagnosis present

## 2022-06-13 DIAGNOSIS — I209 Angina pectoris, unspecified: Secondary | ICD-10-CM | POA: Insufficient documentation

## 2022-06-13 DIAGNOSIS — K219 Gastro-esophageal reflux disease without esophagitis: Secondary | ICD-10-CM | POA: Insufficient documentation

## 2022-06-13 DIAGNOSIS — R0683 Snoring: Secondary | ICD-10-CM | POA: Diagnosis present

## 2022-06-13 DIAGNOSIS — E785 Hyperlipidemia, unspecified: Secondary | ICD-10-CM | POA: Diagnosis present

## 2022-06-13 DIAGNOSIS — R7303 Prediabetes: Secondary | ICD-10-CM | POA: Diagnosis present

## 2022-06-13 HISTORY — PX: INTRAVASCULAR PRESSURE WIRE/FFR STUDY: CATH118243

## 2022-06-13 HISTORY — PX: LEFT HEART CATH AND CORONARY ANGIOGRAPHY: CATH118249

## 2022-06-13 LAB — POCT ACTIVATED CLOTTING TIME: Activated Clotting Time: 335 seconds

## 2022-06-13 SURGERY — LEFT HEART CATH AND CORONARY ANGIOGRAPHY
Anesthesia: LOCAL

## 2022-06-13 MED ORDER — SODIUM CHLORIDE 0.9 % WEIGHT BASED INFUSION
3.0000 mL/kg/h | INTRAVENOUS | Status: AC
  Administered 2022-06-13: 3 mL/kg/h via INTRAVENOUS

## 2022-06-13 MED ORDER — ONDANSETRON HCL 4 MG/2ML IJ SOLN: 4.0000 mg | Freq: Four times a day (QID) | INTRAMUSCULAR | Status: DC | PRN

## 2022-06-13 MED ORDER — ASPIRIN 81 MG PO CHEW: 81.0000 mg | CHEWABLE_TABLET | ORAL | Status: DC

## 2022-06-13 MED ORDER — LABETALOL HCL 5 MG/ML IV SOLN: 10.0000 mg | INTRAVENOUS | Status: DC | PRN

## 2022-06-13 MED ORDER — HEPARIN SODIUM (PORCINE) 1000 UNIT/ML IJ SOLN
INTRAMUSCULAR | Status: AC
Start: 2022-06-13 — End: ?
  Filled 2022-06-13: qty 10

## 2022-06-13 MED ORDER — SODIUM CHLORIDE 0.9 % IV SOLN: INTRAVENOUS | Status: DC

## 2022-06-13 MED ORDER — ADENOSINE (DIAGNOSTIC) 140MCG/KG/MIN
INTRAVENOUS | Status: DC | PRN
  Administered 2022-06-13: 140 ug/kg/min via INTRAVENOUS

## 2022-06-13 MED ORDER — ADENOSINE 12 MG/4ML IV SOLN
INTRAVENOUS | Status: AC
  Filled 2022-06-13: qty 16

## 2022-06-13 MED ORDER — VERAPAMIL HCL 2.5 MG/ML IV SOLN
INTRAVENOUS | Status: AC
Start: 2022-06-13 — End: ?
  Filled 2022-06-13: qty 2

## 2022-06-13 MED ORDER — LIDOCAINE HCL (PF) 1 % IJ SOLN
INTRAMUSCULAR | Status: AC
  Filled 2022-06-13: qty 30

## 2022-06-13 MED ORDER — ACETAMINOPHEN 325 MG PO TABS: 650.0000 mg | ORAL_TABLET | ORAL | Status: DC | PRN

## 2022-06-13 MED ORDER — IOHEXOL 350 MG/ML SOLN
INTRAVENOUS | Status: DC | PRN
  Administered 2022-06-13: 20 mL

## 2022-06-13 MED ORDER — FENTANYL CITRATE (PF) 100 MCG/2ML IJ SOLN
INTRAMUSCULAR | Status: AC
  Filled 2022-06-13: qty 2

## 2022-06-13 MED ORDER — HEPARIN SODIUM (PORCINE) 1000 UNIT/ML IJ SOLN
INTRAMUSCULAR | Status: DC | PRN
  Administered 2022-06-13: 4000 [IU] via INTRAVENOUS
  Administered 2022-06-13: 5000 [IU] via INTRAVENOUS

## 2022-06-13 MED ORDER — HEPARIN (PORCINE) IN NACL 1000-0.9 UT/500ML-% IV SOLN
INTRAVENOUS | Status: AC
Start: 2022-06-13 — End: ?
  Filled 2022-06-13: qty 1000

## 2022-06-13 MED ORDER — HEPARIN (PORCINE) IN NACL 1000-0.9 UT/500ML-% IV SOLN
INTRAVENOUS | Status: DC | PRN
  Administered 2022-06-13 (×2): 500 mL

## 2022-06-13 MED ORDER — SODIUM CHLORIDE 0.9 % IV SOLN: 250.0000 mL | INTRAVENOUS | Status: DC | PRN

## 2022-06-13 MED ORDER — LIDOCAINE HCL (PF) 1 % IJ SOLN
INTRAMUSCULAR | Status: DC | PRN
  Administered 2022-06-13: 2 mL

## 2022-06-13 MED ORDER — SODIUM CHLORIDE 0.9% FLUSH: 3.0000 mL | Freq: Two times a day (BID) | INTRAVENOUS | Status: DC

## 2022-06-13 MED ORDER — MIDAZOLAM HCL 2 MG/2ML IJ SOLN
INTRAMUSCULAR | Status: DC | PRN
  Administered 2022-06-13 (×2): 1 mg via INTRAVENOUS

## 2022-06-13 MED ORDER — VERAPAMIL HCL 2.5 MG/ML IV SOLN
INTRAVENOUS | Status: DC | PRN
  Administered 2022-06-13: 10 mL via INTRA_ARTERIAL

## 2022-06-13 MED ORDER — SODIUM CHLORIDE 0.9 % WEIGHT BASED INFUSION: 1.0000 mL/kg/h | INTRAVENOUS | Status: DC

## 2022-06-13 MED ORDER — FENTANYL CITRATE (PF) 100 MCG/2ML IJ SOLN
INTRAMUSCULAR | Status: DC | PRN
  Administered 2022-06-13: 50 ug via INTRAVENOUS
  Administered 2022-06-13: 25 ug via INTRAVENOUS

## 2022-06-13 MED ORDER — HYDRALAZINE HCL 20 MG/ML IJ SOLN: 10.0000 mg | INTRAMUSCULAR | Status: DC | PRN

## 2022-06-13 MED ORDER — ASPIRIN 81 MG PO CHEW: 81.0000 mg | CHEWABLE_TABLET | Freq: Every day | ORAL | Status: DC

## 2022-06-13 MED ORDER — SODIUM CHLORIDE 0.9% FLUSH: 3.0000 mL | INTRAVENOUS | Status: DC | PRN

## 2022-06-13 MED ORDER — MIDAZOLAM HCL 2 MG/2ML IJ SOLN
INTRAMUSCULAR | Status: AC
  Filled 2022-06-13: qty 2

## 2022-06-13 SURGICAL SUPPLY — 13 items
BAND CMPR LRG ZPHR (HEMOSTASIS) ×1
BAND ZEPHYR COMPRESS 30 LONG (HEMOSTASIS) ×1 IMPLANT
CATH 5FR JL3.5 JR4 ANG PIG MP (CATHETERS) ×1 IMPLANT
CATH VISTA GUIDE 6FR XB3 (CATHETERS) ×1 IMPLANT
DEVICE RAD COMP TR BAND LRG (VASCULAR PRODUCTS) ×1 IMPLANT
GLIDESHEATH SLEND A-KIT 6F 22G (SHEATH) ×1 IMPLANT
GUIDEWIRE INQWIRE 1.5J.035X260 (WIRE) IMPLANT
GUIDEWIRE PRESSURE X 175 (WIRE) ×1 IMPLANT
INQWIRE 1.5J .035X260CM (WIRE) ×2
KIT HEART LEFT (KITS) ×2 IMPLANT
PACK CARDIAC CATHETERIZATION (CUSTOM PROCEDURE TRAY) ×2 IMPLANT
TRANSDUCER W/STOPCOCK (MISCELLANEOUS) ×2 IMPLANT
TUBING CIL FLEX 10 FLL-RA (TUBING) ×2 IMPLANT

## 2022-06-13 NOTE — Interval H&P Note (Signed)
Cath Lab Visit (complete for each Cath Lab visit)  Clinical Evaluation Leading to the Procedure:   ACS: Yes.    Non-ACS:    Anginal Classification: CCS III  Anti-ischemic medical therapy: Minimal Therapy (1 class of medications)  Non-Invasive Test Results: No non-invasive testing performed  Prior CABG: No previous CABG      History and Physical Interval Note:  06/13/2022 12:33 PM  Deanna Scott  has presented today for surgery, with the diagnosis of angina.  The various methods of treatment have been discussed with the patient and family. After consideration of risks, benefits and other options for treatment, the patient has consented to  Procedure(s): LEFT HEART CATH AND CORONARY ANGIOGRAPHY (N/A) as a surgical intervention.  The patient's history has been reviewed, patient examined, no change in status, stable for surgery.  I have reviewed the patient's chart and labs.  Questions were answered to the patient's satisfaction.     Belva Crome III

## 2022-06-13 NOTE — CV Procedure (Signed)
Normal coronary arteries. Right dominant anatomy. Normal LV function and EDP. No evidence of microvascular dysfunction: Normal CFR 4.0 and IMR 15. Vasoreactivity was not tested.  Therefore vasoconstriction and spasm as etiology of chest pain cannot be excluded.

## 2022-06-13 NOTE — Telephone Encounter (Signed)
Unable to reach patient regarding results. Left HIPAA-compliant VM. Will send letter with results.

## 2022-06-14 ENCOUNTER — Encounter (HOSPITAL_COMMUNITY): Payer: Self-pay | Admitting: Interventional Cardiology

## 2022-06-14 ENCOUNTER — Telehealth: Payer: Self-pay | Admitting: Licensed Clinical Social Worker

## 2022-06-15 NOTE — Progress Notes (Signed)
Heart and Vascular Care Navigation  06/14/2022  Kadian Barcellos Dec 10, 1970 [id number]  Reason for Referral:  Patient is participating in a Managed Medicaid Plan: No, self pay only  Engaged with patient by telephone for initial visit for Heart and Vascular Care Coordination.                                                                                                   Assessment:             LCSW able to reach pt today after her catheterization on 8/8. Introduced self, role, reason for call. Pt confirmed home address, she lives with her 57 yo daughter. She is currently self employed, she does not receive any SNAP benefits at this time as she did not complete her renewal months ago. She has transportation and drives herself to appointments. Pt did not mention any additional cost of living concerns at this time, she has been screened in eligible for Medicaid and Disability- may be eligible for [employer] , [employer], as well as [employer]. Pt agreeable tor receiving these applications and working with our team to complete and submit. LCSW also discussed pt re-enrolling in SNAP to help with costs of living, she is agreeable to that as well. Encouraged her to f/u with me regarding any additional questions or concerns.                             HRT/VAS Care Coordination     Patients Home Cardiology Office --  [place]   Outpatient Care Team Social Worker   Social Worker Name: Valeda Malm, [facility] [phone]   Living arrangements for the past 2 months Apartment   Lives with: Adult Children   Patient Current Insurance Coverage Self-Pay   Patient Has Concern With [place] Yes   Patient Concerns With Medical Bills no insurance, previously denied for assistance due to not completing application appropriately   Medical Bill Referrals: CAFA/Orange Card   Does Patient Have Prescription Coverage? No   Patient Prescription Assistance  Programs [facility]   [place] [facility] Medications mailed application   Home Assistive Devices/Equipment None       Social History:                                                                             SDOH Screenings   Alcohol Screen: Not on file  Depression (PHQ2-9): Low Risk  (06/08/2022)   Depression (PHQ2-9)    PHQ-2 Score: 0  Financial Resource Strain: Medium Risk (06/14/2022)   Overall Financial Resource Strain (CARDIA)    Difficulty of Paying Living Expenses: Somewhat hard  Food Insecurity: Food Insecurity Present (06/14/2022)   Hunger Vital Sign    Worried About Running  Out of Food in the Last Year: Sometimes true    Ran Out of Food in the Last Year: Sometimes true  Housing: Low Risk  (06/14/2022)   Housing    Last Housing Risk Score: 0  Physical Activity: Not on file  Social Connections: Not on file  Stress: Not on file  Tobacco Use: Medium Risk (06/14/2022)   Patient History    Smoking Tobacco Use: Former    Smokeless Tobacco Use: Never    Passive Exposure: Not on file  Transportation Needs: No Transportation Needs (06/14/2022)   PRAPARE - Hydrologist (Medical): No    Lack of Transportation (Non-Medical): No    SDOH Interventions: Financial Resources:  Financial Strain Interventions: Other (Comment) (CAFA; [employer]; [employer] FNS card from [employer]) Occupational hygienist for [employer] Insecurity:  Food Insecurity Interventions: Assist with [employer], Other (Comment) (FNS Card from [employer])  Housing Insecurity:  Housing Interventions: Intervention Not Indicated  Transportation:   Transportation Interventions: Intervention Not Indicated    Other Care Navigation Interventions:     Provided Pharmacy assistance resources [facility]   Follow-up plan:   LCSW has mailed the following to pt: my card, FNS card for [employer], CAFA, [employer] and [employer].

## 2022-06-16 NOTE — Telephone Encounter (Signed)
Patient returns call to nurse line. Advised of results per letter from Dr. Madison Hickman.   Answered all questions.   Deanna Grumbling, RN

## 2022-06-21 ENCOUNTER — Telehealth: Payer: Self-pay | Admitting: Cardiovascular Disease

## 2022-06-21 NOTE — Telephone Encounter (Signed)
Returned call to patient's daughter, she endorses that the incision has a bump near the site RN advised that this is okay as long as it is getting better each day. Patient reports a little bruising but it is also getting better each day. Patient asked if she can go swimming tomorrow, advised to dry incision well and okay as long as it is healed over.

## 2022-06-21 NOTE — Telephone Encounter (Signed)
Pt's is requesting a call back in regards to incision site from the procedure she had on 08/08.

## 2022-06-23 ENCOUNTER — Telehealth: Payer: Self-pay | Admitting: Licensed Clinical Social Worker

## 2022-06-23 NOTE — Telephone Encounter (Signed)
H&V Care Navigation CSW Progress Note  Clinical Social Worker contacted patient by phone to f/u on assistance applications mailed to her. No answer at [phone]. Left voicemail requesting call back to confirm received/for me to answer any additional questions or concerns. Will re-attempt as able.   Patient is participating in a Managed Medicaid Plan:  No, self pay only.   SDOH Screenings   Alcohol Screen: Not on file  Depression (PHQ2-9): Low Risk  (06/08/2022)   Depression (PHQ2-9)    PHQ-2 Score: 0  Financial Resource Strain: Medium Risk (06/14/2022)   Overall Financial Resource Strain (CARDIA)    Difficulty of Paying Living Expenses: Somewhat hard  Food Insecurity: Food Insecurity Present (06/14/2022)   Hunger Vital Sign    Worried About Running Out of Food in the Last Year: Sometimes true    Ran Out of Food in the Last Year: Sometimes true  Housing: Low Risk  (06/14/2022)   Housing    Last Housing Risk Score: 0  Physical Activity: Not on file  Social Connections: Not on file  Stress: Not on file  Tobacco Use: Medium Risk (06/14/2022)   Patient History    Smoking Tobacco Use: Former    Smokeless Tobacco Use: Never    Passive Exposure: Not on file  Transportation Needs: No Transportation Needs (06/14/2022)   PRAPARE - Transportation    Lack of Transportation (Medical): No    Lack of Transportation (Non-Medical): No   Westley Hummer, MSW, [place]  [phone]- work cell phone (preferred) [phone]- desk phone

## 2022-06-26 ENCOUNTER — Ambulatory Visit (INDEPENDENT_AMBULATORY_CARE_PROVIDER_SITE_OTHER): Payer: Self-pay | Admitting: Family Medicine

## 2022-06-26 VITALS — BP 113/76 | HR 65 | Ht 63.0 in | Wt 214.4 lb

## 2022-06-26 DIAGNOSIS — E785 Hyperlipidemia, unspecified: Secondary | ICD-10-CM

## 2022-06-26 DIAGNOSIS — I10 Essential (primary) hypertension: Secondary | ICD-10-CM

## 2022-06-26 NOTE — Progress Notes (Signed)
    SUBJECTIVE:   CHIEF COMPLAINT / HPI:   Patient presents for hospital follow up, she as admitted for chest pain given her risk of CAD. Noted to have angina and left heart cath was performed. Cardiologist told her to follow up with PCP for her blood pressure. She brings in a BP log, systolic range 254-982 and diastolic 64-15. She has cut down on cheese and fried foods. Says that she does not want to be on medications all her life. She feels a lot better. Denies chest pain, dyspnea and leg swelling.   OBJECTIVE:   BP 113/76   Pulse 65   Ht '5\' 3"'$  (1.6 m)   Wt 214 lb 6.4 oz (97.3 kg)   LMP 03/02/2012   SpO2 99%   BMI 37.98 kg/m   General: Patient well-appearing, in no acute distress. CV: RRR, no murmurs or gallops auscultated Resp: CTAB, no wheezing, rales or rhonchi noted Ext: no LE edema noted bilaterally  ASSESSMENT/PLAN:   HTN (hypertension) -BP 113/76, at goal -continue losartan 25 mg daily -diet and exercise counseling provided  -BP guidelines discussed and instructed to record/bring BP log to next visit -BMP pending to ensure electrolytes wnl after starting losartan -instructed to maintain appropriate cardiology follow up, next visit is on 9/8 -follow up with PCP In 2 months   Dyslipidemia -lipid panel notable for elevated cholesterol, triglycerides and LDL -discussed the importance of statin therapy and how her medications will greatly benefit her health in lowering her risk of a cardiac event in the future -continue statin therapy and aspirin -diet and exercise counseling    -Med rec reviewed and updated appropriately.   Donney Dice, [place]

## 2022-06-26 NOTE — Patient Instructions (Signed)
It was great seeing you today!  Today we discussed your blood pressure and recent hospital visit. Your blood pressure looks great, please continue to take losartan daily along with all your other medications. These will all help lower your risk of a heart attack in the future.   Please also try to exercise at least 90 minutes a week or stay active throughout the day. Also try to eat plenty of vegetables and healthy foods, continue to limit fried foods and salt.   Please follow up at your next scheduled appointment in 2 months, if anything arises between now and then, please don't hesitate to contact our office.   Thank you for allowing [place] to be a part of your medical care!  Thank you, Dr. Larae Grooms

## 2022-06-26 NOTE — Assessment & Plan Note (Signed)
-  lipid panel notable for elevated cholesterol, triglycerides and LDL -discussed the importance of statin therapy and how her medications will greatly benefit her health in lowering her risk of a cardiac event in the future -continue statin therapy and aspirin -diet and exercise counseling

## 2022-06-26 NOTE — Assessment & Plan Note (Addendum)
-  BP 113/76, at goal -continue losartan 25 mg daily -diet and exercise counseling provided  -BP guidelines discussed and instructed to record/bring BP log to next visit -BMP pending to ensure electrolytes wnl after starting losartan -instructed to maintain appropriate cardiology follow up, next visit is on 9/8 -follow up with PCP In 2 months

## 2022-06-27 ENCOUNTER — Telehealth: Payer: Self-pay | Admitting: Licensed Clinical Social Worker

## 2022-06-27 ENCOUNTER — Encounter: Payer: Self-pay | Admitting: Family Medicine

## 2022-06-27 LAB — BASIC METABOLIC PANEL
BUN/Creatinine Ratio: 23 (ref 9–23)
BUN: 18 mg/dL (ref 6–24)
CO2: 22 mmol/L (ref 20–29)
Calcium: 9.2 mg/dL (ref 8.7–10.2)
Chloride: 105 mmol/L (ref 96–106)
Creatinine, Ser: 0.8 mg/dL (ref 0.57–1.00)
Glucose: 154 mg/dL — ABNORMAL HIGH (ref 70–99)
Potassium: 3.7 mmol/L (ref 3.5–5.2)
Sodium: 142 mmol/L (ref 134–144)
eGFR: 88 mL/min/{1.73_m2} (ref >59)

## 2022-06-27 NOTE — Telephone Encounter (Signed)
H&V Care Navigation CSW Progress Note  Clinical Social Worker contacted patient by phone to f/u on assistance applications and resources sent. I was able to reach pt today at [phone]. Re-introduced self, role, reason for call. Pt confirms paperwork received, she is waiting on further assistance at this time to complete applications with her daughter. She was encouraged to reach out to me if any additional questions/concerns and before submitting to ensure it goes to appropriate location. Pt aware, will call me as needed.   Patient is participating in a Managed Medicaid Plan:  No, self pay only.  SDOH Screenings   Alcohol Screen: Not on file  Depression (PHQ2-9): Low Risk  (06/26/2022)   Depression (PHQ2-9)    PHQ-2 Score: 0  Financial Resource Strain: Medium Risk (06/14/2022)   Overall Financial Resource Strain (CARDIA)    Difficulty of Paying Living Expenses: Somewhat hard  Food Insecurity: Food Insecurity Present (06/14/2022)   Hunger Vital Sign    Worried About Running Out of Food in the Last Year: Sometimes true    Ran Out of Food in the Last Year: Sometimes true  Housing: Low Risk  (06/14/2022)   Housing    Last Housing Risk Score: 0  Physical Activity: Not on file  Social Connections: Not on file  Stress: Not on file  Tobacco Use: Medium Risk (06/26/2022)   Patient History    Smoking Tobacco Use: Former    Smokeless Tobacco Use: Never    Passive Exposure: Not on file  Transportation Needs: No Transportation Needs (06/14/2022)   PRAPARE - Transportation    Lack of Transportation (Medical): No    Lack of Transportation (Non-Medical): No    Westley Hummer, MSW, [place]  [phone]- work cell phone (preferred) [phone]- desk phone

## 2022-07-10 ENCOUNTER — Ambulatory Visit (HOSPITAL_COMMUNITY)
Admission: EM | Admit: 2022-07-10 | Discharge: 2022-07-10 | Disposition: A | Discharge status: Home / Self Care | Payer: No Typology Code available for payment source | Attending: Physician Assistant | Admitting: Physician Assistant

## 2022-07-10 ENCOUNTER — Ambulatory Visit (INDEPENDENT_AMBULATORY_CARE_PROVIDER_SITE_OTHER): Payer: No Typology Code available for payment source

## 2022-07-10 ENCOUNTER — Encounter (HOSPITAL_COMMUNITY): Payer: Self-pay

## 2022-07-10 DIAGNOSIS — S92912A Unspecified fracture of left toe(s), initial encounter for closed fracture: Secondary | ICD-10-CM

## 2022-07-10 NOTE — Discharge Instructions (Addendum)
Wear hard soled shoe Can take ibuprofen as needed Apply ice Call Orthopedics tomorrow to make an appointment

## 2022-07-10 NOTE — ED Triage Notes (Signed)
Pt injured toes 10 days ago causing pain and discomfort and swelling on left foot.

## 2022-07-10 NOTE — ED Provider Notes (Signed)
[place]    CSN: [id number] Arrival date & time: 07/10/22  1412      History   Chief Complaint Chief Complaint  Patient presents with   Toe Injury    HPI Deanna Scott is a 51 y.o. female.   Pt complains of left toe pain that started after she stubbed her toe on the end of the bed 10 days ago.  She reports continued bruising and swelling since injury.  She has been ambulating with pain.  She has taken nothing for the pain.  No other injuries.     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   Qualifier: Diagnosis of  By: Dianah Field MD, Marcello Moores      Patient Active Problem List   Diagnosis Date Noted   Breast pain, left 06/09/2022   Angina pectoris ([place]) 06/08/2022   Environmental allergies 06/08/2022   Prediabetes 10/05/2021   Dyslipidemia 10/05/2021   HTN (hypertension) 09/19/2021   Hyperlipidemia 09/06/2021   Headache 02/01/2021   BV (bacterial vaginosis) 11/11/2020   Snoring 06/08/2020   Pinched nerve 06/07/2020   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   Extremity numbness 10/24/2019   Shortness of breath 10/23/2019   Lateral epicondylitis 10/08/2019   Fatigue 10/08/2019   Screening breast examination 10/07/2019   Triceps tendonitis 07/21/2019   Low back pain 06/18/2019   Chronic sinusitis 10/18/2018   Benign paroxysmal positional vertigo 10/18/2018   Vaginal wall prolapse 07/29/2017   Migraines 07/27/2017   GERD (gastroesophageal reflux disease) 07/29/2014   OBESITY, NOS 01/03/2007    Past Surgical History:  Procedure Laterality Date   ABDOMINAL HYSTERECTOMY     BLADDER SUSPENSION  2009   DIAGNOSTIC  LAPAROSCOPY     ectopic pregnancy   DILATION AND CURETTAGE OF UTERUS     hx mab   endoscopy nasal sinus surgery  01/13/2020   [facility] - CFS Leak Repair   INTRAVASCULAR PRESSURE WIRE/FFR STUDY N/A 06/13/2022   Procedure: INTRAVASCULAR PRESSURE WIRE/FFR STUDY;  Surgeon: Belva Crome, MD;  Location: [facility];  Service: Cardiovascular;  Laterality: N/A;   LEFT HEART CATH AND CORONARY ANGIOGRAPHY N/A 06/13/2022   Procedure: LEFT HEART CATH AND CORONARY ANGIOGRAPHY;  Surgeon: Belva Crome, MD;  Location: [facility];  Service: Cardiovascular;  Laterality: N/A;   SACROSPINOUS LIGAMENT FIXATION     posterior repair wtih cysto   SVD     x 2   TUBAL LIGATION  1999   VAGINAL HYSTERECTOMY  03/13/2012   Procedure: HYSTERECTOMY VAGINAL;  Surgeon: Mora Bellman, MD;  Location: [facility];  Service: Gynecology;  Laterality: N/A;    OB History     Gravida  4   Para  2   Term  2   Preterm      AB  2   Living  2      SAB  1   IAB      Ectopic  1   Multiple      Live Births  Home Medications    Prior to Admission medications   Medication Sig Start Date End Date Taking? Authorizing Provider  acetaminophen (TYLENOL 8 HOUR) 650 MG CR tablet Take 2 tablets (1,300 mg total) by mouth every 8 (eight) hours as needed for pain. 06/16/19   Nuala Alpha, MD  albuterol (VENTOLIN HFA) 108 (90 Base) MCG/ACT inhaler Inhale 1 puff into the lungs every 4 (four) hours as needed for wheezing or shortness of breath. Patient not taking: Reported on 06/09/2022 09/19/21   Simmons-Robinson, Riki Sheer, MD  aspirin EC 81 MG tablet Take 81 mg by mouth daily. Swallow whole.    [provider]  aspirin-acetaminophen-caffeine (EXCEDRIN MIGRAINE) [phone] MG per tablet Take 1 tablet by mouth every 6 (six) hours as needed for pain or migraine.    [provider]  atorvastatin (LIPITOR) 40 MG tablet Take 1 tablet (40 mg total) by mouth daily. 06/08/22   Wells Guiles, DO  Blood Pressure KIT Check blood pressure 2-3 times week 09/05/21   Carollee Leitz, MD  diclofenac (VOLTAREN) 50 MG EC tablet Take 1 tablet (50 mg total) by mouth 2 (two) times daily. Patient not taking: Reported on 06/09/2022 03/13/22   Delsa Sale, PA  fluticasone-salmeterol (ADVAIR HFA) 115-21 MCG/ACT inhaler Inhale 2 puffs into the lungs 2 (two) times daily. Patient not taking: Reported on 06/09/2022 09/19/21   Simmons-Robinson, Riki Sheer, MD  loratadine (CLARITIN) 10 MG tablet Take 10 mg by mouth daily as needed for allergies.    [provider]  losartan (COZAAR) 25 MG tablet Take 1 tablet (25 mg total) by mouth at bedtime. 06/08/22   Wells Guiles, DO  Multiple Vitamin (MULTIVITAMIN ADULT PO) Take 1 tablet by mouth daily.    [provider]  nitroGLYCERIN (NITROSTAT) 0.4 MG SL tablet Place 1 tablet (0.4 mg total) under the tongue every 5 (five) minutes as needed for chest pain. 06/09/22 09/07/22  Skeet Latch, MD  omeprazole (PRILOSEC OTC) 20 MG tablet Take 1 tablet (20 mg total) by mouth daily. Patient taking differently: Take 20 mg by mouth daily as needed (acid reflux). 07/09/21 06/13/22  Lynden Oxford Scales, PA-C  polyethylene glycol powder (GLYCOLAX/MIRALAX) 17 GM/SCOOP powder Take 17 g by mouth daily as needed for moderate constipation. 01/15/20   [provider]  vitamin B-12 (CYANOCOBALAMIN) 500 MCG tablet Take 500 mcg by mouth 3 (three) times a week.    [provider]    Family History Family History  Problem Relation Age of Onset   Other Mother        balance issues from a "thing in her brain" requires walker   Colon cancer Mother 6       dx 2019   Cancer Father    Heart disease Maternal Grandmother    Diabetes Maternal Grandmother    Rectal cancer Neg Hx    Stomach cancer Neg Hx     Social History Social History   Tobacco Use   Smoking status: Former    Packs/day: 1.00    Years: 34.00    Total pack years: 34.00    Types:  Cigarettes    Quit date: 09/04/2015    Years since quitting: 6.8   Smokeless tobacco: Never   Tobacco comments:    Started age 45 - quit age 89  Vaping Use   Vaping Use: Former  Substance Use Topics   Alcohol use: Yes    Alcohol/week: 2.0 standard drinks of alcohol    Types: 2 Standard drinks or  equivalent per week    Comment: socially   Drug use: No     Allergies   Latex, Hydrocodone-acetaminophen, and Oxycodone-acetaminophen   Review of Systems Review of Systems  Constitutional:  Negative for chills and fever.  HENT:  Negative for ear pain and sore throat.   Eyes:  Negative for pain and visual disturbance.  Respiratory:  Negative for cough and shortness of breath.   Cardiovascular:  Negative for chest pain and palpitations.  Gastrointestinal:  Negative for abdominal pain and vomiting.  Genitourinary:  Negative for dysuria and hematuria.  Musculoskeletal:  Positive for arthralgias (left fourth toe pain). Negative for back pain.  Skin:  Negative for color change and rash.  Neurological:  Negative for seizures and syncope.  All other systems reviewed and are negative.    Physical Exam Triage Vital Signs ED Triage Vitals  Enc Vitals Group     BP 07/10/22 1557 (!) 127/93     Pulse Rate 07/10/22 1557 73     Resp 07/10/22 1557 12     Temp 07/10/22 1557 98.8 F (37.1 C)     Temp Source 07/10/22 1557 Oral     SpO2 07/10/22 1557 98 %     Weight 07/10/22 1556 216 lb (98 kg)     Height --      Head Circumference --      Peak Flow --      Pain Score 07/10/22 1556 5     Pain Loc --      Pain Edu? --      Excl. in [place]? --    No data found.  Updated Vital Signs BP (!) 127/93 (BP Location: Left Arm)   Pulse 73   Temp 98.8 F (37.1 C) (Oral)   Resp 12   Wt 216 lb (98 kg)   LMP 03/02/2012   SpO2 98%   BMI 38.26 kg/m   Visual Acuity Right Eye Distance:   Left Eye Distance:   Bilateral Distance:    Right Eye Near:   Left Eye Near:    Bilateral Near:      Physical Exam Vitals and nursing note reviewed.  Constitutional:      General: She is not in acute distress.    Appearance: She is well-developed.  HENT:     Head: Normocephalic and atraumatic.  Eyes:     Conjunctiva/sclera: Conjunctivae normal.  Cardiovascular:     Rate and Rhythm: Normal rate and regular rhythm.     Heart sounds: No murmur heard. Pulmonary:     Effort: Pulmonary effort is normal. No respiratory distress.     Breath sounds: Normal breath sounds.  Abdominal:     Palpations: Abdomen is soft.     Tenderness: There is no abdominal tenderness.  Musculoskeletal:        General: No swelling.     Cervical back: Neck supple.       Feet:  Skin:    General: Skin is warm and dry.     Capillary Refill: Capillary refill takes less than 2 seconds.  Neurological:     Mental Status: She is alert.  Psychiatric:        Mood and Affect: Mood normal.      UC Treatments / Results  Labs (all labs ordered are listed, but only abnormal results are displayed) Labs Reviewed - No data to display  EKG   Radiology DG Toe 4th Left  Result Date: 07/10/2022 CLINICAL DATA:  Pain, injury. EXAM: LEFT  FOURTH TOE COMPARISON:  None Available. FINDINGS: Oblique mildly displaced fourth toe proximal phalanx fracture. There is no intra-articular extension. No additional fracture. The remaining included digits are intact. No soft tissue gas or radiopaque foreign body. IMPRESSION: Oblique mildly displaced fourth toe proximal phalanx fracture. Electronically Signed   By: Keith Rake M.D.   On: 07/10/2022 16:39    Procedures Procedures (including critical care time)  Medications Ordered in UC Medications - No data to display  Initial Impression / Assessment and Plan / UC Course  I have reviewed the triage vital signs and the nursing notes.  Pertinent labs & imaging results that were available during my care of the patient were reviewed by me and considered in my medical decision  making (see chart for details).     Displaced fracture to the proximal phalanx of the left fourth toe.  Pt placed in post of shoe in clinic today. Advised follow up with orthopedics for further management.  Advised to call in the morning to schedule an appointment.  Supportive care discussed.  Final Clinical Impressions(s) / UC Diagnoses   Final diagnoses:  Closed displaced fracture of proximal phalanx of toe of left foot     Discharge Instructions      Wear hard soled shoe Can take ibuprofen as needed Apply ice Call Orthopedics tomorrow to make an appointment    ED Prescriptions   None    PDMP not reviewed this encounter.   Ward, Lenise Arena, PA-C 07/10/22 1711

## 2022-07-13 ENCOUNTER — Telehealth: Payer: Self-pay | Admitting: Licensed Clinical Social Worker

## 2022-07-13 NOTE — Telephone Encounter (Signed)
H&V Care Navigation CSW Progress Note  Clinical Social Worker contacted patient by phone to f/u on applications. Pt shares she has submitted them, I will f/u in a few weeks to check for any updates. Pt aware she will receive determination or any missing documents letters in the mail. If I see any updates I will try and notify her. Pt doing okay, broke her toe so is going to see a specialist for that next week. Reminded her to let them know she has applied for assistance program so they can bill her. LCSW remains available. Pt encouraged to call me if any additional questions/concerns arise.   Patient is participating in a Managed Medicaid Plan:  No, self pay only.  SDOH Screenings   Food Insecurity: Food Insecurity Present (06/14/2022)  Housing: Low Risk  (06/14/2022)  Transportation Needs: No Transportation Needs (06/14/2022)  Depression (PHQ2-9): Low Risk  (06/26/2022)  Financial Resource Strain: Medium Risk (06/14/2022)  Tobacco Use: Medium Risk (07/10/2022)   Westley Hummer, MSW, [place]  [phone]- work cell phone (preferred) [phone]- desk phone

## 2022-07-13 NOTE — Progress Notes (Unsigned)
Cardiology Office Note:    Date:  07/15/2022   ID:  Real Cons, DOB [birth year], MRN [id number]  PCP:  Orvis Brill, [place] [facility] Cardiologist:  Skeet Latch, MD     Referring MD: Orvis Brill, DO   CC: Atypical chest pain  History of Present Illness:    Deanna Scott is a 51 y.o. female with a hx of the following:  Angina Pectoris-no evidence of coronary artery disease in August 2023 GERD HLD Remote tobacco abuse -quit 4 years ago Obesity Migraines  She underwent left heart catheterization in August 2023 due to history of chest pain that revealed angiographically near normal coronary arteries with minimal luminal irregularity, no evidence of coronary microvascular disease or obstructive disease, vasospastic angina was not ruled out as acetylcholine challenge was not performed.  Dr. Tamala Julian recommended alternative explanations for her chest discomfort.   Today she presents for 1 month follow-up with her daughter. Says she stopped aspirin after she had a recent injury to her left foot around 2 weeks ago, ran into an object and is seeing orthopedic doctor next Monday.  Says she has felt some numbness in her right arm since stopping ASA and wants to know if this is cardiac related.  Notices in her right forearm more than her left forearm.  Has had 2 episodes of atypical chest pain since cardiac catheterization.  Symptoms are related to after doing something, lasts only a few seconds in duration.  Denies any worsening shortness of breath, palpitations, orthopnea, PND, dizziness, syncope, presyncope, any acute bleeding, claudication.   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   Qualifier: Diagnosis of  By: Dianah Field MD, Marcello Moores      Past Surgical History:  Procedure Laterality Date   ABDOMINAL HYSTERECTOMY     BLADDER SUSPENSION  2009   DIAGNOSTIC LAPAROSCOPY     ectopic pregnancy   DILATION AND CURETTAGE OF UTERUS     hx mab   endoscopy nasal sinus surgery  01/13/2020   [facility] - CFS Leak Repair   INTRAVASCULAR PRESSURE WIRE/FFR STUDY N/A 06/13/2022   Procedure: INTRAVASCULAR PRESSURE WIRE/FFR STUDY;  Surgeon: Belva Crome, MD;  Location: [facility];  Service: Cardiovascular;  Laterality: N/A;   LEFT HEART CATH AND CORONARY ANGIOGRAPHY N/A 06/13/2022   Procedure: LEFT HEART CATH AND CORONARY ANGIOGRAPHY;  Surgeon: Belva Crome, MD;  Location: [facility];  Service: Cardiovascular;  Laterality: N/A;   SACROSPINOUS LIGAMENT FIXATION     posterior repair wtih cysto   SVD     x 2   TUBAL LIGATION  1999   VAGINAL HYSTERECTOMY  03/13/2012   Procedure: HYSTERECTOMY VAGINAL;  Surgeon: Mora Bellman, MD;  Location: [facility];  Service: Gynecology;  Laterality: N/A;    Current Medications: Current Meds  Medication Sig   acetaminophen (TYLENOL 8 HOUR) 650 MG CR tablet Take 2 tablets (1,300 mg total) by mouth every 8 (eight) hours as needed for pain.   albuterol (VENTOLIN HFA) 108 (90 Base) MCG/ACT  inhaler Inhale 1 puff into the lungs every 4 (four) hours as needed for wheezing or shortness of breath.   aspirin-acetaminophen-caffeine (EXCEDRIN MIGRAINE) 250-250-65 MG per tablet Take 1 tablet by mouth every 6 (six) hours as needed for pain or migraine.   atorvastatin (LIPITOR) 40 MG tablet Take 1 tablet (40 mg total) by mouth daily.   Blood Pressure KIT Check blood pressure 2-3 times week   diclofenac (VOLTAREN) 50 MG EC tablet Take 1 tablet (50 mg total) by mouth 2 (two) times daily.   fluticasone-salmeterol (ADVAIR HFA) 115-21 MCG/ACT inhaler Inhale 2 puffs into the lungs 2  (two) times daily.   loratadine (CLARITIN) 10 MG tablet Take 10 mg by mouth daily as needed for allergies.   Multiple Vitamin (MULTIVITAMIN ADULT PO) Take 1 tablet by mouth daily.   nitroGLYCERIN (NITROSTAT) 0.4 MG SL tablet Place 1 tablet (0.4 mg total) under the tongue every 5 (five) minutes as needed for chest pain.   polyethylene glycol powder (GLYCOLAX/MIRALAX) 17 GM/SCOOP powder Take 17 g by mouth daily as needed for moderate constipation.   vitamin B-12 (CYANOCOBALAMIN) 500 MCG tablet Take 500 mcg by mouth 3 (three) times a week.   aspirin EC 81 MG tablet Take 81 mg by mouth daily. Swallow whole.   losartan (COZAAR) 25 MG tablet Take 1 tablet (25 mg total) by mouth at bedtime.   Allergies:   Latex, Hydrocodone-acetaminophen, and Oxycodone-acetaminophen   Social History   Socioeconomic History   Marital status: Divorced    Spouse name: Not on file   Number of children: Not on file   Years of education: Not on file   Highest education level: 12th grade  Occupational History   Not on file  Tobacco Use   Smoking status: Former    Packs/day: 1.00    Years: 34.00    Total pack years: 34.00    Types: Cigarettes    Quit date: 09/04/2015    Years since quitting: 6.8   Smokeless tobacco: Never   Tobacco comments:    Started age 34 - quit age 8  Vaping Use   Vaping Use: Former  Substance and Sexual Activity   Alcohol use: Yes    Alcohol/week: 2.0 standard drinks of alcohol    Types: 2 Standard drinks or equivalent per week    Comment: socially   Drug use: No   Sexual activity: Yes    Birth control/protection: Surgical    Comment: Hysterectomy  Other Topics Concern   Not on file  Social History Narrative   Lives at home with 2 dogs and 1 cat   Right handed   Social Determinants of Health   Financial Resource Strain: Medium Risk (06/14/2022)   Overall Financial Resource Strain (CARDIA)    Difficulty of Paying Living Expenses: Somewhat hard  Food Insecurity: Food  Insecurity Present (06/14/2022)   Hunger Vital Sign    Worried About Running Out of Food in the Last Year: Sometimes true    Ran Out of Food in the Last Year: Sometimes true  Transportation Needs: No Transportation Needs (06/14/2022)   PRAPARE - Hydrologist (Medical): No    Lack of Transportation (Non-Medical): No  Physical Activity: Not on file  Stress: Not on file  Social Connections: Not on file     Family History: The patient's family history includes Cancer in her father; Colon cancer (age of onset: 55) in her mother; Diabetes in her maternal grandmother; Heart disease in her  maternal grandmother; Other in her mother. There is no history of Rectal cancer or Stomach cancer.  ROS:   Review of Systems  Constitutional: Negative.   HENT: Negative.    Eyes: Negative.   Respiratory: Negative.    Cardiovascular:  Positive for chest pain and leg swelling. Negative for palpitations, orthopnea, claudication and PND.       Recent injury to left foot and atypical chest pain episodes.  See HPI.  Gastrointestinal: Negative.   Genitourinary: Negative.   Musculoskeletal:  Positive for joint pain. Negative for back pain, falls, myalgias and neck pain.  Skin: Negative.   Neurological: Negative.   Endo/Heme/Allergies: Negative.   Psychiatric/Behavioral: Negative.      Please see the history of present illness.    All other systems reviewed and are negative.  EKGs/Labs/Other Studies Reviewed:    The following studies were reviewed today:   EKG:  EKG is not ordered today.   Left heart cath and coronary angiography on June 13, 2022: CONCLUSIONS: Angiographically near normal coronary arteries with minimal luminal irregularity. Right dominant anatomy. Invasive diagnostic procedure: CFR 4.0 (normal greater than 2.0) and IMR 15 (normal less than 25).  Thus, no evidence of microvascular dysfunction.  Vasospastic angina was not ruled out as acetylcholine challenge was  not performed.   RECOMMENDATIONS: Consider alternative explanations for chest discomfort.  No evidence of coronary microvascular disease or obstructive disease.    Recent Labs: 09/05/2021: ALT 7 09/19/2021: TSH 1.620 06/06/2022: Hemoglobin 14.3; Platelets 242 06/26/2022: BUN 18; Creatinine, Ser 0.80; Potassium 3.7; Sodium 142  Recent Lipid Panel    Component Value Date/Time   CHOL 239 (H) 06/08/2022 1616   TRIG 187 (H) 06/08/2022 1616   HDL 43 06/08/2022 1616   CHOLHDL 5.6 (H) 06/08/2022 1616   CHOLHDL 5.2 08/18/2011 0855   VLDL 19 08/18/2011 0855   LDLCALC 162 (H) 06/08/2022 1616     Risk Assessment/Calculations:    The 10-year ASCVD risk score (Arnett DK, et al., 2019) is: 2.7%   Values used to calculate the score:     Age: 68 years     Sex: Female     Is Non-Hispanic African American: No     Diabetic: No     Tobacco smoker: No     Systolic Blood Pressure: 409 mmHg     Is BP treated: Yes     HDL Cholesterol: 43 mg/dL     Total Cholesterol: 239 mg/dL   Physical Exam:    VS:  BP 112/78   Pulse 82   Ht 5' 3"  (1.6 m)   Wt 214 lb (97.1 kg)   LMP 03/02/2012   BMI 37.91 kg/m     Wt Readings from Last 3 Encounters:  07/14/22 214 lb (97.1 kg)  07/10/22 216 lb (98 kg)  06/26/22 214 lb 6.4 oz (97.3 kg)     GEN: Obese, 51 y.o. female in no acute distress HEENT: Normal NECK: No JVD; No carotid bruits CARDIAC: RRR, S1/S2, no murmurs, rubs, gallops; 2+ peripheral pulses throughout, strong and equal bilaterally RESPIRATORY:  Clear and diminished to auscultation without rales, wheezing or rhonchi  ABDOMEN: Soft, non-tender, non-distended MUSCULOSKELETAL: Minimal, nonpitting, dependent edema +1 along BLE; No deformity  SKIN: Warm and dry NEUROLOGIC:  Alert and oriented x 3 PSYCHIATRIC:  Normal affect   ASSESSMENT:    1. Atypical chest pain   2. Hyperlipidemia, unspecified hyperlipidemia type   3. Primary hypertension   4. Class 2 obesity due to excess calories  with body mass index (BMI) of 36.0 to 36.9 in adult, unspecified whether serious comorbidity present    PLAN:    In order of problems listed above:  Atypical chest pain No evidence of coronary artery disease seen during cardiac catheterization.  Has had 2 very brief episodes of chest pain, described as sharp, associated with activity, similar to prior.  Vasospastic angina was not ruled out during cardiac cath.  Discussed medication options to rule this out. Will stop losartan and initiate Imdur 15 mg daily at her request. ED precautions discussed. Will stop ASA at her request.  Hyperlipidemia - chronic, not at goal LDL elevated at 162 in August 2023.  Continue Lipitor 40 mg daily and will consider repeat fasting lipid panel and liver enzymes at next follow-up visit.   Hypertension BP 112/78.  Discontinue losartan and initiate Imdur 15 mg daily as mentioned above. Discussed to monitor BP at home at least 2 hours after medications and sitting for 5-10 minutes.  Class II obesity - chronic Weight loss via diet and exercise encouraged once left foot injury resolves. Discussed the impact being overweight would have on cardiovascular risk.     5. Disposition: Follow-up with me or Laurann Montana, NP in 1 month or sooner if anything changes.   Medication Adjustments/Labs and Tests Ordered: Current medicines are reviewed at length with the patient today.  Concerns regarding medicines are outlined above.  No orders of the defined types were placed in this encounter.  Meds ordered this encounter  Medications   isosorbide mononitrate (IMDUR) 30 MG 24 hr tablet    Sig: Take 0.5 tablets (15 mg total) by mouth daily.    Dispense:  45 tablet    Refill:  3    Patient Instructions  Medication Instructions:  Your physician has recommended you make the following change in your medication:   Stop: Aspirin  Stop: Losartan  Start: Imdur 47m  half tablet daily   *If you need a refill on your  cardiac medications before your next appointment, please call your pharmacy*   Follow-Up: [facility] and your health needs are our priority.  As part of our continuing mission to provide you with exceptional heart care, we have created designated Provider Care Teams.  These Care Teams include your primary Cardiologist (physician) and Advanced Practice Providers (APPs -  Physician Assistants and Nurse Practitioners) who all work together to provide you with the care you need, when you need it.  We recommend signing up for the patient portal called "MyChart".  Sign up information is provided on this After Visit Summary.  MyChart is used to connect with patients for Virtual Visits (Telemedicine).  Patients are able to view lab/test results, encounter notes, upcoming appointments, etc.  Non-urgent messages can be sent to your provider as well.   To learn more about what you can do with MyChart, go to [email or web address]    Your next appointment:   1 month(s)  The format for your next appointment:   In Person  Provider:   CLaurann Montana NP or EFinis Bud NP     Other Instructions Heart Healthy Diet Recommendations: A low-salt diet is recommended. Meats should be grilled, baked, or boiled. Avoid fried foods. Focus on lean protein sources like fish or chicken with vegetables and fruits. The American Heart Association is a GMicrobiologist  American Heart Association Diet and Lifeystyle Recommendations   Exercise recommendations: The American Heart Association recommends 150 minutes of moderate intensity exercise  weekly. Try 30 minutes of moderate intensity exercise 4-5 times per week. This could include walking, jogging, or swimming.   Important Information About Sugar         Signed, Finis Bud, NP  07/15/2022 8:15 AM    [place]

## 2022-07-14 ENCOUNTER — Ambulatory Visit (INDEPENDENT_AMBULATORY_CARE_PROVIDER_SITE_OTHER): Payer: Self-pay | Admitting: Nurse Practitioner

## 2022-07-14 ENCOUNTER — Other Ambulatory Visit: Payer: Self-pay

## 2022-07-14 ENCOUNTER — Encounter (HOSPITAL_BASED_OUTPATIENT_CLINIC_OR_DEPARTMENT_OTHER): Payer: Self-pay | Admitting: Nurse Practitioner

## 2022-07-14 VITALS — BP 112/78 | HR 82 | Ht 63.0 in | Wt 214.0 lb

## 2022-07-14 DIAGNOSIS — E6609 Other obesity due to excess calories: Secondary | ICD-10-CM

## 2022-07-14 DIAGNOSIS — R0789 Other chest pain: Secondary | ICD-10-CM

## 2022-07-14 DIAGNOSIS — I10 Essential (primary) hypertension: Secondary | ICD-10-CM

## 2022-07-14 DIAGNOSIS — Z6836 Body mass index (BMI) 36.0-36.9, adult: Secondary | ICD-10-CM

## 2022-07-14 DIAGNOSIS — E785 Hyperlipidemia, unspecified: Secondary | ICD-10-CM

## 2022-07-14 MED ORDER — ISOSORBIDE MONONITRATE ER 30 MG PO TB24
15.0000 mg | ORAL_TABLET | Freq: Every day | ORAL | 3 refills | Status: DC
  Filled 2022-07-14: qty 15, 30d supply, fill #0
  Filled 2022-08-18: qty 15, 30d supply, fill #1
  Filled 2022-10-10: qty 15, 30d supply, fill #2
  Filled 2022-10-10: qty 15, 30d supply, fill #0

## 2022-07-14 NOTE — Patient Instructions (Addendum)
Medication Instructions:  Your physician has recommended you make the following change in your medication:   Stop: Aspirin  Stop: Losartan  Start: Imdur '15mg'$   half tablet daily   *If you need a refill on your cardiac medications before your next appointment, please call your pharmacy*   Follow-Up: At [facility], you and your health needs are our priority.  As part of our continuing mission to provide you with exceptional heart care, we have created designated Provider Care Teams.  These Care Teams include your primary Cardiologist (physician) and Advanced Practice Providers (APPs -  Physician Assistants and Nurse Practitioners) who all work together to provide you with the care you need, when you need it.  We recommend signing up for the patient portal called "MyChart".  Sign up information is provided on this After Visit Summary.  MyChart is used to connect with patients for Virtual Visits (Telemedicine).  Patients are able to view lab/test results, encounter notes, upcoming appointments, etc.  Non-urgent messages can be sent to your provider as well.   To learn more about what you can do with MyChart, go to [email or web address].    Your next appointment:   1 month(s)  The format for your next appointment:   In Person  Provider:   Laurann Montana, NP or Finis Bud, NP     Other Instructions Heart Healthy Diet Recommendations: A low-salt diet is recommended. Meats should be grilled, baked, or boiled. Avoid fried foods. Focus on lean protein sources like fish or chicken with vegetables and fruits. The American Heart Association is a Microbiologist!  American Heart Association Diet and Lifeystyle Recommendations   Exercise recommendations: The American Heart Association recommends 150 minutes of moderate intensity exercise weekly. Try 30 minutes of moderate intensity exercise 4-5 times per week. This could include walking, jogging, or swimming.   Important  Information About Sugar

## 2022-07-15 MED ORDER — ISOSORBIDE MONONITRATE ER 30 MG PO TB24: 15.0000 mg | ORAL_TABLET | Freq: Every day | ORAL | 3 refills | Status: DC

## 2022-07-17 ENCOUNTER — Ambulatory Visit (INDEPENDENT_AMBULATORY_CARE_PROVIDER_SITE_OTHER): Payer: Self-pay | Admitting: Sports Medicine

## 2022-07-17 ENCOUNTER — Encounter: Payer: Self-pay | Admitting: Sports Medicine

## 2022-07-17 ENCOUNTER — Ambulatory Visit (INDEPENDENT_AMBULATORY_CARE_PROVIDER_SITE_OTHER): Payer: Self-pay

## 2022-07-17 ENCOUNTER — Telehealth (HOSPITAL_BASED_OUTPATIENT_CLINIC_OR_DEPARTMENT_OTHER): Payer: Self-pay

## 2022-07-17 ENCOUNTER — Other Ambulatory Visit: Payer: Self-pay

## 2022-07-17 VITALS — Ht 62.0 in | Wt 214.0 lb

## 2022-07-17 DIAGNOSIS — M79672 Pain in left foot: Secondary | ICD-10-CM

## 2022-07-17 DIAGNOSIS — E785 Hyperlipidemia, unspecified: Secondary | ICD-10-CM

## 2022-07-17 DIAGNOSIS — S92512A Displaced fracture of proximal phalanx of left lesser toe(s), initial encounter for closed fracture: Secondary | ICD-10-CM

## 2022-07-17 NOTE — Progress Notes (Signed)
Still having some pain but states feels like it is getting better. Wearing post op shoe. Denies medication for pain.

## 2022-07-17 NOTE — Telephone Encounter (Addendum)
Called patient with the following recommendations, patient is agreeable and will take them to her PCP to have drawn!    ----- Message from Finis Bud, NP sent at 07/16/2022  3:17 PM EDT ----- Regarding: LFT and FLP Hello Deanna Scott,   This patient was started on cholesterol medication last month. I would like to check her fasting lipid panel and liver function enzymes before her follow up visit with me next month. I apologize I should have mentioned this to you last Friday.   Thanks for all of your help! I really enjoy working with you all!   Take Care,  Finis Bud, NP

## 2022-07-17 NOTE — Progress Notes (Signed)
Kanani Mowbray - 51 y.o. female MRN [id number]  Date of birth: 27-Jan-1971  Office Visit Note: Visit Date: 07/17/2022 PCP: Orvis Brill, DO Referred by: Orvis Brill, DO  Subjective: Chief Complaint  Patient presents with   Left Foot - Pain   HPI: Deanna Scott is a pleasant 51 y.o. female who presents today for left fourth proximal phalanx fracture.  Patient stubbed her toe on the end of her bed about 2 weeks ago.  She was seen in the urgent care on 07/10/2022 and was provided a fracture shoe.  Prior to this, she had been walking in her shoes which caused her pain.  She states it does feel better in the fracture shoe.  She is self-employed and works as a Scientist, product/process development, she has continue working through this.  She did bump the toe a few days ago.  Pain is improving although still present.  Denies any redness new bruising. Not taking any medication for this currently.  Pertinent ROS were reviewed with the patient and found to be negative unless otherwise specified above in HPI.   Assessment & Plan: Visit Diagnoses:  1. Closed displaced fracture of proximal phalanx of lesser toe of left foot, initial encounter   2. Pain in left foot    Plan: Discussed the patient's fracture with her today, is a spiral mildly displaced fracture of the fourth proximal phalanx.  There is no intra-articular extension.  I do think that this will heal well nonoperatively.  Did recommend however that she continue in the postop surgical shoe for the next 2 weeks.  If her pain is much improved after this she can transition to a rigid sole shoe as her pain allows.  Recommended ankle pumps 3 times daily to avoid stiffness. She will follow-up with me at the 3-week mark, which will be about 5 weeks out from her initial injury, we will make a decision on return to work and transition to normal shoewear if indicated at that time.  She may continue to ice, recommend Tylenol for pain control.  Follow-up: Return in about 3 weeks  (around 08/07/2022) for with Dr. Rolena Infante for toe fx.   Meds & Orders: No orders of the defined types were placed in this encounter.   Orders Placed This Encounter  Procedures   XR Foot Complete Left     Procedures: No procedures performed      Clinical History: No specialty comments available.  She reports that she quit smoking about 6 years ago. Her smoking use included cigarettes. She has a 34.00 pack-year smoking history. She has never used smokeless tobacco.  Recent Labs    09/05/21 1417 06/08/22 1507  HGBA1C 6.2* 6.1    Objective:   Vital Signs: Ht '5\' 2"'$  (1.575 m)   Wt 214 lb (97.1 kg)   LMP 03/02/2012   BMI 39.14 kg/m   Physical Exam  Gen: Well-appearing, in no acute distress; non-toxic CV: Regular Rate. Well-perfused. Warm.  Resp: Breathing unlabored on room air; no wheezing. Psych: Fluid speech in conversation; appropriate affect; normal thought process Neuro: Sensation intact throughout. No gross coordination deficits.   Ortho Exam - Left foot: There is some very mild soft tissue swelling of the fourth digit.  No significant erythema or ecchymosis.  There is full range of motion about the ankle.  There is tenderness to palpation of the fourth phalange E.  She is able to wiggle all 5 toes without difficulty, although some pain with active and passive range of motion  of the left fourth phalanx.  Neurovascular intact distally.  Sensation grossly intact to light touch.  Imaging: XR Foot Complete Left  Result Date: 07/17/2022 3 views of the left foot including AP, oblique and lateral films were ordered and reviewed by myself.  X-rays reviewed demonstrate a spiral fracture of the left fourth proximal phalange.  Lateral view does show a slight amount of volar displacement.    *Independent review of the fourth toe x-ray from 07/10/2022 shows an oblique mildly displaced fourth toe proximal phalanx fracture.  There does appear to be a volar tilt to the mild displacement of the  fracture.  There is no intra-articular extension.  Past Medical/Family/Surgical/Social History: Medications & Allergies reviewed per EMR, new medications updated. Patient Active Problem List   Diagnosis Date Noted   Breast pain, left 06/09/2022   Angina pectoris ([place]) 06/08/2022   Environmental allergies 06/08/2022   Prediabetes 10/05/2021   Dyslipidemia 10/05/2021   HTN (hypertension) 09/19/2021   Hyperlipidemia 09/06/2021   Headache 02/01/2021   BV (bacterial vaginosis) 11/11/2020   Snoring 06/08/2020   Pinched nerve 06/07/2020   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   Extremity numbness 10/24/2019   Shortness of breath 10/23/2019   Lateral epicondylitis 10/08/2019   Fatigue 10/08/2019   Screening breast examination 10/07/2019   Triceps tendonitis 07/21/2019   Low back pain 06/18/2019   Chronic sinusitis 10/18/2018   Benign paroxysmal positional vertigo 10/18/2018   Vaginal wall prolapse 07/29/2017   Migraines 07/27/2017   GERD (gastroesophageal reflux disease) 07/29/2014   OBESITY, NOS 01/03/2007   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   Qualifier: Diagnosis of  By: Dianah Field MD, Marcello Moores     Family History  Problem Relation Age of Onset   Other Mother        balance issues from a "thing in her brain" requires walker   Colon cancer Mother 55       dx 2019   Cancer Father    Heart disease Maternal Grandmother    Diabetes Maternal Grandmother    Rectal cancer Neg Hx    Stomach cancer Neg Hx    Past Surgical History:  Procedure Laterality Date   ABDOMINAL HYSTERECTOMY     BLADDER SUSPENSION  2009   DIAGNOSTIC LAPAROSCOPY      ectopic pregnancy   DILATION AND CURETTAGE OF UTERUS     hx mab   endoscopy nasal sinus surgery  01/13/2020   [facility] - CFS Leak Repair   INTRAVASCULAR PRESSURE WIRE/FFR STUDY N/A 06/13/2022   Procedure: INTRAVASCULAR PRESSURE WIRE/FFR STUDY;  Surgeon: Belva Crome, MD;  Location: [facility];  Service: Cardiovascular;  Laterality: N/A;   LEFT HEART CATH AND CORONARY ANGIOGRAPHY N/A 06/13/2022   Procedure: LEFT HEART CATH AND CORONARY ANGIOGRAPHY;  Surgeon: Belva Crome, MD;  Location: [place];  Service: Cardiovascular;  Laterality: N/A;   SACROSPINOUS LIGAMENT FIXATION     posterior repair wtih cysto   SVD     x 2   TUBAL LIGATION  1999  VAGINAL HYSTERECTOMY  03/13/2012   Procedure: HYSTERECTOMY VAGINAL;  Surgeon: Mora Bellman, MD;  Location: [facility];  Service: Gynecology;  Laterality: N/A;   Social History   Occupational History   Not on file  Tobacco Use   Smoking status: Former    Packs/day: 1.00    Years: 34.00    Total pack years: 34.00    Types: Cigarettes    Quit date: 09/04/2015    Years since quitting: 6.8   Smokeless tobacco: Never   Tobacco comments:    Started age 64 - quit age 82  Vaping Use   Vaping Use: Former  Substance and Sexual Activity   Alcohol use: Yes    Alcohol/week: 2.0 standard drinks of alcohol    Types: 2 Standard drinks or equivalent per week    Comment: socially   Drug use: No   Sexual activity: Yes    Birth control/protection: Surgical    Comment: Hysterectomy

## 2022-08-02 ENCOUNTER — Telehealth: Payer: Self-pay | Admitting: Licensed Clinical Social Worker

## 2022-08-02 NOTE — Telephone Encounter (Signed)
H&V Care Navigation CSW Progress Note  Clinical Social Worker  was contacted by pt  to update me that she was approved for [employer]. She was contacted by [facility] for additional tax documents- she shares that her daughter usually assists her with obtaining those and at this time she isnt available to do so, she will likely call to close application as she is able to afford her current medications via [place] at low cost. LCSW remains available for any additional questions/concerns that may arise.    Patient is participating in a Managed Medicaid Plan:  No, self pay, approved for [employer] and [employer].   SDOH Screenings   Food Insecurity: Food Insecurity Present (06/14/2022)  Housing: Low Risk  (06/14/2022)  Transportation Needs: No Transportation Needs (06/14/2022)  Depression (PHQ2-9): Low Risk  (06/26/2022)  Financial Resource Strain: Medium Risk (06/14/2022)  Tobacco Use: Medium Risk (07/17/2022)   Westley Hummer, MSW, [place]  [phone]- work cell phone (preferred) [phone]- desk phone

## 2022-08-02 NOTE — Telephone Encounter (Signed)
H&V Care Navigation CSW Progress Note  Clinical Social Worker contacted patient by phone to f/u on CAFA. Reached her at [phone]. Pt was approved for 100% assistance, mailed letter and sent text at her request with approval dates and number for [facility] Customer Service. I will f/u on [facility] and [employer] also.   Patient is participating in a Managed Medicaid Plan:  No, self pay, CAFA approved  [place]: Food Insecurity Present (06/14/2022)  Housing: Low Risk  (06/14/2022)  Transportation Needs: No Transportation Needs (06/14/2022)  Depression (PHQ2-9): Low Risk  (06/26/2022)  Financial Resource Strain: Medium Risk (06/14/2022)  Tobacco Use: Medium Risk (07/17/2022)    Westley Hummer, MSW, [place]  [phone]- work cell phone (preferred) [phone]- desk phone

## 2022-08-04 ENCOUNTER — Telehealth (HOSPITAL_BASED_OUTPATIENT_CLINIC_OR_DEPARTMENT_OTHER): Payer: Self-pay

## 2022-08-04 LAB — LIPID PANEL
Chol/HDL Ratio: 3.3 ratio (ref 0.0–4.4)
Cholesterol, Total: 147 mg/dL (ref 100–199)
HDL: 45 mg/dL (ref >39)
LDL Chol Calc (NIH): 80 mg/dL (ref 0–99)
Triglycerides: 126 mg/dL (ref 0–149)
VLDL Cholesterol Cal: 22 mg/dL (ref 5–40)

## 2022-08-04 LAB — HEPATIC FUNCTION PANEL
ALT: 13 IU/L (ref 0–32)
AST: 22 IU/L (ref 0–40)
Albumin: 4.3 g/dL (ref 3.8–4.9)
Alkaline Phosphatase: 137 IU/L — ABNORMAL HIGH (ref 44–121)
Bilirubin Total: 0.4 mg/dL (ref 0.0–1.2)
Bilirubin, Direct: 0.13 mg/dL (ref 0.00–0.40)
Total Protein: 7.2 g/dL (ref 6.0–8.5)

## 2022-08-04 NOTE — Telephone Encounter (Addendum)
Results called to patient who verbalizes understanding!     ----- Message from Finis Bud, NP sent at 08/04/2022  2:06 PM EDT ----- Please update Deanna Scott regarding her blood work results.    All her recent blood work results are normal with the exception of alkaline phosphatase, multiple causes for this. I recommend she follows up with her PCP regarding this.  Thank you so much!  Finis Bud, AGNP-C

## 2022-08-07 ENCOUNTER — Ambulatory Visit: Payer: No Typology Code available for payment source | Admitting: Sports Medicine

## 2022-08-10 ENCOUNTER — Ambulatory Visit (INDEPENDENT_AMBULATORY_CARE_PROVIDER_SITE_OTHER): Payer: Self-pay

## 2022-08-10 ENCOUNTER — Encounter: Payer: Self-pay | Admitting: Sports Medicine

## 2022-08-10 ENCOUNTER — Ambulatory Visit (INDEPENDENT_AMBULATORY_CARE_PROVIDER_SITE_OTHER): Payer: Self-pay | Admitting: Sports Medicine

## 2022-08-10 DIAGNOSIS — M79672 Pain in left foot: Secondary | ICD-10-CM

## 2022-08-10 DIAGNOSIS — S92512A Displaced fracture of proximal phalanx of left lesser toe(s), initial encounter for closed fracture: Secondary | ICD-10-CM

## 2022-08-10 NOTE — Progress Notes (Signed)
Doing good; minimal pain Able to move the toes without pain Minimal swelling In a regular shoe

## 2022-08-10 NOTE — Progress Notes (Signed)
Deanna Scott - 51 y.o. female MRN [id number]  Date of birth: May 28, 1971  Office Visit Note: Visit Date: 08/10/2022 PCP: Orvis Brill, DO Referred by: Orvis Brill, DO  Subjective: Chief Complaint  Patient presents with   Left Foot - Follow-up, Fracture   HPI: Deanna Scott is a pleasant 51 y.o. female who presents today for follow-up of left fourth proximal phalanx fracture.  She is between 5-6 weeks from her initial injury.  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  Over the last few days she has transition out of the fracture shoe and into regular shoes.  She denies any residual pain.  She states when she walks longer distances she will feel a small amount of pain or burning sensation.  No new injury.  She is not taking any medication currently.  Pertinent ROS were reviewed with the patient and found to be negative unless otherwise specified above in HPI.   Assessment & Plan: Visit Diagnoses:  1. Closed displaced fracture of proximal phalanx of lesser toe of left foot, initial encounter   2. Pain in left foot    Plan: Discussed with Meela that her fracture is clinically and radiographically healing.  I feel it is safe that she goes out of the fracture shoe and into regular tennis shoes.  Did recommend for the next week or 2 wearing a tennis shoe with some sole support as opposed to walking barefoot or with sandals/flip-flops.  She is understanding with this.  May use topical Voltaren gel for any residual pain. Did discuss she can have some burning or tingling that lingers for a few more weeks, however if this does not improve she will follow-up.  Otherwise follow-up as needed.  Follow-up: Return if symptoms worsen or fail to improve.   Meds & Orders: No orders of the defined types were placed in this encounter.   Orders Placed This Encounter  Procedures   XR Foot Complete Left      Procedures: No procedures performed      Clinical History: No specialty comments available.  She reports that she quit smoking about 6 years ago. Her smoking use included cigarettes. She has a 34.00 pack-year smoking history. She has never used smokeless tobacco.  Recent Labs    09/05/21 1417 06/08/22 1507  HGBA1C 6.2* 6.1    Objective:   Vital Signs: LMP 03/02/2012   Physical Exam  Gen: Well-appearing, in no acute distress; non-toxic CV: Regular Rate. Well-perfused. Warm.  Resp: Breathing unlabored on room air; no wheezing. Psych: Fluid speech in conversation; appropriate affect; normal thought process Neuro: Sensation intact throughout. No gross coordination deficits.   Ortho Exam -Left foot: No more TTP at the proximal phalanx of the fourth toe.  She is able to wiggle all 5 toes without difficulty.  Stands on the feet without pain.  Full range of motion and strength about the ankle.  Neurovascular intact distally.  Imaging: XR Foot Complete Left  Result Date: 08/10/2022 3 views of the left foot including AP, lateral and oblique films were ordered and reviewed by myself.  X-rays demonstrate a healing oblique fracture of the proximal fourth phalanx.  There is callus formation and closure of the fracture gap.   Past Medical/Family/Surgical/Social History: Medications & Allergies reviewed per EMR, new medications updated. 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   Extremity numbness 10/24/2019   Shortness of breath 10/23/2019   Lateral epicondylitis 10/08/2019   Fatigue 10/08/2019   Screening breast examination  10/07/2019   Triceps tendonitis 07/21/2019   Low back pain 06/18/2019   Chronic sinusitis 10/18/2018   Benign paroxysmal positional vertigo 10/18/2018   Vaginal wall prolapse 07/29/2017   Migraines 07/27/2017   GERD (gastroesophageal reflux disease) 07/29/2014   OBESITY, NOS 01/03/2007   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   Qualifier: Diagnosis of  By: Dianah Field MD, Marcello Moores     Family History  Problem Relation Age of Onset   Other Mother        balance issues from a "thing in her brain" requires walker   Colon cancer Mother 80       dx 2019   Cancer Father    Heart disease Maternal Grandmother    Diabetes Maternal Grandmother    Rectal cancer Neg Hx    Stomach cancer Neg Hx    Past Surgical History:  Procedure Laterality Date   ABDOMINAL HYSTERECTOMY     BLADDER SUSPENSION  2009   DIAGNOSTIC LAPAROSCOPY     ectopic pregnancy   DILATION AND CURETTAGE OF UTERUS     hx mab   endoscopy nasal sinus surgery  01/13/2020   [facility] - CFS Leak Repair   INTRAVASCULAR PRESSURE WIRE/FFR STUDY N/A 06/13/2022   Procedure: INTRAVASCULAR PRESSURE WIRE/FFR STUDY;  Surgeon: Belva Crome, MD;  Location: [place] [facility];  Service: Cardiovascular;  Laterality: N/A;   LEFT HEART CATH AND CORONARY ANGIOGRAPHY N/A 06/13/2022   Procedure: LEFT HEART CATH AND CORONARY ANGIOGRAPHY;  Surgeon: Belva Crome, MD;  Location: [place] [facility];  Service: Cardiovascular;  Laterality: N/A;   SACROSPINOUS LIGAMENT FIXATION     posterior repair wtih cysto   SVD     x 2   TUBAL LIGATION  1999   VAGINAL HYSTERECTOMY  03/13/2012   Procedure:  HYSTERECTOMY VAGINAL;  Surgeon: Mora Bellman, MD;  Location: [facility];  Service: Gynecology;  Laterality: N/A;   Social History   Occupational History   Not on file  Tobacco Use   Smoking status: Former    Packs/day: 1.00    Years: 34.00    Total pack years: 34.00    Types: Cigarettes    Quit date: 09/04/2015    Years since quitting: 6.9   Smokeless tobacco: Never   Tobacco comments:    Started age 31 - quit age 60  Vaping Use   Vaping Use: Former  Substance and Sexual Activity   Alcohol use: Yes    Alcohol/week: 2.0 standard drinks of alcohol    Types: 2 Standard drinks or equivalent per week    Comment:  socially   Drug use: No   Sexual activity: Yes    Birth control/protection: Surgical    Comment: Hysterectomy

## 2022-08-11 ENCOUNTER — Ambulatory Visit (INDEPENDENT_AMBULATORY_CARE_PROVIDER_SITE_OTHER): Payer: Self-pay | Admitting: Student

## 2022-08-11 ENCOUNTER — Encounter: Payer: Self-pay | Admitting: Student

## 2022-08-11 DIAGNOSIS — I10 Essential (primary) hypertension: Secondary | ICD-10-CM

## 2022-08-11 DIAGNOSIS — R748 Abnormal levels of other serum enzymes: Secondary | ICD-10-CM | POA: Insufficient documentation

## 2022-08-11 DIAGNOSIS — E785 Hyperlipidemia, unspecified: Secondary | ICD-10-CM

## 2022-08-11 NOTE — Assessment & Plan Note (Signed)
On atorvastatin 40 mg daily.  Her LDL has been driven down wonderfully to 80. We extensively discussed the importance of this medication, as patient was wondering if she could stop taking it. She understands the importance of this medication as she is a former smoker, is being treated for hypertension.  Although, her ASCVD risk score is low.  She also has a history of atypical chest pain.

## 2022-08-11 NOTE — Patient Instructions (Signed)
It was great seeing you today.  We will plan to see you again in 3 months for a repeat hepatic function panel.  Continue taking your blood pressure medicine and your atorvostatin. Continue doing an excellent job with not smoking!   If you have any questions or concerns, please feel free to call the clinic.    Be well,  Dr. Orvis Brill [facility] [phone]

## 2022-08-11 NOTE — Assessment & Plan Note (Signed)
Blood pressure 126/78, at goal. Continue Imdur, per cardiology.

## 2022-08-11 NOTE — Progress Notes (Signed)
    SUBJECTIVE:   CHIEF COMPLAINT / HPI:   Deanna Scott is a 51 year old female here for follow-up on abnormal blood work on 08/03/2022 from her cardiologist.  Elevated alkaline phosphatase She had an elevated alkaline phosphatase to 137, prior values were normal 11 months ago.  The rest of her hepatic function panel was within normal limits with AST 22, ALT 13, total bilirubin 0.4, direct bilirubin 0.13.  Atypical chest pain Cardiologist stopped losartan, initiated Imdur 15 mg daily.  No CAD was seen during cardiac cath. Denies any chest pain today.  Hyperlipidemia She is wondering if she needs to continue to take her atorvastatin 40 mg daily.  Her lipid panel obtained 2 months ago showed total cholesterol elevated 239, LDL cholesterol 162.  She was started on her statin at that time, and repeat lipid panel 8 days ago shows significant improvement with cholesterol of 147, LDL of 80.  PERTINENT  PMH / PSH: Reviewed  OBJECTIVE:   BP 126/78   Pulse 72   Ht '5\' 2"'$  (1.575 m)   Wt 215 lb (97.5 kg)   LMP 03/02/2012   SpO2 97%   BMI 39.32 kg/m   General: Alert and cooperative and appears to be in no acute distress Cardio: Normal S1 and S2, no S3 or S4. Rhythm is regular. No murmurs or rubs.   Pulm: Clear to auscultation bilaterally, no crackles, wheezing, or diminished breath sounds. Normal respiratory effort Abdomen: Bowel sounds normal. Abdomen soft and non-tender.  Extremities: No peripheral edema. Warm/ well perfused.  Strong radial pulses. Neuro: Cranial nerves grossly intact    ASSESSMENT/PLAN:   Elevated alkaline phosphatase level This is her first isolated elevation in alkaline phosphatase. Her elevated alkaline phosphatase could be secondary to healing left fourth proximal phalanx fracture on 07/10/2022, although it is very minorly elevated 10 points above normal. Plan to repeat hepatic function panel in 3 months to monitor.  HTN (hypertension) Blood pressure 126/78, at  goal. Continue Imdur, per cardiology.   Hyperlipidemia On atorvastatin 40 mg daily.  Her LDL has been driven down wonderfully to 80. We extensively discussed the importance of this medication, as patient was wondering if she could stop taking it. She understands the importance of this medication as she is a former smoker, is being treated for hypertension.  Although, her ASCVD risk score is low.  She also has a history of atypical chest pain.      Orvis Brill, [place]

## 2022-08-11 NOTE — Assessment & Plan Note (Addendum)
This is her first isolated elevation in alkaline phosphatase. Her elevated alkaline phosphatase could be secondary to healing left fourth proximal phalanx fracture on 07/10/2022, although it is very minorly elevated 10 points above normal. Plan to repeat hepatic function panel in 3 months to monitor.

## 2022-08-14 ENCOUNTER — Ambulatory Visit (HOSPITAL_BASED_OUTPATIENT_CLINIC_OR_DEPARTMENT_OTHER): Payer: Self-pay | Admitting: Family

## 2022-08-18 ENCOUNTER — Other Ambulatory Visit: Payer: Self-pay

## 2022-08-21 ENCOUNTER — Ambulatory Visit (INDEPENDENT_AMBULATORY_CARE_PROVIDER_SITE_OTHER): Payer: Self-pay | Admitting: Family Medicine

## 2022-08-21 ENCOUNTER — Other Ambulatory Visit: Payer: Self-pay

## 2022-08-21 VITALS — BP 123/89 | HR 65 | Ht 62.0 in | Wt 214.4 lb

## 2022-08-21 DIAGNOSIS — M79671 Pain in right foot: Secondary | ICD-10-CM

## 2022-08-21 NOTE — Progress Notes (Signed)
    SUBJECTIVE:   CHIEF COMPLAINT / HPI:   Patient presents with right foot pain that started about 3 weeks ago. Denies fever, chills, swelling or redness. She thinks that she may have stepped on something at home and that is when the pain started, otherwise denies any other injury or trauma. Denies any other pain.   OBJECTIVE:   BP 123/89   Pulse 65   Ht '5\' 2"'$  (1.575 m)   Wt 214 lb 6.4 oz (97.3 kg)   LMP 03/02/2012   SpO2 99%   BMI 39.21 kg/m    General: Patient well-appearing, in no acute distress. CV: RRR, no murmurs or gallops auscultated Resp: CTAB, no wheezing, rales or rhonchi noted MSK: no gross deformity upon inspection, no erythema or edema noted bilaterally, full active and passive ROM bilaterally, no foreign object noted, point tenderness along proximal portion of right sole with subcutaneous nodule of skin overgrowth measuring less than 0.5 cm  Neuro: 5/5 LE strength bilaterally, gross sensation intact, normal gait   ASSESSMENT/PLAN:   Right foot pain -likely secondary to splinter previously removed, may have initially been a foreign object present but no longer there. No suspicion for other MSK etiology. Antibiotics not indicated as patient remains afebrile and not showing signs of local or systemic infection. Does not appear to be cellulitis.  -apply ice and try to place less pressure on it to allow area to heal, explained that sometimes after a foreign body is removed it can still cause a little pain -follow up with PCP as appropriate      Lennie Vasco Larae Grooms, [place]

## 2022-08-21 NOTE — Patient Instructions (Signed)
It was great seeing you today!  Today we discussed your foot pain, it seems that this is coming from a splinter or piece of foreign body that was present but is not longer there. Use ice and try to place pressure elsewhere on your foot and the skin overgrowth should heal on its own since there is no foreign body that needs removal.   Please follow up at your next scheduled appointment, if anything arises between now and then, please don't hesitate to contact our office.   Thank you for allowing [place] to be a part of your medical care!  Thank you, Dr. Larae Grooms

## 2022-08-21 NOTE — Assessment & Plan Note (Signed)
-  likely secondary to splinter previously removed, may have initially been a foreign object present but no longer there. No suspicion for other MSK etiology. Antibiotics not indicated as patient remains afebrile and not showing signs of local or systemic infection. Does not appear to be cellulitis.  -apply ice and try to place less pressure on it to allow area to heal, explained that sometimes after a foreign body is removed it can still cause a little pain -follow up with PCP as appropriate

## 2022-09-15 ENCOUNTER — Ambulatory Visit (INDEPENDENT_AMBULATORY_CARE_PROVIDER_SITE_OTHER): Payer: Self-pay | Admitting: Family Medicine

## 2022-09-15 ENCOUNTER — Other Ambulatory Visit: Payer: Self-pay

## 2022-09-15 VITALS — BP 133/94 | HR 64 | Wt 213.8 lb

## 2022-09-15 DIAGNOSIS — R197 Diarrhea, unspecified: Secondary | ICD-10-CM

## 2022-09-15 NOTE — Progress Notes (Unsigned)
    SUBJECTIVE:   CHIEF COMPLAINT / HPI:   Patient presents for diarrhea. She states it has been ongoing for over a year. Had 2-3 episoes this month. Usually only 24 hours of diarrhea. States last night woke up every hour last night. Has a lot of blood in stool which she says has been intermittent for the past year. Also has really bad cramps over the past 2-3 days. States these episodes usually occur with bad cramps, then diarrhea and blood in her stool. States she had a colonoscopy when she was having similar symptoms. Denies any pain with eating. Denies vomiting. States she feels nauseous sometimes. Currently staying hydrated with water and gatorade. Eating less for fear of diarrhea.    States she is supposed to be on medication but is only taking medication for acid reflux and allergies    PERTINENT  PMH / PSH: Reviewed   OBJECTIVE:   BP (!) 133/94   Pulse 64   Wt 213 lb 12.8 oz (97 kg)   LMP 03/02/2012   SpO2 100%   BMI 39.10 kg/m    Physical exam General: well appearing, NAD Cardiovascular: RRR, no murmurs Lungs: CTAB. Normal WOB Abdomen: soft, non-distended, non-tender Skin: warm, dry. No edema  ASSESSMENT/PLAN:   No problem-specific Assessment & Plan notes found for this encounter.   Bloody diarrhea  Patient endorses diarrhea, abdominal cramping, and hematochezia occurring intermittently for over a year but usually happen over 24 hour periods but this week has been ongoing for 3 days. Physical exam reassuring without abdominal tenderness. Colonoscopy in 2021 showed polyps and diverticula in left colon. She has known diverticulosis but symptoms potentially due to diverticulitis though reassuring that she has remained afebrile and without sharp abdominal pain only cramping during her diarrhea episodes. Less likely gastroenteritis given intermittent occurrences for the past year. Considered colorectal cancer, IBS, IBD. Will obtain CBC to check white count, CMP to check  electrolytes given diarrhea and also elevated alk phos on last labs, and will refer to GI for further evaluation. Patient agreeable with plan and return precautions discussed.   [place]

## 2022-09-15 NOTE — Patient Instructions (Signed)
It was great seeing you today!  You came in for diarrhea, blood in the stool and we are checking some blood work.  I have also referred you to our GI specialist who will call you to schedule an appointment for further evaluation.  Feel free to call with any questions or concerns at any time, at [phone].   Take care,  Dr. Shary Key [facility]

## 2022-09-16 DIAGNOSIS — R197 Diarrhea, unspecified: Secondary | ICD-10-CM | POA: Insufficient documentation

## 2022-09-16 LAB — COMPREHENSIVE METABOLIC PANEL
ALT: 8 IU/L (ref 0–32)
AST: 18 IU/L (ref 0–40)
Albumin/Globulin Ratio: 1.6 (ref 1.2–2.2)
Albumin: 4.4 g/dL (ref 3.8–4.9)
Alkaline Phosphatase: 126 IU/L — ABNORMAL HIGH (ref 44–121)
BUN/Creatinine Ratio: 22 (ref 9–23)
BUN: 17 mg/dL (ref 6–24)
Bilirubin Total: 0.2 mg/dL (ref 0.0–1.2)
CO2: 23 mmol/L (ref 20–29)
Calcium: 8.8 mg/dL (ref 8.7–10.2)
Chloride: 101 mmol/L (ref 96–106)
Creatinine, Ser: 0.79 mg/dL (ref 0.57–1.00)
Globulin, Total: 2.7 g/dL (ref 1.5–4.5)
Glucose: 158 mg/dL — ABNORMAL HIGH (ref 70–99)
Potassium: 4.1 mmol/L (ref 3.5–5.2)
Sodium: 139 mmol/L (ref 134–144)
Total Protein: 7.1 g/dL (ref 6.0–8.5)
eGFR: 89 mL/min/{1.73_m2} (ref >59)

## 2022-09-16 LAB — CBC WITH DIFFERENTIAL/PLATELET
Basophils Absolute: 0 10*3/uL (ref 0.0–0.2)
Basos: 1 %
EOS (ABSOLUTE): 0.3 10*3/uL (ref 0.0–0.4)
Eos: 3 %
Hematocrit: 40.7 % (ref 34.0–46.6)
Hemoglobin: 13.5 g/dL (ref 11.1–15.9)
Immature Grans (Abs): 0 10*3/uL (ref 0.0–0.1)
Immature Granulocytes: 0 %
Lymphocytes Absolute: 1.7 10*3/uL (ref 0.7–3.1)
Lymphs: 20 %
MCH: 30.3 pg (ref 26.6–33.0)
MCHC: 33.2 g/dL (ref 31.5–35.7)
MCV: 91 fL (ref 79–97)
Monocytes Absolute: 0.6 10*3/uL (ref 0.1–0.9)
Monocytes: 7 %
Neutrophils Absolute: 5.7 10*3/uL (ref 1.4–7.0)
Neutrophils: 69 %
Platelets: 228 10*3/uL (ref 150–450)
RBC: 4.46 x10E6/uL (ref 3.77–5.28)
RDW: 13.1 % (ref 11.7–15.4)
WBC: 8.3 10*3/uL (ref 3.4–10.8)

## 2022-09-16 NOTE — Assessment & Plan Note (Addendum)
Patient endorses diarrhea, abdominal cramping, and hematochezia occurring intermittently for over a year but usually happen over 24 hour periods but this week has been ongoing for 3 days. Physical exam reassuring without abdominal tenderness. Colonoscopy in 2021 showed polyps and diverticula in left colon. She has known diverticulosis but symptoms potentially due to diverticulitis though reassuring that she has remained afebrile and without sharp abdominal pain only cramping during her diarrhea episodes. Less likely gastroenteritis given intermittent occurrences for the past year. Considered colorectal cancer, IBS, IBD. Will obtain CBC to check white count and hgb, CMP to check electrolytes given diarrhea and also elevated alk phos on last labs, and will refer to GI for further evaluation

## 2022-09-19 ENCOUNTER — Other Ambulatory Visit: Payer: Self-pay

## 2022-09-22 ENCOUNTER — Encounter: Payer: Self-pay | Admitting: Family Medicine

## 2022-09-22 ENCOUNTER — Ambulatory Visit (INDEPENDENT_AMBULATORY_CARE_PROVIDER_SITE_OTHER): Payer: Self-pay | Admitting: Family Medicine

## 2022-09-22 ENCOUNTER — Other Ambulatory Visit (HOSPITAL_COMMUNITY): Payer: Self-pay

## 2022-09-22 VITALS — BP 124/70 | HR 85 | Temp 97.9°F | Ht 62.0 in | Wt 211.6 lb

## 2022-09-22 DIAGNOSIS — J0110 Acute frontal sinusitis, unspecified: Secondary | ICD-10-CM

## 2022-09-22 MED ORDER — AMOXICILLIN-POT CLAVULANATE 875-125 MG PO TABS
1.0000 | ORAL_TABLET | Freq: Two times a day (BID) | ORAL | 0 refills | Status: AC
  Filled 2022-09-22: qty 14, 7d supply, fill #0

## 2022-09-22 MED ORDER — FLUCONAZOLE 150 MG PO TABS
150.0000 mg | ORAL_TABLET | Freq: Once | ORAL | 0 refills | Status: AC
  Filled 2022-09-22: qty 1, 1d supply, fill #0

## 2022-09-22 NOTE — Assessment & Plan Note (Signed)
Presentation consistent with acute bacterial rhinosinusitis.  Patient with history of the same.  Rx sent for 7 day course of Augmentin.  If no improvement, will need to follow-up with ENT due to her history of CSF rhinorrhea.

## 2022-09-22 NOTE — Patient Instructions (Signed)
It was great to meet you!  I have sent an antibiotic to your pharmacy for a sinus infection.  Take it twice daily for 7 days.  Take it with food. It can sometimes be tough on the stomach.  If your symptoms do not improve, please let us know.  Take care, Dr Rock Nephew

## 2022-09-22 NOTE — Progress Notes (Signed)
    SUBJECTIVE:   CHIEF COMPLAINT / HPI:   Headache -Patient reports 1.5 to 2 weeks of nasal congestion and headache -Few days ago her teeth started hurting -Feels mucus dripping down her throat, but not much coming out through nose -Headache is frontal -H/o migraines, this feels very different -Taking Sudafed, some relief but tries to avoid it due to HTN -H/o sinus infection in the past, feels similar -No fever, some chills last night (? 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  PERTINENT  PMH / PSH: HTN, HLD, migraine, prediabetes  OBJECTIVE:   BP 124/70   Pulse 85   Temp 97.9 F (36.6 C)   Ht '5\' 2"'$  (1.575 m)   Wt 211 lb 9.6 oz (96 kg)   LMP 03/02/2012   SpO2 98%   BMI 38.70 kg/m   Gen: NAD, pleasant, able to participate in exam HEENT: [place]ck/AT, PERRLA, nares patent bilaterally, TM normal bilaterally, oropharynx unremarkable, mild facial tenderness over maxillary and frontal sinuses Neck: supple, no cervical or supraclavicular lymphadenopathy CV: RRR, normal S1/S2, no murmur Resp: Normal effort, lungs CTAB Skin: warm and dry, no rashes noted Neuro: alert, CN II-XI I intact, equal strength and sensation in all extremities Psych: Normal affect and mood   ASSESSMENT/PLAN:   Acute sinusitis Presentation consistent with acute bacterial rhinosinusitis.  Patient with history of the same.  Rx sent for 7 day course of Augmentin.  If no improvement, will need to follow-up with ENT due to her history of CSF rhinorrhea.    Yeast Vaginitis Patient gets yeast infections with antibiotics. Rx sent for diflucan x1 to use as needed after completing course of Augmentin.  Alcus Dad, MD [place]

## 2022-09-26 ENCOUNTER — Ambulatory Visit (HOSPITAL_BASED_OUTPATIENT_CLINIC_OR_DEPARTMENT_OTHER): Payer: Self-pay | Admitting: Family

## 2022-10-03 ENCOUNTER — Ambulatory Visit (HOSPITAL_BASED_OUTPATIENT_CLINIC_OR_DEPARTMENT_OTHER): Payer: Self-pay | Admitting: Family

## 2022-10-10 ENCOUNTER — Other Ambulatory Visit: Payer: Self-pay

## 2022-10-10 ENCOUNTER — Other Ambulatory Visit (HOSPITAL_COMMUNITY): Payer: Self-pay

## 2022-10-11 ENCOUNTER — Other Ambulatory Visit: Payer: Self-pay

## 2022-10-16 ENCOUNTER — Ambulatory Visit (INDEPENDENT_AMBULATORY_CARE_PROVIDER_SITE_OTHER): Payer: Self-pay | Admitting: Family

## 2022-10-16 ENCOUNTER — Encounter (HOSPITAL_BASED_OUTPATIENT_CLINIC_OR_DEPARTMENT_OTHER): Payer: Self-pay | Admitting: Family

## 2022-10-16 VITALS — BP 132/88 | HR 60 | Ht 62.0 in | Wt 217.0 lb

## 2022-10-16 DIAGNOSIS — R0789 Other chest pain: Secondary | ICD-10-CM

## 2022-10-16 DIAGNOSIS — Z6839 Body mass index (BMI) 39.0-39.9, adult: Secondary | ICD-10-CM

## 2022-10-16 DIAGNOSIS — I10 Essential (primary) hypertension: Secondary | ICD-10-CM

## 2022-10-16 DIAGNOSIS — E782 Mixed hyperlipidemia: Secondary | ICD-10-CM

## 2022-10-16 NOTE — Progress Notes (Signed)
Office Visit    Patient Name: Deanna Scott Date of Encounter: 10/16/2022  PCP:  Orvis Brill, [place]  Cardiologist:  Skeet Latch, MD  Advanced Practice Provider:  No care team member to display Electrophysiologist:  None      Chief Complaint    Deanna Scott is a 51 y.o. 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   Qualifier: Diagnosis of  By: Dianah Field MD, Marcello Moores     Past Surgical History:  Procedure Laterality Date   ABDOMINAL HYSTERECTOMY     BLADDER SUSPENSION  2009   DIAGNOSTIC LAPAROSCOPY     ectopic pregnancy   DILATION AND CURETTAGE OF UTERUS     hx mab   endoscopy nasal sinus surgery  01/13/2020   [facility] - CFS Leak Repair   INTRAVASCULAR PRESSURE WIRE/FFR STUDY N/A 06/13/2022   Procedure: INTRAVASCULAR PRESSURE WIRE/FFR STUDY;  Surgeon: Belva Crome, MD;  Location: [facility];  Service: Cardiovascular;  Laterality: N/A;   LEFT HEART CATH AND CORONARY ANGIOGRAPHY N/A 06/13/2022   Procedure: LEFT HEART CATH AND CORONARY ANGIOGRAPHY;  Surgeon: Belva Crome, MD;  Location: [facility];  Service: Cardiovascular;  Laterality: N/A;   SACROSPINOUS LIGAMENT FIXATION     posterior repair wtih cysto   SVD     x 2   TUBAL LIGATION  1999   VAGINAL HYSTERECTOMY  03/13/2012   Procedure: HYSTERECTOMY VAGINAL;  Surgeon: Mora Bellman, MD;  Location: [facility];  Service: Gynecology;  Laterality: N/A;    Allergies  Allergies  Allergen Reactions   Latex Itching and  Dermatitis   Hydrocodone-Acetaminophen Other (See Comments)    CNS dysphoria   Oxycodone-Acetaminophen Other (See Comments)    CNS Dysphoria    History of Present Illness    Deanna Scott is a 51 y.o. female with a hx of chest pain (normal coronary arteries by cardiac cath 06/2022), GERD, HLD, tobacco use, obesity, migraine last seen 07/14/22 by Finis Bud, NP.  Underwent cardiac cath 06/2022 due to chest pain with angiographically near normal coronary arteries with minimal luminal irregularity. No evidence of microvascular dysfunction though acetylcholine challenge not performed.   Seen 07/14/22 noting recurrent chest discomfort which was atypical. Losartan stopped and Imdur initiated.   Presents today for overdue one month follow up. 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 She tells me she has a different pain now that feels like a pressure which she worries may be related to gas. She starts to burp about 30 minutes and feels better. No recent dietary changes nor increased carbonated beverages. She has blood pressure cuff at home but worries it is not accurate and has not been checking routinely. Notes occasional dizzy like a spinning feeling. No near syncope, syncope. She stopped taking cholesterol medication as her hip and knee started hurting. Pain resolved since discontinuing. Has been eating at home reducing cheese and  cholesterol containing foods.   EKGs/Labs/Other Studies Reviewed:   The following studies were reviewed today:   EKG:  EKG is not ordered today.    Recent Labs: 09/15/2022: ALT 8; BUN 17; Creatinine, Ser 0.79; Hemoglobin 13.5; Platelets 228; Potassium 4.1; Sodium 139  Recent Lipid Panel    Component Value Date/Time   CHOL 147 08/03/2022 0856   TRIG 126 08/03/2022 0856   HDL 45 08/03/2022 0856   CHOLHDL 3.3 08/03/2022 0856   CHOLHDL 5.2 08/18/2011 0855   VLDL 19 08/18/2011 0855   LDLCALC 80 08/03/2022 0856    Risk Assessment/Calculations:     Home  Medications   Current Meds  Medication Sig   acetaminophen (TYLENOL 8 HOUR) 650 MG CR tablet Take 2 tablets (1,300 mg total) by mouth every 8 (eight) hours as needed for pain.   albuterol (VENTOLIN HFA) 108 (90 Base) MCG/ACT inhaler Inhale 1 puff into the lungs every 4 (four) hours as needed for wheezing or shortness of breath.   aspirin-acetaminophen-caffeine (EXCEDRIN MIGRAINE) 250-250-65 MG per tablet Take 1 tablet by mouth every 6 (six) hours as needed for pain or migraine.   Blood Pressure KIT Check blood pressure 2-3 times week   diclofenac (VOLTAREN) 50 MG EC tablet Take 1 tablet (50 mg total) by mouth 2 (two) times daily.   fluticasone-salmeterol (ADVAIR HFA) 115-21 MCG/ACT inhaler Inhale 2 puffs into the lungs 2 (two) times daily.   isosorbide mononitrate (IMDUR) 30 MG 24 hr tablet Take 1/2 tablet (15 mg total) by mouth daily.   isosorbide mononitrate (IMDUR) 30 MG 24 hr tablet Take 0.5 tablets (15 mg total) by mouth daily.   loratadine (CLARITIN) 10 MG tablet Take 10 mg by mouth daily as needed for allergies.   Multiple Vitamin (MULTIVITAMIN ADULT PO) Take 1 tablet by mouth daily.   polyethylene glycol powder (GLYCOLAX/MIRALAX) 17 GM/SCOOP powder Take 17 g by mouth daily as needed for moderate constipation.   vitamin B-12 (CYANOCOBALAMIN) 500 MCG tablet Take 500 mcg by mouth 3 (three) times a week.   Current Facility-Administered Medications for the 10/16/22 encounter (Office Visit) with Loel Dubonnet, NP  Medication   sodium chloride flush (NS) 0.9 % injection 3 mL     Review of Systems      All other systems reviewed and are otherwise negative except as noted above.  Physical Exam    VS:  BP 132/88   Pulse 60   Ht _0  (1.575 m)   Wt 217 lb (98.4 kg)   LMP 03/02/2012   BMI 39.69 kg/m  , BMI Body mass index is 39.69 kg/m.  Wt Readings from Last 3 Encounters:  10/16/22 217 lb (98.4 kg)  09/22/22 211 lb 9.6 oz (96 kg)  09/15/22 213 lb 12.8 oz (97 kg)     GEN:  Well nourished, well developed, in no acute distress. HEENT: normal. Neck: Supple, no JVD, carotid bruits, or masses. Cardiac: RRR, no murmurs, rubs, or gallops. No clubbing, cyanosis, edema.  Radials/PT 2+ and equal bilaterally.  Respiratory:  Respirations regular and unlabored, clear to auscultation bilaterally. GI: Soft, nontender, nondistended. MS: No deformity or atrophy. Skin: Warm and dry, no rash. Neuro:  Strength and sensation are intact. Psych: Normal affect.  Assessment & Plan    HTN - BP not at goal <130/80. She is hesitant regarding medication changes. Discussed to monitor BP at home at least 2 hours after medications and sitting for 5-10 minutes. BP cuff provided in clinic. Phone call in  1 week to check in. If BP not at goal, increase Imdur to 8m daily.   Chest pain - atypical for angina as resolves by belching. [facility] disease. No ischemic eval recommended.   HLD - Stopped statin a month ago due ot myalgias. Direct LDL today. If lipid panel elevated, trial low dose Crestor three times per week.   Obesity - Weight loss via diet and exercise encouraged. Discussed the impact being overweight would have on cardiovascular risk.         Disposition: Follow up in 3-4 month(s) with TSkeet Latch MD or APP.  Signed, CLoel Dubonnet NP 10/16/2022, 9:29 PM CFowler

## 2022-10-16 NOTE — Patient Instructions (Addendum)
Medication Instructions:  Continue your current medications.   *If you need a refill on your cardiac medications before your next appointment, please call your pharmacy*   Lab Work: Your physician recommends that you return for lab work today: direct LDL  If you have labs (blood work) drawn today and your tests are completely normal, you will receive your results only by: [place] (if you have MyChart) OR A paper copy in the mail If you have any lab test that is abnormal or we need to change your treatment, we will call you to review the results.  Follow-Up: At [facility], you and your health needs are our priority.  As part of our continuing mission to provide you with exceptional heart care, we have created designated Provider Care Teams.  These Care Teams include your primary Cardiologist (physician) and Advanced Practice Providers (APPs -  Physician Assistants and Nurse Practitioners) who all work together to provide you with the care you need, when you need it.  We recommend signing up for the patient portal called "MyChart".  Sign up information is provided on this After Visit Summary.  MyChart is used to connect with patients for Virtual Visits (Telemedicine).  Patients are able to view lab/test results, encounter notes, upcoming appointments, etc.  Non-urgent messages can be sent to your provider as well.   To learn more about what you can do with MyChart, go to [email or web address].    Your next appointment:   3-4 month(s)  The format for your next appointment:   In Person  Provider:   Skeet Latch, MD or Laurann Montana, NP    Other Instructions  Tips to Measure your Blood Pressure Correctly   Here's what you can do to ensure a correct reading:  Don't drink a caffeinated beverage or smoke during the 30 minutes before the test.  Sit quietly for five minutes before the test begins.  During the measurement, sit in a chair with your feet on the floor  and your arm supported so your elbow is at about heart level.  The inflatable part of the cuff should completely cover at least 80% of your upper arm, and the cuff should be placed on bare skin, not over a shirt.  Don't talk during the measurement.   Blood pressure categories  Blood pressure category SYSTOLIC (upper number)  DIASTOLIC (lower number)  Normal Less than 120 mm Hg and Less than 80 mm Hg  Elevated 120-129 mm Hg and Less than 80 mm Hg  High blood pressure: Stage 1 hypertension 130-139 mm Hg or 80-89 mm Hg  High blood pressure: Stage 2 hypertension 140 mm Hg or higher or 90 mm Hg or higher  Hypertensive crisis (consult your doctor immediately) Higher than 180 mm Hg and/or Higher than 120 mm Hg  Source: American Heart Association and American Stroke Association. For more on getting your blood pressure under control, buy Controlling Your Blood Pressure, a Special Health Report from [facility].   Blood Pressure Log   Date   Time  Blood Pressure  Example: Nov 1 9 AM 124/78                                             Important Information About Sugar

## 2022-10-17 ENCOUNTER — Telehealth (HOSPITAL_BASED_OUTPATIENT_CLINIC_OR_DEPARTMENT_OTHER): Payer: Self-pay

## 2022-10-17 ENCOUNTER — Other Ambulatory Visit: Payer: Self-pay

## 2022-10-17 ENCOUNTER — Other Ambulatory Visit (HOSPITAL_COMMUNITY): Payer: Self-pay

## 2022-10-17 DIAGNOSIS — E782 Mixed hyperlipidemia: Secondary | ICD-10-CM

## 2022-10-17 LAB — LDL CHOLESTEROL, DIRECT: LDL Direct: 155 mg/dL — ABNORMAL HIGH (ref 0–99)

## 2022-10-17 MED ORDER — ROSUVASTATIN CALCIUM 10 MG PO TABS
10.0000 mg | ORAL_TABLET | ORAL | 3 refills | Status: DC
  Filled 2022-10-17: qty 30, 70d supply, fill #0
  Filled 2022-10-17: qty 39, 91d supply, fill #0
  Filled 2022-12-17: qty 30, 70d supply, fill #1

## 2022-10-17 NOTE — Telephone Encounter (Addendum)
Called results to patient and left results on VM (ok per DPR), instructions left to call office back if patient has any questions!     ----- Message from Loel Dubonnet, NP sent at 10/17/2022 10:16 AM EST ----- LDL (bad cholesterol) elevated. Recommend start Crestor '10mg'$  three times per week with repeat lipid panel, CMP in 2 months.

## 2022-10-18 ENCOUNTER — Other Ambulatory Visit: Payer: Self-pay

## 2022-10-23 ENCOUNTER — Telehealth (HOSPITAL_BASED_OUTPATIENT_CLINIC_OR_DEPARTMENT_OTHER): Payer: Self-pay

## 2022-10-23 NOTE — Telephone Encounter (Addendum)
Called patient, no answer, left message for patient to call back and give schedulers recent blood pressure log so we can get reviewed.    ----- Message from [name], RN sent at 10/16/2022  4:13 PM EST ----- Call to check on BP for CW

## 2022-10-24 NOTE — Telephone Encounter (Signed)
Left message for patient to call back  

## 2022-10-25 ENCOUNTER — Encounter (HOSPITAL_BASED_OUTPATIENT_CLINIC_OR_DEPARTMENT_OTHER): Payer: Self-pay

## 2022-10-25 NOTE — Telephone Encounter (Signed)
3rd call attempt, no answer, left message and mailed patient a letter asking her to call us.

## 2022-10-26 NOTE — Telephone Encounter (Signed)
Pt c/o BP issue: STAT if pt c/o blurred vision, one-sided weakness or slurred speech  1. What are your last 5 BP readings?  12/15: 129/86 12/19: 142/88 12/20: 136/99 12/21: 140/95  2. Are you having any other symptoms (ex. Dizziness, headache, blurred vision, passed out)?  Not having any symptoms, per patient  3. What is your BP issue?  Patient is returning call to report BP readings.

## 2022-10-26 NOTE — Telephone Encounter (Signed)
BP log as requested 

## 2022-10-27 ENCOUNTER — Telehealth: Payer: Self-pay | Admitting: Cardiovascular Disease

## 2022-10-27 ENCOUNTER — Other Ambulatory Visit (HOSPITAL_COMMUNITY): Payer: Self-pay

## 2022-10-27 MED ORDER — ISOSORBIDE MONONITRATE ER 30 MG PO TB24
30.0000 mg | ORAL_TABLET | Freq: Every day | ORAL | 3 refills | Status: DC
  Filled 2022-10-27: qty 90, 90d supply, fill #0

## 2022-10-27 NOTE — Telephone Encounter (Signed)
October 27, 2022 Deanna Dubonnet, NP  to [name], RN     10/27/22  7:59 AM Note Blood pressure not consistently at goal of less than 130/80.  Recommend increase Imdur to 30 mg daily.   Deanna Dubonnet, NP      See phone note 12/18  Advised patient, verbalized understanding  Updated Rx sent to pharmacy

## 2022-10-27 NOTE — Telephone Encounter (Signed)
Blood pressure not consistently at goal of less than 130/80.  Recommend increase Imdur to 30 mg daily.  Loel Dubonnet, NP

## 2022-10-27 NOTE — Telephone Encounter (Signed)
Advised patient, verbalized understanding  

## 2022-10-27 NOTE — Telephone Encounter (Signed)
Pt calling back to f/u on next step from BP logs. Please advise

## 2022-11-03 ENCOUNTER — Telehealth: Payer: Self-pay | Admitting: Cardiovascular Disease

## 2022-11-03 ENCOUNTER — Other Ambulatory Visit: Payer: Self-pay

## 2022-11-03 MED ORDER — ISOSORBIDE MONONITRATE ER 60 MG PO TB24
60.0000 mg | ORAL_TABLET | Freq: Every day | ORAL | 3 refills | Status: DC
  Filled 2022-11-03 – 2022-12-18 (×3): qty 90, 90d supply, fill #0

## 2022-11-03 NOTE — Telephone Encounter (Signed)
Imdur '30mg'$  added 12/18, continue with recent med change for one more week or new change? Please advise?

## 2022-11-03 NOTE — Telephone Encounter (Signed)
Pt c/o BP issue: STAT if pt c/o blurred vision, one-sided weakness or slurred speech  1. What are your last 5 BP readings?  130/100 - This morning 141/90 - This morning 2. Are you having any other symptoms (ex. Dizziness, headache, blurred vision, passed out)? No  3. What is your BP issue? Pt states that BP medication was changed but is still having elevated BP's. Please advise

## 2022-11-03 NOTE — Telephone Encounter (Signed)
Returned call to patient and reviewed the following recommendations. Patient is agreeable to new dose and is scheduled for 1/8 at 1pm for a nurse visit.    "Increase Imdur to '60mg'$  daily. Nurse visit in 1-2 weeks for BP recheck.    Loel Dubonnet, NP"

## 2022-11-03 NOTE — Telephone Encounter (Signed)
Increase Imdur to '60mg'$  daily. Nurse visit in 1-2 weeks for BP recheck.   Deanna Dubonnet, NP

## 2022-11-09 ENCOUNTER — Other Ambulatory Visit: Payer: Self-pay

## 2022-11-10 ENCOUNTER — Encounter (HOSPITAL_BASED_OUTPATIENT_CLINIC_OR_DEPARTMENT_OTHER): Payer: Self-pay

## 2022-11-10 ENCOUNTER — Ambulatory Visit (INDEPENDENT_AMBULATORY_CARE_PROVIDER_SITE_OTHER): Payer: Self-pay

## 2022-11-10 VITALS — BP 127/88 | HR 80 | Ht 62.0 in | Wt 212.5 lb

## 2022-11-10 DIAGNOSIS — I10 Essential (primary) hypertension: Secondary | ICD-10-CM

## 2022-11-10 NOTE — Progress Notes (Signed)
   Nurse Visit   Date of Encounter: 11/10/2022 ID: Deanna Scott, DOB 03/23/1971, MRN [id number]  PCP:  Orvis Brill, [place] [facility] Cardiologist:  Skeet Latch, MD      Visit Details   VS:  BP 127/88 Comment: home cuff  Pulse 80   Ht '5\' 2"'$  (1.575 m)   Wt 212 lb 8 oz (96.4 kg)   LMP 03/02/2012   BMI 38.87 kg/m  , BMI Body mass index is 38.87 kg/m.  Wt Readings from Last 3 Encounters:  11/10/22 212 lb 8 oz (96.4 kg)  10/16/22 217 lb (98.4 kg)  09/22/22 211 lb 9.6 oz (96 kg)     Reason for visit: Blood Pressure Check In  Performed today: Vitals, Provider consulted:Caitlin Gilford Rile, NP to review patient home blood pressures and in office pressure comparisons, and Education Changes (medications, testing, etc.) : No changes today, continue blood pressure medications and checking daily for the next week when patient gets home from work, RN will check in on patient next week to ensure blood pressure remaining at goal.  Length of Visit: 15 minutes    Medications Adjustments/Labs and Tests Ordered: No orders of the defined types were placed in this encounter.  No orders of the defined types were placed in this encounter.    Signed, [name], RN  11/10/2022 1:16 PM\

## 2022-11-10 NOTE — Patient Instructions (Signed)
Medication Instructions:  Your Physician recommend you continue on your current medication as directed.    *If you need a refill on your cardiac medications before your next appointment, please call your pharmacy*   Follow-Up: At [facility], you and your health needs are our priority.  As part of our continuing mission to provide you with exceptional heart care, we have created designated Provider Care Teams.  These Care Teams include your primary Cardiologist (physician) and Advanced Practice Providers (APPs -  Physician Assistants and Nurse Practitioners) who all work together to provide you with the care you need, when you need it.  We recommend signing up for the patient portal called "MyChart".  Sign up information is provided on this After Visit Summary.  MyChart is used to connect with patients for Virtual Visits (Telemedicine).  Patients are able to view lab/test results, encounter notes, upcoming appointments, etc.  Non-urgent messages can be sent to your provider as well.   To learn more about what you can do with MyChart, go to [email or web address].    Your next appointment:   Follow up as scheduled   Other Instructions Continue taking your medications, if easier to check blood pressure once you get to work or even when you get home from work. Deanna Scott will check in on you in about a week or so to see how blood pressures are doing!

## 2022-11-11 IMAGING — RF DG SPINAL PUNCT LUMBAR DIAG WITH FL CT GUIDANCE
2 series · 2 of 2 positions shown · non-contrast
Comparison: None

CLINICAL DATA: Patient with a history of migraines and suspected
intracranial hypertension. Radiology asked to perform an
image-guided lumbar puncture to check pressures and obtain fluid
sample for further evaluation.

EXAM:
DIAGNOSTIC LUMBAR PUNCTURE UNDER FLUOROSCOPIC GUIDANCE

[Series 1: fluoro_iodine 2fps_bw · 0.18mm/px · 1 of 1 slices shown]
[im 1/1]
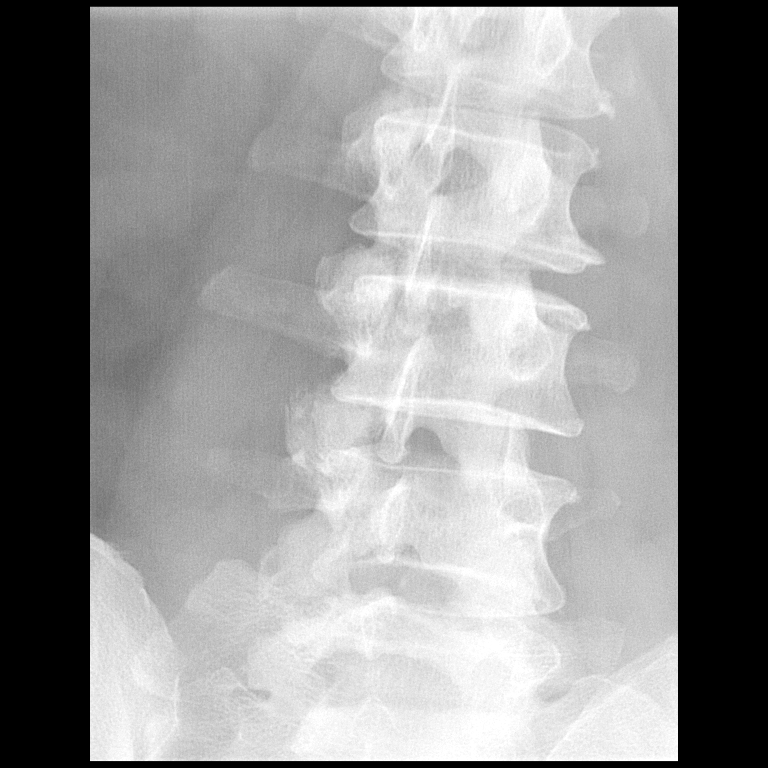

[Series 2: cp_standard · 0.17mm/px · 1 of 1 slices shown]
[im 1/1]
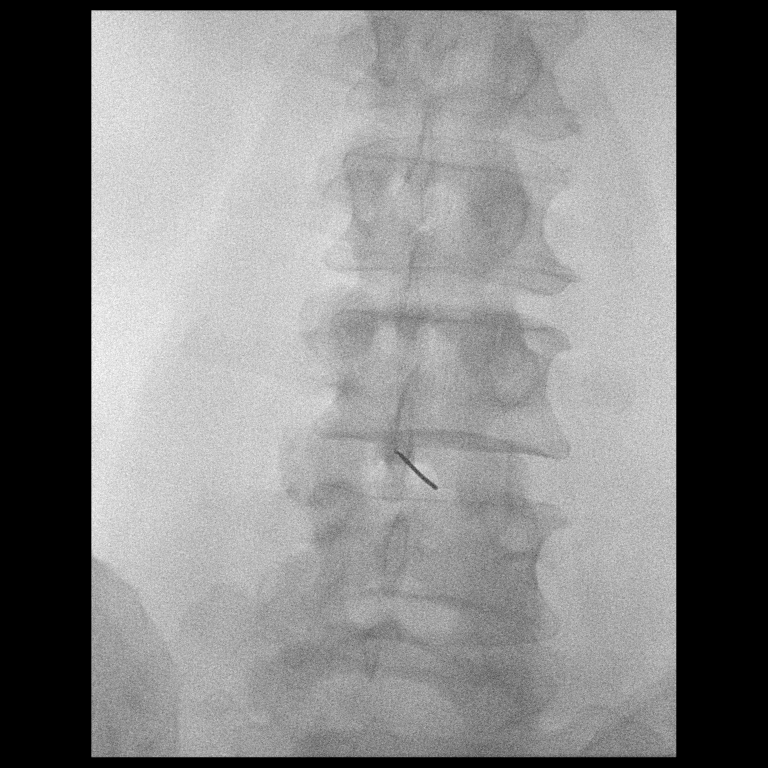

[2 of 2 positions shown; findings below may reference images not displayed]

FLUOROSCOPY TIME:  Fluoroscopy Time:  54 seconds

Radiation Exposure Index (if provided by the fluoroscopic device):
10.3 mGy

Number of Acquired Spot Images: 2

PROCEDURE:
Informed consent was obtained from the patient prior to the
procedure, including potential complications of headache, allergy,
and pain. With the patient prone, the lower back was prepped with
Betadine. 1% Lidocaine was used for local anesthesia. Lumbar
puncture was performed at the L3-L4 level using a 20 gauge needle
with return of clear CSF with an opening pressure of 22 cm water.
Nine ml of CSF were obtained for laboratory studies. The patient
tolerated the procedure well and there were no apparent
complications.
IMPRESSION: Technically successful image guided lumbar puncture. Read by: Chapman
Rantona, NP

## 2022-11-13 ENCOUNTER — Ambulatory Visit (HOSPITAL_BASED_OUTPATIENT_CLINIC_OR_DEPARTMENT_OTHER): Payer: No Typology Code available for payment source

## 2022-11-16 ENCOUNTER — Telehealth (HOSPITAL_BASED_OUTPATIENT_CLINIC_OR_DEPARTMENT_OTHER): Payer: Self-pay

## 2022-11-16 NOTE — Telephone Encounter (Addendum)
Called patient to check in on blood pressures.   128/70s consistently since nurse visit, patient endorses feeling much better. Advised patient no changes at this time but to call if pressure started going back up again.    ----- Message from [name], RN sent at 11/10/2022  1:13 PM EST ----- BP for CW

## 2022-11-16 NOTE — Telephone Encounter (Signed)
Blood pressures sound great. Thanks for checking in on her!  Loel Dubonnet, NP

## 2022-12-18 ENCOUNTER — Other Ambulatory Visit: Payer: Self-pay

## 2022-12-18 ENCOUNTER — Telehealth: Payer: Self-pay | Admitting: Cardiovascular Disease

## 2022-12-18 ENCOUNTER — Other Ambulatory Visit (HOSPITAL_COMMUNITY): Payer: Self-pay

## 2022-12-18 MED ORDER — ISOSORBIDE MONONITRATE ER 60 MG PO TB24
60.0000 mg | ORAL_TABLET | Freq: Every day | ORAL | 0 refills | Status: DC
  Filled 2022-12-18 – 2022-12-19 (×2): qty 90, 90d supply, fill #0

## 2022-12-18 NOTE — Telephone Encounter (Signed)
*  STAT* If patient is at the pharmacy, call can be transferred to refill team.   1. Which medications need to be refilled? (please list name of each medication and dose if known)  isosorbide mononitrate (IMDUR) 60 MG 24 hr tablet   2. Which pharmacy/location (including street and city if local pharmacy) is medication to be sent to? [place]   3. Do they need a 30 day or 90 day supply? 90 day  Patient is completely out of medication. She has an appointment 01/15/2023.

## 2022-12-18 NOTE — Telephone Encounter (Signed)
Rx request sent to pharmacy.  

## 2022-12-19 ENCOUNTER — Other Ambulatory Visit (HOSPITAL_COMMUNITY): Payer: Self-pay

## 2022-12-19 ENCOUNTER — Other Ambulatory Visit: Payer: Self-pay

## 2023-01-08 ENCOUNTER — Telehealth (HOSPITAL_BASED_OUTPATIENT_CLINIC_OR_DEPARTMENT_OTHER): Payer: Self-pay | Admitting: Family

## 2023-01-08 LAB — COMPREHENSIVE METABOLIC PANEL
ALT: 6 IU/L (ref 0–32)
AST: 14 IU/L (ref 0–40)
Albumin/Globulin Ratio: 1.7 (ref 1.2–2.2)
Albumin: 4.4 g/dL (ref 3.8–4.9)
Alkaline Phosphatase: 126 IU/L — ABNORMAL HIGH (ref 44–121)
BUN/Creatinine Ratio: 21 (ref 9–23)
BUN: 15 mg/dL (ref 6–24)
Bilirubin Total: 0.3 mg/dL (ref 0.0–1.2)
CO2: 21 mmol/L (ref 20–29)
Calcium: 9.2 mg/dL (ref 8.7–10.2)
Chloride: 103 mmol/L (ref 96–106)
Creatinine, Ser: 0.71 mg/dL (ref 0.57–1.00)
Globulin, Total: 2.6 g/dL (ref 1.5–4.5)
Glucose: 121 mg/dL — ABNORMAL HIGH (ref 70–99)
Potassium: 4.4 mmol/L (ref 3.5–5.2)
Sodium: 139 mmol/L (ref 134–144)
Total Protein: 7 g/dL (ref 6.0–8.5)
eGFR: 102 mL/min/{1.73_m2} (ref >59)

## 2023-01-08 LAB — LIPID PANEL
Chol/HDL Ratio: 4.1 ratio (ref 0.0–4.4)
Cholesterol, Total: 167 mg/dL (ref 100–199)
HDL: 41 mg/dL (ref >39)
LDL Chol Calc (NIH): 99 mg/dL (ref 0–99)
Triglycerides: 154 mg/dL — ABNORMAL HIGH (ref 0–149)
VLDL Cholesterol Cal: 27 mg/dL (ref 5–40)

## 2023-01-08 NOTE — Telephone Encounter (Signed)
Left message for patient to call and reschedule the 01/15/23 3:35 pm appt with Laurann Montana, NP--provider not in the office

## 2023-01-09 ENCOUNTER — Telehealth (HOSPITAL_BASED_OUTPATIENT_CLINIC_OR_DEPARTMENT_OTHER): Payer: Self-pay

## 2023-01-09 NOTE — Telephone Encounter (Addendum)
Results called to patient who verbalizes understanding!     ----- Message from Loel Dubonnet, NP sent at 01/09/2023  7:57 AM EST ----- LDL (bad cholesterol)  improved from 155 to 99.  Good result!  Continue rosuvastatin 10 mg 3 times per week.  Normal kidney function and electrolytes.  Stable liver enzymes.

## 2023-01-10 ENCOUNTER — Telehealth: Payer: Self-pay | Admitting: Licensed Clinical Social Worker

## 2023-01-10 NOTE — Telephone Encounter (Signed)
H&V Care Navigation CSW Progress Note  Clinical Social Worker contacted patient by phone to f/u on [employer]  renewal. Was scheduled to have appt on 3/11, this has been cancelled due to provider schedule. Pt was reached at [phone]. Re-introduced self, role, reason for call. Pt shares she has been enrolled in insurance which is free to her since she is low income. She shares it is [employer] and reads off to me that it is an [employer]. I share that her PCP and [facility] are [place] providers and she should reach out to plan to better understand her coverage to make sure she can see [facility] providers/understand any costs. She will do so. LCSW has also shared w/ pt that Zigmund Daniel, patient access, has tried to reach her to reschedule appt and I provided pt with number to reach out and get rescheduled. No additional questions/concerns at this time- encouraged pt to let me know if any needs arise.   Patient is participating in a Managed Medicaid Plan:  No, [employer] commercial plan only.  SDOH Screenings   Food Insecurity: Food Insecurity Present (01/10/2023)  Housing: Low Risk  (01/10/2023)  Transportation Needs: No Transportation Needs (01/10/2023)  Utilities: Not At Risk (01/10/2023)  Depression (PHQ2-9): Low Risk  (09/22/2022)  Financial Resource Strain: Medium Risk (01/10/2023)  Tobacco Use: Medium Risk (11/10/2022)   Westley Hummer, MSW, [place]  [phone]- work cell phone (preferred) [phone]- desk phone

## 2023-01-10 NOTE — Telephone Encounter (Signed)
Additional phone note opened in error.   Westley Hummer, MSW, [place]  [phone]- work cell phone (preferred) [phone]- desk phone

## 2023-01-10 NOTE — Telephone Encounter (Signed)
Left message for patient to call and reschedule the 01/15/23 3:35 pm appointment with Laurann Montana, NP (provider is not in the office

## 2023-01-15 ENCOUNTER — Ambulatory Visit (HOSPITAL_BASED_OUTPATIENT_CLINIC_OR_DEPARTMENT_OTHER): Payer: No Typology Code available for payment source | Admitting: Family

## 2023-02-12 ENCOUNTER — Ambulatory Visit (INDEPENDENT_AMBULATORY_CARE_PROVIDER_SITE_OTHER): Payer: No Typology Code available for payment source | Admitting: Family Medicine

## 2023-02-12 ENCOUNTER — Other Ambulatory Visit: Payer: Self-pay

## 2023-02-12 ENCOUNTER — Encounter: Payer: Self-pay | Admitting: Family Medicine

## 2023-02-12 VITALS — BP 114/77 | HR 65 | Ht 62.0 in | Wt 212.2 lb

## 2023-02-12 DIAGNOSIS — K219 Gastro-esophageal reflux disease without esophagitis: Secondary | ICD-10-CM | POA: Diagnosis not present

## 2023-02-12 DIAGNOSIS — E785 Hyperlipidemia, unspecified: Secondary | ICD-10-CM

## 2023-02-12 DIAGNOSIS — M545 Low back pain, unspecified: Secondary | ICD-10-CM

## 2023-02-12 MED ORDER — ISOSORBIDE MONONITRATE ER 60 MG PO TB24
60.0000 mg | ORAL_TABLET | Freq: Every day | ORAL | 0 refills | Status: DC
  Filled 2023-02-12 – 2023-03-22 (×2): qty 90, 90d supply, fill #0

## 2023-02-12 MED ORDER — PANTOPRAZOLE SODIUM 40 MG PO TBEC
40.0000 mg | DELAYED_RELEASE_TABLET | Freq: Every day | ORAL | 0 refills | Status: DC
  Filled 2023-02-12 – 2023-02-13 (×2): qty 90, 90d supply, fill #0

## 2023-02-12 NOTE — Patient Instructions (Addendum)
It was great to see you!  We will start a medication called pantoprazole or Protonix for acid reflux.  Take this once daily.  Stop taking any over-the-counter medications for reflux.  We will follow-up in 6 weeks to ensure you are improving.  I highly recommend continuing your cholesterol medication.  However, if you choose not to do so, we should recheck your cholesterol in 3 months.  With regard to your right sided pain, this is likely a muscle strain and should get better with time. Try to avoid repetitive twisting, bending, lifting until you're healed.  You can use a heating pad and Tylenol or ibuprofen as needed over the next several days.    Take care, Dr. Anner Crete

## 2023-02-12 NOTE — Assessment & Plan Note (Signed)
Presentation consistent with GERD. No red flags/alarm features. Previously on pantoprazole although has been off for at least a few years. Trial Protonix 40mg  daily.  Follow up in 6 weeks.

## 2023-02-12 NOTE — Progress Notes (Signed)
    SUBJECTIVE:   CHIEF COMPLAINT / HPI:   Reflux -patient states she's been having "a lot of gas" -for the past few weeks -states she has "gas all day long, wakes up with it, goes to sleep with it" -felt like gas was in her chest sometimes -stopped cholesterol medication 1 week ago because she thought that was contributing -symptoms improving but still present, belching a lot -taking OTC reflux meds (unsure of the name) occasionally but not consistently -no dysphagia, no weight loss, no vomiting  R Side/Back Pain -located in right lateral low back -for the past ~4 days -no injury or trauma -works as a Engineer, water, does a lot of twisting, bending, some lifting -notices the pain mainly with certain movements, sometimes worse when laying on it -tried tylenol which provided relief -wants to make sure it's not related to "her organs" -no changes in urination or BM -no radiation into legs, no weakness or numbness   Needs refill on Imdur 60mg  daily  PERTINENT  PMH / PSH: obesity, prediabetes, HTN, HLD  OBJECTIVE:   BP 114/77   Pulse 65   Ht 5\' 2"  (1.575 m)   Wt 212 lb 3.2 oz (96.3 kg)   LMP 03/02/2012   SpO2 98%   BMI 38.81 kg/m   Gen: NAD, able to participate in exam HEENT: normal sclera and conjunctiva, oropharynx unremarkable Neck: supple CV: RRR, normal S1/S2, no murmur Resp: Normal effort, lungs CTAB GI: abdomen soft, NTND Extremities: no edema or cyanosis Skin: warm and dry, no rashes noted Neuro: alert, no obvious focal deficits Back: no skin changes, no obvious deformity. No midline TTP. Minimal TTP right lateral flank region. Full ROM with flexion, extension, rotation, and lateral bending. Negative SLR bilaterally.  Strength and sensation to light touch intact in lower extremities.   ASSESSMENT/PLAN:   GERD (gastroesophageal reflux disease) Presentation consistent with GERD. No red flags/alarm features. Previously on pantoprazole although has been off for at  least a few years. Trial Protonix 40mg  daily.  Follow up in 6 weeks.   Hyperlipidemia Patient self d/c'd her rosuvastatin (which she was taking three times weekly per cardiology). Doubt rosuvastatin is contributing significantly to her GERD symptoms, therefore advised she re-start but patient adamantly declines. Recommend repeat lipid panel in 3 months.  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 No red flags on history or exam. Advised Tylenol/NSAIDs, supportive care and avoidance of repetitive lifting/twisting/bending. Return if no improvement.     Maury Dus, MD [facility]

## 2023-02-12 NOTE — Assessment & Plan Note (Addendum)
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 No red flags on history or exam. Advised Tylenol/NSAIDs, supportive care and avoidance of repetitive lifting/twisting/bending. Return if no improvement.

## 2023-02-12 NOTE — Assessment & Plan Note (Signed)
Patient self d/c'd her rosuvastatin (which she was taking three times weekly per cardiology). Doubt rosuvastatin is contributing significantly to her GERD symptoms, therefore advised she re-start but patient adamantly declines. Recommend repeat lipid panel in 3 months.

## 2023-02-13 ENCOUNTER — Other Ambulatory Visit (HOSPITAL_COMMUNITY): Payer: Self-pay

## 2023-02-14 ENCOUNTER — Other Ambulatory Visit: Payer: Self-pay

## 2023-03-22 ENCOUNTER — Other Ambulatory Visit (HOSPITAL_COMMUNITY): Payer: Self-pay

## 2023-04-25 ENCOUNTER — Other Ambulatory Visit (HOSPITAL_COMMUNITY): Payer: Self-pay

## 2023-04-25 ENCOUNTER — Ambulatory Visit
Admission: EM | Admit: 2023-04-25 | Discharge: 2023-04-25 | Disposition: A | Discharge status: Home / Self Care | Payer: No Typology Code available for payment source | Attending: Nurse Practitioner | Admitting: Nurse Practitioner

## 2023-04-25 DIAGNOSIS — H66002 Acute suppurative otitis media without spontaneous rupture of ear drum, left ear: Secondary | ICD-10-CM | POA: Diagnosis not present

## 2023-04-25 DIAGNOSIS — R42 Dizziness and giddiness: Secondary | ICD-10-CM

## 2023-04-25 DIAGNOSIS — J329 Chronic sinusitis, unspecified: Secondary | ICD-10-CM

## 2023-04-25 DIAGNOSIS — B9689 Other specified bacterial agents as the cause of diseases classified elsewhere: Secondary | ICD-10-CM

## 2023-04-25 DIAGNOSIS — R110 Nausea: Secondary | ICD-10-CM

## 2023-04-25 DIAGNOSIS — Z8742 Personal history of other diseases of the female genital tract: Secondary | ICD-10-CM

## 2023-04-25 MED ORDER — MECLIZINE HCL 25 MG PO TABS
25.0000 mg | ORAL_TABLET | Freq: Three times a day (TID) | ORAL | 0 refills | Status: DC | PRN
  Filled 2023-04-25: qty 10, 4d supply, fill #0

## 2023-04-25 MED ORDER — ONDANSETRON 4 MG PO TBDP
4.0000 mg | ORAL_TABLET | Freq: Three times a day (TID) | ORAL | 0 refills | Status: DC | PRN
  Filled 2023-04-25: qty 10, 4d supply, fill #0

## 2023-04-25 MED ORDER — FLUTICASONE PROPIONATE 50 MCG/ACT NA SUSP
1.0000 | Freq: Every day | NASAL | 0 refills | Status: DC
  Filled 2023-04-25: qty 16, 60d supply, fill #0

## 2023-04-25 MED ORDER — FLUCONAZOLE 150 MG PO TABS
150.0000 mg | ORAL_TABLET | Freq: Every day | ORAL | 0 refills | Status: DC
  Filled 2023-04-25: qty 1, 1d supply, fill #0

## 2023-04-25 MED ORDER — AMOXICILLIN 875 MG PO TABS
875.0000 mg | ORAL_TABLET | Freq: Two times a day (BID) | ORAL | 0 refills | Status: AC
  Filled 2023-04-25: qty 20, 10d supply, fill #0

## 2023-04-25 MED ORDER — ONDANSETRON 4 MG PO TBDP
4.0000 mg | ORAL_TABLET | Freq: Once | ORAL | Status: AC
  Administered 2023-04-25: 4 mg via ORAL

## 2023-04-25 NOTE — ED Triage Notes (Signed)
Pt presents to UC w/ c/o dizziness x1 week on and off, sinus drainage, nausea. Hx vertigo Sudafed helped some Pt states it was worse last night when she got up to use the bathroom and felt the room spinning.  Unable to eat or drink today d/t nausea. Pt c/o intermittent left ear pain since yesterday.

## 2023-04-25 NOTE — Discharge Instructions (Signed)
Start amoxicillin daily for 10 days to treat your sinus infection as well as your ear infection You may take Zofran every 8 hours as needed for nausea Use Flonase daily to help with your sinus pressure and for sinus drainage Meclizine as needed for vertigo/dizziness.  Please note this medication can make you drowsy.  Do not drink alcohol or drive while you are on this medication Stay hydrated and rest Please follow-up with your PCP if your symptoms do not improve Please go to emergency room if you develop any worsening symptoms Hope you feel better soon!

## 2023-04-25 NOTE — ED Provider Notes (Signed)
[facility]    CSN: [id number] Arrival date & time: 04/25/23  4098      History   Chief Complaint Chief Complaint  Patient presents with   Dizziness    HPI Deanna Scott is a 52 y.o. female  presents for evaluation of URI symptoms for 7 days. Patient reports associated symptoms of sinus pressure/pain, ear pain, dizziness, nausea. Denies vomiting, diarrhea, fevers, cough, body aches, syncope, shortness of breath. Patient does not have a hx of asthma or smoking. No known sick contacts.  Patient does have a history of vertigo and states her dizziness is similar to her previous vertigo symptoms.  Denies headache.  Pt has taken Sudafed OTC for symptoms. Pt has no other concerns at this time.    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   Qualifier: Diagnosis of  By: Benjamin Stain MD, Maisie Fus      Patient Active Problem List   Diagnosis Date Noted   Bloody diarrhea 09/16/2022   Right foot pain 08/21/2022   Elevated alkaline phosphatase level 08/11/2022   Breast pain, left 06/09/2022   Angina pectoris (HCC) 06/08/2022   Environmental allergies 06/08/2022   Prediabetes 10/05/2021   Dyslipidemia 10/05/2021   HTN (hypertension) 09/19/2021   Hyperlipidemia 09/06/2021   Headache 02/01/2021   BV (bacterial vaginosis) 11/11/2020   Snoring 06/08/2020   Pinched nerve 06/07/2020   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   Extremity numbness 10/24/2019   Shortness of breath 10/23/2019   Lateral epicondylitis 10/08/2019   Fatigue 10/08/2019   Screening breast examination 10/07/2019   Triceps tendonitis  07/21/2019   Low back pain 06/18/2019   Acute sinusitis 10/18/2018   Benign paroxysmal positional vertigo 10/18/2018   Vaginal wall prolapse 07/29/2017   Migraines 07/27/2017   GERD (gastroesophageal reflux disease) 07/29/2014   OBESITY, NOS 01/03/2007    Past Surgical History:  Procedure Laterality Date   ABDOMINAL HYSTERECTOMY     BLADDER SUSPENSION  2009   CORONARY PRESSURE/FFR STUDY N/A 06/13/2022   Procedure: INTRAVASCULAR PRESSURE WIRE/FFR STUDY;  Surgeon: Lyn Records, MD;  Location: [facility];  Service: Cardiovascular;  Laterality: N/A;   DIAGNOSTIC LAPAROSCOPY     ectopic pregnancy   DILATION AND CURETTAGE OF UTERUS     hx mab   endoscopy nasal sinus surgery  01/13/2020   [facility] - CFS Leak Repair   LEFT HEART CATH AND CORONARY ANGIOGRAPHY N/A 06/13/2022   Procedure: LEFT HEART CATH AND CORONARY ANGIOGRAPHY;  Surgeon: Lyn Records, MD;  Location: [facility];  Service: Cardiovascular;  Laterality: N/A;   SACROSPINOUS LIGAMENT FIXATION     posterior repair wtih cysto   SVD     x 2   TUBAL LIGATION  1999   VAGINAL HYSTERECTOMY  03/13/2012   Procedure: HYSTERECTOMY VAGINAL;  Surgeon: Catalina Antigua, MD;  Location: [facility];  Service: Gynecology;  Laterality: N/A;    OB History     Gravida  4   Para  2  Term  2   Preterm      AB  2   Living  2      SAB  1   IAB      Ectopic  1   Multiple      Live Births               Home Medications    Prior to Admission medications   Medication Sig Start Date End Date Taking? Authorizing Provider  amoxicillin (AMOXIL) 875 MG tablet Take 1 tablet (875 mg total) by mouth 2 (two) times daily for 10 days. 04/25/23 05/05/23 Yes Radford Pax, NP  fluconazole (DIFLUCAN) 150 MG tablet Take 1 tablet (150 mg total) by mouth daily. 04/25/23  Yes Radford Pax, NP  fluticasone (FLONASE) 50 MCG/ACT nasal spray Place 1 spray into both nostrils daily. 04/25/23  Yes Radford Pax, NP  meclizine (ANTIVERT) 25  MG tablet Take 1 tablet (25 mg total) by mouth 3 (three) times daily as needed for dizziness. 04/25/23  Yes Radford Pax, NP  ondansetron (ZOFRAN-ODT) 4 MG disintegrating tablet Take 1 tablet (4 mg total) by mouth every 8 (eight) hours as needed for nausea or vomiting. 04/25/23  Yes Radford Pax, NP  acetaminophen (TYLENOL 8 HOUR) 650 MG CR tablet Take 2 tablets (1,300 mg total) by mouth every 8 (eight) hours as needed for pain. 06/16/19   Arlyce Harman, MD  albuterol (VENTOLIN HFA) 108 (90 Base) MCG/ACT inhaler Inhale 1 puff into the lungs every 4 (four) hours as needed for wheezing or shortness of breath. 09/19/21   Simmons-Robinson, Tawanna Cooler, MD  aspirin-acetaminophen-caffeine (EXCEDRIN MIGRAINE) [phone] MG per tablet Take 1 tablet by mouth every 6 (six) hours as needed for pain or migraine.    [provider]  Blood Pressure KIT Check blood pressure 2-3 times week 09/05/21   Dana Allan, MD  diclofenac (VOLTAREN) 50 MG EC tablet Take 1 tablet (50 mg total) by mouth 2 (two) times daily. 03/13/22   Vallery Sa, Amy L, PA  fluticasone-salmeterol (ADVAIR HFA) 782-95 MCG/ACT inhaler Inhale 2 puffs into the lungs 2 (two) times daily. 09/19/21   Simmons-Robinson, Tawanna Cooler, MD  isosorbide mononitrate (IMDUR) 60 MG 24 hr tablet Take 1 tablet (60 mg total) by mouth daily. 02/12/23 06/20/23  Maury Dus, MD  loratadine (CLARITIN) 10 MG tablet Take 10 mg by mouth daily as needed for allergies.    [provider]  Multiple Vitamin (MULTIVITAMIN ADULT PO) Take 1 tablet by mouth daily.    [provider]  nitroGLYCERIN (NITROSTAT) 0.4 MG SL tablet Place 1 tablet (0.4 mg total) under the tongue every 5 (five) minutes as needed for chest pain. 06/09/22 09/07/22  Chilton Si, MD  pantoprazole (PROTONIX) 40 MG tablet Take 1 tablet (40 mg total) by mouth daily. 02/12/23   Maury Dus, MD  polyethylene glycol powder (GLYCOLAX/MIRALAX) 17 GM/SCOOP powder Take 17 g by mouth daily as needed  for moderate constipation. 01/15/20   [provider]  rosuvastatin (CRESTOR) 10 MG tablet Take 1 tablet (10 mg total) by mouth 3 (three) times a week. Patient not taking: Reported on 02/12/2023 10/18/22 10/17/23  Alver Sorrow, NP  vitamin B-12 (CYANOCOBALAMIN) 500 MCG tablet Take 500 mcg by mouth 3 (three) times a week.    [provider]    Family History Family History  Problem Relation Age of Onset   Other Mother        balance issues from a "thing in  her brain" requires walker   Colon cancer Mother 24       dx 2019   Cancer Father    Heart disease Maternal Grandmother    Diabetes Maternal Grandmother    Rectal cancer Neg Hx    Stomach cancer Neg Hx     Social History Social History   Tobacco Use   Smoking status: Former    Packs/day: 1.00    Years: 34.00    Additional pack years: 0.00    Total pack years: 34.00    Types: Cigarettes    Quit date: 09/04/2015    Years since quitting: 7.6   Smokeless tobacco: Never   Tobacco comments:    Started age 28 - quit age 77  Vaping Use   Vaping Use: Former  Substance Use Topics   Alcohol use: Yes    Alcohol/week: 2.0 standard drinks of alcohol    Types: 2 Standard drinks or equivalent per week    Comment: socially   Drug use: No     Allergies   Latex, Hydrocodone-acetaminophen, and Oxycodone-acetaminophen   Review of Systems Review of Systems  HENT:  Positive for congestion, postnasal drip, sinus pressure and sinus pain.   Gastrointestinal:  Positive for nausea.  Neurological:  Positive for dizziness.     Physical Exam Triage Vital Signs ED Triage Vitals  Enc Vitals Group     BP 04/25/23 1047 131/72     Pulse Rate 04/25/23 1047 (!) 58     Resp 04/25/23 1047 16     Temp 04/25/23 1047 97.6 F (36.4 C)     Temp Source 04/25/23 1047 Oral     SpO2 04/25/23 1047 97 %     Weight --      Height --      Head Circumference --      Peak Flow --      Pain Score 04/25/23 1050 1     Pain Loc --       Pain Edu? --      Excl. in GC? --    No data found.  Updated Vital Signs BP 131/72 (BP Location: Right Arm)   Pulse (!) 58   Temp 97.6 F (36.4 C) (Oral)   Resp 16   LMP 03/02/2012   SpO2 97%   Visual Acuity Right Eye Distance:   Left Eye Distance:   Bilateral Distance:    Right Eye Near:   Left Eye Near:    Bilateral Near:     Physical Exam Vitals and nursing note reviewed.  Constitutional:      General: She is not in acute distress.    Appearance: She is well-developed. She is not ill-appearing.  HENT:     Head: Normocephalic and atraumatic.     Right Ear: Tympanic membrane and ear canal normal.     Left Ear: Ear canal normal. A middle ear effusion is present. Tympanic membrane is erythematous.     Nose: Congestion present.     Right Turbinates: Swollen and pale.     Left Turbinates: Swollen and pale.     Right Sinus: Maxillary sinus tenderness present. No frontal sinus tenderness.     Left Sinus: Maxillary sinus tenderness present. No frontal sinus tenderness.     Mouth/Throat:     Mouth: Mucous membranes are moist.     Pharynx: Oropharynx is clear. Uvula midline. No posterior oropharyngeal erythema.     Tonsils: No tonsillar exudate or tonsillar abscesses.  Eyes:  Conjunctiva/sclera: Conjunctivae normal.     Pupils: Pupils are equal, round, and reactive to light.  Cardiovascular:     Rate and Rhythm: Normal rate and regular rhythm.     Heart sounds: Normal heart sounds.  Pulmonary:     Effort: Pulmonary effort is normal.     Breath sounds: Normal breath sounds.  Musculoskeletal:     Cervical back: Normal range of motion and neck supple.  Lymphadenopathy:     Cervical: No cervical adenopathy.  Skin:    General: Skin is warm and dry.  Neurological:     General: No focal deficit present.     Mental Status: She is alert and oriented to person, place, and time.     GCS: GCS eye subscore is 4. GCS verbal subscore is 5. GCS motor subscore is 6.      Cranial Nerves: No facial asymmetry.     Motor: No weakness.     Coordination: Romberg sign negative. Finger-Nose-Finger Test normal.  Psychiatric:        Mood and Affect: Mood normal.        Behavior: Behavior normal.      UC Treatments / Results  Labs (all labs ordered are listed, but only abnormal results are displayed) Labs Reviewed - No data to display  EKG   Radiology No results found.  Procedures Procedures (including critical care time)  Medications Ordered in UC Medications  ondansetron (ZOFRAN-ODT) disintegrating tablet 4 mg (4 mg Oral Given 04/25/23 1116)    Initial Impression / Assessment and Plan / UC Course  I have reviewed the triage vital signs and the nursing notes.  Pertinent labs & imaging results that were available during my care of the patient were reviewed by me and considered in my medical decision making (see chart for details).     Reviewed exam and symptoms with patient.  No red flags.  Discussed sinusitis/vertigo as well as left OM Start amoxicillin for otitis media and sinusitis.  Patient reports antibiotic due to yeast infections, Diflucan sent to pharmacy.  Start Flonase daily.  Nasal rinses as tolerated Zofran given in clinic for nausea and Rx sent to pharmacy to use. Meclizine as needed for vertigo symptoms.  Side effect profile reviewed. Advised rest and fluids PCP follow-up if symptoms do not improve ER precautions reviewed and patient verbalized understanding Final Clinical Impressions(s) / UC Diagnoses   Final diagnoses:  Nausea  Bacterial sinusitis  Vertigo  Acute suppurative otitis media of left ear without spontaneous rupture of tympanic membrane, recurrence not specified  History of vaginitis     Discharge Instructions      Start amoxicillin daily for 10 days to treat your sinus infection as well as your ear infection You may take Zofran every 8 hours as needed for nausea Use Flonase daily to help with your sinus  pressure and for sinus drainage Meclizine as needed for vertigo/dizziness.  Please note this medication can make you drowsy.  Do not drink alcohol or drive while you are on this medication Stay hydrated and rest Please follow-up with your PCP if your symptoms do not improve Please go to emergency room if you develop any worsening symptoms Hope you feel better soon!    ED Prescriptions     Medication Sig Dispense Auth. Provider   amoxicillin (AMOXIL) 875 MG tablet Take 1 tablet (875 mg total) by mouth 2 (two) times daily for 10 days. 20 tablet Radford Pax, NP   ondansetron (ZOFRAN-ODT) 4 MG  disintegrating tablet Take 1 tablet (4 mg total) by mouth every 8 (eight) hours as needed for nausea or vomiting. 10 tablet Radford Pax, NP   fluticasone (FLONASE) 50 MCG/ACT nasal spray Place 1 spray into both nostrils daily. 16 g Radford Pax, NP   meclizine (ANTIVERT) 25 MG tablet Take 1 tablet (25 mg total) by mouth 3 (three) times daily as needed for dizziness. 10 tablet Radford Pax, NP   fluconazole (DIFLUCAN) 150 MG tablet Take 1 tablet (150 mg total) by mouth daily. 1 tablet Radford Pax, NP      PDMP not reviewed this encounter.   Radford Pax, NP 04/25/23 1122

## 2023-04-29 ENCOUNTER — Encounter: Payer: Self-pay | Admitting: Emergency Medicine

## 2023-04-29 ENCOUNTER — Telehealth: Payer: Self-pay

## 2023-04-29 ENCOUNTER — Ambulatory Visit
Admission: EM | Admit: 2023-04-29 | Discharge: 2023-04-29 | Disposition: A | Discharge status: Home / Self Care | Payer: No Typology Code available for payment source | Attending: Urgent Care | Admitting: Urgent Care

## 2023-04-29 DIAGNOSIS — J309 Allergic rhinitis, unspecified: Secondary | ICD-10-CM | POA: Diagnosis not present

## 2023-04-29 DIAGNOSIS — H6993 Unspecified Eustachian tube disorder, bilateral: Secondary | ICD-10-CM | POA: Diagnosis not present

## 2023-04-29 DIAGNOSIS — J018 Other acute sinusitis: Secondary | ICD-10-CM | POA: Diagnosis not present

## 2023-04-29 DIAGNOSIS — R42 Dizziness and giddiness: Secondary | ICD-10-CM

## 2023-04-29 MED ORDER — PREDNISONE 20 MG PO TABS: ORAL_TABLET | ORAL | 0 refills | Status: DC

## 2023-04-29 MED ORDER — CETIRIZINE HCL 10 MG PO TABS: 10.0000 mg | ORAL_TABLET | Freq: Every day | ORAL | 0 refills | Status: DC

## 2023-04-29 MED ORDER — MECLIZINE HCL 25 MG PO TABS: 25.0000 mg | ORAL_TABLET | Freq: Three times a day (TID) | ORAL | 0 refills | Status: DC | PRN

## 2023-04-29 NOTE — ED Triage Notes (Signed)
Pt states she was seen on 6/19 and was dx with a sinus infection, ear infection and given medication for vertigo. States she she is still dizzy and has a significant amount of post nasal drainage. Requesting a stronger medication. Has 2 more days of Amoxicillin and finished the vertigo medication.

## 2023-04-29 NOTE — Discharge Instructions (Addendum)
Finish amoxicillin for the sinus infection/ear infection, add in Zyrtec, prednisone and this will help with sinus drainage, inflammation, eustachian tube dysfunction. Use meclizine as needed for the dizziness.

## 2023-04-29 NOTE — ED Provider Notes (Signed)
[facility]  Note:  This document was prepared using Conservation officer, historic buildings and may include unintentional dictation errors.  MRN: [id number] DOB: 05-26-1971  Subjective:   Deanna Scott is a 52 y.o. female presenting for recheck on persistent dizziness, sinus pressure, sinus inflammation, postnasal drainage, ear pain.  Patient was seen 04/25/2023, managed with amoxicillin for a sinus infection and ear infection of the left side.  She has used meclizine but still has symptoms.  She is also been using Flonase.  No chest pain, shob, wheezing. No asthma. Has 30 pack year history of smoking, quit 6 years ago. No smoking of any kind including cigarettes, cigars, vaping, marijuana use.     Current Facility-Administered Medications:    sodium chloride flush (NS) 0.9 % injection 3 mL, 3 mL, Intravenous, Q12H, Chilton Si, MD  Current Outpatient Medications:    acetaminophen (TYLENOL 8 HOUR) 650 MG CR tablet, Take 2 tablets (1,300 mg total) by mouth every 8 (eight) hours as needed for pain., Disp: 30 tablet, Rfl: 0   albuterol (VENTOLIN HFA) 108 (90 Base) MCG/ACT inhaler, Inhale 1 puff into the lungs every 4 (four) hours as needed for wheezing or shortness of breath., Disp: 18 g, Rfl: 0   amoxicillin (AMOXIL) 875 MG tablet, Take 1 tablet (875 mg total) by mouth 2 (two) times daily for 10 days., Disp: 20 tablet, Rfl: 0   aspirin-acetaminophen-caffeine (EXCEDRIN MIGRAINE) 250-250-65 MG per tablet, Take 1 tablet by mouth every 6 (six) hours as needed for pain or migraine., Disp: , Rfl:    Blood Pressure KIT, Check blood pressure 2-3 times week, Disp: 1 kit, Rfl: 0   diclofenac (VOLTAREN) 50 MG EC tablet, Take 1 tablet (50 mg total) by mouth 2 (two) times daily., Disp: 10 tablet, Rfl: 0   fluconazole (DIFLUCAN) 150 MG tablet, Take 1 tablet (150 mg total) by mouth daily., Disp: 1 tablet, Rfl: 0   fluticasone (FLONASE) 50 MCG/ACT nasal spray, Place 1 spray into both  nostrils daily., Disp: 16 g, Rfl: 0   fluticasone-salmeterol (ADVAIR HFA) 115-21 MCG/ACT inhaler, Inhale 2 puffs into the lungs 2 (two) times daily., Disp: 12 g, Rfl: 0   isosorbide mononitrate (IMDUR) 60 MG 24 hr tablet, Take 1 tablet (60 mg total) by mouth daily., Disp: 90 tablet, Rfl: 0   loratadine (CLARITIN) 10 MG tablet, Take 10 mg by mouth daily as needed for allergies., Disp: , Rfl:    meclizine (ANTIVERT) 25 MG tablet, Take 1 tablet (25 mg total) by mouth 3 (three) times daily as needed for dizziness., Disp: 10 tablet, Rfl: 0   Multiple Vitamin (MULTIVITAMIN ADULT PO), Take 1 tablet by mouth daily., Disp: , Rfl:    nitroGLYCERIN (NITROSTAT) 0.4 MG SL tablet, Place 1 tablet (0.4 mg total) under the tongue every 5 (five) minutes as needed for chest pain., Disp: 90 tablet, Rfl: 3   ondansetron (ZOFRAN-ODT) 4 MG disintegrating tablet, Take 1 tablet (4 mg total) by mouth every 8 (eight) hours as needed for nausea or vomiting., Disp: 10 tablet, Rfl: 0   pantoprazole (PROTONIX) 40 MG tablet, Take 1 tablet (40 mg total) by mouth daily., Disp: 90 tablet, Rfl: 0   polyethylene glycol powder (GLYCOLAX/MIRALAX) 17 GM/SCOOP powder, Take 17 g by mouth daily as needed for moderate constipation., Disp: , Rfl:    rosuvastatin (CRESTOR) 10 MG tablet, Take 1 tablet (10 mg total) by mouth 3 (three) times a week. (Patient not taking: Reported on 02/12/2023), Disp: 39  tablet, Rfl: 3   vitamin B-12 (CYANOCOBALAMIN) 500 MCG tablet, Take 500 mcg by mouth 3 (three) times a week., Disp: , Rfl:    Allergies  Allergen Reactions   Latex Itching and Dermatitis   Hydrocodone-Acetaminophen Other (See Comments)    CNS dysphoria   Oxycodone-Acetaminophen Other (See Comments)    CNS Dysphoria    Past Medical History:  Diagnosis Date   Abnormal Pap smear    Allergy    latex   Breast pain, left 06/09/2022   Elevated blood pressure reading 09/06/2021   GERD (gastroesophageal reflux disease)    Migraines    otc meds prn    Plantar fasciitis, bilateral    Seasonal allergies    Shortness of breath    with exercise - smoker   Shoulder pain, right    otc meds   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   Qualifier: Diagnosis of  By: Benjamin Stain MD, Maisie Fus       Past Surgical History:  Procedure Laterality Date   ABDOMINAL HYSTERECTOMY     BLADDER SUSPENSION  2009   CORONARY PRESSURE/FFR STUDY N/A 06/13/2022   Procedure: INTRAVASCULAR PRESSURE WIRE/FFR STUDY;  Surgeon: Lyn Records, MD;  Location: [facility];  Service: Cardiovascular;  Laterality: N/A;   DIAGNOSTIC LAPAROSCOPY     ectopic pregnancy   DILATION AND CURETTAGE OF UTERUS     hx mab   endoscopy nasal sinus surgery  01/13/2020   [facility] - CFS Leak Repair   LEFT HEART CATH AND CORONARY ANGIOGRAPHY N/A 06/13/2022   Procedure: LEFT HEART CATH AND CORONARY ANGIOGRAPHY;  Surgeon: Lyn Records, MD;  Location: [facility];  Service: Cardiovascular;  Laterality: N/A;   SACROSPINOUS LIGAMENT FIXATION     posterior repair wtih cysto   SVD     x 2   TUBAL LIGATION  1999   VAGINAL HYSTERECTOMY  03/13/2012   Procedure: HYSTERECTOMY VAGINAL;  Surgeon: Catalina Antigua, MD;  Location: [facility];  Service: Gynecology;  Laterality: N/A;    Family History  Problem Relation Age of Onset   Other Mother        balance issues from a "thing in her brain" requires walker   Colon cancer Mother 54       dx 2019   Cancer Father    Heart disease Maternal Grandmother    Diabetes Maternal Grandmother    Rectal cancer Neg Hx    Stomach cancer Neg Hx     Social History   Tobacco Use   Smoking status: Former    Packs/day: 1.00    Years: 34.00    Additional pack years: 0.00    Total pack years: 34.00    Types: Cigarettes    Quit date: 09/04/2015    Years since quitting: 7.6   Smokeless tobacco: Never   Tobacco comments:    Started age 3 - quit age 83  Vaping Use   Vaping Use: Former  Substance Use Topics   Alcohol use: Yes    Alcohol/week: 2.0 standard  drinks of alcohol    Types: 2 Standard drinks or equivalent per week    Comment: socially   Drug use: No    ROS   Objective:   Vitals: BP 116/81 (BP Location: Right Arm)   Pulse 74   Temp 97.9 F (36.6 C) (Oral)   Resp 17   LMP 03/02/2012   SpO2 97%   Physical Exam Constitutional:      General: She is not  in acute distress.    Appearance: Normal appearance. She is well-developed and normal weight. She is not ill-appearing, toxic-appearing or diaphoretic.  HENT:     Head: Normocephalic and atraumatic.     Right Ear: Tympanic membrane, ear canal and external ear normal. No drainage or tenderness. No middle ear effusion. There is no impacted cerumen. Tympanic membrane is not erythematous or bulging.     Left Ear: Tympanic membrane, ear canal and external ear normal. No drainage or tenderness.  No middle ear effusion. There is no impacted cerumen. Tympanic membrane is not erythematous or bulging.     Nose: Congestion present. No rhinorrhea.     Mouth/Throat:     Mouth: Mucous membranes are moist. No oral lesions.     Pharynx: No pharyngeal swelling, oropharyngeal exudate, posterior oropharyngeal erythema or uvula swelling.     Tonsils: No tonsillar exudate or tonsillar abscesses.  Eyes:     General: No scleral icterus.       Right eye: No discharge.        Left eye: No discharge.     Extraocular Movements: Extraocular movements intact.     Right eye: Normal extraocular motion.     Left eye: Normal extraocular motion.     Conjunctiva/sclera: Conjunctivae normal.  Cardiovascular:     Rate and Rhythm: Normal rate and regular rhythm.     Heart sounds: Normal heart sounds. No murmur heard.    No friction rub. No gallop.  Pulmonary:     Effort: Pulmonary effort is normal. No respiratory distress.     Breath sounds: No stridor. No wheezing, rhonchi or rales.  Chest:     Chest wall: No tenderness.  Musculoskeletal:     Cervical back: Normal range of motion and neck supple.   Lymphadenopathy:     Cervical: No cervical adenopathy.  Skin:    General: Skin is warm and dry.  Neurological:     General: No focal deficit present.     Mental Status: She is alert and oriented to person, place, and time.     Cranial Nerves: No cranial nerve deficit.     Motor: No weakness.     Coordination: Coordination normal.     Gait: Gait normal.  Psychiatric:        Mood and Affect: Mood normal.        Behavior: Behavior normal.     Assessment and Plan :   PDMP not reviewed this encounter.  1. Other acute sinusitis, recurrence not specified   2. Eustachian tube dysfunction, bilateral   3. Allergic rhinitis, unspecified seasonality, unspecified trigger   4. 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  Vital signs hemodynamically stable.  Recommend finishing amoxicillin, I did offer patient to switch the antibiotic but she will maintain it and I am in agreement.  I will add in prednisone for her allergic rhinitis, vertigo and eustachian tube dysfunction.  Recommend using Zyrtec, continued supportive care.  Meclizine did help her some and therefore I will refill it.  Deferred imaging given clear cardiopulmonary exam, hemodynamically stable vital signs.  Counseled patient on potential for adverse effects with medications prescribed/recommended today, ER and return-to-clinic precautions discussed, patient verbalized understanding.    Wallis Bamberg, [place] 04/29/23 1514

## 2023-05-14 ENCOUNTER — Other Ambulatory Visit: Payer: Self-pay

## 2023-05-21 ENCOUNTER — Encounter: Payer: Self-pay | Admitting: Family Medicine

## 2023-05-21 ENCOUNTER — Ambulatory Visit (INDEPENDENT_AMBULATORY_CARE_PROVIDER_SITE_OTHER): Payer: No Typology Code available for payment source | Admitting: Family Medicine

## 2023-05-21 VITALS — BP 109/90 | HR 80 | Ht 62.0 in | Wt 211.0 lb

## 2023-05-21 DIAGNOSIS — J329 Chronic sinusitis, unspecified: Secondary | ICD-10-CM

## 2023-05-21 DIAGNOSIS — R42 Dizziness and giddiness: Secondary | ICD-10-CM

## 2023-05-21 DIAGNOSIS — I10 Essential (primary) hypertension: Secondary | ICD-10-CM

## 2023-05-21 DIAGNOSIS — N951 Menopausal and female climacteric states: Secondary | ICD-10-CM

## 2023-05-21 DIAGNOSIS — B9689 Other specified bacterial agents as the cause of diseases classified elsewhere: Secondary | ICD-10-CM

## 2023-05-21 MED ORDER — MECLIZINE HCL 25 MG PO TABS
25.0000 mg | ORAL_TABLET | Freq: Three times a day (TID) | ORAL | 0 refills | Status: DC | PRN
Start: 2023-05-21 — End: 2023-09-21
  Filled 2023-05-21: qty 10, 4d supply, fill #0

## 2023-05-21 MED ORDER — ISOSORBIDE MONONITRATE ER 60 MG PO TB24
60.0000 mg | ORAL_TABLET | Freq: Every day | ORAL | 0 refills | Status: DC
Start: 2023-05-21 — End: 2023-09-21
  Filled 2023-05-21 – 2023-06-19 (×2): qty 90, 90d supply, fill #0

## 2023-05-21 MED ORDER — FLUTICASONE PROPIONATE 50 MCG/ACT NA SUSP
1.0000 | Freq: Every day | NASAL | 0 refills | Status: DC
Start: 2023-05-21 — End: 2023-09-21
  Filled 2023-05-21: qty 16, 30d supply, fill #0

## 2023-05-21 NOTE — Assessment & Plan Note (Signed)
Based on head impulse test, history, and physical examination suspect worsening of baseline vertigo with contribution of sinus congestion and eustachian tube involvement.  Differential includes benign positional vertigo, other central etiologies.  No concern for stroke, cranial injuries, CSF leaks, cranial hypertension.  Consolation of symptoms not consistent with Mnire's disease. - Instructed patient to follow-up with established ENT and neuro specialists - Refilled patient's meclizine 25 mg, instructed patient to use sparingly if possible - Provided education that symptoms should improve with management of nasal congestion with fluticasone nasal spray

## 2023-05-21 NOTE — Assessment & Plan Note (Addendum)
Suspect patient is going through perimenopause based on symptoms.  Reassured by lack of red flag symptoms.  Briefly discussed hormone replacement therapy patient, but defer conversation to future date.

## 2023-05-21 NOTE — Assessment & Plan Note (Signed)
Resolved, no concern for continued infection.  Suspect postnasal drip is contributing to exacerbation of vertigo symptoms. - Instructed patient to continue using fluticasone nasal spray to reduce nasal inflammation and postnasal drip and relieve eustachian tube obstruction

## 2023-05-21 NOTE — Assessment & Plan Note (Signed)
Mildly elevated today, suspect contribution of pain and vertigo. - Continue monitor at follow-up - Refilled Imdur

## 2023-05-21 NOTE — Progress Notes (Signed)
SUBJECTIVE:   CHIEF COMPLAINT / HPI:  Deanna Scott is a 52 y.o. female with a pertinent past medical history of migraines, HTN, BPPV, and CSF leak with endoscopic repair in 2021 presenting to the clinic for worsening dizziness.  Dizziness, headache Had sinus infection in ED on June 19th, got antibiotics and steroids.  Infection improved, but did not fully resolve.  Dizziness became worse around that time.  Dizziness was present intermittently before sinusitis, but has been worse since. 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 Endorses some intermittent tinnitus, but not specifically associated with the dizziness. Likewise, he endorses headache that is not associated with dizziness timing. Moving around makes the vertigo worse, in particular with certain movements. Dizziness is getting worse, sometimes gets dizzy even with sitting now. Normally room spins, but more recently vision seems to be zooming in and out too. Denies any leakage of fluid from ears or nose.  Does endorse some rhinorrhea that is nearly resolved.  Feels some postnasal drip. No morning headaches or positional nausea/vomiting.  No nausea/vomiting in general.  Perimenopause Has had night sweats for the last few months.  No associated fevers, weight loss, other systemic symptoms. Has been using topical estrogen cream without effect. Had a hysterectomy several years ago and does not get menstrual periods. Review of systems otherwise negative.   PERTINENT PMH / PSH: migraines, HTN, BPPV, and CSF leak with endoscopic repair in 2021   OBJECTIVE:   BP (!) 109/90   Pulse 80   Ht 5\' 2"  (1.575 m)   Wt 211 lb (95.7 kg)   LMP 03/02/2012   SpO2 97%   BMI 38.59 kg/m   General: Age-appropriate, resting comfortably in chair, NAD, WNWD, alert and at baseline. HEENT:  Head: Normocephalic, atraumatic. No tenderness to percussion over sinuses. No cranial  bruising. Eyes: PERRLA. No conjunctival erythema or scleral injections. Ears: TMs non-bulging and non-erythematous bilaterally. No erythema of external ear canal. No cerumen impaction. No leakage of fluid. Nose: Mild erythema of nasal turbinates.  No leakage of fluid. Mouth/Oral: Clear, no tonsillar exudate. MMM. Neck: Supple, full ROM. Extremities: No peripheral edema bilaterally. Capillary refill <2 seconds  Neurological Examination: Special: Head impulse test negative. MS: Awake, alert, interactive. Normal eye contact, answered the questions appropriately, speech was fluent, normal comprehension.  Attention and concentration were normal. Cranial Nerves: CNs II-XII intact Tone: Normal Strength: Normal strength in all muscle groups bilaterally Sensation: Romberg negative. Gait: Slow walk, some vertigo reported.  Uses exam table for support.  ASSESSMENT/PLAN:   Problem List Items Addressed This Visit       Cardiovascular and Mediastinum   HTN (hypertension)    Mildly elevated today, suspect contribution of pain and vertigo. - Continue monitor at follow-up - Refilled Imdur      Relevant Medications   isosorbide mononitrate (IMDUR) 60 MG 24 hr tablet     Respiratory   Bacterial sinusitis    Resolved, no concern for continued infection.  Suspect postnasal drip is contributing to exacerbation of vertigo symptoms. - Instructed patient to continue using fluticasone nasal spray to reduce nasal inflammation and postnasal drip and relieve eustachian tube obstruction      Relevant Medications   fluticasone (FLONASE) 50 MCG/ACT nasal spray     Other   Vertigo - Primary    Based on head impulse test, history, and physical examination suspect worsening of baseline vertigo with contribution of sinus congestion and eustachian  tube involvement.  Differential includes benign positional vertigo, other central etiologies.  No concern for stroke, cranial injuries, CSF leaks, cranial  hypertension.  Consolation of symptoms not consistent with Mnire's disease. - Instructed patient to follow-up with established ENT and neuro specialists - Refilled patient's meclizine 25 mg, instructed patient to use sparingly if possible - Provided education that symptoms should improve with management of nasal congestion with fluticasone nasal spray      Relevant Medications   meclizine (ANTIVERT) 25 MG tablet   Perimenopause    Suspect patient is going through perimenopause based on symptoms.  Reassured by lack of red flag symptoms.  Briefly discussed hormone replacement therapy patient, but defer conversation to future date.      Return in about 1 month (around 06/21/2023).  Shermon Bozzi Sharion Dove, MD [facility]

## 2023-05-22 ENCOUNTER — Other Ambulatory Visit (HOSPITAL_COMMUNITY): Payer: Self-pay

## 2023-06-19 ENCOUNTER — Other Ambulatory Visit: Payer: Self-pay

## 2023-06-19 ENCOUNTER — Other Ambulatory Visit (HOSPITAL_COMMUNITY): Payer: Self-pay

## 2023-06-19 ENCOUNTER — Other Ambulatory Visit: Payer: Self-pay | Admitting: Family Medicine

## 2023-06-21 ENCOUNTER — Other Ambulatory Visit (HOSPITAL_COMMUNITY): Payer: Self-pay

## 2023-06-23 MED ORDER — PANTOPRAZOLE SODIUM 40 MG PO TBEC
40.0000 mg | DELAYED_RELEASE_TABLET | Freq: Every day | ORAL | 0 refills | Status: DC
  Filled 2023-06-23: qty 90, 90d supply, fill #0

## 2023-06-25 ENCOUNTER — Other Ambulatory Visit (HOSPITAL_COMMUNITY): Payer: Self-pay

## 2023-06-26 ENCOUNTER — Other Ambulatory Visit (HOSPITAL_COMMUNITY): Payer: Self-pay

## 2023-09-21 ENCOUNTER — Ambulatory Visit (INDEPENDENT_AMBULATORY_CARE_PROVIDER_SITE_OTHER): Payer: No Typology Code available for payment source | Admitting: Student

## 2023-09-21 ENCOUNTER — Other Ambulatory Visit (HOSPITAL_COMMUNITY): Payer: Self-pay

## 2023-09-21 ENCOUNTER — Ambulatory Visit: Payer: No Typology Code available for payment source | Admitting: Family Medicine

## 2023-09-21 VITALS — BP 127/80 | HR 76 | Wt 210.0 lb

## 2023-09-21 DIAGNOSIS — R42 Dizziness and giddiness: Secondary | ICD-10-CM | POA: Diagnosis not present

## 2023-09-21 DIAGNOSIS — I10 Essential (primary) hypertension: Secondary | ICD-10-CM

## 2023-09-21 DIAGNOSIS — H8110 Benign paroxysmal vertigo, unspecified ear: Secondary | ICD-10-CM

## 2023-09-21 DIAGNOSIS — J0111 Acute recurrent frontal sinusitis: Secondary | ICD-10-CM | POA: Diagnosis not present

## 2023-09-21 DIAGNOSIS — R350 Frequency of micturition: Secondary | ICD-10-CM

## 2023-09-21 DIAGNOSIS — B9689 Other specified bacterial agents as the cause of diseases classified elsewhere: Secondary | ICD-10-CM

## 2023-09-21 LAB — POCT GLYCOSYLATED HEMOGLOBIN (HGB A1C): HbA1c, POC (prediabetic range): 6.2 % (ref 5.7–6.4)

## 2023-09-21 LAB — POCT URINALYSIS DIP (MANUAL ENTRY)
Bilirubin, UA: NEGATIVE
Blood, UA: NEGATIVE
Glucose, UA: NEGATIVE mg/dL
Ketones, POC UA: NEGATIVE mg/dL
Leukocytes, UA: NEGATIVE
Nitrite, UA: NEGATIVE
Protein Ur, POC: NEGATIVE mg/dL
Spec Grav, UA: 1.02 (ref 1.010–1.025)
Urobilinogen, UA: 0.2 U/dL
pH, UA: 7.5 (ref 5.0–8.0)

## 2023-09-21 MED ORDER — ISOSORBIDE MONONITRATE ER 60 MG PO TB24
60.0000 mg | ORAL_TABLET | Freq: Every day | ORAL | 0 refills | Status: DC
Start: 2023-09-21 — End: 2024-01-18
  Filled 2023-09-21: qty 90, 90d supply, fill #0

## 2023-09-21 MED ORDER — FLUTICASONE PROPIONATE 50 MCG/ACT NA SUSP
1.0000 | Freq: Every day | NASAL | 0 refills | Status: DC
Start: 2023-09-21 — End: 2024-01-18
  Filled 2023-09-21: qty 16, 60d supply, fill #0

## 2023-09-21 MED ORDER — MECLIZINE HCL 25 MG PO TABS
25.0000 mg | ORAL_TABLET | Freq: Three times a day (TID) | ORAL | 0 refills | Status: DC | PRN
Start: 2023-09-21 — End: 2023-12-05
  Filled 2023-09-21: qty 10, 4d supply, fill #0

## 2023-09-21 NOTE — Assessment & Plan Note (Signed)
Differential diagnosis for dizziness includes central causes such as stroke, intracranial bleed, mass and peripheral causes such as BPPV, meniere's disease, viral.  Vertigo is positional, patient has normal neurologic exam, including normal gait and coordination, and no risk factors of stroke//have low suspicion for CVA or other central cause of vertigo. Rx for Antivert refill on request.  Sent the patient to vestibular rehab

## 2023-09-21 NOTE — Assessment & Plan Note (Signed)
Did order Flonase for a component of postnasal drip in addition to instructing to take Zyrtec daily.  Referred to ENT as well as she has recurrent episodes of sinusitis.

## 2023-09-21 NOTE — Patient Instructions (Addendum)
It was great to see you today! Thank you for choosing [facility] for your primary care.  Today we addressed: I have sent you to the Ear nose and throat doctor  Also I have sent you to vestibular rehab  Use the intranasal steroids  Take tylenol for pain   If you haven't already, sign up for My Chart to have easy access to your labs results, and communication with your primary care physician. I recommend that you always bring your medications to each appointment as this makes it easy to ensure you are on the correct medications and helps [place] not miss refills when you need them. Call the clinic at [phone] if your symptoms worsen or you have any concerns.  Please arrive 15 minutes before your appointment to ensure smooth check in process.  We appreciate your efforts in making this happen.  Thank you for allowing me to participate in your care, Alfredo Martinez, MD 09/21/2023, 3:46 PM PGY-3, [facility]

## 2023-09-21 NOTE — Progress Notes (Signed)
    SUBJECTIVE:   CHIEF COMPLAINT / HPI:   Sinus Concern  -A week of a sinus infection  -Nasal drainage  -No fever or chills  -Headaches at present  -Sinus pain  -No dyspnea, chest pain   Dizziness She reports new onset dizziness. She describes it as feeling like room is spinning, occurs intermittently, and typically lasts as long movement happens.  It typically occurs when she is turning head from side to side. It is usually relieved by  resting . She has not started new medications around the time the dizziness started.  Patient does report that she has a long history of vertigo intermittently that sometimes starts after a sinus infection.  She has tried Antivert with no relief.  Current symptoms of dizziness are the same as previous.  No hearing changes.   Patient has a documented history of vertigo/dizziness from 2019 secondary to a viral sinus infection.  Urinary Frequency:  Patient also mentions increased frequency of urination without dysuria, without fever or chills.  PERTINENT  PMH / PSH: Recurrent sinus infections, vertigo   OBJECTIVE:   BP 127/80   Pulse 76   Wt 210 lb (95.3 kg)   LMP 03/02/2012   SpO2 97%   BMI 38.41 kg/m   General: Alert and oriented in no apparent distress Head: [place]/AT.   Eyes:  EOMI Ears:  External ears WNL, Bilateral TM's normal without retraction, redness or bulging. Nose:  Septum midline  Mouth:  MMM, tonsils non-erythematous, non-edematous.   Heart: Regular rate and rhythm with no murmurs appreciated Lungs: CTA bilaterally, no wheezing Abdomen: Bowel sounds present, no abdominal pain Skin: Warm and dry Extremities: No lower extremity edema Neuro: CN III, IV,VI: EOMI CV V: Normal sensation in V1, V2, V3 CVII: Symmetric smile and brow raise CN VIII: Normal hearing UE and LE strength 5/5 Normal sensation in UE and LE bilaterally  No ataxia with finger to nose, normal heel to shin  Negative Rhomberg     ASSESSMENT/PLAN:    Assessment & Plan Vertigo Differential diagnosis for dizziness includes central causes such as stroke, intracranial bleed, mass and peripheral causes such as BPPV, meniere's disease, viral.  Vertigo is positional, patient has normal neurologic exam, including normal gait and coordination, and no risk factors of stroke//have low suspicion for CVA or other central cause of vertigo. Rx for Antivert refill on request.  Sent the patient to vestibular rehab Frequency of urination Ordered UA and discussed that it was without infection. Went ahead with A1C as she has not had one for about a year.  However, it is a low likelihood as she does not have glucose or ketones in her urine.  Acute recurrent frontal sinusitis Did order Flonase for a component of postnasal drip in addition to instructing to take Zyrtec daily.  Referred to ENT as well as she has recurrent episodes of sinusitis.     Alfredo Martinez, MD [facility]

## 2023-09-24 ENCOUNTER — Encounter: Payer: Self-pay | Admitting: Student

## 2023-09-26 ENCOUNTER — Encounter: Payer: Self-pay | Admitting: Gastroenterology

## 2023-10-19 ENCOUNTER — Ambulatory Visit (INDEPENDENT_AMBULATORY_CARE_PROVIDER_SITE_OTHER): Payer: No Typology Code available for payment source | Admitting: Student

## 2023-10-19 ENCOUNTER — Other Ambulatory Visit (HOSPITAL_COMMUNITY): Payer: Self-pay

## 2023-10-19 ENCOUNTER — Other Ambulatory Visit: Payer: Self-pay

## 2023-10-19 VITALS — BP 101/75 | HR 78 | Ht 63.0 in | Wt 211.2 lb

## 2023-10-19 DIAGNOSIS — N951 Menopausal and female climacteric states: Secondary | ICD-10-CM | POA: Diagnosis not present

## 2023-10-19 DIAGNOSIS — R5383 Other fatigue: Secondary | ICD-10-CM

## 2023-10-19 DIAGNOSIS — Z1231 Encounter for screening mammogram for malignant neoplasm of breast: Secondary | ICD-10-CM

## 2023-10-19 DIAGNOSIS — E785 Hyperlipidemia, unspecified: Secondary | ICD-10-CM

## 2023-10-19 DIAGNOSIS — I10 Essential (primary) hypertension: Secondary | ICD-10-CM

## 2023-10-19 MED ORDER — VENLAFAXINE HCL ER 37.5 MG PO CP24
ORAL_CAPSULE | ORAL | 0 refills | Status: DC
  Filled 2023-10-19: qty 90, 48d supply, fill #0

## 2023-10-19 MED ORDER — VENLAFAXINE HCL ER 37.5 MG PO CP24
75.0000 mg | ORAL_CAPSULE | Freq: Every day | ORAL | 0 refills | Status: DC
  Filled 2023-10-19: qty 90, 45d supply, fill #0
  Filled 2023-10-19: qty 90, 49d supply, fill #0

## 2023-10-19 NOTE — Patient Instructions (Addendum)
It was great seeing you today.  As we discussed, - It is likely you are in menopause. I sent in a medication (Venlafaxine) to your pharmacy. Take this as prescribed. You should see improvement in your symptoms. - We are checking blood work. I will call you if anything is abnormal. - Please call and schedule your mammogram at the GI breast center. I sent the order. - Try using a wrist splint at night for your hand numbness. Your symptoms seem fitting with carpal tunnel. See image below of carpal tunnel wrist brace:    If you have any questions or concerns, please feel free to call the clinic.   Have a wonderful day,  Dr. Darral Dash [facility] [phone]

## 2023-10-19 NOTE — Progress Notes (Signed)
    SUBJECTIVE:   CHIEF COMPLAINT / HPI:   Deanna Scott is a 52 year-old female here for concern for hot flashes for about 1 year.  S/p hysterectomy.  She reports she is having hot flashes at night so severe she is drenching the sheets. No fever, or chills. Not sexually active.  Does not some irritability, not worse than usual.  Numbness in hands up to her forearms which hurts and she has to shake her hand out to make it not numb anymore. She is self-employed as a Financial trader. She   PERTINENT  PMH / PSH: ***  OBJECTIVE:   LMP 03/02/2012  ***   ASSESSMENT/PLAN:   No problem-specific Assessment & Plan notes found for this encounter.     Darral Dash, DO [facility]    {    This will disappear when note is signed, click to select method of visit    :1}

## 2023-10-20 LAB — LIPID PANEL
Chol/HDL Ratio: 5.7 {ratio} — ABNORMAL HIGH (ref 0.0–4.4)
Cholesterol, Total: 228 mg/dL — ABNORMAL HIGH (ref 100–199)
HDL: 40 mg/dL (ref >39)
LDL Chol Calc (NIH): 121 mg/dL — ABNORMAL HIGH (ref 0–99)
Triglycerides: 380 mg/dL — ABNORMAL HIGH (ref 0–149)
VLDL Cholesterol Cal: 67 mg/dL — ABNORMAL HIGH (ref 5–40)

## 2023-10-20 LAB — CBC WITH DIFFERENTIAL/PLATELET
Basophils Absolute: 0.1 10*3/uL (ref 0.0–0.2)
Basos: 1 %
EOS (ABSOLUTE): 0.2 10*3/uL (ref 0.0–0.4)
Eos: 2 %
Hematocrit: 39.3 % (ref 34.0–46.6)
Hemoglobin: 13.1 g/dL (ref 11.1–15.9)
Immature Grans (Abs): 0 10*3/uL (ref 0.0–0.1)
Immature Granulocytes: 0 %
Lymphocytes Absolute: 2.4 10*3/uL (ref 0.7–3.1)
Lymphs: 24 %
MCH: 30 pg (ref 26.6–33.0)
MCHC: 33.3 g/dL (ref 31.5–35.7)
MCV: 90 fL (ref 79–97)
Monocytes Absolute: 0.6 10*3/uL (ref 0.1–0.9)
Monocytes: 6 %
Neutrophils Absolute: 6.5 10*3/uL (ref 1.4–7.0)
Neutrophils: 67 %
Platelets: 255 10*3/uL (ref 150–450)
RBC: 4.37 x10E6/uL (ref 3.77–5.28)
RDW: 13.2 % (ref 11.7–15.4)
WBC: 9.7 10*3/uL (ref 3.4–10.8)

## 2023-10-20 LAB — HEMOGLOBIN A1C
Est. average glucose Bld gHb Est-mCnc: 143 mg/dL
Hgb A1c MFr Bld: 6.6 % — ABNORMAL HIGH (ref 4.8–5.6)

## 2023-10-20 LAB — VITAMIN B12: Vitamin B-12: 769 pg/mL (ref 232–1245)

## 2023-10-21 NOTE — Assessment & Plan Note (Signed)
Lipid panel today, as she is due. Patient had previously self discontinued rosuvastatin.

## 2023-10-21 NOTE — Assessment & Plan Note (Addendum)
Symptoms consistent with perimenopause/menopause, unsure timeline of amenorrhea as patient is s/p hysterectomy. Will also check basic blood work today with CBC, CMP and B12. She was seen in July for the same symptoms and deferred hormone therapy at that time. Given vasomotor symptoms seem to be worsening and interfere with sleep/work, she would like to start medication. I prescribed venlafaxine 37.5 mg 1 tablet daily for 1 week, then increase to 2 tablets daily for 75 mg total. Encouraged exercise, working on diet and other lifestyle changes as well. Return if minimal or no improvement.

## 2023-10-22 ENCOUNTER — Other Ambulatory Visit: Payer: Self-pay

## 2023-10-25 ENCOUNTER — Other Ambulatory Visit: Payer: Self-pay | Admitting: Student

## 2023-10-25 ENCOUNTER — Other Ambulatory Visit: Payer: Self-pay

## 2023-10-25 DIAGNOSIS — E782 Mixed hyperlipidemia: Secondary | ICD-10-CM

## 2023-10-25 MED ORDER — ROSUVASTATIN CALCIUM 10 MG PO TABS
10.0000 mg | ORAL_TABLET | ORAL | 3 refills | Status: DC
Start: 2023-10-26 — End: 2023-12-17
  Filled 2023-10-25: qty 39, 90d supply, fill #0

## 2023-11-01 ENCOUNTER — Institutional Professional Consult (permissible substitution) (INDEPENDENT_AMBULATORY_CARE_PROVIDER_SITE_OTHER): Payer: No Typology Code available for payment source

## 2023-11-05 ENCOUNTER — Other Ambulatory Visit: Payer: Self-pay

## 2023-12-03 ENCOUNTER — Telehealth (INDEPENDENT_AMBULATORY_CARE_PROVIDER_SITE_OTHER): Payer: Self-pay | Admitting: Otolaryngology

## 2023-12-03 NOTE — Telephone Encounter (Signed)
Reminder Call: Date: 12/04/2023 Status: Sch  Time: 1:00 PM [address] [place], [place] [address]  Left voicemail w/time and location.

## 2023-12-04 ENCOUNTER — Encounter (INDEPENDENT_AMBULATORY_CARE_PROVIDER_SITE_OTHER): Payer: Self-pay

## 2023-12-04 ENCOUNTER — Telehealth (INDEPENDENT_AMBULATORY_CARE_PROVIDER_SITE_OTHER): Payer: Self-pay | Admitting: Otolaryngology

## 2023-12-04 ENCOUNTER — Ambulatory Visit (INDEPENDENT_AMBULATORY_CARE_PROVIDER_SITE_OTHER): Payer: No Typology Code available for payment source | Admitting: Otolaryngology

## 2023-12-04 VITALS — BP 123/78 | HR 62 | Ht 62.0 in | Wt 209.0 lb

## 2023-12-04 DIAGNOSIS — H9313 Tinnitus, bilateral: Secondary | ICD-10-CM | POA: Insufficient documentation

## 2023-12-04 DIAGNOSIS — R42 Dizziness and giddiness: Secondary | ICD-10-CM

## 2023-12-04 NOTE — Progress Notes (Signed)
Patient ID: Deanna Scott, female   DOB: 1971-01-09, 53 y.o.   MRN: [id number]  CC: Recurrent dizziness  HPI:  Deanna Scott is a 53 y.o. female who presents today complaining of recurrent dizziness for the past 2 months.  She describes her dizziness as a lightheaded and off-balance sensation.  She has also noted occasional brief spinning vertigo.  Her dizziness episodes typically last for several minutes.  She has noted occasional nausea and nasal drainage during the dizzy episodes.  The patient has a history of transnasal CSF leak.  She underwent successful surgical repair in [place] in 2022.  The cause of the CSF leak is unknown.  Currently she denies any headaches, visual change, otalgia, or otorrhea.  She has no other ENT surgery.  She denies any recent otitis media or otitis externa.  She has occasional bilateral high-pitched tinnitus.  She denies any hearing difficulty.  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   Qualifier: Diagnosis of  By: Benjamin Stain MD, Maisie Fus      Past Surgical History:  Procedure Laterality Date   ABDOMINAL HYSTERECTOMY     BLADDER SUSPENSION  2009   CORONARY PRESSURE/FFR STUDY N/A 06/13/2022   Procedure: INTRAVASCULAR PRESSURE WIRE/FFR STUDY;  Surgeon: Lyn Records, MD;  Location: [facility];  Service: Cardiovascular;  Laterality: N/A;   DIAGNOSTIC LAPAROSCOPY     ectopic pregnancy   DILATION AND CURETTAGE OF UTERUS     hx mab   endoscopy nasal sinus surgery  01/13/2020   [facility] - CFS Leak Repair   LEFT HEART CATH AND CORONARY ANGIOGRAPHY N/A 06/13/2022   Procedure: LEFT HEART CATH AND CORONARY  ANGIOGRAPHY;  Surgeon: Lyn Records, MD;  Location: [facility];  Service: Cardiovascular;  Laterality: N/A;   SACROSPINOUS LIGAMENT FIXATION     posterior repair wtih cysto   SVD     x 2   TUBAL LIGATION  1999   VAGINAL HYSTERECTOMY  03/13/2012   Procedure: HYSTERECTOMY VAGINAL;  Surgeon: Catalina Antigua, MD;  Location: [facility];  Service: Gynecology;  Laterality: N/A;    Family History  Problem Relation Age of Onset   Other Mother        balance issues from a "thing in her brain" requires walker   Colon cancer Mother 65       dx 2019   Cancer Father    Heart disease Maternal Grandmother    Diabetes Maternal Grandmother    Rectal cancer Neg Hx    Stomach cancer Neg Hx     Social History:  reports that she quit smoking about 8 years ago. Her smoking use included cigarettes. She started smoking about 42 years ago. She has a 34 pack-year smoking history. She has never used smokeless tobacco. She reports current alcohol use of about 2.0 standard drinks of alcohol per week. She reports that she does not use drugs.  Allergies:  Allergies  Allergen Reactions   Latex Itching and Dermatitis   Hydrocodone-Acetaminophen Other (See Comments)    CNS dysphoria  Oxycodone-Acetaminophen Other (See Comments)    CNS Dysphoria    Prior to Admission medications   Medication Sig Start Date End Date Taking? Authorizing Provider  acetaminophen (TYLENOL 8 HOUR) 650 MG CR tablet Take 2 tablets (1,300 mg total) by mouth every 8 (eight) hours as needed for pain. 06/16/19  Yes Arlyce Harman, MD  albuterol (VENTOLIN HFA) 108 (90 Base) MCG/ACT inhaler Inhale 1 puff into the lungs every 4 (four) hours as needed for wheezing or shortness of breath. 09/19/21  Yes Simmons-Robinson, Tawanna Cooler, MD  aspirin-acetaminophen-caffeine (EXCEDRIN MIGRAINE) [phone] MG per tablet Take 1 tablet by mouth every 6 (six) hours as needed for pain or migraine.   Yes [provider]  Blood Pressure KIT Check  blood pressure 2-3 times week 09/05/21  Yes Dana Allan, MD  cetirizine (ZYRTEC ALLERGY) 10 MG tablet Take 1 tablet (10 mg total) by mouth daily. 04/29/23  Yes Wallis Bamberg, PA-C  diclofenac (VOLTAREN) 50 MG EC tablet Take 1 tablet (50 mg total) by mouth 2 (two) times daily. 03/13/22  Yes Arruda, Amy L, PA  fluconazole (DIFLUCAN) 150 MG tablet Take 1 tablet (150 mg total) by mouth daily. 04/25/23  Yes Radford Pax, NP  fluticasone (FLONASE) 50 MCG/ACT nasal spray Place 1 spray into both nostrils daily. 09/21/23  Yes Alfredo Martinez, MD  fluticasone-salmeterol (ADVAIR HFA) [phone] MCG/ACT inhaler Inhale 2 puffs into the lungs 2 (two) times daily. 09/19/21  Yes Simmons-Robinson, Makiera, MD  isosorbide mononitrate (IMDUR) 60 MG 24 hr tablet Take 1 tablet (60 mg total) by mouth daily. 09/21/23 12/20/23 Yes Alfredo Martinez, MD  loratadine (CLARITIN) 10 MG tablet Take 10 mg by mouth daily as needed for allergies.   Yes [provider]  meclizine (ANTIVERT) 25 MG tablet Take 1 tablet (25 mg total) by mouth 3 (three) times daily as needed for dizziness. 09/21/23  Yes Alfredo Martinez, MD  Multiple Vitamin (MULTIVITAMIN ADULT PO) Take 1 tablet by mouth daily.   Yes [provider]  ondansetron (ZOFRAN-ODT) 4 MG disintegrating tablet Take 1 tablet (4 mg total) by mouth every 8 (eight) hours as needed for nausea or vomiting. 04/25/23  Yes Radford Pax, NP  pantoprazole (PROTONIX) 40 MG tablet Take 1 tablet (40 mg total) by mouth daily. 06/23/23  Yes Alicia Amel, MD  polyethylene glycol powder (GLYCOLAX/MIRALAX) 17 GM/SCOOP powder Take 17 g by mouth daily as needed for moderate constipation. 01/15/20  Yes [provider]  predniSONE (DELTASONE) 20 MG tablet Take 2 tablets daily with breakfast. 04/29/23  Yes Wallis Bamberg, PA-C  rosuvastatin (CRESTOR) 10 MG tablet Take 1 tablet (10 mg total) by mouth 3 (three) times a week. 10/26/23 10/24/24 Yes Dameron, Nolberto Hanlon, DO  venlafaxine XR (EFFEXOR XR)  37.5 MG 24 hr capsule Take 1 capsule by mouth daily for one week, then increase to 2 capsules daily. 10/19/23  Yes Dameron, Nolberto Hanlon, DO  vitamin B-12 (CYANOCOBALAMIN) 500 MCG tablet Take 500 mcg by mouth 3 (three) times a week.   Yes [provider]  nitroGLYCERIN (NITROSTAT) 0.4 MG SL tablet Place 1 tablet (0.4 mg total) under the tongue every 5 (five) minutes as needed for chest pain. 06/09/22 09/07/22  Chilton Si, MD    Blood pressure 123/78, pulse 62, height 5\' 2"  (1.575 m), weight 209 lb (94.8 kg), last menstrual period 03/02/2012, SpO2 97%. Exam: General: Communicates without difficulty, well nourished, no acute distress. Head: Normocephalic, no evidence injury, no tenderness, facial buttresses intact without stepoff. Face/sinus: No tenderness to  palpation and percussion. Facial movement is normal and symmetric. Eyes: PERRL, EOMI. No scleral icterus, conjunctivae clear. Neuro: CN II exam reveals vision grossly intact.  No nystagmus at any point of gaze. Ears: Auricles well formed without lesions.  Ear canals are intact without mass or lesion.  No erythema or edema is appreciated.  The TMs are intact without fluid. Nose: External evaluation reveals normal support and skin without lesions.  Dorsum is intact.  Anterior rhinoscopy reveals congested mucosa over anterior aspect of inferior turbinates and intact septum.  No purulence noted. Oral:  Oral cavity and oropharynx are intact, symmetric, without erythema or edema.  Mucosa is moist without lesions. Neck: Full range of motion without pain.  There is no significant lymphadenopathy.  No masses palpable.  Thyroid bed within normal limits to palpation.  Parotid glands and submandibular glands equal bilaterally without mass.  Trachea is midline. Neuro:  CN 2-12 grossly intact. Vestibular: No nystagmus at any point of gaze. Dix Hallpike negative. Vestibular: There is no nystagmus with pneumatic pressure on either tympanic membrane or Valsalva. The  cerebellar examination is unremarkable.    Assessment: 1.  The patient's ear canals, tympanic membranes, and middle ear spaces are normal.  Her Dix-Hallpike maneuver is negative. 2.  Recurrent dizziness of unknown etiology. The possible differential diagnoses include transient BPPV, vestibular migraine, Meniere's disease, peripheral vestibular dysfunction, or other central/systemic causes.   3.  Bilateral subjective tinnitus.  She denies any hearing difficulty.  No audiologist is available today for hearing test.  Plan: 1.  The physical exam findings are reviewed with the patient. 2.  The pathophysiology of vestibular dysfunction and dizziness are discussed extensively with the patient. The possible differential diagnoses are reviewed. Questions are invited and answered.  3.  The strategies to cope with tinnitus, including the use of masker, hearing aids, tinnitus retraining therapy, and avoidance of caffeine and alcohol are discussed.  4.  The patient will likely benefit from undergoing physical therapy/vestibular rehabilitation to improve the balancing function. A referral will be arranged as soon as possible.  5.  If the patient continues to be symptomatic, she may benefit from vestibular neurodiagnostic testing at a tertiary care center to evaluate for possible vestibular dysfunction.   Yacqub Baston W Josslynn Mentzer 12/04/2023, 1:05 PM

## 2023-12-04 NOTE — Telephone Encounter (Signed)
Patient is aware that insurance is OON and still wanted to be seen.

## 2023-12-05 ENCOUNTER — Observation Stay (HOSPITAL_COMMUNITY): Payer: No Typology Code available for payment source

## 2023-12-05 ENCOUNTER — Other Ambulatory Visit: Payer: Self-pay

## 2023-12-05 ENCOUNTER — Emergency Department (HOSPITAL_COMMUNITY): Payer: No Typology Code available for payment source

## 2023-12-05 ENCOUNTER — Encounter (HOSPITAL_COMMUNITY): Payer: Self-pay | Admitting: Pharmacy Technician

## 2023-12-05 ENCOUNTER — Inpatient Hospital Stay (HOSPITAL_COMMUNITY)
Admission: EM | Admit: 2023-12-05 | Discharge: 2023-12-17 | DRG: 275 | Disposition: A | Discharge status: Rehabilitation Facility | Payer: No Typology Code available for payment source | Attending: Family Medicine | Admitting: Family Medicine

## 2023-12-05 DIAGNOSIS — E876 Hypokalemia: Secondary | ICD-10-CM

## 2023-12-05 DIAGNOSIS — I4729 Other ventricular tachycardia: Secondary | ICD-10-CM | POA: Diagnosis not present

## 2023-12-05 DIAGNOSIS — R410 Disorientation, unspecified: Secondary | ICD-10-CM | POA: Diagnosis not present

## 2023-12-05 DIAGNOSIS — E873 Alkalosis: Secondary | ICD-10-CM

## 2023-12-05 DIAGNOSIS — I493 Ventricular premature depolarization: Secondary | ICD-10-CM | POA: Diagnosis present

## 2023-12-05 DIAGNOSIS — R531 Weakness: Secondary | ICD-10-CM

## 2023-12-05 DIAGNOSIS — R55 Syncope and collapse: Secondary | ICD-10-CM | POA: Diagnosis present

## 2023-12-05 DIAGNOSIS — Z8249 Family history of ischemic heart disease and other diseases of the circulatory system: Secondary | ICD-10-CM

## 2023-12-05 DIAGNOSIS — R001 Bradycardia, unspecified: Secondary | ICD-10-CM | POA: Diagnosis present

## 2023-12-05 DIAGNOSIS — I469 Cardiac arrest, cause unspecified: Secondary | ICD-10-CM | POA: Diagnosis not present

## 2023-12-05 DIAGNOSIS — Z5941 Food insecurity: Secondary | ICD-10-CM

## 2023-12-05 DIAGNOSIS — H8110 Benign paroxysmal vertigo, unspecified ear: Secondary | ICD-10-CM | POA: Diagnosis present

## 2023-12-05 DIAGNOSIS — N3000 Acute cystitis without hematuria: Secondary | ICD-10-CM | POA: Diagnosis present

## 2023-12-05 DIAGNOSIS — I110 Hypertensive heart disease with heart failure: Secondary | ICD-10-CM | POA: Diagnosis present

## 2023-12-05 DIAGNOSIS — R4182 Altered mental status, unspecified: Secondary | ICD-10-CM | POA: Diagnosis present

## 2023-12-05 DIAGNOSIS — I82612 Acute embolism and thrombosis of superficial veins of left upper extremity: Secondary | ICD-10-CM | POA: Diagnosis not present

## 2023-12-05 DIAGNOSIS — Z833 Family history of diabetes mellitus: Secondary | ICD-10-CM

## 2023-12-05 DIAGNOSIS — Z5986 Financial insecurity: Secondary | ICD-10-CM

## 2023-12-05 DIAGNOSIS — G9349 Other encephalopathy: Secondary | ICD-10-CM | POA: Diagnosis present

## 2023-12-05 DIAGNOSIS — J9602 Acute respiratory failure with hypercapnia: Secondary | ICD-10-CM | POA: Diagnosis present

## 2023-12-05 DIAGNOSIS — I4721 Torsades de pointes: Secondary | ICD-10-CM | POA: Diagnosis present

## 2023-12-05 DIAGNOSIS — K219 Gastro-esophageal reflux disease without esophagitis: Secondary | ICD-10-CM | POA: Diagnosis present

## 2023-12-05 DIAGNOSIS — G9340 Encephalopathy, unspecified: Secondary | ICD-10-CM

## 2023-12-05 DIAGNOSIS — N179 Acute kidney failure, unspecified: Secondary | ICD-10-CM

## 2023-12-05 DIAGNOSIS — I5021 Acute systolic (congestive) heart failure: Secondary | ICD-10-CM

## 2023-12-05 DIAGNOSIS — Z5948 Other specified lack of adequate food: Secondary | ICD-10-CM

## 2023-12-05 DIAGNOSIS — R7401 Elevation of levels of liver transaminase levels: Secondary | ICD-10-CM

## 2023-12-05 DIAGNOSIS — Z91148 Patient's other noncompliance with medication regimen for other reason: Secondary | ICD-10-CM

## 2023-12-05 DIAGNOSIS — E785 Hyperlipidemia, unspecified: Secondary | ICD-10-CM | POA: Diagnosis present

## 2023-12-05 DIAGNOSIS — I442 Atrioventricular block, complete: Secondary | ICD-10-CM | POA: Diagnosis present

## 2023-12-05 DIAGNOSIS — K5900 Constipation, unspecified: Secondary | ICD-10-CM | POA: Diagnosis not present

## 2023-12-05 DIAGNOSIS — I2489 Other forms of acute ischemic heart disease: Secondary | ICD-10-CM | POA: Diagnosis present

## 2023-12-05 DIAGNOSIS — I428 Other cardiomyopathies: Secondary | ICD-10-CM | POA: Diagnosis present

## 2023-12-05 DIAGNOSIS — I4510 Unspecified right bundle-branch block: Secondary | ICD-10-CM | POA: Diagnosis present

## 2023-12-05 DIAGNOSIS — T8089XA Other complications following infusion, transfusion and therapeutic injection, initial encounter: Secondary | ICD-10-CM | POA: Diagnosis not present

## 2023-12-05 DIAGNOSIS — D649 Anemia, unspecified: Secondary | ICD-10-CM | POA: Insufficient documentation

## 2023-12-05 DIAGNOSIS — B9620 Unspecified Escherichia coli [E. coli] as the cause of diseases classified elsewhere: Secondary | ICD-10-CM | POA: Diagnosis not present

## 2023-12-05 DIAGNOSIS — T380X5A Adverse effect of glucocorticoids and synthetic analogues, initial encounter: Secondary | ICD-10-CM | POA: Diagnosis not present

## 2023-12-05 DIAGNOSIS — I4891 Unspecified atrial fibrillation: Secondary | ICD-10-CM

## 2023-12-05 DIAGNOSIS — I4901 Ventricular fibrillation: Secondary | ICD-10-CM | POA: Diagnosis present

## 2023-12-05 DIAGNOSIS — G8929 Other chronic pain: Secondary | ICD-10-CM | POA: Diagnosis present

## 2023-12-05 DIAGNOSIS — Z713 Dietary counseling and surveillance: Secondary | ICD-10-CM

## 2023-12-05 DIAGNOSIS — I462 Cardiac arrest due to underlying cardiac condition: Secondary | ICD-10-CM | POA: Diagnosis present

## 2023-12-05 DIAGNOSIS — Z79899 Other long term (current) drug therapy: Secondary | ICD-10-CM

## 2023-12-05 DIAGNOSIS — I5023 Acute on chronic systolic (congestive) heart failure: Secondary | ICD-10-CM | POA: Diagnosis present

## 2023-12-05 DIAGNOSIS — Z90710 Acquired absence of both cervix and uterus: Secondary | ICD-10-CM

## 2023-12-05 DIAGNOSIS — N390 Urinary tract infection, site not specified: Secondary | ICD-10-CM

## 2023-12-05 DIAGNOSIS — Z781 Physical restraint status: Secondary | ICD-10-CM

## 2023-12-05 DIAGNOSIS — M7989 Other specified soft tissue disorders: Secondary | ICD-10-CM | POA: Insufficient documentation

## 2023-12-05 DIAGNOSIS — R570 Cardiogenic shock: Secondary | ICD-10-CM | POA: Diagnosis present

## 2023-12-05 DIAGNOSIS — I2722 Pulmonary hypertension due to left heart disease: Secondary | ICD-10-CM | POA: Diagnosis present

## 2023-12-05 DIAGNOSIS — E1165 Type 2 diabetes mellitus with hyperglycemia: Secondary | ICD-10-CM | POA: Diagnosis not present

## 2023-12-05 DIAGNOSIS — Z91040 Latex allergy status: Secondary | ICD-10-CM

## 2023-12-05 DIAGNOSIS — Z7951 Long term (current) use of inhaled steroids: Secondary | ICD-10-CM

## 2023-12-05 DIAGNOSIS — Z87891 Personal history of nicotine dependence: Secondary | ICD-10-CM

## 2023-12-05 DIAGNOSIS — E872 Acidosis, unspecified: Secondary | ICD-10-CM

## 2023-12-05 DIAGNOSIS — Z885 Allergy status to narcotic agent status: Secondary | ICD-10-CM

## 2023-12-05 DIAGNOSIS — Z6841 Body Mass Index (BMI) 40.0 and over, adult: Secondary | ICD-10-CM

## 2023-12-05 DIAGNOSIS — J9601 Acute respiratory failure with hypoxia: Secondary | ICD-10-CM

## 2023-12-05 DIAGNOSIS — E874 Mixed disorder of acid-base balance: Secondary | ICD-10-CM | POA: Diagnosis present

## 2023-12-05 LAB — I-STAT VENOUS BLOOD GAS, ED
Acid-base deficit: 2 mmol/L (ref 0.0–2.0)
Bicarbonate: 17.7 mmol/L — ABNORMAL LOW (ref 20.0–28.0)
Calcium, Ion: 1.11 mmol/L — ABNORMAL LOW (ref 1.15–1.40)
HCT: 42 % (ref 36.0–46.0)
Hemoglobin: 14.3 g/dL (ref 12.0–15.0)
O2 Saturation: 99 %
Potassium: 3.6 mmol/L (ref 3.5–5.1)
Sodium: 139 mmol/L (ref 135–145)
TCO2: 18 mmol/L — ABNORMAL LOW (ref 22–32)
pCO2, Ven: 18.8 mm[Hg] — CL (ref 44–60)
pH, Ven: 7.58 — ABNORMAL HIGH (ref 7.25–7.43)
pO2, Ven: 117 mm[Hg] — ABNORMAL HIGH (ref 32–45)

## 2023-12-05 LAB — URINALYSIS, ROUTINE W REFLEX MICROSCOPIC
Bilirubin Urine: NEGATIVE
Glucose, UA: NEGATIVE mg/dL
Hgb urine dipstick: NEGATIVE
Ketones, ur: 5 mg/dL — AB
Leukocytes,Ua: NEGATIVE
Nitrite: POSITIVE — AB
Protein, ur: NEGATIVE mg/dL
Specific Gravity, Urine: 1.014 (ref 1.005–1.030)
pH: 8 (ref 5.0–8.0)

## 2023-12-05 LAB — TROPONIN I (HIGH SENSITIVITY): Troponin I (High Sensitivity): 161 ng/L (ref <18)

## 2023-12-05 LAB — I-STAT CG4 LACTIC ACID, ED: Lactic Acid, Venous: 2.5 mmol/L (ref 0.5–1.9)

## 2023-12-05 LAB — CBC WITH DIFFERENTIAL/PLATELET
Abs Immature Granulocytes: 0.02 10*3/uL (ref 0.00–0.07)
Basophils Absolute: 0.1 10*3/uL (ref 0.0–0.1)
Basophils Relative: 1 %
Eosinophils Absolute: 0.2 10*3/uL (ref 0.0–0.5)
Eosinophils Relative: 2 %
HCT: 43.3 % (ref 36.0–46.0)
Hemoglobin: 14.2 g/dL (ref 12.0–15.0)
Immature Granulocytes: 0 %
Lymphocytes Relative: 18 %
Lymphs Abs: 1.5 10*3/uL (ref 0.7–4.0)
MCH: 29.8 pg (ref 26.0–34.0)
MCHC: 32.8 g/dL (ref 30.0–36.0)
MCV: 90.8 fL (ref 80.0–100.0)
Monocytes Absolute: 0.6 10*3/uL (ref 0.1–1.0)
Monocytes Relative: 7 %
Neutro Abs: 6 10*3/uL (ref 1.7–7.7)
Neutrophils Relative %: 72 %
Platelets: 261 10*3/uL (ref 150–400)
RBC: 4.77 MIL/uL (ref 3.87–5.11)
RDW: 13.7 % (ref 11.5–15.5)
WBC: 8.3 10*3/uL (ref 4.0–10.5)
nRBC: 0 % (ref 0.0–0.2)

## 2023-12-05 LAB — COMPREHENSIVE METABOLIC PANEL
ALT: 38 U/L (ref 0–44)
AST: 77 U/L — ABNORMAL HIGH (ref 15–41)
Albumin: 3.8 g/dL (ref 3.5–5.0)
Alkaline Phosphatase: 111 U/L (ref 38–126)
Anion gap: 9 (ref 5–15)
BUN: 14 mg/dL (ref 6–20)
CO2: 22 mmol/L (ref 22–32)
Calcium: 9.8 mg/dL (ref 8.9–10.3)
Chloride: 107 mmol/L (ref 98–111)
Creatinine, Ser: 0.87 mg/dL (ref 0.44–1.00)
GFR, Estimated: >60 mL/min (ref >60)
Glucose, Bld: 166 mg/dL — ABNORMAL HIGH (ref 70–99)
Potassium: 4.6 mmol/L (ref 3.5–5.1)
Sodium: 138 mmol/L (ref 135–145)
Total Bilirubin: 0.8 mg/dL (ref 0.0–1.2)
Total Protein: 7.1 g/dL (ref 6.5–8.1)

## 2023-12-05 LAB — ETHANOL: Alcohol, Ethyl (B): <10 mg/dL (ref <10)

## 2023-12-05 LAB — CBG MONITORING, ED: Glucose-Capillary: 143 mg/dL — ABNORMAL HIGH (ref 70–99)

## 2023-12-05 LAB — RAPID URINE DRUG SCREEN, HOSP PERFORMED
Amphetamines: NOT DETECTED
Barbiturates: NOT DETECTED
Benzodiazepines: NOT DETECTED
Cocaine: NOT DETECTED
Opiates: NOT DETECTED
Tetrahydrocannabinol: POSITIVE — AB

## 2023-12-05 LAB — TSH: TSH: 2.035 u[IU]/mL (ref 0.350–4.500)

## 2023-12-05 LAB — AMMONIA: Ammonia: 19 umol/L (ref 9–35)

## 2023-12-05 LAB — VITAMIN B12: Vitamin B-12: 673 pg/mL (ref 180–914)

## 2023-12-05 MED ORDER — ISOSORBIDE MONONITRATE ER 30 MG PO TB24: 60.0000 mg | ORAL_TABLET | Freq: Every day | ORAL | Status: DC

## 2023-12-05 MED ORDER — PROCHLORPERAZINE MALEATE 5 MG PO TABS: 5.0000 mg | ORAL_TABLET | Freq: Four times a day (QID) | ORAL | Status: DC | PRN

## 2023-12-05 MED ORDER — MIDAZOLAM HCL 2 MG/2ML IJ SOLN
2.0000 mg | Freq: Once | INTRAMUSCULAR | Status: DC | PRN
  Filled 2023-12-05: qty 2

## 2023-12-05 MED ORDER — SENNA 8.6 MG PO TABS: 1.0000 | ORAL_TABLET | Freq: Every day | ORAL | Status: DC

## 2023-12-05 MED ORDER — POLYETHYLENE GLYCOL 3350 17 G PO PACK: 17.0000 g | PACK | Freq: Every day | ORAL | Status: DC

## 2023-12-05 MED ORDER — SODIUM CHLORIDE 0.9 % IV BOLUS
1000.0000 mL | Freq: Once | INTRAVENOUS | Status: AC
  Administered 2023-12-05: 1000 mL via INTRAVENOUS

## 2023-12-05 MED ORDER — ADULT MULTIVITAMIN W/MINERALS CH
1.0000 | ORAL_TABLET | Freq: Every day | ORAL | Status: DC
Start: 2023-12-06 — End: 2023-12-06
  Administered 2023-12-06: 1 via ORAL
  Filled 2023-12-05: qty 1

## 2023-12-05 MED ORDER — DIPHENHYDRAMINE HCL 50 MG/ML IJ SOLN
25.0000 mg | Freq: Once | INTRAMUSCULAR | Status: AC
Start: 2023-12-05 — End: 2023-12-05
  Administered 2023-12-05: 25 mg via INTRAVENOUS
  Filled 2023-12-05: qty 1

## 2023-12-05 MED ORDER — SODIUM CHLORIDE 0.9 % IV SOLN
2.0000 g | Freq: Once | INTRAVENOUS | Status: AC
  Administered 2023-12-05 – 2023-12-06 (×2): 2 g via INTRAVENOUS
  Filled 2023-12-05: qty 20

## 2023-12-05 MED ORDER — ENOXAPARIN SODIUM 40 MG/0.4ML IJ SOSY
40.0000 mg | PREFILLED_SYRINGE | INTRAMUSCULAR | Status: DC
  Administered 2023-12-05: 40 mg via SUBCUTANEOUS
  Filled 2023-12-05: qty 0.4

## 2023-12-05 MED ORDER — ACETAMINOPHEN 325 MG PO TABS
650.0000 mg | ORAL_TABLET | Freq: Four times a day (QID) | ORAL | Status: DC | PRN
  Administered 2023-12-06: 650 mg via ORAL
  Filled 2023-12-05: qty 2

## 2023-12-05 MED ORDER — PROCHLORPERAZINE EDISYLATE 10 MG/2ML IJ SOLN
10.0000 mg | Freq: Once | INTRAMUSCULAR | Status: AC
  Administered 2023-12-05: 10 mg via INTRAVENOUS
  Filled 2023-12-05: qty 2

## 2023-12-05 NOTE — Progress Notes (Signed)
EEG complete - results pending.  LG/MK/GMD

## 2023-12-05 NOTE — Assessment & Plan Note (Signed)
AST 77. Likely alcohol vx fatty liver contributing. -Hepatitis panel -CMP

## 2023-12-05 NOTE — ED Triage Notes (Signed)
Pt bib ems with intermittent episodes of palpitations and vertigo. Pt currently not on anticoags. Pt found to be in afib with ems, no known hisotry. Peaked t waves on EKG. Pt self converted to NSR en route.  Given 100cc NS en route.  Radial pulses are thready per ems.  CBG 191 99% RA

## 2023-12-05 NOTE — Assessment & Plan Note (Addendum)
Sudden onset confusion 8am 12/05/23 with collapse, unknown LOC. CT head negative for acute pathology. CXR negative for acute abnormality. Lactic acid 2.5. UDS positive for THC. ETOH<10. Ammonia 19. U/A positive for nitrites, s/p Rocephin x1 dose in the ED. Suspect this could be a seizure given reported story, however she does not have a history of seizures.  Also concern for stroke/TIA, will await MRI read. - Admit to FMTS, Dr. Pollie Meyer attending - Med-telemetry - MRI brain pending - EEG - Labs for reversible causes: RPR, B12, folate, TSH - HIV - Carboxyhemoglobin - troponins - trend lactate - Urine culture - Blood cultures - NPO until passes bedside swallow - Cardiac telemetry - Caution CNS acting medications - Lovenox for VTE ppx - CIWAs - Delirium, fall, seizure precautions - AM CMP, CBC

## 2023-12-05 NOTE — Hospital Course (Addendum)
Deanna Scott is a 53 y.o.female with a history of migraines, HTN, chronic dizziness, CSF leak who was admitted to the [facility] Teaching [facility] at [facility] for AMS. Her hospital course is detailed below:  Altered Mental Status/VT, VF Cardiac Arrest Presented initially for sudden onset of confusion with collapse.  Had chest pain, shortness of breath, vomiting, and incontinence prior to falling.  Remained confused for around 24 hours after that incident.  Urinalysis in the ED was initially concerning for UTI, patient was given 1 dose of Rocephin in the ED however this was not continued as she was asymptomatic and we were waiting on urine cultures.  MRI brain unremarkable, EEG unremarkable, CT head unremarkable, chest x-ray unremarkable.  UDS positive for THC, ammonia 19, VBG with pH 7.5.  Alcohol level negative. Troponin was initially elevated to 161>136>118.  Patient developed new T wave inversions on EKG morning of 1/30 and had intermittent chest pain with exertion that morning.  Cardiology was consulted.  We started a heparin drip, gave her 325 mg aspirin, and started statin.  A few hours later, patient went into cardiac arrest after getting up to go to the bathroom.  It was noted that she may have seized prior to arresting and went into complete heart block. After multiple rounds of ACLS ROSC was obtained and patient was sent to the ICU intubated. Patient was sent to the Cath Lab for emergent cath.  Left heart cath showed nonischemic cardiomyopathy, very mild inferoseptal hypokinesis with normal EDP, EF approximately 45%.  TEE with no dissection, EF 40-45 with septal akinesis.  Around 4 hours after her first cardiac arrest pt had torsades/recurrent VF d/t R on T phenomenon and had another cardiac arrest.  ROSC was obtained.  Patient had electrolyte abnormalities including hypokalemia prior to this arrest.  She then had a right heart cath where they placed right internal jugular temporary pacing wire,  showed biventricular elevated filling pressures, severely reduced cardiac index.  Etiology unclear as of 1/31. Was started on steroids empirically for potentially myocarditis.   A-fib Concerned that patient was in atrial fibrillation while en route to the ED on the ambulance.  It seems if she was in A-fib that she converted to normal sinus rhythm on arrival to the ED and did not enter that rhythm again.  You can see the strip in patient's H&P note.  Other chronic conditions were medically managed with home medications and formulary alternatives as necessary  PCP Follow-up Recommendations: Follow up with surgeon at [facility] regarding small amount of fluid in sinus found on MRI F/u bloody stools pt intermittently had prior to admission

## 2023-12-05 NOTE — Assessment & Plan Note (Addendum)
VBG 7.58, CO2 18.8, bicarb 17.7. Suspect in setting of pt's new encephalitis vs potential stroke. No hypoxia or respiratory issues on exam. Ammonia normal, LFTs unremarkable, will obtain hepatitis panel. - F/u MRI - Continue to monitor

## 2023-12-05 NOTE — H&P (Addendum)
Hospital Admission History and Physical Service Pager: [phone]  Patient name: Deanna Scott Medical record number: [id number] Date of Birth: 02/20/1971 Age: 53 y.o. Gender: female  Primary Care Provider: Darral Dash, DO Consultants: none Code Status: FULL Preferred Emergency Contact:   Name Relation Home Work Mobile   [name] Other [phone]     [name], [name] Daughter [phone]        Chief Complaint: AMS  Assessment and Plan: Deanna Scott is a 53 y.o. female PMH migraines, HTN, chronic dizziness (unclear cause), and CSF leak with endoscopic repair in 2021, urinary incontinence s/p suburethral sling placement  presenting with altered mental status. Differential for presentation of this includes:  Seizure: Story sounds seizure-like especially given loss of bowel and bladder control, however pt is 52 with no hx of seizures. Will obtain EEG Stroke/TIA: Concerning given new AMS and speech changes in the ED, although CT head without acute changes. Reassuringly no focal neurological deficits and speech almost back to baseline on our exam. Will await MRI brain results.  ACS: Pt reports chest pain, vomiting prior to episode, however EKG normal. Will obtain troponins. Less likely given her acute confusion and incontinence. Of note, on chart review it appears pt has a history of chest tightness of unknown etiology, and had L heart cath in 06/2022 that was normal.  Meningitis/Encephalitis: Less likely given pt is afebrile, no leukocytosis, no nuchal rigidity, and seems her mentation has improved significantly since being in the ED Overdose/Sedation/Alcohol Use: UDS reassuringly only + for THC, pt denies taking excess of pills/medications. Denies frequent alcohol use (last drink last night) and no history of withdrawal but will add CIWA Endocrine causes: will obtain TSH, A1C 6.6 last month Arrhythmia: possible given concern for atrial fibrillation by EMS, however has been  in NSR with completely normal EKG in the ED, will place on cardiac telemetry Electrolyte abnormality: electrolytes within normal limits thus far Acidosis: VBG with respiratory alkalosis rather than acidosis, BMP within normal limits Trauma: less likely, CT head negative, although pt is unsure if she hit her head when she fell Uremia: reassuringly overall normal labwork in ED including BMP Psychiatric causes: less likely given no reported psychiatric history Assessment & Plan Altered mental status Sudden onset confusion 8am 12/05/23 with collapse, unknown LOC. CT head negative for acute pathology. CXR negative for acute abnormality. Lactic acid 2.5. UDS positive for THC. ETOH<10. Ammonia 19. U/A positive for nitrites, s/p Rocephin x1 dose in the ED. Suspect this could be a seizure given reported story, however she does not have a history of seizures.  Also concern for stroke/TIA, will await MRI read. - Admit to FMTS, Dr. Pollie Meyer attending - Med-telemetry - MRI brain pending - EEG - Labs for reversible causes: RPR, B12, folate, TSH - HIV - Carboxyhemoglobin - troponins - trend lactate - Urine culture - Blood cultures - NPO until passes bedside swallow - Cardiac telemetry - Caution CNS acting medications - Lovenox for VTE ppx - CIWAs - Delirium, fall, seizure precautions - AM CMP, CBC Urinary tract infection Urinalysis with positive nitrites, although 11-20 epithelial cells. Treated with Rocephin x1 dose in the ED. Daughters report pt has been complaining of some urinary frequency lately but no dysuria or hematuria. Pt denies urinary sx.  - Urine culture - Reassess need for antibiotics Acute respiratory alkalosis VBG 7.58, CO2 18.8, bicarb 17.7. Suspect in setting of pt's new encephalitis vs potential stroke. No hypoxia or respiratory issues on exam. Ammonia normal, LFTs unremarkable, will obtain  hepatitis panel. - F/u MRI - Continue to monitor Transaminitis AST 77. Likely alcohol vx  fatty liver contributing. -Hepatitis panel -CMP New onset a-fib (HCC) Afib in EMS EKG, converted to NSR here. No therapeutic anticoagulation indicated at this time. -Monitor on tele -Echo   Chronic and Stable Problems:  HTN - takes Imdur 60mg  daily, will continue tomorrow Migraines - no rx medications at home other than PRN Excedrin GERD - reports not taking home meds BPPV - reports not taking home meds HLD - reports not taking home meds Hx of bloody stools - none this AM, Colonoscopy 2021 with non-cancerous polyps, likely from hemmorhoids, trend CBC  FEN/GI: NPO until passes bedside swallow VTE Prophylaxis: lovenox  Disposition: med-telemetry  History of Present Illness:  Deanna Scott is a 53 y.o. female presenting with altered mental status. Pt was brought in by EMS for dizziness, ED physician reported that pt was confused with broken speech on arrival. He was unable to obtain history from her.   Per daughter and patient she had a sudden onset of confusion, dizziness, and a fall at 8 AM this morning. The episode was characterized by an abrupt change in mental status, with the patient speaking gibberish (patient was on phone with daughter) and exhibiting unusual behavior, such as pushing away family members.  Daughter states that she heard the phone drop and assumes her mom collapsed at that time.  Patient is unsure if she lost consciousness or hit her head, fall was unwitnessed.  However patient lives with her mother who called EMS. Daughters report that pt had bowel and bladder incontinence after the fall.   She also experienced chest tightness, some SOB, and lightheadedness prior to the fall, and reported feeling generally unwell. She also vomited once this morning and experienced belching.  The daughters report that she has been having issues with frequent urination and has been experiencing blood in her stool for an unspecified duration from her hemmorhoids but no blood in stool  this AM. Daughters report she has had nasal congestion for months. Daughter recently had RSV and strep, no other sick exposures for pt.  Of note, patient has a history of urinary incontinence and had bladder sling operation. Incontinence improved but she sometimes has trouble making it to the bathroom. Has never lost complete control of her bladder without feeling the urge to go.    She also has a history of CSF leak in her nose, which was surgically repaired in 2021 with no clear fluid leaks recently. The patient lives with her grandmother and takes one medication daily for her blood pressure, identified as Imdur which she denied taking this AM. She also occasionally uses marijuana and drinks alcohol. Last drank last night, last use of marijuana last night.   No headaches No recent illness No dysuria, no hematuria No fevers  In the ED, pt received one dose of Rocephin for U/A with positive nitrites. S/p 1L NS bolus, Compazine 10mg , and Benadryl 25mg . CT head negative for acute pathology, CXR unremarkable. UDS + THC. VBG with pH 7.58, CO2 18.8. Labs otherwise overall unremarkable.   Review Of Systems: Per HPI with the following additions: none  Pertinent Past Medical History: Migraines GERD Transnasal CSF leak 2021  Remainder reviewed in history tab.   Pertinent Past Surgical History: Endoscopic surgical repair of transnasal CSF leak - [facility] 2021 Abdominal hysterectomy Left heart cath - 2023   Remainder reviewed in history tab.  Pertinent Social History: Tobacco use: Former Alcohol use:  states occasional, no hx of withdrawal Other Substance use: marijuana occasional (last night) Lives with her mother  Pertinent Family History: Mother - colon cancer Father - cancer  Remainder reviewed in history tab.   Important Outpatient Medications: Multiple medications listed however pt only takes Imdur 60mg  daily  Remainder reviewed in medication history.   Objective: BP  124/71   Pulse 61   Temp 98.4 F (36.9 C)   Resp (!) 22   LMP 03/02/2012   SpO2 98%  Exam: General: No acute distress, resting comfortably Eyes: PERRL, EOMI ENTM: Moist mucous membranes, no sinus tenderness to palpation, no nasal congestion Neck: No nuchal rigidity, able to flex chin to chest and turn head side-to-side Cardiovascular: Regular rate and rhythm, no m/r/g Respiratory: Normal work of breathing on room air.  CTAB. Gastrointestinal: Normal bowel sounds, soft, nontender MSK: 5 out of 5 strength bilateral upper extremities and bilateral lower extremities.  No leg swelling bilateral lower extremities. Neuro: Alert and oriented to person, place, time, situation.  Normal finger-nose testing bilaterally.  No focal weakness. CN2: no vision changes CN3,4,6: PERRLA. EOMI CN5: Sensation intact BL CN7: Facial expressions symmetric CN8: Hearing intact BL CN9: palate symmetric  CN10: regular speech, daughters report it is slowed CN11: turns head against resistance CN12: tongue midline Psych: Normal affect  Labs:  CBC BMET  Recent Labs  Lab 12/05/23 1003 12/05/23 1140  WBC 8.3  --   HGB 14.2 14.3  HCT 43.3 42.0  PLT 261  --    Recent Labs  Lab 12/05/23 1003 12/05/23 1140  NA 138 139  K 4.6 3.6  CL 107  --   CO2 22  --   BUN 14  --   CREATININE 0.87  --   GLUCOSE 166*  --   CALCIUM 9.8  --      UA with positive nitrites UDS pending Ammonia 19 VBG: pH 7.58, CO2 18.8, bicarb 17.7 ETOH <10  EKG: Normal sinus rhythm at 69BPM. Qtc . No ST-T changes. Normal axis.    Imaging Studies Performed:  CXR 12/05/23 IMPRESSION: Low lung volumes.  No acute cardiopulmonary findings.  CT Head WO Contrast 12/05/23 IMPRESSION: No evidence of acute intracranial abnormality. 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  Everhart, Kirstie, DO 12/05/2023, 3:41 PM PGY-1, [facility]  FPTS Intern pager: [phone], text pages welcome Secure chat group [facility] Teaching Service   Upper Level Addendum:  I have seen and evaluated this patient along with Dr. Rexene Alberts and reviewed the above note, making necessary revisions as appropriate.  I agree with the medical decision making and physical exam as noted above.  Levin Erp, MD PGY-3 [facility] Family Medicine Residency

## 2023-12-05 NOTE — ED Provider Notes (Signed)
[place] [facility] AT [facility] Provider Note   CSN: [id number] Arrival date & time: 12/05/23  7829     History  Chief Complaint  Patient presents with   Palpitations   Dizziness    Kassi Esteve is a 53 y.o. female.  53 yo F with a chief complaints of dizziness.  This was reported by EMS.  She is unable to provide any history to me.  Seems aphasic.  No obvious history of same.  Unsure of onset.   Palpitations Associated symptoms: dizziness   Dizziness Associated symptoms: palpitations        Home Medications Prior to Admission medications   Medication Sig Start Date End Date Taking? Authorizing Provider  acetaminophen (TYLENOL 8 HOUR) 650 MG CR tablet Take 2 tablets (1,300 mg total) by mouth every 8 (eight) hours as needed for pain. 06/16/19   Arlyce Harman, MD  albuterol (VENTOLIN HFA) 108 (90 Base) MCG/ACT inhaler Inhale 1 puff into the lungs every 4 (four) hours as needed for wheezing or shortness of breath. 09/19/21   Simmons-Robinson, Tawanna Cooler, MD  aspirin-acetaminophen-caffeine (EXCEDRIN MIGRAINE) [phone] MG per tablet Take 1 tablet by mouth every 6 (six) hours as needed for pain or migraine.    [provider]  Blood Pressure KIT Check blood pressure 2-3 times week 09/05/21   Dana Allan, MD  cetirizine (ZYRTEC ALLERGY) 10 MG tablet Take 1 tablet (10 mg total) by mouth daily. 04/29/23   Wallis Bamberg, PA-C  diclofenac (VOLTAREN) 50 MG EC tablet Take 1 tablet (50 mg total) by mouth 2 (two) times daily. 03/13/22   Vallery Sa, Amy L, PA  fluconazole (DIFLUCAN) 150 MG tablet Take 1 tablet (150 mg total) by mouth daily. 04/25/23   Radford Pax, NP  fluticasone (FLONASE) 50 MCG/ACT nasal spray Place 1 spray into both nostrils daily. 09/21/23   Alfredo Martinez, MD  fluticasone-salmeterol (ADVAIR HFA) 578-46 MCG/ACT inhaler Inhale 2 puffs into the lungs 2 (two) times daily. 09/19/21   Simmons-Robinson, Tawanna Cooler, MD  isosorbide mononitrate (IMDUR)  60 MG 24 hr tablet Take 1 tablet (60 mg total) by mouth daily. 09/21/23 12/20/23  Alfredo Martinez, MD  loratadine (CLARITIN) 10 MG tablet Take 10 mg by mouth daily as needed for allergies.    [provider]  meclizine (ANTIVERT) 25 MG tablet Take 1 tablet (25 mg total) by mouth 3 (three) times daily as needed for dizziness. 09/21/23   Alfredo Martinez, MD  Multiple Vitamin (MULTIVITAMIN ADULT PO) Take 1 tablet by mouth daily.    [provider]  nitroGLYCERIN (NITROSTAT) 0.4 MG SL tablet Place 1 tablet (0.4 mg total) under the tongue every 5 (five) minutes as needed for chest pain. 06/09/22 09/07/22  Chilton Si, MD  ondansetron (ZOFRAN-ODT) 4 MG disintegrating tablet Take 1 tablet (4 mg total) by mouth every 8 (eight) hours as needed for nausea or vomiting. 04/25/23   Radford Pax, NP  pantoprazole (PROTONIX) 40 MG tablet Take 1 tablet (40 mg total) by mouth daily. 06/23/23   Alicia Amel, MD  polyethylene glycol powder (GLYCOLAX/MIRALAX) 17 GM/SCOOP powder Take 17 g by mouth daily as needed for moderate constipation. 01/15/20   [provider]  predniSONE (DELTASONE) 20 MG tablet Take 2 tablets daily with breakfast. 04/29/23   Wallis Bamberg, PA-C  rosuvastatin (CRESTOR) 10 MG tablet Take 1 tablet (10 mg total) by mouth 3 (three) times a week. 10/26/23 10/24/24  Dameron, Nolberto Hanlon, DO  venlafaxine XR (EFFEXOR XR) 37.5 MG 24  hr capsule Take 1 capsule by mouth daily for one week, then increase to 2 capsules daily. 10/19/23   Dameron, Nolberto Hanlon, DO  vitamin B-12 (CYANOCOBALAMIN) 500 MCG tablet Take 500 mcg by mouth 3 (three) times a week.    [provider]      Allergies    Latex, Hydrocodone-acetaminophen, and Oxycodone-acetaminophen    Review of Systems   Review of Systems  Cardiovascular:  Positive for palpitations.  Neurological:  Positive for dizziness.    Physical Exam Updated Vital Signs BP (!) 126/41   Pulse 61   Temp 98.4 F (36.9 C)   Resp (!) 22    LMP 03/02/2012   SpO2 98%  Physical Exam Vitals and nursing note reviewed.  Constitutional:      General: She is not in acute distress.    Appearance: She is well-developed. She is not diaphoretic.     Comments: Disheveled  HENT:     Head: Normocephalic and atraumatic.  Eyes:     Pupils: Pupils are equal, round, and reactive to light.  Cardiovascular:     Rate and Rhythm: Normal rate and regular rhythm.     Heart sounds: No murmur heard.    No friction rub. No gallop.  Pulmonary:     Effort: Pulmonary effort is normal.     Breath sounds: No wheezing or rales.  Abdominal:     General: There is no distension.     Palpations: Abdomen is soft.     Tenderness: There is no abdominal tenderness.  Musculoskeletal:        General: No tenderness.     Cervical back: Normal range of motion and neck supple.  Skin:    General: Skin is warm and dry.  Neurological:     Mental Status: She is alert.     Cranial Nerves: Cranial nerves 2-12 are intact.     Sensory: Sensation is intact.     Motor: Motor function is intact.     Coordination: Coordination is intact.     Comments: Patient has some slurred and garbled speech.  She has no obvious focal deficit otherwise.  Psychiatric:        Behavior: Behavior normal.     ED Results / Procedures / Treatments   Labs (all labs ordered are listed, but only abnormal results are displayed) Labs Reviewed  COMPREHENSIVE METABOLIC PANEL - Abnormal; Notable for the following components:      Result Value   Glucose, Bld 166 (*)    AST 77 (*)    All other components within normal limits  URINALYSIS, ROUTINE W REFLEX MICROSCOPIC - Abnormal; Notable for the following components:   APPearance HAZY (*)    Ketones, ur 5 (*)    Nitrite POSITIVE (*)    Bacteria, UA MANY (*)    All other components within normal limits  CBG MONITORING, ED - Abnormal; Notable for the following components:   Glucose-Capillary 143 (*)    All other components within normal  limits  I-STAT CG4 LACTIC ACID, ED - Abnormal; Notable for the following components:   Lactic Acid, Venous 2.5 (*)    All other components within normal limits  I-STAT VENOUS BLOOD GAS, ED - Abnormal; Notable for the following components:   pH, Ven 7.580 (*)    pCO2, Ven 18.8 (*)    pO2, Ven 117 (*)    Bicarbonate 17.7 (*)    TCO2 18 (*)    Calcium, Ion 1.11 (*)  All other components within normal limits  AMMONIA  ETHANOL  CBC WITH DIFFERENTIAL/PLATELET  RAPID URINE DRUG SCREEN, HOSP PERFORMED    EKG EKG Interpretation Date/Time:  Wednesday December 05 2023 09:57:55 EST Ventricular Rate:  69 PR Interval:  146 QRS Duration:  80 QT Interval:  408 QTC Calculation: 437 R Axis:   42  Text Interpretation: Normal sinus rhythm Normal ECG No significant change since last tracing Confirmed by Melene Plan [phone]) on 12/05/2023 10:52:07 AM  Radiology CT HEAD WO CONTRAST Result Date: 12/05/2023 CLINICAL DATA:  Mental status change, persistent or worsening. EXAM: CT HEAD WITHOUT CONTRAST TECHNIQUE: Contiguous axial images were obtained from the base of the skull through the vertex without intravenous contrast. RADIATION DOSE REDUCTION: This exam was performed according to the departmental dose-optimization program which includes automated exposure control, adjustment of the mA and/or kV according to patient size and/or use of iterative reconstruction technique. COMPARISON:  Head MRI 02/12/2021 FINDINGS: Brain: There is no evidence of an acute infarct, intracranial hemorrhage, mass, midline shift, or extra-axial fluid collection. Cerebral volume is normal. The ventricles are normal in size. Vascular: No hyperdense vessel. Skull: No acute fracture. 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 Sinuses/Orbits: The included portions of the paranasal sinuses and  mastoid air cells are clear. Unremarkable orbits. Other: None. IMPRESSION: No evidence of acute intracranial abnormality. Electronically Signed   By: Sebastian Ache M.D.   On: 12/05/2023 11:42   DG Chest 1 View Result Date: 12/05/2023 CLINICAL DATA:  Altered mental status. EXAM: CHEST  1 VIEW COMPARISON:  Chest radiograph dated June 06, 2022. FINDINGS: Low lung volumes. The heart size and mediastinal contours are within normal limits. No focal consolidation, pleural effusion, or pneumothorax. No acute osseous abnormality. IMPRESSION: Low lung volumes.  No acute cardiopulmonary findings. Electronically Signed   By: Hart Robinsons M.D.   On: 12/05/2023 10:58    Procedures .Critical Care  Performed by: Melene Plan, DO Authorized by: Melene Plan, DO   Critical care provider statement:    Critical care time (minutes):  35   Critical care time was exclusive of:  Separately billable procedures and treating other patients   Critical care was time spent personally by me on the following activities:  Development of treatment plan with patient or surrogate, discussions with consultants, evaluation of patient's response to treatment, examination of patient, ordering and review of laboratory studies, ordering and review of radiographic studies, ordering and performing treatments and interventions, pulse oximetry, re-evaluation of patient's condition and review of old charts   Care discussed with: admitting provider       Medications Ordered in ED Medications  cefTRIAXone (ROCEPHIN) 2 g in sodium chloride 0.9 % 100 mL IVPB (has no administration in time range)  sodium chloride 0.9 % bolus 1,000 mL (0 mLs Intravenous Stopped 12/05/23 1255)  prochlorperazine (COMPAZINE) injection 10 mg (10 mg Intravenous Given 12/05/23 1138)  diphenhydrAMINE (BENADRYL) injection 25 mg (25 mg Intravenous Given 12/05/23 1137)    ED Course/ Medical Decision Making/ A&P                                 Medical Decision  Making Amount and/or Complexity of Data Reviewed Labs: ordered. Radiology: ordered.  Risk Prescription drug management.   53 year old female with  a chief complaints of dizziness.  Entire history was obtained by EMS.  Patient seems to have difficulty communicating.  Not sure if this is baseline for her.  She has had months of dizziness per her record review.  Was seen by ENT yesterday.  Will obtain a laboratory evaluation and CT of the head.  CT of the head is negative for acute intracranial pathology.  Chest x-ray independently interpreted by me without focal infiltrate or pneumothorax.  Lab work is largely unremarkable.  She has a mild elevation in her lactate level.  VBG without hypercarbia.  No significant electrolyte abnormalities.  UA with possible urinary tract infection.  This would be unlikely to make a 53 year old encephalopathic.  I discussed the case with family medicine for admission.  Will order an MRI to evaluate possible stroke.  I obtained further history from the family.  The initial story that I got was that she was last known normal last night.  It sounds like the family actually had spoke with her on the phone this morning and thought that she was well until just prior to EMS being called.  States she felt bad and then dropped the phone.  I do not appreciate any obvious focal neurologic deficit on repeat exam.  Continues to be more consistent with encephalopathy.   The patients results and plan were reviewed and discussed.   Any x-rays performed were independently reviewed by myself.   Differential diagnosis were considered with the presenting HPI.  Medications  cefTRIAXone (ROCEPHIN) 2 g in sodium chloride 0.9 % 100 mL IVPB (has no administration in time range)  sodium chloride 0.9 % bolus 1,000 mL (0 mLs Intravenous Stopped 12/05/23 1255)  prochlorperazine (COMPAZINE) injection 10 mg (10 mg Intravenous Given 12/05/23 1138)  diphenhydrAMINE (BENADRYL) injection 25  mg (25 mg Intravenous Given 12/05/23 1137)    Vitals:   12/05/23 0917  BP: (!) 126/41  Pulse: 61  Resp: (!) 22  Temp: 98.4 F (36.9 C)  SpO2: 98%    Final diagnoses:  Confusion  Acute cystitis without hematuria    Admission/ observation were discussed with the admitting physician, patient and/or family and they are comfortable with the plan.               Final Clinical Impression(s) / ED Diagnoses Final diagnoses:  Confusion  Acute cystitis without hematuria    Rx / DC Orders ED Discharge Orders     None         Melene Plan, DO 12/05/23 1323

## 2023-12-05 NOTE — Plan of Care (Signed)
Called and spoke with Dr. Suszanne Conners ENT about small amount of fluid in sphenoid sinus ENT exam for him yesterday normal Nothing to do acutely is CSF leak Recommended no heavy weight bearing Recommend stool softeners to not strain If actively pouring out CSF then recommend neurosurgery eval Recommended f/u with [facility] outpatient

## 2023-12-05 NOTE — ED Notes (Signed)
Went to MRI

## 2023-12-05 NOTE — ED Notes (Signed)
Attempted to draw labs twice by this RN. Phlebotomy notified.

## 2023-12-05 NOTE — Assessment & Plan Note (Addendum)
Urinalysis with positive nitrites, although 11-20 epithelial cells. Treated with Rocephin x1 dose in the ED. Daughters report pt has been complaining of some urinary frequency lately but no dysuria or hematuria. Pt denies urinary sx.  - Urine culture - Reassess need for antibiotics

## 2023-12-05 NOTE — Assessment & Plan Note (Addendum)
Afib in EMS EKG, converted to NSR here. No therapeutic anticoagulation indicated at this time. -Monitor on tele -Echo

## 2023-12-06 ENCOUNTER — Inpatient Hospital Stay (HOSPITAL_COMMUNITY): Payer: No Typology Code available for payment source

## 2023-12-06 ENCOUNTER — Observation Stay (HOSPITAL_COMMUNITY): Payer: No Typology Code available for payment source

## 2023-12-06 ENCOUNTER — Other Ambulatory Visit (HOSPITAL_COMMUNITY): Payer: No Typology Code available for payment source

## 2023-12-06 ENCOUNTER — Encounter (HOSPITAL_COMMUNITY)
Admission: EM | Disposition: A | Discharge status: Other Acute Inpatient Hospital | Payer: Self-pay | Source: Home / Self Care | Attending: Family Medicine

## 2023-12-06 ENCOUNTER — Inpatient Hospital Stay (HOSPITAL_COMMUNITY)
Admission: EM | Disposition: A | Discharge status: Other Acute Inpatient Hospital | Payer: Self-pay | Source: Home / Self Care | Attending: Family Medicine

## 2023-12-06 ENCOUNTER — Encounter (HOSPITAL_COMMUNITY): Payer: Self-pay | Admitting: Pulmonary Disease

## 2023-12-06 DIAGNOSIS — R531 Weakness: Secondary | ICD-10-CM | POA: Diagnosis not present

## 2023-12-06 DIAGNOSIS — A419 Sepsis, unspecified organism: Secondary | ICD-10-CM | POA: Diagnosis not present

## 2023-12-06 DIAGNOSIS — I214 Non-ST elevation (NSTEMI) myocardial infarction: Secondary | ICD-10-CM

## 2023-12-06 DIAGNOSIS — J9601 Acute respiratory failure with hypoxia: Secondary | ICD-10-CM | POA: Diagnosis present

## 2023-12-06 DIAGNOSIS — D649 Anemia, unspecified: Secondary | ICD-10-CM

## 2023-12-06 DIAGNOSIS — I462 Cardiac arrest due to underlying cardiac condition: Secondary | ICD-10-CM | POA: Diagnosis present

## 2023-12-06 DIAGNOSIS — G9340 Encephalopathy, unspecified: Secondary | ICD-10-CM | POA: Diagnosis not present

## 2023-12-06 DIAGNOSIS — I5023 Acute on chronic systolic (congestive) heart failure: Secondary | ICD-10-CM | POA: Diagnosis present

## 2023-12-06 DIAGNOSIS — E874 Mixed disorder of acid-base balance: Secondary | ICD-10-CM | POA: Diagnosis present

## 2023-12-06 DIAGNOSIS — I4729 Other ventricular tachycardia: Secondary | ICD-10-CM | POA: Diagnosis present

## 2023-12-06 DIAGNOSIS — R4182 Altered mental status, unspecified: Secondary | ICD-10-CM | POA: Diagnosis not present

## 2023-12-06 DIAGNOSIS — Z6841 Body Mass Index (BMI) 40.0 and over, adult: Secondary | ICD-10-CM | POA: Diagnosis not present

## 2023-12-06 DIAGNOSIS — I428 Other cardiomyopathies: Secondary | ICD-10-CM | POA: Diagnosis present

## 2023-12-06 DIAGNOSIS — R570 Cardiogenic shock: Secondary | ICD-10-CM | POA: Diagnosis present

## 2023-12-06 DIAGNOSIS — E8721 Acute metabolic acidosis: Secondary | ICD-10-CM

## 2023-12-06 DIAGNOSIS — G9349 Other encephalopathy: Secondary | ICD-10-CM | POA: Diagnosis present

## 2023-12-06 DIAGNOSIS — R569 Unspecified convulsions: Secondary | ICD-10-CM

## 2023-12-06 DIAGNOSIS — M7989 Other specified soft tissue disorders: Secondary | ICD-10-CM | POA: Diagnosis not present

## 2023-12-06 DIAGNOSIS — I5021 Acute systolic (congestive) heart failure: Secondary | ICD-10-CM | POA: Diagnosis not present

## 2023-12-06 DIAGNOSIS — E876 Hypokalemia: Secondary | ICD-10-CM | POA: Diagnosis not present

## 2023-12-06 DIAGNOSIS — I4901 Ventricular fibrillation: Secondary | ICD-10-CM

## 2023-12-06 DIAGNOSIS — N179 Acute kidney failure, unspecified: Secondary | ICD-10-CM | POA: Diagnosis present

## 2023-12-06 DIAGNOSIS — I82612 Acute embolism and thrombosis of superficial veins of left upper extremity: Secondary | ICD-10-CM | POA: Diagnosis not present

## 2023-12-06 DIAGNOSIS — J9602 Acute respiratory failure with hypercapnia: Secondary | ICD-10-CM | POA: Diagnosis present

## 2023-12-06 DIAGNOSIS — I2722 Pulmonary hypertension due to left heart disease: Secondary | ICD-10-CM | POA: Diagnosis present

## 2023-12-06 DIAGNOSIS — N3000 Acute cystitis without hematuria: Secondary | ICD-10-CM | POA: Diagnosis present

## 2023-12-06 DIAGNOSIS — I469 Cardiac arrest, cause unspecified: Secondary | ICD-10-CM

## 2023-12-06 DIAGNOSIS — I509 Heart failure, unspecified: Secondary | ICD-10-CM

## 2023-12-06 DIAGNOSIS — I110 Hypertensive heart disease with heart failure: Secondary | ICD-10-CM | POA: Diagnosis present

## 2023-12-06 DIAGNOSIS — I2489 Other forms of acute ischemic heart disease: Secondary | ICD-10-CM | POA: Diagnosis present

## 2023-12-06 DIAGNOSIS — I442 Atrioventricular block, complete: Secondary | ICD-10-CM | POA: Diagnosis present

## 2023-12-06 DIAGNOSIS — I493 Ventricular premature depolarization: Secondary | ICD-10-CM | POA: Diagnosis present

## 2023-12-06 DIAGNOSIS — I4721 Torsades de pointes: Secondary | ICD-10-CM | POA: Diagnosis present

## 2023-12-06 DIAGNOSIS — E1165 Type 2 diabetes mellitus with hyperglycemia: Secondary | ICD-10-CM | POA: Diagnosis not present

## 2023-12-06 DIAGNOSIS — I4891 Unspecified atrial fibrillation: Secondary | ICD-10-CM | POA: Diagnosis not present

## 2023-12-06 HISTORY — PX: TRANSESOPHAGEAL ECHOCARDIOGRAM (CATH LAB): EP1270

## 2023-12-06 HISTORY — PX: RIGHT HEART CATH: CATH118263

## 2023-12-06 HISTORY — PX: LEFT HEART CATH AND CORONARY ANGIOGRAPHY: CATH118249

## 2023-12-06 HISTORY — PX: TEMPORARY PACEMAKER: CATH118268

## 2023-12-06 HISTORY — PX: ARTERIAL LINE INSERTION: CATH118227

## 2023-12-06 HISTORY — PX: CENTRAL LINE INSERTION: CATH118232

## 2023-12-06 LAB — CBC
HCT: 38.4 % (ref 36.0–46.0)
HCT: 44.4 % (ref 36.0–46.0)
HCT: 40.5 % (ref 36.0–46.0)
Hemoglobin: 12.5 g/dL (ref 12.0–15.0)
Hemoglobin: 14.4 g/dL (ref 12.0–15.0)
Hemoglobin: 13.2 g/dL (ref 12.0–15.0)
MCH: 29.6 pg (ref 26.0–34.0)
MCH: 30 pg (ref 26.0–34.0)
MCH: 30.1 pg (ref 26.0–34.0)
MCHC: 32.6 g/dL (ref 30.0–36.0)
MCHC: 32.4 g/dL (ref 30.0–36.0)
MCHC: 32.6 g/dL (ref 30.0–36.0)
MCV: 91 fL (ref 80.0–100.0)
MCV: 92.5 fL (ref 80.0–100.0)
MCV: 92.3 fL (ref 80.0–100.0)
Platelets: 218 10*3/uL (ref 150–400)
Platelets: 352 10*3/uL (ref 150–400)
Platelets: 315 10*3/uL (ref 150–400)
RBC: 4.22 MIL/uL (ref 3.87–5.11)
RBC: 4.8 MIL/uL (ref 3.87–5.11)
RBC: 4.39 MIL/uL (ref 3.87–5.11)
RDW: 13.9 % (ref 11.5–15.5)
RDW: 14 % (ref 11.5–15.5)
RDW: 14.1 % (ref 11.5–15.5)
WBC: 10 10*3/uL (ref 4.0–10.5)
WBC: 24.9 10*3/uL — ABNORMAL HIGH (ref 4.0–10.5)
WBC: 45 10*3/uL — ABNORMAL HIGH (ref 4.0–10.5)
nRBC: 0 % (ref 0.0–0.2)
nRBC: 0 % (ref 0.0–0.2)
nRBC: 0 % (ref 0.0–0.2)

## 2023-12-06 LAB — COMPREHENSIVE METABOLIC PANEL
ALT: 24 U/L (ref 0–44)
ALT: 149 U/L — ABNORMAL HIGH (ref 0–44)
AST: 32 U/L (ref 15–41)
AST: 348 U/L — ABNORMAL HIGH (ref 15–41)
Albumin: 3.4 g/dL — ABNORMAL LOW (ref 3.5–5.0)
Albumin: 2.6 g/dL — ABNORMAL LOW (ref 3.5–5.0)
Alkaline Phosphatase: 82 U/L (ref 38–126)
Alkaline Phosphatase: 89 U/L (ref 38–126)
Anion gap: 12 (ref 5–15)
Anion gap: 15 (ref 5–15)
BUN: 12 mg/dL (ref 6–20)
BUN: 14 mg/dL (ref 6–20)
CO2: 19 mmol/L — ABNORMAL LOW (ref 22–32)
CO2: 21 mmol/L — ABNORMAL LOW (ref 22–32)
Calcium: 8.6 mg/dL — ABNORMAL LOW (ref 8.9–10.3)
Calcium: 8.7 mg/dL — ABNORMAL LOW (ref 8.9–10.3)
Chloride: 104 mmol/L (ref 98–111)
Chloride: 107 mmol/L (ref 98–111)
Creatinine, Ser: 0.74 mg/dL (ref 0.44–1.00)
Creatinine, Ser: 1.22 mg/dL — ABNORMAL HIGH (ref 0.44–1.00)
GFR, Estimated: >60 mL/min (ref >60)
GFR, Estimated: 53 mL/min — ABNORMAL LOW (ref >60)
Glucose, Bld: 118 mg/dL — ABNORMAL HIGH (ref 70–99)
Glucose, Bld: 411 mg/dL — ABNORMAL HIGH (ref 70–99)
Potassium: 3.5 mmol/L (ref 3.5–5.1)
Potassium: 2.9 mmol/L — ABNORMAL LOW (ref 3.5–5.1)
Sodium: 135 mmol/L (ref 135–145)
Sodium: 143 mmol/L (ref 135–145)
Total Bilirubin: 0.9 mg/dL (ref 0.0–1.2)
Total Bilirubin: 0.9 mg/dL (ref 0.0–1.2)
Total Protein: 6.4 g/dL — ABNORMAL LOW (ref 6.5–8.1)
Total Protein: 5.2 g/dL — ABNORMAL LOW (ref 6.5–8.1)

## 2023-12-06 LAB — FOLATE: Folate: 28 ng/mL (ref >5.9)

## 2023-12-06 LAB — POCT I-STAT 7, (LYTES, BLD GAS, ICA,H+H)
Acid-base deficit: 7 mmol/L — ABNORMAL HIGH (ref 0.0–2.0)
Acid-base deficit: 4 mmol/L — ABNORMAL HIGH (ref 0.0–2.0)
Acid-base deficit: 7 mmol/L — ABNORMAL HIGH (ref 0.0–2.0)
Acid-base deficit: 6 mmol/L — ABNORMAL HIGH (ref 0.0–2.0)
Bicarbonate: 22.8 mmol/L (ref 20.0–28.0)
Bicarbonate: 23.3 mmol/L (ref 20.0–28.0)
Bicarbonate: 22.4 mmol/L (ref 20.0–28.0)
Bicarbonate: 19.1 mmol/L — ABNORMAL LOW (ref 20.0–28.0)
Calcium, Ion: 1.15 mmol/L (ref 1.15–1.40)
Calcium, Ion: 1.24 mmol/L (ref 1.15–1.40)
Calcium, Ion: 1.21 mmol/L (ref 1.15–1.40)
Calcium, Ion: 1.11 mmol/L — ABNORMAL LOW (ref 1.15–1.40)
HCT: 40 % (ref 36.0–46.0)
HCT: 36 % (ref 36.0–46.0)
HCT: 38 % (ref 36.0–46.0)
HCT: 37 % (ref 36.0–46.0)
Hemoglobin: 13.6 g/dL (ref 12.0–15.0)
Hemoglobin: 12.2 g/dL (ref 12.0–15.0)
Hemoglobin: 12.9 g/dL (ref 12.0–15.0)
Hemoglobin: 12.6 g/dL (ref 12.0–15.0)
O2 Saturation: 82 %
O2 Saturation: 87 %
O2 Saturation: 100 %
O2 Saturation: 100 %
Patient temperature: 96.6
Patient temperature: 36.6
Potassium: 3.7 mmol/L (ref 3.5–5.1)
Potassium: 2.7 mmol/L — CL (ref 3.5–5.1)
Potassium: 4.1 mmol/L (ref 3.5–5.1)
Potassium: 3.7 mmol/L (ref 3.5–5.1)
Sodium: 135 mmol/L (ref 135–145)
Sodium: 144 mmol/L (ref 135–145)
Sodium: 142 mmol/L (ref 135–145)
Sodium: 144 mmol/L (ref 135–145)
TCO2: 25 mmol/L (ref 22–32)
TCO2: 25 mmol/L (ref 22–32)
TCO2: 24 mmol/L (ref 22–32)
TCO2: 20 mmol/L — ABNORMAL LOW (ref 22–32)
pCO2 arterial: 65.1 mm[Hg] (ref 32–48)
pCO2 arterial: 51.7 mm[Hg] — ABNORMAL HIGH (ref 32–48)
pCO2 arterial: 62.6 mm[Hg] — ABNORMAL HIGH (ref 32–48)
pCO2 arterial: 35.3 mm[Hg] (ref 32–48)
pH, Arterial: 7.151 — CL (ref 7.35–7.45)
pH, Arterial: 7.262 — ABNORMAL LOW (ref 7.35–7.45)
pH, Arterial: 7.156 — CL (ref 7.35–7.45)
pH, Arterial: 7.339 — ABNORMAL LOW (ref 7.35–7.45)
pO2, Arterial: 61 mm[Hg] — ABNORMAL LOW (ref 83–108)
pO2, Arterial: 61 mm[Hg] — ABNORMAL LOW (ref 83–108)
pO2, Arterial: 256 mm[Hg] — ABNORMAL HIGH (ref 83–108)
pO2, Arterial: 249 mm[Hg] — ABNORMAL HIGH (ref 83–108)

## 2023-12-06 LAB — I-STAT VENOUS BLOOD GAS, ED
Acid-base deficit: 13 mmol/L — ABNORMAL HIGH (ref 0.0–2.0)
Bicarbonate: 11.8 mmol/L — ABNORMAL LOW (ref 20.0–28.0)
Calcium, Ion: 0.59 mmol/L — CL (ref 1.15–1.40)
HCT: 22 % — ABNORMAL LOW (ref 36.0–46.0)
Hemoglobin: 7.5 g/dL — ABNORMAL LOW (ref 12.0–15.0)
O2 Saturation: 99 %
Potassium: 2.3 mmol/L — CL (ref 3.5–5.1)
Sodium: 148 mmol/L — ABNORMAL HIGH (ref 135–145)
TCO2: 12 mmol/L — ABNORMAL LOW (ref 22–32)
pCO2, Ven: 22.6 mm[Hg] — ABNORMAL LOW (ref 44–60)
pH, Ven: 7.325 (ref 7.25–7.43)
pO2, Ven: 158 mm[Hg] — ABNORMAL HIGH (ref 32–45)

## 2023-12-06 LAB — POCT I-STAT, CHEM 8
BUN: 14 mg/dL (ref 6–20)
BUN: 15 mg/dL (ref 6–20)
Calcium, Ion: 1.15 mmol/L (ref 1.15–1.40)
Calcium, Ion: 1.27 mmol/L (ref 1.15–1.40)
Chloride: 98 mmol/L (ref 98–111)
Chloride: 106 mmol/L (ref 98–111)
Creatinine, Ser: 0.9 mg/dL (ref 0.44–1.00)
Creatinine, Ser: 1.1 mg/dL — ABNORMAL HIGH (ref 0.44–1.00)
Glucose, Bld: 464 mg/dL — ABNORMAL HIGH (ref 70–99)
Glucose, Bld: 381 mg/dL — ABNORMAL HIGH (ref 70–99)
HCT: 40 % (ref 36.0–46.0)
HCT: 36 % (ref 36.0–46.0)
Hemoglobin: 13.6 g/dL (ref 12.0–15.0)
Hemoglobin: 12.2 g/dL (ref 12.0–15.0)
Potassium: 3.7 mmol/L (ref 3.5–5.1)
Potassium: 2.8 mmol/L — ABNORMAL LOW (ref 3.5–5.1)
Sodium: 135 mmol/L (ref 135–145)
Sodium: 144 mmol/L (ref 135–145)
TCO2: 24 mmol/L (ref 22–32)
TCO2: 25 mmol/L (ref 22–32)

## 2023-12-06 LAB — BASIC METABOLIC PANEL
Anion gap: 13 (ref 5–15)
Anion gap: 15 (ref 5–15)
BUN: 14 mg/dL (ref 6–20)
BUN: 16 mg/dL (ref 6–20)
CO2: 21 mmol/L — ABNORMAL LOW (ref 22–32)
CO2: 18 mmol/L — ABNORMAL LOW (ref 22–32)
Calcium: 9.4 mg/dL (ref 8.9–10.3)
Calcium: 8.1 mg/dL — ABNORMAL LOW (ref 8.9–10.3)
Chloride: 101 mmol/L (ref 98–111)
Chloride: 108 mmol/L (ref 98–111)
Creatinine, Ser: 1.14 mg/dL — ABNORMAL HIGH (ref 0.44–1.00)
Creatinine, Ser: 1.24 mg/dL — ABNORMAL HIGH (ref 0.44–1.00)
GFR, Estimated: 57 mL/min — ABNORMAL LOW (ref >60)
GFR, Estimated: 52 mL/min — ABNORMAL LOW (ref >60)
Glucose, Bld: 411 mg/dL — ABNORMAL HIGH (ref 70–99)
Glucose, Bld: 465 mg/dL — ABNORMAL HIGH (ref 70–99)
Potassium: 4.4 mmol/L (ref 3.5–5.1)
Potassium: 4 mmol/L (ref 3.5–5.1)
Sodium: 135 mmol/L (ref 135–145)
Sodium: 141 mmol/L (ref 135–145)

## 2023-12-06 LAB — I-STAT CHEM 8, ED
BUN: 6 mg/dL (ref 6–20)
Calcium, Ion: 0.6 mmol/L — CL (ref 1.15–1.40)
Chloride: 123 mmol/L — ABNORMAL HIGH (ref 98–111)
Creatinine, Ser: 0.5 mg/dL (ref 0.44–1.00)
Glucose, Bld: 63 mg/dL — ABNORMAL LOW (ref 70–99)
HCT: 21 % — ABNORMAL LOW (ref 36.0–46.0)
Hemoglobin: 7.1 g/dL — ABNORMAL LOW (ref 12.0–15.0)
Potassium: 2.2 mmol/L — CL (ref 3.5–5.1)
Sodium: 149 mmol/L — ABNORMAL HIGH (ref 135–145)
TCO2: 13 mmol/L — ABNORMAL LOW (ref 22–32)

## 2023-12-06 LAB — ECHOCARDIOGRAM COMPLETE: S' Lateral: 2.9 cm

## 2023-12-06 LAB — CG4 I-STAT (LACTIC ACID)
Lactic Acid, Venous: 4.8 mmol/L (ref 0.5–1.9)
Lactic Acid, Venous: 6.7 mmol/L (ref 0.5–1.9)
Lactic Acid, Venous: 7.5 mmol/L (ref 0.5–1.9)

## 2023-12-06 LAB — ABO/RH: ABO/RH(D): O POS

## 2023-12-06 LAB — I-STAT CG4 LACTIC ACID, ED: Lactic Acid, Venous: 1.1 mmol/L (ref 0.5–1.9)

## 2023-12-06 LAB — POCT I-STAT EG7
Acid-base deficit: 3 mmol/L — ABNORMAL HIGH (ref 0.0–2.0)
Acid-base deficit: 4 mmol/L — ABNORMAL HIGH (ref 0.0–2.0)
Bicarbonate: 26.5 mmol/L (ref 20.0–28.0)
Bicarbonate: 27.3 mmol/L (ref 20.0–28.0)
Calcium, Ion: 1.23 mmol/L (ref 1.15–1.40)
Calcium, Ion: 1.29 mmol/L (ref 1.15–1.40)
HCT: 30 % — ABNORMAL LOW (ref 36.0–46.0)
HCT: 31 % — ABNORMAL LOW (ref 36.0–46.0)
Hemoglobin: 10.2 g/dL — ABNORMAL LOW (ref 12.0–15.0)
Hemoglobin: 10.5 g/dL — ABNORMAL LOW (ref 12.0–15.0)
O2 Saturation: 63 %
O2 Saturation: 67 %
Potassium: 3 mmol/L — ABNORMAL LOW (ref 3.5–5.1)
Potassium: 3.1 mmol/L — ABNORMAL LOW (ref 3.5–5.1)
Sodium: 145 mmol/L (ref 135–145)
Sodium: 146 mmol/L — ABNORMAL HIGH (ref 135–145)
TCO2: 29 mmol/L (ref 22–32)
TCO2: 30 mmol/L (ref 22–32)
pCO2, Ven: 82.4 mm[Hg] (ref 44–60)
pCO2, Ven: 85.5 mm[Hg] (ref 44–60)
pH, Ven: 7.112 — CL (ref 7.25–7.43)
pH, Ven: 7.115 — CL (ref 7.25–7.43)
pO2, Ven: 45 mm[Hg] (ref 32–45)
pO2, Ven: 48 mm[Hg] — ABNORMAL HIGH (ref 32–45)

## 2023-12-06 LAB — HEMOGLOBIN AND HEMATOCRIT, BLOOD
HCT: 41.2 % (ref 36.0–46.0)
Hemoglobin: 13.5 g/dL (ref 12.0–15.0)

## 2023-12-06 LAB — PROTIME-INR
INR: 1.4 — ABNORMAL HIGH (ref 0.8–1.2)
Prothrombin Time: 17.1 s — ABNORMAL HIGH (ref 11.4–15.2)

## 2023-12-06 LAB — ECHO TEE

## 2023-12-06 LAB — MRSA NEXT GEN BY PCR, NASAL: MRSA by PCR Next Gen: NOT DETECTED

## 2023-12-06 LAB — HEPATITIS PANEL, ACUTE
HCV Ab: NONREACTIVE
Hep A IgM: NONREACTIVE
Hep B C IgM: NONREACTIVE
Hepatitis B Surface Ag: NONREACTIVE

## 2023-12-06 LAB — SEDIMENTATION RATE
Sed Rate: 8 mm/h (ref 0–22)
Sed Rate: 8 mm/h (ref 0–22)

## 2023-12-06 LAB — MAGNESIUM
Magnesium: 1 mg/dL — ABNORMAL LOW (ref 1.7–2.4)
Magnesium: 4.8 mg/dL — ABNORMAL HIGH (ref 1.7–2.4)
Magnesium: 4 mg/dL — ABNORMAL HIGH (ref 1.7–2.4)

## 2023-12-06 LAB — CARBOXYHEMOGLOBIN - COOX: Carboxyhemoglobin: 1.7 % — ABNORMAL HIGH (ref 0.5–1.5)

## 2023-12-06 LAB — TROPONIN I (HIGH SENSITIVITY)
Troponin I (High Sensitivity): 136 ng/L (ref <18)
Troponin I (High Sensitivity): 118 ng/L (ref <18)
Troponin I (High Sensitivity): 36 ng/L — ABNORMAL HIGH (ref <18)
Troponin I (High Sensitivity): 484 ng/L (ref <18)
Troponin I (High Sensitivity): 1531 ng/L (ref <18)
Troponin I (High Sensitivity): 1666 ng/L (ref <18)

## 2023-12-06 LAB — C-REACTIVE PROTEIN
CRP: 0.5 mg/dL (ref <1.0)
CRP: 2.9 mg/dL — ABNORMAL HIGH (ref <1.0)

## 2023-12-06 LAB — TYPE AND SCREEN
ABO/RH(D): O POS
Antibody Screen: NEGATIVE

## 2023-12-06 LAB — HIV ANTIBODY (ROUTINE TESTING W REFLEX): HIV Screen 4th Generation wRfx: NONREACTIVE

## 2023-12-06 LAB — RPR: RPR Ser Ql: NONREACTIVE

## 2023-12-06 LAB — CBG MONITORING, ED: Glucose-Capillary: 66 mg/dL — ABNORMAL LOW (ref 70–99)

## 2023-12-06 LAB — FIBRINOGEN: Fibrinogen: 294 mg/dL (ref 210–475)

## 2023-12-06 LAB — GLUCOSE, CAPILLARY: Glucose-Capillary: 340 mg/dL — ABNORMAL HIGH (ref 70–99)

## 2023-12-06 LAB — HEPARIN LEVEL (UNFRACTIONATED): Heparin Unfractionated: <0.1 [IU]/mL — ABNORMAL LOW (ref 0.30–0.70)

## 2023-12-06 LAB — SARS CORONAVIRUS 2 BY RT PCR: SARS Coronavirus 2 by RT PCR: NEGATIVE

## 2023-12-06 LAB — APTT: aPTT: 34 s (ref 24–36)

## 2023-12-06 LAB — LACTIC ACID, PLASMA: Lactic Acid, Venous: 3.7 mmol/L (ref 0.5–1.9)

## 2023-12-06 SURGERY — RIGHT HEART CATH
Anesthesia: LOCAL | Site: Neck | Laterality: Right

## 2023-12-06 SURGERY — LEFT HEART CATH AND CORONARY ANGIOGRAPHY
Anesthesia: LOCAL

## 2023-12-06 MED ORDER — DEXMEDETOMIDINE HCL IN NACL 400 MCG/100ML IV SOLN
0.0000 ug/kg/h | INTRAVENOUS | Status: DC
  Administered 2023-12-06: 0.4 ug/kg/h via INTRAVENOUS
  Filled 2023-12-06: qty 100

## 2023-12-06 MED ORDER — IOHEXOL 350 MG/ML SOLN
INTRAVENOUS | Status: DC | PRN
  Administered 2023-12-06: 55 mL

## 2023-12-06 MED ORDER — HEPARIN (PORCINE) 25000 UT/250ML-% IV SOLN
850.0000 [IU]/h | INTRAVENOUS | Status: DC
Start: 2023-12-06 — End: 2023-12-06
  Administered 2023-12-06: 850 [IU]/h via INTRAVENOUS
  Filled 2023-12-06: qty 250

## 2023-12-06 MED ORDER — MIDAZOLAM HCL 2 MG/2ML IJ SOLN
1.0000 mg | INTRAMUSCULAR | Status: DC | PRN
  Administered 2023-12-06: 2 mg via INTRAVENOUS
  Filled 2023-12-06: qty 2

## 2023-12-06 MED ORDER — LIDOCAINE IN D5W 4-5 MG/ML-% IV SOLN
1.0000 mg/min | INTRAVENOUS | Status: DC
Start: 2023-12-06 — End: 2023-12-10
  Administered 2023-12-08 – 2023-12-10 (×2): 1 mg/min via INTRAVENOUS
  Filled 2023-12-06 (×3): qty 500

## 2023-12-06 MED ORDER — ATROPINE SULFATE 1 MG/10ML IJ SOSY
PREFILLED_SYRINGE | INTRAMUSCULAR | Status: AC
  Filled 2023-12-06: qty 10

## 2023-12-06 MED ORDER — ASPIRIN 81 MG PO TBEC: 81.0000 mg | DELAYED_RELEASE_TABLET | Freq: Every day | ORAL | Status: DC

## 2023-12-06 MED ORDER — DEXTROSE 50 % IV SOLN
INTRAVENOUS | Status: AC | PRN
  Administered 2023-12-06 (×2): 1 via INTRAVENOUS

## 2023-12-06 MED ORDER — PANTOPRAZOLE SODIUM 40 MG IV SOLR: 40.0000 mg | Freq: Every day | INTRAVENOUS | Status: DC

## 2023-12-06 MED ORDER — FENTANYL CITRATE (PF) 100 MCG/2ML IJ SOLN
INTRAMUSCULAR | Status: DC | PRN
  Administered 2023-12-06: 50 ug via INTRAVENOUS

## 2023-12-06 MED ORDER — POLYETHYLENE GLYCOL 3350 17 G PO PACK: 17.0000 g | PACK | Freq: Every day | ORAL | Status: DC | PRN

## 2023-12-06 MED ORDER — PHENTOLAMINE MESYLATE 5 MG IJ SOLR
5.0000 mg | Freq: Once | INTRAMUSCULAR | Status: AC
  Administered 2023-12-06: 5 mg via SUBCUTANEOUS
  Filled 2023-12-06: qty 5

## 2023-12-06 MED ORDER — CLOPIDOGREL BISULFATE 75 MG PO TABS: 75.0000 mg | ORAL_TABLET | Freq: Once | ORAL | Status: DC

## 2023-12-06 MED ORDER — SODIUM CHLORIDE 0.9 % IV SOLN
250.0000 mL | INTRAVENOUS | Status: AC | PRN
  Administered 2023-12-07: 250 mL via INTRAVENOUS

## 2023-12-06 MED ORDER — LIDOCAINE BOLUS VIA INFUSION
100.0000 mg | Freq: Once | INTRAVENOUS | Status: AC
  Administered 2023-12-06: 100 mg via INTRAVENOUS
  Filled 2023-12-06: qty 100

## 2023-12-06 MED ORDER — FENTANYL BOLUS VIA INFUSION: 50.0000 ug | INTRAVENOUS | Status: DC | PRN

## 2023-12-06 MED ORDER — NOREPINEPHRINE 16 MG/250ML-% IV SOLN
0.0000 ug/min | INTRAVENOUS | Status: DC
  Administered 2023-12-06: 35 ug/min via INTRAVENOUS
  Administered 2023-12-07: 16 ug/min via INTRAVENOUS
  Administered 2023-12-07: 23 ug/min via INTRAVENOUS
  Administered 2023-12-08: 7 ug/min via INTRAVENOUS
  Filled 2023-12-06 (×5): qty 250

## 2023-12-06 MED ORDER — SODIUM CHLORIDE 0.9 % IV SOLN
250.0000 mL | INTRAVENOUS | Status: AC
  Administered 2023-12-06: 10 mL via INTRAVENOUS

## 2023-12-06 MED ORDER — INSULIN REGULAR(HUMAN) IN NACL 100-0.9 UT/100ML-% IV SOLN
INTRAVENOUS | Status: DC
  Administered 2023-12-07: 4.6 [IU]/h via INTRAVENOUS
  Administered 2023-12-07: 8.5 [IU]/h via INTRAVENOUS
  Administered 2023-12-08: 7 [IU]/h via INTRAVENOUS
  Filled 2023-12-06 (×3): qty 100

## 2023-12-06 MED ORDER — HEPARIN SODIUM (PORCINE) 1000 UNIT/ML IJ SOLN
INTRAMUSCULAR | Status: DC | PRN
  Administered 2023-12-06: 5000 [IU] via INTRAVENOUS

## 2023-12-06 MED ORDER — CHLORHEXIDINE GLUCONATE CLOTH 2 % EX PADS
6.0000 | MEDICATED_PAD | Freq: Every day | CUTANEOUS | Status: DC
  Administered 2023-12-06 – 2023-12-13 (×8): 6 via TOPICAL

## 2023-12-06 MED ORDER — MIDAZOLAM-SODIUM CHLORIDE 100-0.9 MG/100ML-% IV SOLN
0.0000 mg/h | INTRAVENOUS | Status: DC
  Administered 2023-12-07: 5.5 mg/h via INTRAVENOUS
  Administered 2023-12-08 – 2023-12-09 (×2): 6 mg/h via INTRAVENOUS
  Filled 2023-12-06 (×3): qty 100

## 2023-12-06 MED ORDER — FAMOTIDINE 20 MG PO TABS
20.0000 mg | ORAL_TABLET | Freq: Two times a day (BID) | ORAL | Status: DC
  Administered 2023-12-06 – 2023-12-07 (×2): 20 mg
  Filled 2023-12-06 (×2): qty 1

## 2023-12-06 MED ORDER — MIDAZOLAM HCL 2 MG/2ML IJ SOLN
INTRAMUSCULAR | Status: DC | PRN
  Administered 2023-12-06: 2 mg via INTRAVENOUS

## 2023-12-06 MED ORDER — EPINEPHRINE 1 MG/10ML IJ SOSY
PREFILLED_SYRINGE | INTRAMUSCULAR | Status: AC | PRN
  Administered 2023-12-06 (×7): 1 mg via INTRAVENOUS

## 2023-12-06 MED ORDER — SUCCINYLCHOLINE CHLORIDE 200 MG/10ML IV SOSY
PREFILLED_SYRINGE | INTRAVENOUS | Status: AC
  Filled 2023-12-06: qty 10

## 2023-12-06 MED ORDER — SODIUM CHLORIDE 0.9 % IV SOLN
40.0000 meq | Freq: Once | INTRAVENOUS | Status: DC
  Filled 2023-12-06: qty 20

## 2023-12-06 MED ORDER — MAGNESIUM SULFATE 2 GM/50ML IV SOLN: 2.0000 g | Freq: Once | INTRAVENOUS | Status: DC

## 2023-12-06 MED ORDER — MAGNESIUM SULFATE 2 GM/50ML IV SOLN
2.0000 g | Freq: Once | INTRAVENOUS | Status: AC
  Filled 2023-12-06: qty 50

## 2023-12-06 MED ORDER — HEPARIN BOLUS VIA INFUSION
4000.0000 [IU] | Freq: Once | INTRAVENOUS | Status: AC
  Administered 2023-12-06: 4000 [IU] via INTRAVENOUS
  Filled 2023-12-06: qty 4000

## 2023-12-06 MED ORDER — VANCOMYCIN HCL 2000 MG/400ML IV SOLN
2000.0000 mg | INTRAVENOUS | Status: AC
  Administered 2023-12-06: 2000 mg via INTRAVENOUS
  Filled 2023-12-06: qty 400

## 2023-12-06 MED ORDER — SODIUM CHLORIDE 0.9 % WEIGHT BASED INFUSION: 1.5000 mL/kg/h | INTRAVENOUS | Status: AC

## 2023-12-06 MED ORDER — MAGNESIUM SULFATE 2 GM/50ML IV SOLN
INTRAVENOUS | Status: AC
  Administered 2023-12-06: 2 g via INTRAVENOUS
  Filled 2023-12-06: qty 50

## 2023-12-06 MED ORDER — SODIUM CHLORIDE 0.9 % IV SOLN
1000.0000 mg | INTRAVENOUS | Status: DC
  Administered 2023-12-06 – 2023-12-07 (×2): 1000 mg via INTRAVENOUS
  Filled 2023-12-06 (×4): qty 16

## 2023-12-06 MED ORDER — NITROGLYCERIN 2 % TD OINT
1.0000 [in_us] | TOPICAL_OINTMENT | Freq: Three times a day (TID) | TRANSDERMAL | Status: AC
  Filled 2023-12-06: qty 30

## 2023-12-06 MED ORDER — LIDOCAINE IN D5W 4-5 MG/ML-% IV SOLN
INTRAVENOUS | Status: AC | PRN
  Administered 2023-12-06: 1 mg/min via INTRAVENOUS

## 2023-12-06 MED ORDER — FENTANYL CITRATE (PF) 100 MCG/2ML IJ SOLN
INTRAMUSCULAR | Status: AC
  Filled 2023-12-06: qty 2

## 2023-12-06 MED ORDER — DOCUSATE SODIUM 50 MG/5ML PO LIQD
100.0000 mg | Freq: Two times a day (BID) | ORAL | Status: DC
  Administered 2023-12-06 – 2023-12-08 (×5): 100 mg
  Filled 2023-12-06 (×5): qty 10

## 2023-12-06 MED ORDER — ATORVASTATIN CALCIUM 80 MG PO TABS
80.0000 mg | ORAL_TABLET | Freq: Every day | ORAL | Status: DC
  Administered 2023-12-06: 80 mg via ORAL
  Filled 2023-12-06: qty 1

## 2023-12-06 MED ORDER — POLYETHYLENE GLYCOL 3350 17 G PO PACK: 17.0000 g | PACK | Freq: Every day | ORAL | Status: DC

## 2023-12-06 MED ORDER — SODIUM CHLORIDE 0.9 % IV SOLN
2.0000 g | Freq: Two times a day (BID) | INTRAVENOUS | Status: DC
  Administered 2023-12-06 – 2023-12-07 (×3): 2 g via INTRAVENOUS
  Filled 2023-12-06 (×3): qty 12.5

## 2023-12-06 MED ORDER — ROCURONIUM BROMIDE 10 MG/ML (PF) SYRINGE
100.0000 mg | PREFILLED_SYRINGE | Freq: Once | INTRAVENOUS | Status: AC
  Filled 2023-12-06: qty 10

## 2023-12-06 MED ORDER — ROCURONIUM BROMIDE 10 MG/ML (PF) SYRINGE
PREFILLED_SYRINGE | INTRAVENOUS | Status: AC
  Administered 2023-12-06: 100 mg via INTRAVENOUS
  Filled 2023-12-06: qty 10

## 2023-12-06 MED ORDER — FENTANYL 2500MCG IN NS 250ML (10MCG/ML) PREMIX INFUSION
50.0000 ug/h | INTRAVENOUS | Status: DC
  Administered 2023-12-06: 100 ug/h via INTRAVENOUS
  Filled 2023-12-06: qty 250

## 2023-12-06 MED ORDER — DEXTROSE 50 % IV SOLN
0.0000 mL | INTRAVENOUS | Status: DC | PRN
Start: 2023-12-06 — End: 2023-12-08

## 2023-12-06 MED ORDER — ADULT MULTIVITAMIN W/MINERALS CH
1.0000 | ORAL_TABLET | Freq: Every day | ORAL | Status: DC
  Administered 2023-12-07 – 2023-12-08 (×2): 1
  Filled 2023-12-06 (×2): qty 1

## 2023-12-06 MED ORDER — VANCOMYCIN HCL 1500 MG/300ML IV SOLN: 1500.0000 mg | Freq: Two times a day (BID) | INTRAVENOUS | Status: DC

## 2023-12-06 MED ORDER — FENTANYL CITRATE PF 50 MCG/ML IJ SOSY: 50.0000 ug | PREFILLED_SYRINGE | INTRAMUSCULAR | Status: DC | PRN

## 2023-12-06 MED ORDER — CALCIUM GLUCONATE-NACL 2-0.675 GM/100ML-% IV SOLN
2.0000 g | Freq: Once | INTRAVENOUS | Status: AC
  Administered 2023-12-06: 2000 mg via INTRAVENOUS
  Filled 2023-12-06: qty 100

## 2023-12-06 MED ORDER — VERAPAMIL HCL 2.5 MG/ML IV SOLN
INTRAVENOUS | Status: DC | PRN
  Administered 2023-12-06: 3 mL via INTRA_ARTERIAL

## 2023-12-06 MED ORDER — SODIUM BICARBONATE 8.4 % IV SOLN
INTRAVENOUS | Status: AC | PRN
  Administered 2023-12-06 (×2): 50 meq via INTRAVENOUS

## 2023-12-06 MED ORDER — EPINEPHRINE 1 MG/10ML IJ SOSY
PREFILLED_SYRINGE | INTRAMUSCULAR | Status: AC | PRN
  Administered 2023-12-06 (×6): 1 mg via INTRAVENOUS

## 2023-12-06 MED ORDER — POTASSIUM CHLORIDE 10 MEQ/50ML IV SOLN
10.0000 meq | INTRAVENOUS | Status: AC
  Administered 2023-12-06 (×4): 10 meq via INTRAVENOUS
  Filled 2023-12-06 (×2): qty 50

## 2023-12-06 MED ORDER — PROCHLORPERAZINE MALEATE 5 MG PO TABS: 5.0000 mg | ORAL_TABLET | Freq: Four times a day (QID) | ORAL | Status: DC | PRN

## 2023-12-06 MED ORDER — PANTOPRAZOLE SODIUM 40 MG IV SOLR
40.0000 mg | Freq: Two times a day (BID) | INTRAVENOUS | Status: DC
  Administered 2023-12-06 – 2023-12-09 (×7): 40 mg via INTRAVENOUS
  Filled 2023-12-06 (×8): qty 10

## 2023-12-06 MED ORDER — INSULIN ASPART 100 UNIT/ML IJ SOLN: 2.0000 [IU] | INTRAMUSCULAR | Status: DC

## 2023-12-06 MED ORDER — SODIUM CHLORIDE 0.9 % IV SOLN: 250.0000 mg | Freq: Four times a day (QID) | INTRAVENOUS | Status: DC

## 2023-12-06 MED ORDER — SODIUM CHLORIDE 0.9% IV SOLUTION: INTRAVENOUS | Status: DC | PRN

## 2023-12-06 MED ORDER — FENTANYL CITRATE (PF) 100 MCG/2ML IJ SOLN: 50.0000 ug | INTRAMUSCULAR | Status: DC | PRN

## 2023-12-06 MED ORDER — SODIUM CHLORIDE 0.9 % IV SOLN
3.0000 g | Freq: Four times a day (QID) | INTRAVENOUS | Status: DC
  Administered 2023-12-06: 3 g via INTRAVENOUS
  Filled 2023-12-06 (×2): qty 8

## 2023-12-06 MED ORDER — VASOPRESSIN 20 UNITS/100 ML INFUSION FOR SHOCK
INTRAVENOUS | Status: AC
  Filled 2023-12-06: qty 100

## 2023-12-06 MED ORDER — HEPARIN (PORCINE) IN NACL 1000-0.9 UT/500ML-% IV SOLN
INTRAVENOUS | Status: DC | PRN
  Administered 2023-12-06 (×2): 500 mL

## 2023-12-06 MED ORDER — SENNA 8.6 MG PO TABS
1.0000 | ORAL_TABLET | Freq: Every day | ORAL | Status: DC
  Administered 2023-12-07 – 2023-12-08 (×2): 8.6 mg
  Filled 2023-12-06 (×2): qty 1

## 2023-12-06 MED ORDER — POTASSIUM CHLORIDE CRYS ER 20 MEQ PO TBCR
40.0000 meq | EXTENDED_RELEASE_TABLET | Freq: Once | ORAL | Status: AC
  Administered 2023-12-06: 40 meq via ORAL
  Filled 2023-12-06: qty 2

## 2023-12-06 MED ORDER — SODIUM CHLORIDE 0.9% FLUSH: 3.0000 mL | INTRAVENOUS | Status: DC | PRN

## 2023-12-06 MED ORDER — EPINEPHRINE 0.1 MG/10ML (10 MCG/ML) SYRINGE FOR IV PUSH (FOR BLOOD PRESSURE SUPPORT)
PREFILLED_SYRINGE | INTRAVENOUS | Status: AC | PRN
  Administered 2023-12-06: 5 ug via INTRAVENOUS

## 2023-12-06 MED ORDER — LORAZEPAM 2 MG/ML IJ SOLN
INTRAMUSCULAR | Status: AC
  Administered 2023-12-06: 1 mg
  Filled 2023-12-06: qty 1

## 2023-12-06 MED ORDER — MIDAZOLAM HCL 2 MG/2ML IJ SOLN
INTRAMUSCULAR | Status: AC
  Filled 2023-12-06: qty 2

## 2023-12-06 MED ORDER — NOREPINEPHRINE 4 MG/250ML-% IV SOLN
2.0000 ug/min | INTRAVENOUS | Status: DC
  Administered 2023-12-06: 2 ug/min via INTRAVENOUS

## 2023-12-06 MED ORDER — MIDAZOLAM-SODIUM CHLORIDE 100-0.9 MG/100ML-% IV SOLN
0.0000 mg/h | INTRAVENOUS | Status: DC
  Administered 2023-12-06: 2 mg/h via INTRAVENOUS
  Filled 2023-12-06: qty 100

## 2023-12-06 MED ORDER — SODIUM CHLORIDE 0.9% IV SOLUTION: INTRAVENOUS | Status: DC

## 2023-12-06 MED ORDER — IPRATROPIUM-ALBUTEROL 0.5-2.5 (3) MG/3ML IN SOLN
RESPIRATORY_TRACT | Status: AC
  Filled 2023-12-06: qty 3

## 2023-12-06 MED ORDER — POTASSIUM CHLORIDE 10 MEQ/100ML IV SOLN
10.0000 meq | INTRAVENOUS | Status: AC
  Administered 2023-12-06 (×2): 10 meq via INTRAVENOUS
  Filled 2023-12-06 (×2): qty 100

## 2023-12-06 MED ORDER — EPINEPHRINE HCL 5 MG/250ML IV SOLN IN NS
INTRAVENOUS | Status: AC
  Filled 2023-12-06: qty 250

## 2023-12-06 MED ORDER — SODIUM CHLORIDE 0.9 % IV SOLN: INTRAVENOUS | Status: AC

## 2023-12-06 MED ORDER — SODIUM BICARBONATE 8.4 % IV SOLN
100.0000 meq | Freq: Once | INTRAVENOUS | Status: AC
  Administered 2023-12-06: 100 meq via INTRAVENOUS
  Filled 2023-12-06: qty 100

## 2023-12-06 MED ORDER — SODIUM CHLORIDE 0.9 % IV SOLN: INTRAVENOUS | Status: AC | PRN

## 2023-12-06 MED ORDER — ETOMIDATE 2 MG/ML IV SOLN
INTRAVENOUS | Status: AC
  Filled 2023-12-06: qty 20

## 2023-12-06 MED ORDER — LIDOCAINE HCL (CARDIAC) PF 100 MG/5ML IV SOSY
PREFILLED_SYRINGE | INTRAVENOUS | Status: AC | PRN
  Administered 2023-12-06: 94.8 mg via INTRAVENOUS

## 2023-12-06 MED ORDER — FENTANYL CITRATE PF 50 MCG/ML IJ SOSY
50.0000 ug | PREFILLED_SYRINGE | INTRAMUSCULAR | Status: DC | PRN
  Administered 2023-12-06: 50 ug via INTRAVENOUS

## 2023-12-06 MED ORDER — CALCIUM CHLORIDE 10 % IV SOLN
INTRAVENOUS | Status: AC | PRN
  Administered 2023-12-06: 1 g via INTRAVENOUS

## 2023-12-06 MED ORDER — MIDAZOLAM BOLUS VIA INFUSION: 0.0000 mg | INTRAVENOUS | Status: DC | PRN

## 2023-12-06 MED ORDER — EPINEPHRINE HCL 5 MG/250ML IV SOLN IN NS
0.5000 ug/min | INTRAVENOUS | Status: DC
  Administered 2023-12-07: 3 ug/min via INTRAVENOUS
  Filled 2023-12-06: qty 250

## 2023-12-06 MED ORDER — ASPIRIN 325 MG PO TBEC
325.0000 mg | DELAYED_RELEASE_TABLET | Freq: Every day | ORAL | Status: DC
  Administered 2023-12-06: 325 mg via ORAL
  Filled 2023-12-06: qty 1

## 2023-12-06 MED ORDER — EPINEPHRINE 0.1 MG/10ML (10 MCG/ML) SYRINGE FOR IV PUSH (FOR BLOOD PRESSURE SUPPORT)
PREFILLED_SYRINGE | INTRAVENOUS | Status: AC | PRN
  Administered 2023-12-06: 10 ug via INTRAVENOUS

## 2023-12-06 MED ORDER — AMIODARONE HCL 150 MG/3ML IV SOLN
INTRAVENOUS | Status: AC | PRN
  Administered 2023-12-06 (×2): 300 mg via INTRAVENOUS

## 2023-12-06 MED ORDER — IPRATROPIUM-ALBUTEROL 0.5-2.5 (3) MG/3ML IN SOLN
3.0000 mL | RESPIRATORY_TRACT | Status: DC
  Administered 2023-12-06 – 2023-12-10 (×23): 3 mL via RESPIRATORY_TRACT
  Filled 2023-12-06 (×22): qty 3

## 2023-12-06 MED ORDER — CALCIUM GLUCONATE-NACL 1-0.675 GM/50ML-% IV SOLN
INTRAVENOUS | Status: AC
  Filled 2023-12-06: qty 50

## 2023-12-06 MED ORDER — MORPHINE SULFATE (PF) 2 MG/ML IV SOLN: 1.0000 mg | Freq: Once | INTRAVENOUS | Status: DC | PRN

## 2023-12-06 MED ORDER — HEPARIN SODIUM (PORCINE) 1000 UNIT/ML IJ SOLN
INTRAMUSCULAR | Status: AC
  Filled 2023-12-06: qty 10

## 2023-12-06 MED ORDER — ATORVASTATIN CALCIUM 80 MG PO TABS
80.0000 mg | ORAL_TABLET | Freq: Every day | ORAL | Status: DC
  Administered 2023-12-07 – 2023-12-08 (×2): 80 mg
  Filled 2023-12-06 (×2): qty 1

## 2023-12-06 MED ORDER — VERAPAMIL HCL 2.5 MG/ML IV SOLN
INTRAVENOUS | Status: AC
  Filled 2023-12-06: qty 2

## 2023-12-06 MED ORDER — LIDOCAINE HCL (PF) 1 % IJ SOLN
INTRAMUSCULAR | Status: AC
  Filled 2023-12-06: qty 30

## 2023-12-06 MED ORDER — FENTANYL 2500MCG IN NS 250ML (10MCG/ML) PREMIX INFUSION
0.0000 ug/h | INTRAVENOUS | Status: DC
  Administered 2023-12-07 – 2023-12-09 (×4): 150 ug/h via INTRAVENOUS
  Filled 2023-12-06 (×4): qty 250

## 2023-12-06 MED ORDER — MAGNESIUM SULFATE 4 GM/100ML IV SOLN
INTRAVENOUS | Status: AC
  Filled 2023-12-06: qty 100

## 2023-12-06 MED ORDER — LIDOCAINE HCL (PF) 1 % IJ SOLN
INTRAMUSCULAR | Status: DC | PRN
  Administered 2023-12-06: 10 mL
  Administered 2023-12-06: 5 mL
  Administered 2023-12-06: 2 mL

## 2023-12-06 MED ORDER — VASOPRESSIN 20 UNITS/100 ML INFUSION FOR SHOCK
0.0300 [IU]/min | INTRAVENOUS | Status: DC
  Administered 2023-12-07 (×3): 0.04 [IU]/min via INTRAVENOUS
  Administered 2023-12-08 (×2): 0.03 [IU]/min via INTRAVENOUS
  Administered 2023-12-08: 0.04 [IU]/min via INTRAVENOUS
  Filled 2023-12-06 (×6): qty 100

## 2023-12-06 MED ORDER — FENTANYL CITRATE PF 50 MCG/ML IJ SOSY
50.0000 ug | PREFILLED_SYRINGE | Freq: Once | INTRAMUSCULAR | Status: DC
Start: 2023-12-06 — End: 2023-12-07

## 2023-12-06 MED ORDER — DOCUSATE SODIUM 50 MG/5ML PO LIQD: 100.0000 mg | Freq: Two times a day (BID) | ORAL | Status: DC

## 2023-12-06 MED ORDER — SODIUM CHLORIDE 0.9% FLUSH
3.0000 mL | Freq: Two times a day (BID) | INTRAVENOUS | Status: DC
  Administered 2023-12-07: 3 mL via INTRAVENOUS

## 2023-12-06 MED ORDER — HEPARIN SODIUM (PORCINE) 5000 UNIT/ML IJ SOLN
5000.0000 [IU] | Freq: Three times a day (TID) | INTRAMUSCULAR | Status: AC
  Administered 2023-12-06 – 2023-12-10 (×13): 5000 [IU] via SUBCUTANEOUS
  Filled 2023-12-06 (×13): qty 1

## 2023-12-06 MED ORDER — VANCOMYCIN HCL IN DEXTROSE 1-5 GM/200ML-% IV SOLN
1000.0000 mg | INTRAVENOUS | Status: DC
  Administered 2023-12-07: 1000 mg via INTRAVENOUS
  Filled 2023-12-06: qty 200

## 2023-12-06 MED ORDER — EPINEPHRINE HCL 5 MG/250ML IV SOLN IN NS: 0.5000 ug/min | INTRAVENOUS | Status: DC

## 2023-12-06 MED ORDER — POLYETHYLENE GLYCOL 3350 17 G PO PACK
17.0000 g | PACK | Freq: Every day | ORAL | Status: DC
  Administered 2023-12-07 – 2023-12-08 (×2): 17 g
  Filled 2023-12-06 (×2): qty 1

## 2023-12-06 MED ORDER — DOCUSATE SODIUM 50 MG/5ML PO LIQD: 100.0000 mg | Freq: Two times a day (BID) | ORAL | Status: DC | PRN

## 2023-12-06 MED ORDER — MAGNESIUM SULFATE 50 % IJ SOLN
INTRAMUSCULAR | Status: AC | PRN
  Administered 2023-12-06: 2 g via INTRAVENOUS

## 2023-12-06 SURGICAL SUPPLY — 11 items
CATH SWAN GANZ 7F STRAIGHT (CATHETERS) IMPLANT
GLIDESHEATH SLEND SS 6F .021 (SHEATH) IMPLANT
GUIDEWIRE INQWIRE 1.5J.035X260 (WIRE) IMPLANT
INQWIRE 1.5J .035X260CM (WIRE) ×3
PACK CARDIAC CATHETERIZATION (CUSTOM PROCEDURE TRAY) ×3 IMPLANT
SHEATH PINNACLE 6F 10CM (SHEATH) IMPLANT
SHEATH PINNACLE 7F 10CM (SHEATH) IMPLANT
SHEATH PROBE COVER 6X72 (BAG) IMPLANT
WIRE EMERALD 3MM-J .035X150CM (WIRE) IMPLANT
WIRE MICRO SET SILHO 5FR 7 (SHEATH) IMPLANT
WIRE PACING TEMP ST TIP 5 (CATHETERS) IMPLANT

## 2023-12-06 SURGICAL SUPPLY — 9 items
CATH INFINITI 5 FR JL3.5 (CATHETERS) IMPLANT
CATH INFINITI 5FR ANG PIGTAIL (CATHETERS) IMPLANT
CATH INFINITI JR4 5F (CATHETERS) IMPLANT
DEVICE RAD COMP TR BAND LRG (VASCULAR PRODUCTS) IMPLANT
GLIDESHEATH SLEND A-KIT 6F 22G (SHEATH) IMPLANT
GUIDEWIRE INQWIRE 1.5J.035X260 (WIRE) IMPLANT
INQWIRE 1.5J .035X260CM (WIRE) ×1
PACK CARDIAC CATHETERIZATION (CUSTOM PROCEDURE TRAY) ×1 IMPLANT
SHEATH PROBE COVER 6X72 (BAG) IMPLANT

## 2023-12-06 NOTE — Assessment & Plan Note (Signed)
AST 77. Likely alcohol vx fatty liver contributing. -Hepatitis panel -CMP

## 2023-12-06 NOTE — Assessment & Plan Note (Signed)
VBG 7.58, CO2 18.8, bicarb 17.7. Suspect in setting of pt's new encephalitis vs potential stroke. No hypoxia or respiratory issues on exam. Ammonia normal, LFTs unremarkable, will obtain hepatitis panel. - F/u MRI - Continue to monitor

## 2023-12-06 NOTE — TOC Initial Note (Signed)
Transition of Care [facility]) - Initial/Assessment Note    Patient Details  Name: Deanna Scott MRN: [id number] Date of Birth: 08/09/1971  [facility]) CM/SW Contact:    Reva Bores, LCSWA Phone Number: 12/06/2023, 4:01 PM  Clinical Narrative:  3:59 PM- HF CSW attempted to call pts daughter, [name] by phone. However, the person who answered the phone stated that was not her phone.   4:00 PM- CSW called to see if she could reach one of the pts daughters or family members listed. The VM was full. CSW will continue to make attempts to reach  pts family.   TOC will continue following.          Patient Goals and CMS Choice            Expected Discharge Plan and Services                                              Prior Living Arrangements/Services                       Activities of Daily Living      Permission Sought/Granted                  Emotional Assessment              Admission diagnosis:  Cardiac arrest (HCC) [I46.9] Confusion [R41.0] Altered mental status [R41.82] Acute cystitis without hematuria [N30.00] Patient Active Problem List   Diagnosis Date Noted   Cardiac arrest (HCC) 12/06/2023   Altered mental status 12/05/2023   Urinary tract infection 12/05/2023   Acute respiratory alkalosis 12/05/2023   Transaminitis 12/05/2023   New onset a-fib (HCC) 12/05/2023   Confusion 12/05/2023   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/04/2023   Acute recurrent frontal sinusitis 09/21/2023   Perimenopause 05/21/2023   Bacterial sinusitis 05/21/2023   Bloody diarrhea 09/16/2022   Right foot pain 08/21/2022   Elevated alkaline phosphatase level 08/11/2022   Angina pectoris (HCC) 06/08/2022   Environmental allergies 06/08/2022   Prediabetes 10/05/2021   Dyslipidemia 10/05/2021   HTN (hypertension) 09/19/2021   Hyperlipidemia 09/06/2021   BV (bacterial vaginosis) 11/11/2020   Snoring 06/08/2020   Extremity numbness 10/24/2019    Shortness of breath 10/23/2019   Lateral epicondylitis 10/08/2019   Fatigue 10/08/2019   Screening breast examination 10/07/2019   Triceps tendonitis 07/21/2019   Low back pain 06/18/2019   Vaginal wall prolapse 07/29/2017   Migraines 07/27/2017   GERD (gastroesophageal reflux disease) 07/29/2014   Dizziness 02/06/2013   OBESITY, NOS 01/03/2007   PCP:  Darral Dash, DO Pharmacy:   [facility] [address], [address] [place] [address] Phone: [phone] Fax: [phone]  [place] [facility] [address] [place] [address] Phone: [phone] Fax: [phone]  [id number] [place], [place] - [address] AT [facility] [address] [place] [address] Phone: [phone] Fax: [phone]     Social Drivers of Health (SDOH) Social History: SDOH Screenings   Food Insecurity: Food Insecurity Present (01/10/2023)  Housing: Low Risk  (01/10/2023)  Transportation Needs: No Transportation Needs (01/10/2023)  Utilities: Not At Risk (01/10/2023)  Depression (PHQ2-9): Low Risk  (10/19/2023)  Financial Resource Strain: Medium Risk (01/10/2023)  Tobacco Use: Medium Risk (12/05/2023)   SDOH Interventions:     Readmission Risk Interventions     No data to display

## 2023-12-06 NOTE — Consult Note (Addendum)
NAME:  Deanna Scott, MRN:  [id number], DOB:  1970/12/09, LOS: 0 ADMISSION DATE:  12/05/2023, CONSULTATION DATE:  12/06/23 REFERRING MD:  FPTS , CHIEF COMPLAINT:  CPR in progress    History of Present Illness:   53 yo F PMH HTN, HLD, CSF leak sp repair, tobacco use, migraines who was admitted to [facility] 1/29 after presenting with reported AMS, though as additional information has been obtained this sounds like it was related to a syncopal event. Admitting though possible c/w sz, so an MRI brain was obtained which was unrevealing  On 1/29 night into 1/30 the pt was having intermittent chest pain and DOE. She had a marginally elevated hs trop 1/29 evening at 161, remained in the 100s into 1/30. An abnormal rhythm was noted by ED nursing 1/30 around 0700. . Later started on hep gtt . Multiple EKGs obtained.  Cardiology was consulted.   Shortly after, patient c/o chest pain, became bradycardic and arrested. PCCM consulted while compressions were in progress to weigh in on if TPA / tnkase should be considered.   Both PCCM and cardiology arrived to bedside essentially at the same time.  ROSC was achieved on our arrival. There is not a great timeline of arrest duration, but sounds like 15 min - 20 min. Did not get RSI meds during peri-arrest intubation.   Is having some movements following ROSC.    EKGs reviewed 631- abnormal T waves, abnormal R wave progression   801- repolarization abnormality c/f anterolateral ischemia  1202- brady, RBBB concern for acute lateral MI   Tele review w some episodes of CHB, maybe episode of VF   Sounds like she arrested shortly after 1202 EKG  Istat labs w K 2.2 iCal 0.6 hgb 7  CBG 66, got 2 amp   Pertinent  Medical History  HTN HLD Repaired CSF leak   Significant Hospital Events: Including procedures, antibiotic start and stop dates in addition to other pertinent events   1/29 syncope chest pain OOH. AdmitFPTS to obv. MRI neg. Progressive chest pain 1/29-30  night. Mild trop bump 1/30ongoing CP. EKG changes. Bradycardic and arrested. Cards consulted right before arrest. PCCM consulted during code   Interim History / Subjective:   ROSC achieved   Objective   Blood pressure (!) 140/110, pulse (!) 127, temperature 97.8 F (36.6 C), temperature source Oral, resp. rate (!) 22, last menstrual period 03/02/2012, SpO2 (!) 87%.    Vent Mode: PRVC FiO2 (%):  [100 %] 100 % Set Rate:  [22 bmp] 22 bmp Vt Set:  [400 mL] 400 mL PEEP:  [8 cmH20-12 cmH20] 12 cmH20 Plateau Pressure:  [19 cmH20] 19 cmH20  No intake or output data in the 24 hours ending 12/06/23 1345 There were no vitals filed for this visit.  Examination: General: critically ill obese F  HENT: ETT secure  Lungs: mechancically ventilated  Cardiovascular: tachycardic. On POCUs does not have a blown RV. Looks like septal wall abnormality  Abdomen: soft ndnt  Extremities: No acute joint deformity  Neuro: Moving spontaneously  GU: no foley   Resolved Hospital Problem list     Assessment & Plan:   Cardiac arrest // VF  CHB Syncope, atypical chest pain -case d/w cardiology and EM at bedside. POCUs reviewed together.  P -emergent cath -hept gtt  -have ordered for periph NE if needed for MAP > 65  -ICU after case -supportive care, avoid fevers but further decisions pending cath  -formal ECHO is no TEE in cath lab -  STAT labs -if cath unrevealing, consider CTA chest, but RV on POCUS post arrest makes ddx massive PE seem less likely    Acute respiratory failure w hypercarbia and hypoxia  -from etco2 -possible aspiration  P -ABG, CXR -VAP, pulm hygiene -short course of unasyn for possible aspiration  -PAD pending neuro exam post cath -- favor precedex PRN fent   Hypokalemia Hypomagnesemia Hypocalcemia -all from istat P -stat repeats are ordered (not istat)  -received K (prior bmp was K 3.5) -CaGlu Mag in interim   Anemia -large drop from 12.5 to 7 in 8 hrs -- c/f lab  error from istat  P -stat repeat, not from isat   Metabolic acidosis  -BMP pending   Possible UTI -rcvd 1x rocephin -reflex cx pending. Will be on empiric unasyn for aspiration coverage regardless   Best Practice (right click and "Reselect all SmartList Selections" daily)   Diet/type: NPO DVT prophylaxis systemic heparin Pressure ulcer(s): pressure ulcer assessment deferred  GI prophylaxis: PPI Lines: N/A Foley:  N/A Code Status:  full code Last date of multidisciplinary goals of care discussion [family updated 1/30 ]  Labs   CBC: Recent Labs  Lab 12/05/23 1003 12/05/23 1140 12/06/23 0402 12/06/23 1212 12/06/23 1233  WBC 8.3  --  10.0  --   --   NEUTROABS 6.0  --   --   --   --   HGB 14.2 14.3 12.5 7.1* 7.5*  HCT 43.3 42.0 38.4 21.0* 22.0*  MCV 90.8  --  91.0  --   --   PLT 261  --  218  --   --     Basic Metabolic Panel: Recent Labs  Lab 12/05/23 1003 12/05/23 1140 12/06/23 0402 12/06/23 1207 12/06/23 1212 12/06/23 1233  NA 138 139 135  --  149* 148*  K 4.6 3.6 3.5  --  2.2* 2.3*  CL 107  --  104  --  123*  --   CO2 22  --  19*  --   --   --   GLUCOSE 166*  --  118*  --  63*  --   BUN 14  --  12  --  6  --   CREATININE 0.87  --  0.74  --  0.50  --   CALCIUM 9.8  --  8.6*  --   --   --   MG  --   --   --  1.0*  --   --    GFR: Estimated Creatinine Clearance: 86.3 mL/min (by C-G formula based on SCr of 0.5 mg/dL). Recent Labs  Lab 12/05/23 1003 12/05/23 1140 12/06/23 0402 12/06/23 1212  WBC 8.3  --  10.0  --   LATICACIDVEN  --  2.5*  --  1.1    Liver Function Tests: Recent Labs  Lab 12/05/23 1003 12/06/23 0402  AST 77* 32  ALT 38 24  ALKPHOS 111 82  BILITOT 0.8 0.9  PROT 7.1 6.4*  ALBUMIN 3.8 3.4*   No results for input(s): "LIPASE", "AMYLASE" in the last 168 hours. Recent Labs  Lab 12/05/23 1217  AMMONIA 19    ABG    Component Value Date/Time   HCO3 11.8 (L) 12/06/2023 1233   TCO2 12 (L) 12/06/2023 1233   ACIDBASEDEF 13.0  (H) 12/06/2023 1233   O2SAT 99 12/06/2023 1233     Coagulation Profile: No results for input(s): "INR", "PROTIME" in the last 168 hours.  Cardiac Enzymes: No results for input(s): "CKTOTAL", "  CKMB", "CKMBINDEX", "TROPONINI" in the last 168 hours.  HbA1C: HbA1c, POC (prediabetic range)  Date/Time Value Ref Range Status  09/21/2023 03:50 PM 6.2 5.7 - 6.4 % Final  06/08/2022 03:07 PM 6.1 5.7 - 6.4 % Final   Hgb A1c MFr Bld  Date/Time Value Ref Range Status  10/19/2023 03:46 PM 6.6 (H) 4.8 - 5.6 % Final    Comment:             Prediabetes: 5.7 - 6.4          Diabetes: >6.4          Glycemic control for adults with diabetes: <7.0     CBG: Recent Labs  Lab 12/05/23 1306 12/06/23 1203  GLUCAP 143* 66*    Review of Systems:   Unable to obtain,  intubated post arrest   Past Medical History:  She,  has a past medical history of Abnormal Pap smear, Allergy, Breast pain, left (06/09/2022), Elevated blood pressure reading (09/06/2021), GERD (gastroesophageal reflux disease), Migraines, Plantar fasciitis, bilateral, Seasonal allergies, Shortness of breath, Shoulder pain, right, and 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   Surgical History:   Past Surgical History:  Procedure Laterality Date   ABDOMINAL HYSTERECTOMY     BLADDER SUSPENSION  2009   CORONARY PRESSURE/FFR STUDY N/A 06/13/2022   Procedure: INTRAVASCULAR PRESSURE WIRE/FFR STUDY;  Surgeon: Lyn Records, MD;  Location: [facility] IN[facility]ASIVE [facility];  Service: Cardiovascular;  Laterality: N/A;   DIAGNOSTIC LAPAROSCOPY     ectopic pregnancy   DILATION AND CURETTAGE OF UTERUS     hx mab   endoscopy nasal sinus surgery  01/13/2020   [facility] - CFS Leak Repair   LEFT HEART CATH AND CORONARY ANGIOGRAPHY N/A 06/13/2022   Procedure: LEFT HEART CATH AND CORONARY ANGIOGRAPHY;  Surgeon: Lyn Records, MD;  Location: [facility] IN[facility] CV [facility];  Service: Cardiovascular;  Laterality: N/A;   SACROSPINOUS LIGAMENT FIXATION     posterior repair wtih cysto   SVD      x 2   TUBAL LIGATION  1999   VAGINAL HYSTERECTOMY  03/13/2012   Procedure: HYSTERECTOMY VAGINAL;  Surgeon: Catalina Antigua, MD;  Location: [facility] OR[facility];  Service: Gynecology;  Laterality: N/A;     Social History:   reports that she quit smoking about 8 years ago. Her smoking use included cigarettes. She started smoking about 42 years ago. She has a 34 pack-year smoking history. She has never used smokeless tobacco. She reports current alcohol use of about 2.0 standard drinks of alcohol per week. She reports that she does not use drugs.   Family History:  Her family history includes Cancer in her father; Colon cancer (age of onset: 33) in her mother; Diabetes in her maternal grandmother; Heart disease in her maternal grandmother; Other in her mother. There is no history of Rectal cancer or Stomach cancer.   Allergies Allergies  Allergen Reactions   Latex Itching and Dermatitis   Norco [Hydrocodone-Acetaminophen] Other (See Comments)    CNS dysphoria   Percocet [Oxycodone-Acetaminophen] Other (See Comments)    CNS Dysphoria     Home Medications  Prior to Admission medications   Medication Sig Start Date End Date Taking? Authorizing Provider  fluticasone (FLONASE) 50 MCG/ACT nasal spray Place 1 spray into both nostrils daily. Patient taking differently: Place 1 spray into both nostrils daily as needed for allergies. 09/21/23  Yes Alfredo Martinez, MD  isosorbide mononitrate (IMDUR) 60 MG 24 hr tablet Take 1 tablet (60 mg total) by mouth daily. 09/21/23 12/20/23  Yes Alfredo Martinez, MD  omeprazole (PRILOSEC OTC) 20 MG tablet Take 20 mg by mouth daily as needed (heartburn, acid reflux). 11/05/19  Yes [provider]  pseudoephedrine (SUDAFED) 30 MG tablet Take 30-60 mg by mouth every 4 (four) hours as needed for congestion.   Yes [provider]  rosuvastatin (CRESTOR) 10 MG tablet Take 1 tablet (10 mg total) by mouth 3 (three) times a week. Patient not taking: Reported on 12/05/2023  10/26/23 10/24/24  Darral Dash, DO     Critical care time: 50 min        CRITICAL CARE Performed by: Lanier Clam   Total critical care time: 50 minutes  Critical care time was exclusive of separately billable procedures and treating other patients. Critical care was necessary to treat or prevent imminent or life-threatening deterioration.  Critical care was time spent personally by me on the following activities: development of treatment plan with patient and/or surrogate as well as nursing, discussions with consultants, evaluation of patient's response to treatment, examination of patient, obtaining history from patient or surrogate, ordering and performing treatments and interventions, ordering and review of laboratory studies, ordering and review of radiographic studies, pulse oximetry and re-evaluation of patient's condition.  Tessie Fass MSN, AGACNP-BC [facility] Pulmonary/Critical Care Medicine Amion for pager  12/06/2023, 1:45 PM

## 2023-12-06 NOTE — ED Notes (Signed)
Patient c/o chest pain. RN notified.

## 2023-12-06 NOTE — Plan of Care (Addendum)
Spoke with Lillia Abed with Cardiology - discussed new T wave inversions in inferior distribution. Intermittent chest pain overnight. Started on heparin drip for now. Holding home imdur, giving ASA and statin. Consider antiplatelet if having pain this AM.

## 2023-12-06 NOTE — ED Notes (Addendum)
Patient c/o Chest pain at 0943am. RN notified.

## 2023-12-06 NOTE — Progress Notes (Signed)
I was notified about ongoing code by Chaplains Daron Offer and Dyane Dustman. I responded and encountered family in the lobby of [facility]. I was debriefed of the situation by Leanora Cover as she spoke with the family.   Family is tearful. They are feeling upset and frustrated due to the lack of information they feel they aren't receiving. Daughters walked in on second code alert, which has negatively affected them.   Pt was taken to the Cath Lab. Maximino Sarin and I met with the family afterwards in order to alert them to pt's movement. Doctors soon came out in order to provide family with an update. I then checked in with family afterwards to assess level of distress. Family spoke about how difficult it has been for doctors to find diagnoses for many persons in their family, saying "we're just built different"  Chaplain provided family pillows

## 2023-12-06 NOTE — Assessment & Plan Note (Addendum)
Sudden onset confusion 8am 12/05/23 with collapse, unknown LOC. CT head negative for acute pathology. CXR negative for acute abnormality. Lactic acid 2.5. UDS positive for THC. ETOH<10. Ammonia 19. U/A positive for nitrites, s/p Rocephin x1 dose in the ED. Suspect this could be a seizure given reported story, however she does not have a history of seizures.  EEG negative for seizure.  MRI brain unremarkable. Troponins slightly elevated at 161 in the ED, however trended down.  EKG this morning with new T wave inversions so cardiology was consulted, gave patient 325 mg aspirin, added 80 mg atorvastatin, and started heparin per pharmacy - cardiology consulted - consider neurology consult (concern for seizure) - f/u echo - Med-telemetry - troponins - trend lactate - Urine culture - Blood cultures - Cardiac telemetry - Caution CNS acting medications - CIWAs - Delirium, fall, seizure precautions - AM CMP, CBC

## 2023-12-06 NOTE — Procedures (Signed)
Patient Name: Deanna Scott  MRN: [id number]  Epilepsy Attending: Charlsie Quest  Referring Physician/Provider: Para March, DO  Date: 12/06/2023 Duration: 22.10 mins  Patient history:  53 y.o. female PMH migraines, HTN, chronic dizziness (unclear cause), and CSF leak with endoscopic repair in 2021, urinary incontinence s/p suburethral sling placement  presenting with altered mental status. EEG to evaluate for seizure  Level of alertness: Awake, asleep  AEDs during EEG study: None  Technical aspects: This EEG study was done with scalp electrodes positioned according to the 10-20 International system of electrode placement. Electrical activity was reviewed with band pass filter of 1-70Hz , sensitivity of 7 uV/mm, display speed of 44mm/sec with a 60Hz  notched filter applied as appropriate. EEG data were recorded continuously and digitally stored.  Video monitoring was available and reviewed as appropriate.  Description: The posterior dominant rhythm consists of 9 Hz activity of moderate voltage (25-35 uV) seen predominantly in posterior head regions, symmetric and reactive to eye opening and eye closing. Sleep was characterized by vertex waves, sleep spindles (12 to 14 Hz), maximal frontocentral region. EEG showed intermittent generalized polymorphic 3 to 6 Hz theta-delta slowing. Hyperventilation and photic stimulation were not performed.     ABNORMALITY - Intermittent slow, generalized  IMPRESSION: This study is suggestive of mild diffuse encephalopathy. No seizures or epileptiform discharges were seen throughout the recording.  Please note lack of epileptiform activity during interictal EEG does not exclude the diagnosis of epilepsy.  Koi Yarbro Annabelle Harman

## 2023-12-06 NOTE — Consult Note (Addendum)
Advanced Heart Failure Team Consult Note   Primary Physician: Darral Dash, DO Cardiologist:  Chilton Si, MD  Reason for Consultation: Vfib arrest  HPI:    Deanna Scott is seen today for evaluation of Vfib arrest at the request of Dr. Jens Som with [facility]. 53 y.o. female with history of chest pain (normal cors on cath 08/230, GERD, HLD, tobacco use, obesity, urinary incontinence s/p bladder sling, repaired CSF leak.   Presented to the ED via EMS yestrerday am. Had sudden onset of confusion, shortness of breath and dizziness around 8 am.  She was peaking gibberish on the phone with her daughter. Daughter heard the phone drop. Patient's mother, who lives with her called EMS. Had bowel and bladder incontinence after the fall.  There was concern for possible afib en route. Encephalopathic on arrival. CT head negative. MRI brain no acute finding. UA concerning for possible UTI and given IV ceftriaxone. Labs significant for lactic acid 2.5, K 4.6, Cr 0.87, Na 138, HS troponin 161>136>118, ETOH < 10, UDS + for marijuana. She was admitted for further workup.  Mental status improved overnight. Reproted episodes of chest pain this morning. ECG with new T wave inversions in inferolateral leads. She walked to the bathroom and came back to bed and had episode of suspected seizure activity. On telemtry she had episodes of complete heart block with wide complex beats terminating into VF. CPR started, given multiple rounds of epinephrine and bicarb and ROSC was achieved. Intubated in ED. I STAT labs during CPR notable for k 2.2, bicarb 13. TEE: EF 40-45%, septal AK, no aoritc dissection. Emergent cardiac cath with no significant CAD.  Home Medications Prior to Admission medications   Medication Sig Start Date End Date Taking? Authorizing Provider  fluticasone (FLONASE) 50 MCG/ACT nasal spray Place 1 spray into both nostrils daily. Patient taking differently: Place 1 spray into both  nostrils daily as needed for allergies. 09/21/23  Yes Alfredo Martinez, MD  isosorbide mononitrate (IMDUR) 60 MG 24 hr tablet Take 1 tablet (60 mg total) by mouth daily. 09/21/23 12/20/23 Yes Alfredo Martinez, MD  omeprazole (PRILOSEC OTC) 20 MG tablet Take 20 mg by mouth daily as needed (heartburn, acid reflux). 11/05/19  Yes [provider]  pseudoephedrine (SUDAFED) 30 MG tablet Take 30-60 mg by mouth every 4 (four) hours as needed for congestion.   Yes [provider]  rosuvastatin (CRESTOR) 10 MG tablet Take 1 tablet (10 mg total) by mouth 3 (three) times a week. 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   Qualifier: Diagnosis of  By: Benjamin Stain MD, Maisie Fus      Past Surgical History: Past Surgical History:  Procedure Laterality Date   ABDOMINAL HYSTERECTOMY     BLADDER SUSPENSION  2009   CORONARY PRESSURE/FFR STUDY N/A 06/13/2022   Procedure: INTRAVASCULAR PRESSURE WIRE/FFR STUDY;  Surgeon: Lyn Records, MD;  Location: [facility] INVASIVE CV [facility];  Service: Cardiovascular;  Laterality: N/A;   DIAGNOSTIC LAPAROSCOPY     ectopic pregnancy   DILATION AND CURETTAGE OF UTERUS     hx mab   endoscopy nasal sinus surgery  01/13/2020   [facility] - CFS Leak Repair   LEFT HEART CATH AND CORONARY ANGIOGRAPHY N/A 06/13/2022   Procedure: LEFT HEART CATH AND CORONARY ANGIOGRAPHY;  Surgeon: Lyn Records, MD;  Location: [facility] INVASIVE CV LAB;  Service: Cardiovascular;  Laterality: N/A;   SACROSPINOUS LIGAMENT FIXATION     posterior repair  wtih cysto   SVD     x 2   TUBAL LIGATION  1999   VAGINAL HYSTERECTOMY  03/13/2012   Procedure: HYSTERECTOMY VAGINAL;  Surgeon: Catalina Antigua, MD;  Location: [facility];  Service: Gynecology;  Laterality: N/A;    Family History: Family History  Problem Relation Age of Onset   Other Mother        balance issues from a "thing in her brain" requires walker   Colon cancer Mother 46       dx 2019   Cancer Father    Heart disease Maternal Grandmother    Diabetes Maternal Grandmother    Rectal cancer Neg Hx    Stomach cancer Neg Hx     Social History: Social History   Socioeconomic History   Marital status: Divorced    Spouse name: Not on file   Number of children: Not on file   Years of education: Not on file   Highest education level: 12th grade  Occupational History   Not on file  Tobacco Use   Smoking status: Former    Current packs/day: 0.00    Average packs/day: 1 pack/day for 34.0 years (34.0 ttl pk-yrs)    Types: Cigarettes    Start date: 09/03/1981    Quit date: 09/04/2015    Years since quitting: 8.2   Smokeless tobacco: Never   Tobacco comments:    Started age 79 - quit age 71  Vaping Use   Vaping status: Former  Substance and Sexual Activity   Alcohol use: Yes    Alcohol/week: 2.0 standard drinks of alcohol    Types: 2 Standard drinks or equivalent per week    Comment: socially   Drug use: No   Sexual activity: Yes    Birth control/protection: Surgical    Comment: Hysterectomy  Other Topics Concern   Not on file  Social History Narrative   Lives at home with 2 dogs and 1 cat   Right handed   Social Drivers of Health   Financial Resource Strain: Medium Risk (01/10/2023)   Overall Financial Resource Strain (CARDIA)    Difficulty of Paying Living Expenses: Somewhat hard  Food Insecurity: Food Insecurity Present (01/10/2023)   Hunger Vital Sign    Worried About Running Out of Food in the Last Year: Sometimes true    Ran Out of Food in the Last Year:  Sometimes true  Transportation Needs: No Transportation Needs (01/10/2023)   PRAPARE - Administrator, Civil Service (Medical): No    Lack of Transportation (Non-Medical): No  Physical Activity: Not on file  Stress: Not on file  Social Connections: Not on file    Allergies:  Allergies  Allergen Reactions   Latex Itching and Dermatitis   Norco [Hydrocodone-Acetaminophen] Other (See Comments)    CNS dysphoria   Percocet [Oxycodone-Acetaminophen] Other (See Comments)    CNS Dysphoria    Objective:    Vital Signs:   Temp:  [97.8 F (  36.6 C)-98.1 F (36.7 C)] 97.8 F (36.6 C) (01/30 0615) Pulse Rate:  [31-136] 127 (01/30 1338) Resp:  [10-102] 22 (01/30 1338) BP: (72-163)/(57-141) 140/110 (01/30 1338) SpO2:  [52 %-100 %] 87 % (01/30 1338) FiO2 (%):  [100 %] 100 % (01/30 1403)    Weight change: There were no vitals filed for this visit.  Intake/Output:  No intake or output data in the 24 hours ending 12/06/23 1527    Physical Exam    General:  Critically ill appearing HEENT: + ETT, bloody secretions Physical exam limited: CPR w/ chest compressions in progress at time of assessment Neuro: Sedated on vent  EKG    3:42 am: Sinus brady   8:01 am: Sinus brady 57 bpm, new ST depression and inverted T waves inferolateral leads    Labs   Basic Metabolic Panel: Recent Labs  Lab 12/05/23 1003 12/05/23 1140 12/06/23 0402 12/06/23 1207 12/06/23 1212 12/06/23 1233  NA 138 139 135  --  149* 148*  K 4.6 3.6 3.5  --  2.2* 2.3*  CL 107  --  104  --  123*  --   CO2 22  --  19*  --   --   --   GLUCOSE 166*  --  118*  --  63*  --   BUN 14  --  12  --  6  --   CREATININE 0.87  --  0.74  --  0.50  --   CALCIUM 9.8  --  8.6*  --   --   --   MG  --   --   --  1.0*  --   --     Liver Function Tests: Recent Labs  Lab 12/05/23 1003 12/06/23 0402  AST 77* 32  ALT 38 24  ALKPHOS 111 82  BILITOT 0.8 0.9  PROT 7.1 6.4*  ALBUMIN 3.8 3.4*   No results for  input(s): "LIPASE", "AMYLASE" in the last 168 hours. Recent Labs  Lab 12/05/23 1217  AMMONIA 19    CBC: Recent Labs  Lab 12/05/23 1003 12/05/23 1140 12/06/23 0402 12/06/23 1212 12/06/23 1233  WBC 8.3  --  10.0  --   --   NEUTROABS 6.0  --   --   --   --   HGB 14.2 14.3 12.5 7.1* 7.5*  HCT 43.3 42.0 38.4 21.0* 22.0*  MCV 90.8  --  91.0  --   --   PLT 261  --  218  --   --     Cardiac Enzymes: No results for input(s): "CKTOTAL", "CKMB", "CKMBINDEX", "TROPONINI" in the last 168 hours.  BNP: BNP (last 3 results) No results for input(s): "BNP" in the last 8760 hours.  ProBNP (last 3 results) No results for input(s): "PROBNP" in the last 8760 hours.   CBG: Recent Labs  Lab 12/05/23 1306 12/06/23 1203  GLUCAP 143* 66*    Coagulation Studies: No results for input(s): "LABPROT", "INR" in the last 72 hours.   Imaging   ECHO TEE Result Date: 12/06/2023    TRANSESOPHOGEAL ECHO REPORT   Patient Name:   KAYTLYN DIN Date of Exam: 12/06/2023 Medical Rec #:  [id number]      Height:       62.0 in Accession #:    [id number]     Weight:       209.0 lb Date of Birth:  1971-08-16      BSA:  1.948 m Patient Age:    52 years       BP:           72/58 mmHg Patient Gender: F              HR:           130 bpm. Exam Location:  Inpatient Procedure: Transesophageal Echo, Color Doppler and Cardiac Doppler Indications:     I50.9* Heart failure (unspecified)  History:         Patient has no prior history of Echocardiogram examinations.                  Arrythmias:Cardiac Arrest; Risk Factors:Hypertension and                  Dyslipidemia.  Sonographer:     Irving Burton Senior RDCS Referring Phys:  [id number] Yates Decamp Diagnosing Phys: Olga Millers MD  Sonographer Comments: Emergent on cath table PROCEDURE: After discussion of the risks and benefits of a TEE, an informed consent was obtained from the patient. The transesophogeal probe was passed without difficulty through the esophogus of the patient.  Sedation performed by different physician. The patient was monitored while under deep sedation. The patient developed no complications during the procedure.  IMPRESSIONS  1. Procedure done emergently in cath lab to R/O dissection; no dissection noted; EF 40-45 with septal akinesis.  2. Left ventricular ejection fraction, by estimation, is 40 to 45%. The left ventricle has mildly decreased function. The left ventricle demonstrates regional wall motion abnormalities (see scoring diagram/findings for description).  3. Right ventricular systolic function is normal. The right ventricular size is normal.  4. The mitral valve is normal in structure. Trivial mitral valve regurgitation.  5. The aortic valve is tricuspid. Aortic valve regurgitation is not visualized.  6. Aortic dilatation noted. There is borderline dilatation of the ascending aorta, measuring 38 mm. FINDINGS  Left Ventricle: Left ventricular ejection fraction, by estimation, is 40 to 45%. The left ventricle has mildly decreased function. The left ventricle demonstrates regional wall motion abnormalities. The left ventricular internal cavity size was normal in size. Right Ventricle: The right ventricular size is normal. Right ventricular systolic function is normal. Left Atrium: Left atrial size was normal in size. Right Atrium: Right atrial size was normal in size. Pericardium: There is no evidence of pericardial effusion. Mitral Valve: The mitral valve is normal in structure. Trivial mitral valve regurgitation. Tricuspid Valve: The tricuspid valve is normal in structure. Tricuspid valve regurgitation is trivial. Aortic Valve: The aortic valve is tricuspid. Aortic valve regurgitation is not visualized. Pulmonic Valve: The pulmonic valve was normal in structure. Aorta: Aortic dilatation noted. There is borderline dilatation of the ascending aorta, measuring 38 mm. IAS/Shunts: No atrial level shunt detected by color flow Doppler. Additional Comments: Procedure  done emergently in cath lab to R/O dissection; no dissection noted; EF 40-45 with septal akinesis. Spectral Doppler performed. Olga Millers MD Electronically signed by Olga Millers MD Signature Date/Time: 12/06/2023/2:19:07 PM    Final    CARDIAC CATHETERIZATION Result Date: 12/06/2023 Left Heart Catheterization 12/06/23: Hemodynamic data: LVEDP 6 mmHg, no pressure gradient across the aortic valve. Angiographic data: LV: Mild decrease in LVEF, inferoseptal hypokinesis.  No significant mitral regurgitation. RCA: Large-caliber vessel, smooth and normal. LAD: Gives origin to a very large diagonal 1 and several small diagonals.  It is smooth and normal. LCx: Large-caliber vessel, continues his OM1 and OM 2 after giving origin to a small AV groove branch.  Smooth and normal. Impression and recommendations: Findings consistent with nonischemic cardiomyopathy with very mild inferoseptal hypokinesis with normal EDP, EF 45% approximately.   EEG adult Result Date: 12/06/2023 Charlsie Quest, MD     12/06/2023  8:24 AM Patient Name: Deanna Scott MRN: [id number] Epilepsy Attending: Charlsie Quest Referring Physician/Provider: Para March, DO Date: 12/06/2023 Duration: 22.10 mins Patient history:  53 y.o. female PMH migraines, HTN, chronic dizziness (unclear cause), and CSF leak with endoscopic repair in 2021, urinary incontinence s/p suburethral sling placement  presenting with altered mental status. EEG to evaluate for seizure Level of alertness: Awake, asleep AEDs during EEG study: None Technical aspects: This EEG study was done with scalp electrodes positioned according to the 10-20 International system of electrode placement. Electrical activity was reviewed with band pass filter of 1-70Hz , sensitivity of 7 uV/mm, display speed of 71mm/sec with a 60Hz  notched filter applied as appropriate. EEG data were recorded continuously and digitally stored.  Video monitoring was available and reviewed as appropriate.  Description: The posterior dominant rhythm consists of 9 Hz activity of moderate voltage (25-35 uV) seen predominantly in posterior head regions, symmetric and reactive to eye opening and eye closing. Sleep was characterized by vertex waves, sleep spindles (12 to 14 Hz), maximal frontocentral region. EEG showed intermittent generalized polymorphic 3 to 6 Hz theta-delta slowing. Hyperventilation and photic stimulation were not performed.   ABNORMALITY - Intermittent slow, generalized IMPRESSION: This study is suggestive of mild diffuse encephalopathy. No seizures or epileptiform discharges were seen throughout the recording. Please note lack of epileptiform activity during interictal EEG does not exclude the diagnosis of epilepsy. Priyanka Annabelle Harman     Medications:     Current Medications:  [START ON 12/07/2023] aspirin EC  81 mg Oral Daily   atorvastatin  80 mg Oral Daily   atropine       Chlorhexidine Gluconate Cloth  6 each Topical Daily   docusate  100 mg Per Tube BID   EPINEPHrine NaCl       famotidine  20 mg Per Tube BID   fentaNYL (SUBLIMAZE) injection  50 mcg Intravenous Once   multivitamin with minerals  1 tablet Oral Daily   pantoprazole (PROTONIX) IV  40 mg Intravenous QHS   polyethylene glycol  17 g Per Tube Daily   rocuronium       senna  1 tablet Oral Daily   [START ON 12/07/2023] sodium chloride flush  3 mL Intravenous Q12H   vasopressin        Infusions:  sodium chloride     sodium chloride 10 mL/hr at 12/06/23 1430   [START ON 12/07/2023] sodium chloride     sodium chloride     ampicillin-sulbactam (UNASYN) IV     calcium gluconate 2,000 mg (12/06/23 1429)   calcium gluconate in NaCl     dexmedetomidine (PRECEDEX) IV infusion     heparin Stopped (12/06/23 1208)   norepinephrine (LEVOPHED) Adult infusion 2 mcg/min (12/06/23 1512)   potassium chloride 10 mEq (12/06/23 1501)      Patient Profile   53 y.o. female with history of chronic chest pain (prior cath with  clean cors 2023), tobacco use, GERD, obesity, urinary incontinence s/p bladder sling, repaired CSF leak.  Admitted 12/05/23 with AMS and syncopal episode at home. Subsequently developed chest pain AM of 01/30 with T wave inversions in inferolateral leads on ECG. Later had CHB >>> resuscitated VF arrest.   Assessment/Plan   VF arrest: -Etiology unclear. Prior to VF  arrest had new inferolateral T wave inversions and ST depression on ECG. This was followed by CHB and Vfib arrest. -Cardiac cath with no significant CAD -Echo: EF 40-45%, septal AK, no aortic dissection -K 2.2 on iSTAT during arrest, had previously been normal -Check inflammatory markers, ESR/CRP -Will need cMRI once more stable -May require ICD implant prior to discharge  2. Lactic acidosis: -Post arrest lactic acid 4.8>>3.7, trend to clearance  3. Acute respiratory failure with hypercarbia and hypoxia -Vent management per CCM -Unasyn for possible aspiration  4. Hypokalemia Hypomagnesemia Hypocalcemia -Noted on iSTAT -Lytes supplemented  5. Anemia -Hgb 12.5 on CBC, 7 on iSTAT -Recheck pending  6. Possible UTI -Culture pending -Abx per CCM  ADDENDUM 6:10 PM: Around 4:40 PM code blue activated for pulseless VT. CCM and Advanced Heart Failure immediately arrived.  CPR started. Received several rounds of epi, bicarb and multiple shocks. Rhythm later CHB. Transcutaneous pacing started. ROSC achieved after 15 minutes.   Prior to event, rhythm was sinus with frequent ectopy >> VT appears to have been triggered by R on T phenomenon.  She was transferred to the cath lab for temp pacer, Swan placement and RHC to assess hemodynamics.    Length of Stay: 0  Oluwatoni Rotunno N, PA-C  12/06/2023, 3:27 PM  Advanced Heart Failure Team Pager [phone] (M-F; 7a - 5p)  Please contact [facility] for night-coverage after hours (4p -7a ) and weekends on [email or web address]

## 2023-12-06 NOTE — ED Provider Notes (Signed)
53 year old female who was admitted for UTI and possible NSTEMI initially who I was called to evaluate the patient for possible seizure activity.  On my assessment the patient is looking to the left does have a gaze preference with some rhythmic activity of her mouth.  She is given atropine with improvement in her heart rate.  She given a small dose of Ativan in the event that this was due to seizure activity.  Initially her heart rate was responding to atropine.  She then went into what appeared to be a second-degree AV block on the monitor.  Pads were placed on the patient and initially she did have a faint pulse.  She did lose pulses pulses and ACLS protocol was followed.  The patient was bagged until respiratory therapy was able to arrive.  She was subsequently intubated.  Eventually the patient did have return of spontaneous circulation after multiple rounds of epinephrine and bicarbonate.  The patient's glucose was noted to be low and she was given initial amp of dextrose.  On her Chem-8 panel the glucose was also noted to be low so she was given additional amp of dextrose.  The patient did have return of spontaneous circulation.  She does have a lot of bloody secretions from the ET tube on intubation.  I did perform a bedside ultrasound and did not appreciate any significant RV strain and no significant pericardial effusion.  Her case is discussed with the intensivist and she is admitted to the ICU for further evaluation management.  INTUBATION Performed by: Durwin Glaze  Required items: required blood products, implants, devices, and special equipment available Patient identity confirmed: provided demographic data and hospital-assigned identification number Time out: Immediately prior to procedure a "time out" was called to verify the correct patient, procedure, equipment, support staff and site/side marked as required.  Indications: Hypoxia, not protecting airway  Intubation method: Glidescope  Laryngoscopy   Preoxygenation: BVM  Tube Size: 7.5 cuffed  Post-procedure assessment: chest rise and ETCO2 monitor Breath sounds: equal and absent over the epigastrium Tube secured with: ETT holder Chest x-ray ordered at time patient care handed over to ICU   Patient tolerated the procedure well with no immediate complications.  CRITICAL CARE Performed by: Durwin Glaze   Total critical care time: 35 minutes  Critical care time was exclusive of separately billable procedures and treating other patients.  Critical care was necessary to treat or prevent imminent or life-threatening deterioration.  Critical care was time spent personally by me on the following activities: development of treatment plan with patient and/or surrogate as well as nursing, discussions with consultants, evaluation of patient's response to treatment, examination of patient, obtaining history from patient or surrogate, ordering and performing treatments and interventions, ordering and review of laboratory studies, ordering and review of radiographic studies, pulse oximetry and re-evaluation of patient's condition.      Durwin Glaze, MD 12/06/23 1240

## 2023-12-06 NOTE — Progress Notes (Signed)
Pt transported from Cath lab to 2H via vent with no complications noted.

## 2023-12-06 NOTE — Progress Notes (Signed)
ABG obtained for 2000, pH 7.156. Immediately handed results to Dr. Elwyn Lade at bedside. Provider then adjusted ventilator settings- FiO2 100%, PEEP 12, Resp Rate 32, Tidal volume 440. Verbal order to repeat arterial blood gas at 2200. Dr. Elwyn Lade also notified of Point of Care lactic acid result 7.5. lactated ringers bolus infusing per verbal order.

## 2023-12-06 NOTE — Assessment & Plan Note (Signed)
Afib in EMS EKG, converted to NSR here. No therapeutic anticoagulation indicated at this time. -Monitor on tele -Echo

## 2023-12-06 NOTE — Progress Notes (Addendum)
In cath lab had vent synchrony issues- required NMB for refractory hypercapnia on VBG and double stacking. Synchronous after with improvement in PCO2.  Vasopressors titrated down with central access and arterial line for monitoring.  K+ repletion accelerated in cath lab due to profound hypokalemia, likely contributing to her cardiac arrest.  Multiple grams of magnesium previously given during code, in ICU.  Steroids given for possible myocarditis. Unable to treat hyperglycemia that will likely worsen overnight due to hypokalemia. Once K+ starts rising, anticipate she will need an insulin infusion.  BMP, Mg+ q6h overnight.   During code had drugs infiltrate from left arm IV. All drips had been moved to IO line in leg. Phentolamine ordered for L arm. Need to monitoring carefully-- at risk for skin necrosis and edema from infiltrated fluids.  Additional cc time: 50 min.  Steffanie Dunn, DO 12/06/23 7:04 PM [facility]  For contact information, see Amion. If no response to pager, please call PCCM consult pager. After hours, 7PM- 7AM, please call Elink.

## 2023-12-06 NOTE — Plan of Care (Signed)
Call from radiologist re ETT in R mainstem. CXR personally reviewed, order placed to pull back ETT 6 cm. Dw RT on unit   Tessie Fass MSN, AGACNP-BC [facility] 12/06/2023, 3:35 PM

## 2023-12-06 NOTE — Plan of Care (Signed)
FMTS Interim Progress Note  S: Went to bedside with Dr. Jena Gauss. Patient was resting comfortably in bed. No concerns other than a headache at this time. She feels closer to her baseline at this time confirmed by family at bedside. They report that she is still having some nonsensical speech occasionally, but much improved from earlier.   O: BP (!) 153/90   Pulse 61   Temp 97.9 F (36.6 C) (Oral)   Resp 15   LMP 03/02/2012   SpO2 97%   General: Awake and Alert in NAD HEENT: NCAT. Sclera anicteric. Neuro: AAOx4. EOMI. No focal neurological deficits.   A/P: AMS Seems to be much improved from arrival. Confirmed by family as well. CT head and MRI brain negative except for small amount of fluid in sphenoid sinus for which nothing needs to be done acutely. - f/u EEG results  Elevated Troponin Initial troponin elevated to 161, awaiting repeat troponins.  - EKG ordered - f/u troponins  Headache - Compazine and Tylenol ordered  Rest of plan per H&P.  Fortunato Curling, DO 12/06/2023, 3:50 AM PGY-1, [facility] Service pager [phone]

## 2023-12-06 NOTE — Progress Notes (Signed)
Pharmacy Antibiotic Note  Deanna Scott is a 53 y.o. female admitted on 12/05/2023 with sepsis -r/o bacteremia  Pharmacy has been consulted for Vancomycin and Cefepime dosing. Pt on Day #2 of Rocephin for CAP.  Plan: Cefepime 2gm IV q12h Vancomycin 2000 mg IV now then 1000 mg IV Q 24 hrs. Goal AUC 400-550. Expected AUC: 548, SCr used: 1.24 Will f/u renal function, micro data, and pt's clinical condition Vanc levels at Css in obese pt      Temp (24hrs), Avg:97.9 F (36.6 C), Min:97.8 F (36.6 C), Max:97.9 F (36.6 C)  Recent Labs  Lab 12/05/23 1003 12/05/23 1140 12/06/23 0402 12/06/23 1212 12/06/23 1335 12/06/23 1336 12/06/23 1525 12/06/23 1734 12/06/23 1829 12/06/23 1850 12/06/23 2004 12/06/23 2017  WBC 8.3  --  10.0  --   --   --  24.9*  --   --  45.0*  --   --   CREATININE 0.87  --  0.74 0.50 0.90  --  1.14* 1.22* 1.10*  --  1.24*  --   LATICACIDVEN  --    < >  --  1.1  --  4.8* 3.7*  --  6.7*  --   --  7.5*   < > = values in this interval not displayed.    Estimated Creatinine Clearance: 55.7 mL/min (A) (by C-G formula based on SCr of 1.24 mg/dL (H)).    Allergies  Allergen Reactions   Latex Itching and Dermatitis   Norco [Hydrocodone-Acetaminophen] Other (See Comments)    CNS dysphoria   Percocet [Oxycodone-Acetaminophen] Other (See Comments)    CNS Dysphoria    Antimicrobials this admission: 1/30 Vanc >>  1/30 Cefepime >>  1/29 Rocephin >>1/30 1/30 Unasyn x 1  Microbiology results: 1/29 BCx:  1/29 UCx:   1/30 MRSA PCR: negative  Thank you for allowing pharmacy to be a part of this patient's care.  Christoper Fabian, PharmD, BCPS Please see amion for complete clinical pharmacist phone list 12/06/2023 10:18 PM

## 2023-12-06 NOTE — Progress Notes (Signed)
**This note was written prior to pt's code blue event, see other notes and attending attestation for further details of event**  Daily Progress Note Intern Pager: [phone]  Patient name: Deanna Scott Medical record number: [id number] Date of birth: [birth year] Age: 53 y.o. Gender: female  Primary Care Provider: Darral Dash, DO Consultants: cardiology Code Status: Full  Pt Overview and Major Events to Date:  1/29 - admitted  Assessment and Plan:   53 y.o. female PMH migraines, HTN, chronic dizziness (unclear cause), and CSF leak with endoscopic repair in 2021, urinary incontinence s/p suburethral sling placement presenting with altered mental status and collapse yesterday 1/29.  Assessment & Plan Altered mental status Sudden onset confusion 8am 12/05/23 with collapse, unknown LOC. CT head negative for acute pathology. CXR negative for acute abnormality. Lactic acid 2.5. UDS positive for THC. ETOH<10. Ammonia 19. U/A positive for nitrites, s/p Rocephin x1 dose in the ED. Suspect this could be a seizure given reported story, however she does not have a history of seizures.  EEG negative for seizure.  MRI brain unremarkable. Troponins slightly elevated at 161 in the ED, however trended down.  EKG this morning with new T wave inversions so cardiology was consulted, gave patient 325 mg aspirin, added 80 mg atorvastatin, and started heparin per pharmacy - cardiology consulted - consider neurology consult (concern for seizure) - f/u echo - Med-telemetry - troponins - trend lactate - Urine culture - Blood cultures - Cardiac telemetry - Caution CNS acting medications - CIWAs - Delirium, fall, seizure precautions - AM CMP, CBC Urinary tract infection Urinalysis with positive nitrites, although 11-20 epithelial cells. Treated with Rocephin x1 dose in the ED. Daughters report pt has been complaining of some urinary frequency lately but no dysuria or hematuria. Pt denies urinary sx.  -  Urine culture - Reassess need for antibiotics Acute respiratory alkalosis VBG 7.58, CO2 18.8, bicarb 17.7. Suspect in setting of pt's new encephalitis vs potential stroke. No hypoxia or respiratory issues on exam. Ammonia normal, LFTs unremarkable, will obtain hepatitis panel. - F/u MRI - Continue to monitor Transaminitis AST 77. Likely alcohol vx fatty liver contributing. -Hepatitis panel -CMP New onset a-fib (HCC) Afib in EMS EKG, converted to NSR here. No therapeutic anticoagulation indicated at this time. -Monitor on tele -Echo    Chronic and Stable Problems:  HTN - takes Imdur 60mg  daily, holding Migraines - no rx medications at home other than PRN Excedrin GERD - reports not taking home meds BPPV - reports not taking home meds HLD - reports not taking home meds Hx of bloody stools - none since admission, Colonoscopy 2021 with non-cancerous polyps, likely from hemmorhoids, trend CBC   FEN/GI: regular diet PPx: heparin Dispo:Home pending clinical improvement .   Subjective:  NAEON. Feeling overall well this morning, daughters state her mentation is back to baseline. Pt reports some mild, intermittent chest pain when she walks. No chest pain currently.   Now that pt is back to baseline mentation, she provides more clarification regarding the event that brought her to the hospital: States that she developed nausea and chest pain prior to the event. She vomited x1 and then had bowel/bladder incontinence due to the episode of vomiting.  Objective: Temp:  [97.8 F (36.6 C)-98.6 F (37 C)] 97.8 F (36.6 C) (01/30 0615) Pulse Rate:  [52-76] 52 (01/30 0635) Resp:  [14-22] 14 (01/30 0635) BP: (114-153)/(41-90) 133/74 (01/30 0635) SpO2:  [97 %-100 %] 100 % (01/30 2725) Physical Exam: General: well appearing,  NAD, speaking in complete sentences and conversational Cardiovascular: RRR, no m/r/g, normal S1 and S2 Respiratory: normal work of breathing on RA, CTAB Abdomen: Normal  bowel sounds, soft, non-tender Extremities: No swelling BLE  Laboratory: Most recent CBC Lab Results  Component Value Date   WBC 10.0 12/06/2023   HGB 12.5 12/06/2023   HCT 38.4 12/06/2023   MCV 91.0 12/06/2023   PLT 218 12/06/2023   Most recent BMP    Latest Ref Rng & Units 12/06/2023    4:02 AM  BMP  Glucose 70 - 99 mg/dL 161   BUN 6 - 20 mg/dL 12   Creatinine 0.96 - 1.00 mg/dL 0.45   Sodium 409 - 811 mmol/L 135   Potassium 3.5 - 5.1 mmol/L 3.5   Chloride 98 - 111 mmol/L 104   CO2 22 - 32 mmol/L 19   Calcium 8.9 - 10.3 mg/dL 8.6    Troponin 914>[id number]>21  Imaging/Diagnostic Tests: MR Brain WO Contrast 12/05/23 IMPRESSION: 1. No acute intracranial abnormality. 2. Small amount of fluid in the left sphenoid sinus. Given prior history of CSF leak, clinical correlation is suggested for recurrent CSF leak versus inflammatory disease.  CT Head WO contrast 12/05/23 IMPRESSION: No evidence of acute intracranial abnormality.  CXR 12/05/23 IMPRESSION: Low lung volumes.  No acute cardiopulmonary findings.  Kahle Mcqueen, DO 12/06/2023, 7:03 AM  PGY-1, [facility] FPTS Intern pager: [phone], text pages welcome Secure chat group [facility] Teaching Service

## 2023-12-06 NOTE — Plan of Care (Addendum)
FMTS Interim Progress Note  Called and spoke with PCCM concern for PE per EDP - currently on heparin drip no tpA contraindications (no active bleeding, no recent trauma, no recent CVA) Head CT and MRI negative. Hx of CSF leak but not active.. Bedside ultrasound present. They will come to evaluate  Deanna Erp, MD 12/06/2023, 12:17 PM PGY-3, [facility] Family Medicine Service pager [phone]

## 2023-12-06 NOTE — Consult Note (Signed)
Cardiology Consultation   Patient ID: Deanna Scott MRN: [id number]; DOB: Jun 15, 1971  Admit date: 12/05/2023 Date of Consult: 12/06/2023  PCP:  Deanna Dash, DO    [facility] Providers Cardiologist:  Chilton Si, MD     Patient Profile:   Deanna Scott is a 53 y.o. female with a hx of GERD, HLD, HTN, tobacco use, migraines who is being seen 12/06/2023 for the evaluation of chest pain/elevated troponin at the request of Dr. Manson Passey.  History of Present Illness:   [name] is a 53 yo female with PMH noted above. She has been followed by Dr. Duke Salvia as an outpatient.  She underwent cardiac catheterization 06/2022 in the setting of chest pain and noted to have normal coronaries with normal luminal irregularity.  She was seen back in follow-up and her losartan was stopped with Imdur started.  Last seen in the office 10/2022 and noted to continue to have episodes of atypical angina which resolved with belching.  She was continued on Imdur, Crestor.  Presented to the ED on 1/29 EMS after an episode of syncope.  Per chart review patient was altered on arrival and most of the history was obtained from family at the bedside.  In review of tracings via EMS from 1/29 there was concern for possible atrial fibrillation though does appear to have sinus beats, some wide-complex beats noted in 1 tracing.  Show ECG with EMS showed sinus arrhythmia with peaked T waves.  Repeat EKG shows improvement.  In the ED her labs showed sodium 138, potassium 4.6, creatinine 0.87, lactic acid 2.5, WBC 8.3, hemoglobin 14.2, TSH 2.035, high-sensitivity troponin 161>> 136>> 118.  EKG showed sinus rhythm 69 bpm, no acute ischemic changes.  MRI brain with no acute intracranial abnormality, mild amount of fluid in the left sphenoid sinus.  Cardiology called in the setting of patient having what was described as atypical chest pain early this morning which was brief in nature.  High-sensitivity troponins were  checked and noted above.  Repeat EKG showed T wave inversion in inferior leads.  It was reported to be chest pain-free at that time.  Per reports patient was more alert and oriented this morning into the afternoon.  Reportedly got up and walked to the bathroom and came back to the bed and had a possible episode of seizure-like activity.  In review of telemetry she has episodes of complete heart block with wide-complex beats leading into episode of VF.  She was given atropine. Active CPR in progress at the time of assessment.  Intubated in the ED.  Was given multiple rounds of epinephrine and bicarb and ROSC obtained.  Of note i-STAT labs obtained during CPR notable for potassium of 2.2, glucose 63, hemoglobin 7.1.   Patient was seen in conjunction with Dr. Jens Som in the ED.  Decision was made to bring up to the Cath Lab for emergent cardiac catheterization.  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   Qualifier: Diagnosis of  By: Benjamin Stain MD, Maisie Fus      Past  Surgical History:  Procedure Laterality Date   ABDOMINAL HYSTERECTOMY     BLADDER SUSPENSION  2009   CORONARY PRESSURE/FFR STUDY N/A 06/13/2022   Procedure: INTRAVASCULAR PRESSURE WIRE/FFR STUDY;  Surgeon: Lyn Records, MD;  Location: [facility];  Service: Cardiovascular;  Laterality: N/A;   DIAGNOSTIC LAPAROSCOPY     ectopic pregnancy   DILATION AND CURETTAGE OF UTERUS     hx mab   endoscopy nasal sinus surgery  01/13/2020   [facility] - CFS Leak Repair   LEFT HEART CATH AND CORONARY ANGIOGRAPHY N/A 06/13/2022   Procedure: LEFT HEART CATH AND CORONARY ANGIOGRAPHY;  Surgeon:  Lyn Records, MD;  Location: [facility];  Service: Cardiovascular;  Laterality: N/A;   SACROSPINOUS LIGAMENT FIXATION     posterior repair wtih cysto   SVD     x 2   TUBAL LIGATION  1999   VAGINAL HYSTERECTOMY  03/13/2012   Procedure: HYSTERECTOMY VAGINAL;  Surgeon: Catalina Antigua, MD;  Location: [facility];  Service: Gynecology;  Laterality: N/A;    Inpatient Medications: Scheduled Meds:  [MAR Hold] aspirin EC  81 mg Oral Daily   [MAR Hold] atorvastatin  80 mg Oral Daily   atropine       [MAR Hold] docusate  100 mg Per Tube BID   EPINEPHrine NaCl       etomidate       [MAR Hold] famotidine  20 mg Per Tube BID   fentaNYL       [MAR Hold] fentaNYL (SUBLIMAZE) injection  50 mcg Intravenous Once   midazolam       [MAR Hold] multivitamin with minerals  1 tablet Oral Daily   [MAR Hold] pantoprazole (PROTONIX) IV  40 mg Intravenous QHS   [MAR Hold] polyethylene glycol  17 g Oral Daily   [MAR Hold] polyethylene glycol  17 g Per Tube Daily   rocuronium       [MAR Hold] senna  1 tablet Oral Daily   succinylcholine       vasopressin       Continuous Infusions:  [MAR Hold] sodium chloride     sodium chloride 10 mL (12/06/23 1249)   fentaNYL infusion INTRAVENOUS 100 mcg/hr (12/06/23 1304)   heparin Stopped (12/06/23 1208)   midazolam 2 mg/hr (12/06/23 1304)   [MAR Hold] norepinephrine (LEVOPHED) Adult infusion     PRN Meds: Place/Maintain arterial line **AND** [MAR Hold] sodium chloride, [MAR Hold] acetaminophen, atropine, [MAR Hold] docusate sodium, EPINEPHrine NaCl, etomidate, fentaNYL, [MAR Hold] fentaNYL, [MAR Hold] fentaNYL (SUBLIMAZE) injection, [MAR Hold] fentaNYL (SUBLIMAZE) injection, midazolam, [MAR Hold] midazolam, [MAR Hold] midazolam, [MAR Hold] polyethylene glycol, [MAR Hold] prochlorperazine, rocuronium, succinylcholine, vasopressin  Allergies:    Allergies  Allergen Reactions   Latex Itching and Dermatitis   Norco [Hydrocodone-Acetaminophen] Other (See Comments)     CNS dysphoria   Percocet [Oxycodone-Acetaminophen] Other (See Comments)    CNS Dysphoria    Social History:   Social History   Socioeconomic History   Marital status: Divorced    Spouse name: Not on file   Number of children: Not on file   Years of education: Not on file   Highest education level: 12th grade  Occupational History   Not on file  Tobacco Use   Smoking status: Former    Current packs/day: 0.00    Average packs/day: 1 pack/day for 34.0 years (34.0 ttl pk-yrs)    Types: Cigarettes    Start date: 09/03/1981    Quit date:  09/04/2015    Years since quitting: 8.2   Smokeless tobacco: Never   Tobacco comments:    Started age 48 - quit age 37  Vaping Use   Vaping status: Former  Substance and Sexual Activity   Alcohol use: Yes    Alcohol/week: 2.0 standard drinks of alcohol    Types: 2 Standard drinks or equivalent per week    Comment: socially   Drug use: No   Sexual activity: Yes    Birth control/protection: Surgical    Comment: Hysterectomy  Other Topics Concern   Not on file  Social History Narrative   Lives at home with 2 dogs and 1 cat   Right handed   Social Drivers of Health   Financial Resource Strain: Medium Risk (01/10/2023)   Overall Financial Resource Strain (CARDIA)    Difficulty of Paying Living Expenses: Somewhat hard  Food Insecurity: Food Insecurity Present (01/10/2023)   Hunger Vital Sign    Worried About Running Out of Food in the Last Year: Sometimes true    Ran Out of Food in the Last Year: Sometimes true  Transportation Needs: No Transportation Needs (01/10/2023)   PRAPARE - Administrator, Civil Service (Medical): No    Lack of Transportation (Non-Medical): No  Physical Activity: Not on file  Stress: Not on file  Social Connections: Not on file  Intimate Partner Violence: Not on file    Family History:    Family History  Problem Relation Age of Onset   Other Mother        balance issues from a "thing in her  brain" requires walker   Colon cancer Mother 6       dx 2019   Cancer Father    Heart disease Maternal Grandmother    Diabetes Maternal Grandmother    Rectal cancer Neg Hx    Stomach cancer Neg Hx      ROS:  Please see the history of present illness.   All other ROS reviewed and negative.     Physical Exam/Data:   Vitals:   12/06/23 0635 12/06/23 0945 12/06/23 1000 12/06/23 1205  BP: 133/74  129/66 (!) 72/58  Pulse: (!) 52 62 62 (!) 31  Resp: 14 13 16    Temp:      TempSrc:      SpO2: 100% 100% 100% (!) 85%   No intake or output data in the 24 hours ending 12/06/23 1305    12/04/2023   12:49 PM 10/19/2023    3:04 PM 09/21/2023    3:10 PM  Last 3 Weights  Weight (lbs) 209 lb 211 lb 3.2 oz 210 lb  Weight (kg) 94.802 kg 95.8 kg 95.255 kg     There is no height or weight on file to calculate BMI.   Exam per MD  EKG:  The EKG was personally reviewed and demonstrates:  see above Telemetry:  Telemetry was personally reviewed and demonstrates: Sinus rhythm, leading into complete heart block with intermittent wide-complex beats then VFib  Relevant CV Studies:  Cath: 06/2022  CONCLUSIONS: Angiographically near normal coronary arteries with minimal luminal irregularity. Right dominant anatomy. Invasive diagnostic procedure: CFR 4.0 (normal greater than 2.0) and IMR 15 (normal less than 25).  Thus, no evidence of microvascular dysfunction.  Vasospastic angina was not ruled out as acetylcholine challenge was not performed.   RECOMMENDATIONS: Consider alternative explanations for chest discomfort.  No evidence of coronary microvascular disease or obstructive disease.    Laboratory Data:  High Sensitivity Troponin:   Recent Labs  Lab 12/05/23 2229 12-27-23 0130 27-Dec-2023 0402  TROPONINIHS 161* 136* 118*     Chemistry Recent Labs  Lab 12/05/23 1003 12/05/23 1140 12/27/2023 0402 27-Dec-2023 1212 December 27, 2023 1233  NA 138   < > 135 149* 148*  K 4.6   < > 3.5 2.2* 2.3*   CL 107  --  104 123*  --   CO2 22  --  19*  --   --   GLUCOSE 166*  --  118* 63*  --   BUN 14  --  12 6  --   CREATININE 0.87  --  0.74 0.50  --   CALCIUM 9.8  --  8.6*  --   --   GFRNONAA >60  --  >60  --   --   ANIONGAP 9  --  12  --   --    < > = values in this interval not displayed.    Recent Labs  Lab 12/05/23 1003 12-27-2023 0402  PROT 7.1 6.4*  ALBUMIN 3.8 3.4*  AST 77* 32  ALT 38 24  ALKPHOS 111 82  BILITOT 0.8 0.9   Lipids No results for input(s): "CHOL", "TRIG", "HDL", "LABVLDL", "LDLCALC", "CHOLHDL" in the last 168 hours.  Hematology Recent Labs  Lab 12/05/23 1003 12/05/23 1140 27-Dec-2023 0402 2023-12-27 1212 2023/12/27 1233  WBC 8.3  --  10.0  --   --   RBC 4.77  --  4.22  --   --   HGB 14.2   < > 12.5 7.1* 7.5*  HCT 43.3   < > 38.4 21.0* 22.0*  MCV 90.8  --  91.0  --   --   MCH 29.8  --  29.6  --   --   MCHC 32.8  --  32.6  --   --   RDW 13.7  --  13.9  --   --   PLT 261  --  218  --   --    < > = values in this interval not displayed.   Thyroid  Recent Labs  Lab 12/05/23 2229  TSH 2.035    BNPNo results for input(s): "BNP", "PROBNP" in the last 168 hours.  DDimer No results for input(s): "DDIMER" in the last 168 hours.   Radiology/Studies:  EEG adult Result Date: 2023/12/27 Deanna Quest, MD     12-27-23  8:24 AM Patient Name: Abbegayle Denault MRN: [id number] Epilepsy Attending: Charlsie Scott Referring Physician/Provider: Para March, DO Date: 27-Dec-2023 Duration: 22.10 mins Patient history:  53 y.o. female PMH migraines, HTN, chronic dizziness (unclear cause), and CSF leak with endoscopic repair in 2021, urinary incontinence s/p suburethral sling placement  presenting with altered mental status. EEG to evaluate for seizure Level of alertness: Awake, asleep AEDs during EEG study: None Technical aspects: This EEG study was done with scalp electrodes positioned according to the 10-20 International system of electrode placement. Electrical activity  was reviewed with band pass filter of 1-70Hz , sensitivity of 7 uV/mm, display speed of 62mm/sec with a 60Hz  notched filter applied as appropriate. EEG data were recorded continuously and digitally stored.  Video monitoring was available and reviewed as appropriate. Description: The posterior dominant rhythm consists of 9 Hz activity of moderate voltage (25-35 uV) seen predominantly in posterior head regions, symmetric and reactive to eye opening and eye closing. Sleep was characterized by vertex waves, sleep spindles (12 to 14 Hz), maximal frontocentral region. EEG showed intermittent generalized  polymorphic 3 to 6 Hz theta-delta slowing. Hyperventilation and photic stimulation were not performed.   ABNORMALITY - Intermittent slow, generalized IMPRESSION: This study is suggestive of mild diffuse encephalopathy. No seizures or epileptiform discharges were seen throughout the recording. Please note lack of epileptiform activity during interictal EEG does not exclude the diagnosis of epilepsy. Deanna Scott   MR BRAIN WO CONTRAST Result Date: 12/05/2023 CLINICAL DATA:  Neuro deficit, acute, stroke suspected. EXAM: MRI HEAD WITHOUT CONTRAST TECHNIQUE: Multiplanar, multiecho pulse sequences of the brain and surrounding structures were obtained without intravenous contrast. COMPARISON:  MRI of the brain February 12, 2021. FINDINGS: Brain: No acute infarction, hemorrhage, hydrocephalus, extra-axial collection or mass lesion. Vascular: Normal flow voids. Skull and upper cervical spine: No focal marrow lesion. Sinuses/Orbits: Small amount of fluid within the lateral recess of the left sphenoid sinus. The orbits are maintained. Other: None. IMPRESSION: 1. No acute intracranial abnormality. 2. Small amount of fluid in the left sphenoid sinus. Given prior history of CSF leak, clinical correlation is suggested for recurrent CSF leak versus inflammatory disease. Electronically Signed   By: Baldemar Lenis M.D.    On: 12/05/2023 16:37   CT HEAD WO CONTRAST Result Date: 12/05/2023 CLINICAL DATA:  Mental status change, persistent or worsening. EXAM: CT HEAD WITHOUT CONTRAST TECHNIQUE: Contiguous axial images were obtained from the base of the skull through the vertex without intravenous contrast. RADIATION DOSE REDUCTION: This exam was performed according to the departmental dose-optimization program which includes automated exposure control, adjustment of the mA and/or kV according to patient size and/or use of iterative reconstruction technique. COMPARISON:  Head MRI 02/12/2021 FINDINGS: Brain: There is no evidence of an acute infarct, intracranial hemorrhage, mass, midline shift, or extra-axial fluid collection. Cerebral volume is normal. The ventricles are normal in size. Vascular: No hyperdense vessel. Skull: No acute fracture. 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 Sinuses/Orbits: The included portions of the paranasal sinuses and mastoid air cells are clear. Unremarkable orbits. Other: None. IMPRESSION: No evidence of acute intracranial abnormality. Electronically Signed   By: Sebastian Ache M.D.   On: 12/05/2023 11:42   DG Chest 1 View Result Date: 12/05/2023 CLINICAL DATA:  Altered mental status. EXAM: CHEST  1 VIEW COMPARISON:  Chest radiograph dated June 06, 2022. FINDINGS: Low lung volumes. The heart size and mediastinal contours are within normal limits. No focal consolidation, pleural effusion, or pneumothorax. No acute osseous abnormality. IMPRESSION: Low lung volumes.  No acute cardiopulmonary findings. Electronically Signed   By: Hart Robinsons M.D.   On: 12/05/2023 10:58     Assessment and Plan:   Heiress Williamson is a 53 y.o. female with a hx of GERD, HLD, HTN, tobacco use who is being seen 12/06/2023 for the evaluation of chest pain/elevated  troponin at the request of Dr. Manson Passey.  Cardiac/Vfib arrest High degree AVB Acute respiratory failure -- Initially presented on 1/29 with episode of syncope, reported episode of atypical chest pain the morning of 1/30. -- Noted to have an site of seizure-like activity after coming back from the bathroom with high degree AV block and wide complex beats leading into ventricular fibrillation -- Resuscitated/intubated in the ED with ROSC obtained after several rounds of epinephrine and bicarb -- Decision made per MD to bring up to the Cath Lab for further evaluation with cardiac catheterization --  PCCM at bedside -- further management pending cardiac cath   UTI Hypertension Tobacco use GERD   Risk Assessment/Risk Scores:   TIMI Risk Score for Unstable Angina or Non-ST Elevation MI:   The patient's TIMI risk score is 1, which indicates a 5% risk of all cause mortality, new or recurrent myocardial infarction or need for urgent revascularization in the next 14 days.{   For questions or updates, please contact [facility] Please consult www.[email or web address] for contact info under    Signed, Laverda Page, NP  12/06/2023 1:05 PM As above, patient seen and examined.  Briefly she is a 53 year old female with past medical history of hypertension, hyperlipidemia, tobacco use, gastroesophageal reflux disease, migraine headaches for evaluation of chest pain following cardiac arrest.  Cardiac catheterization August 2023 showed minimal coronary disease.  History is gleaned from the chart and through discussions with nurses and physicians caring for the patient.  Patient apparently admitted with confusion and syncope last evening.  Per discussions with nurses she apparently had intermittent chest pain last evening.  Today prior to transferring to a hospital bed she developed complete heart block followed by ventricular fibrillation arrest.  At the time of my evaluation she has been resuscitated, is  intubated and unresponsive.  Head CT showed no evidence of bleed or other CT abnormality.  Initial laboratory showed sodium 136, potassium 4.6, creatinine 0.87, hemoglobin 14.2, ethyl alcohol less than 10, electrocardiogram showed sinus rhythm with no ST changes and normal QT interval.  Troponin is 161, 136 and 118.  Talk screen positive for marijuana.  I-STAT showed potassium 2.3, hemoglobin 7.5.  Follow-up ECG showed sinus rhythm with inferolateral T wave inversion.  Follow-up ECG showed complete heart block/left bundle branch block.  Review of monitor showed sinus rhythm followed by complete heart block and ventricular fibrillation.  Following resuscitation it was felt the patient would require emergent cardiac catheterization to exclude right coronary artery or LAD lesion.  Prior to the catheterization Dr. Jacinto Halim requested TEE to rule out dissection.  This was performed and showed ejection fraction 40 to 45% with septal akinesis, and no significant pericardial fusion, no dissection (upper normal to mildly dilated ascending aorta at 3.8 cm) and no aortic insufficiency.  1 s/p cardiac arrest-etiology of events unclear.  However her troponin is minimally elevated, electrocardiogram showed new inferior lateral T wave inversion and there is a wall motion noted on her transesophageal echocardiogram.  Given complete heart block followed by ventricular fibrillation arrest I feel that emergent cardiac catheterization is indicated to exclude right coronary artery lesion.  The risk and benefits including myocardial infarction, CVA and death were discussed with her daughter and she agreed to proceed.  2 hypokalemia-this was on an i-STAT following her cardiac arrest.  Previous potassium was normal.  Will recheck.  3 precipitous drop in hemoglobin-no evidence of bleeding.  This was also on an i-STAT.  Will recheck.  4 ventilator dependent respiratory failure-per critical care medicine.  Olga Millers, MD

## 2023-12-06 NOTE — Progress Notes (Signed)
Pharmacy Antibiotic Note  Deanna Scott is a 53 y.o. female admitted on 12/05/2023 now being treated for  aspiration pneumonia .   The patient first arrived to ED with CC of altered mental status possibly due to UTI. PMH migraines, chronic dizziness, and CSF leak with endoscopic repair in 2021. Around mid-day 1/30, patient developed chest pain with  DOE and. She had an episode of bradycardia and was given atropine, she required RSI for airway protection, and chest compressions were started after loss of pulse. Cardiology and CCM were both contacted at this time. After ROSC obtained, the patient was send to cath lab for emergent angiogram. Findings were negative for any coronary disease. Patient was transferred to ICU afterwards. Pharmacy has been consulted for Unasyn dosing.  Plan: Unasyn 3 grams every 6 hours Monitor micro results, fever curve, renal function, opportunities for de-escalation    Temp (24hrs), Avg:97.9 F (36.6 C), Min:97.8 F (36.6 C), Max:98.1 F (36.7 C)  Recent Labs  Lab 12/05/23 1003 12/05/23 1140 12/06/23 0402 12/06/23 1212  WBC 8.3  --  10.0  --   CREATININE 0.87  --  0.74 0.50  LATICACIDVEN  --  2.5*  --  1.1    Estimated Creatinine Clearance: 86.3 mL/min (by C-G formula based on SCr of 0.5 mg/dL).    Allergies  Allergen Reactions   Latex Itching and Dermatitis   Norco [Hydrocodone-Acetaminophen] Other (See Comments)    CNS dysphoria   Percocet [Oxycodone-Acetaminophen] Other (See Comments)    CNS Dysphoria    Antimicrobials this admission: Unasyn 1/30 >>   Dose adjustments this admission:   Microbiology results: 1/30 BCx:  1/30 UCx:   1/30 MRSA PCR:   Thank you for allowing pharmacy to be a part of this patient's care.  Wilmer Floor 12/06/2023 3:17 PM

## 2023-12-06 NOTE — ED Notes (Addendum)
Patient requested to go to bathroom, at this time A&Ox4, and denies dizzy, chest pain, and shortness of breath. Patient ambulated to bathroom with steady gait. Moments later patient daughter states "my mom is having chest pain again." Pt VSS, but this RN did note abnormal rhythm. Rhythm captured on monitor, EKG obtained, and provider notified. After a few minutes patient stated pain subsided.

## 2023-12-06 NOTE — Plan of Care (Addendum)
Notified by attending Dr. Manson Passey @ ~12:20 pm that patient had episode of Bradycardia in the ED and EDP at bedside had given atropine. Dr. Rexene Alberts and I went to bedside. I introduced myslef to nursing staff at foot of bed, and noticed EDP at head of bead. HR of <30 on the monitor. EDP called for chest compressions. Escorted family out of doorway per request of ED PA, and notified secretary to call chaplain. Assessed necessity of intervening, and EDP was running code and intubation was being done. Noted that team was attempting to place LUCAS, and remained outside of room to provide support to family and space for the CODE team. Spoke with PA during CODE and offered to call CCM if their expertise may be of use- Dr. Laroy Apple called PCCM and they came to evaluate. I was paged for admission and warm handoff between me and Dr. Laroy Apple and Dr. Rexene Alberts completed.   Addendum: Dr. Laroy Apple received the call at 11:58 pm from Dr. Manson Passey, and informed me of episode. The call from 12:20 was unrelated to this incident.

## 2023-12-06 NOTE — Progress Notes (Signed)
PT Cancellation Note  Patient Details Name: Deanna Scott MRN: [id number] DOB: 1971/09/15   Cancelled Treatment:    Reason Eval/Treat Not Completed: Patient not medically ready  Patient having chest pain and RN about to start a heparin drip.    Jerolyn Center, PT Acute Rehabilitation Services  Office [phone]  Zena Amos 12/06/2023, 9:19 AM

## 2023-12-06 NOTE — Assessment & Plan Note (Signed)
Urinalysis with positive nitrites, although 11-20 epithelial cells. Treated with Rocephin x1 dose in the ED. Daughters report pt has been complaining of some urinary frequency lately but no dysuria or hematuria. Pt denies urinary sx.  - Urine culture - Reassess need for antibiotics

## 2023-12-06 NOTE — Progress Notes (Signed)
PHARMACY - ANTICOAGULATION CONSULT NOTE  Pharmacy Consult for heparin  Indication: chest pain/ACS  Allergies  Allergen Reactions   Latex Itching and Dermatitis   Norco [Hydrocodone-Acetaminophen] Other (See Comments)    CNS dysphoria   Percocet [Oxycodone-Acetaminophen] Other (See Comments)    CNS Dysphoria    Patient Measurements:   Heparin Dosing Weight: 72.3 kg   Vital Signs: Temp: 97.8 F (36.6 C) (01/30 0615) Temp Source: Oral (01/30 0615) BP: 133/74 (01/30 0635) Pulse Rate: 52 (01/30 0635)  Labs: Recent Labs    12/05/23 1003 12/05/23 1140 12/05/23 2229 12/06/23 0130 12/06/23 0402  HGB 14.2 14.3  --   --  12.5  HCT 43.3 42.0  --   --  38.4  PLT 261  --   --   --  218  CREATININE 0.87  --   --   --  0.74  TROPONINIHS  --   --  161* 136* 118*    Estimated Creatinine Clearance: 86.3 mL/min (by C-G formula based on SCr of 0.74 mg/dL).   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 Pharmacy consulted to dose heparin for ACS. Hb 12.5, plt 218. No anticoagulation prior  to admission.   Goal of Therapy:  Heparin level 0.3-0.7 units/ml Monitor platelets by anticoagulation protocol: Yes   Plan:  Give 4000 units bolus x 1 Start heparin infusion at 850 units/hr Check anti-Xa level in 6 hours and daily while on heparin Continue to monitor H&H and platelets  Cedric Fishman 12/06/2023,8:29 AM

## 2023-12-07 ENCOUNTER — Encounter (HOSPITAL_COMMUNITY): Payer: Self-pay | Admitting: Cardiology

## 2023-12-07 ENCOUNTER — Inpatient Hospital Stay (HOSPITAL_COMMUNITY): Payer: No Typology Code available for payment source

## 2023-12-07 DIAGNOSIS — N179 Acute kidney failure, unspecified: Secondary | ICD-10-CM

## 2023-12-07 DIAGNOSIS — J9602 Acute respiratory failure with hypercapnia: Secondary | ICD-10-CM | POA: Diagnosis not present

## 2023-12-07 DIAGNOSIS — E1165 Type 2 diabetes mellitus with hyperglycemia: Secondary | ICD-10-CM

## 2023-12-07 DIAGNOSIS — E872 Acidosis, unspecified: Secondary | ICD-10-CM

## 2023-12-07 DIAGNOSIS — E876 Hypokalemia: Secondary | ICD-10-CM

## 2023-12-07 DIAGNOSIS — I469 Cardiac arrest, cause unspecified: Secondary | ICD-10-CM | POA: Diagnosis not present

## 2023-12-07 DIAGNOSIS — J9601 Acute respiratory failure with hypoxia: Secondary | ICD-10-CM | POA: Diagnosis not present

## 2023-12-07 LAB — CBC WITH DIFFERENTIAL/PLATELET
Abs Immature Granulocytes: 0.4 10*3/uL — ABNORMAL HIGH (ref 0.00–0.07)
Basophils Absolute: 0.1 10*3/uL (ref 0.0–0.1)
Basophils Relative: 0 %
Eosinophils Absolute: 0 10*3/uL (ref 0.0–0.5)
Eosinophils Relative: 0 %
HCT: 36.5 % (ref 36.0–46.0)
Hemoglobin: 12.3 g/dL (ref 12.0–15.0)
Immature Granulocytes: 1 %
Lymphocytes Relative: 8 %
Lymphs Abs: 3.2 10*3/uL (ref 0.7–4.0)
MCH: 30.3 pg (ref 26.0–34.0)
MCHC: 33.7 g/dL (ref 30.0–36.0)
MCV: 89.9 fL (ref 80.0–100.0)
Monocytes Absolute: 2.7 10*3/uL — ABNORMAL HIGH (ref 0.1–1.0)
Monocytes Relative: 7 %
Neutro Abs: 31.8 10*3/uL — ABNORMAL HIGH (ref 1.7–7.7)
Neutrophils Relative %: 84 %
Platelets: 203 10*3/uL (ref 150–400)
RBC: 4.06 MIL/uL (ref 3.87–5.11)
RDW: 14.1 % (ref 11.5–15.5)
Smear Review: NORMAL
WBC: 38.1 10*3/uL — ABNORMAL HIGH (ref 4.0–10.5)
nRBC: 0 % (ref 0.0–0.2)

## 2023-12-07 LAB — RESPIRATORY PANEL BY PCR

## 2023-12-07 LAB — POCT I-STAT 7, (LYTES, BLD GAS, ICA,H+H)
Acid-base deficit: 8 mmol/L — ABNORMAL HIGH (ref 0.0–2.0)
Bicarbonate: 18.6 mmol/L — ABNORMAL LOW (ref 20.0–28.0)
Calcium, Ion: 1.19 mmol/L (ref 1.15–1.40)
HCT: 37 % (ref 36.0–46.0)
Hemoglobin: 12.6 g/dL (ref 12.0–15.0)
O2 Saturation: 99 %
Patient temperature: 37.5
Potassium: 4 mmol/L (ref 3.5–5.1)
Sodium: 146 mmol/L — ABNORMAL HIGH (ref 135–145)
TCO2: 20 mmol/L — ABNORMAL LOW (ref 22–32)
pCO2 arterial: 40.7 mm[Hg] (ref 32–48)
pH, Arterial: 7.27 — ABNORMAL LOW (ref 7.35–7.45)
pO2, Arterial: 158 mm[Hg] — ABNORMAL HIGH (ref 83–108)

## 2023-12-07 LAB — CBC
HCT: 42.3 % (ref 36.0–46.0)
Hemoglobin: 13.7 g/dL (ref 12.0–15.0)
MCH: 30 pg (ref 26.0–34.0)
MCHC: 32.4 g/dL (ref 30.0–36.0)
MCV: 92.6 fL (ref 80.0–100.0)
Platelets: 263 10*3/uL (ref 150–400)
RBC: 4.57 MIL/uL (ref 3.87–5.11)
RDW: 14.1 % (ref 11.5–15.5)
WBC: 32.5 10*3/uL — ABNORMAL HIGH (ref 4.0–10.5)
nRBC: 0 % (ref 0.0–0.2)

## 2023-12-07 LAB — BASIC METABOLIC PANEL
Anion gap: 17 — ABNORMAL HIGH (ref 5–15)
Anion gap: 11 (ref 5–15)
Anion gap: 10 (ref 5–15)
Anion gap: 11 (ref 5–15)
BUN: 19 mg/dL (ref 6–20)
BUN: 20 mg/dL (ref 6–20)
BUN: 21 mg/dL — ABNORMAL HIGH (ref 6–20)
BUN: 23 mg/dL — ABNORMAL HIGH (ref 6–20)
CO2: 13 mmol/L — ABNORMAL LOW (ref 22–32)
CO2: 20 mmol/L — ABNORMAL LOW (ref 22–32)
CO2: 22 mmol/L (ref 22–32)
CO2: 21 mmol/L — ABNORMAL LOW (ref 22–32)
Calcium: 8 mg/dL — ABNORMAL LOW (ref 8.9–10.3)
Calcium: 8.3 mg/dL — ABNORMAL LOW (ref 8.9–10.3)
Calcium: 7.9 mg/dL — ABNORMAL LOW (ref 8.9–10.3)
Calcium: 7.9 mg/dL — ABNORMAL LOW (ref 8.9–10.3)
Chloride: 112 mmol/L — ABNORMAL HIGH (ref 98–111)
Chloride: 112 mmol/L — ABNORMAL HIGH (ref 98–111)
Chloride: 113 mmol/L — ABNORMAL HIGH (ref 98–111)
Chloride: 113 mmol/L — ABNORMAL HIGH (ref 98–111)
Creatinine, Ser: 1.41 mg/dL — ABNORMAL HIGH (ref 0.44–1.00)
Creatinine, Ser: 1.39 mg/dL — ABNORMAL HIGH (ref 0.44–1.00)
Creatinine, Ser: 1.25 mg/dL — ABNORMAL HIGH (ref 0.44–1.00)
Creatinine, Ser: 1.12 mg/dL — ABNORMAL HIGH (ref 0.44–1.00)
GFR, Estimated: 44 mL/min — ABNORMAL LOW (ref >60)
GFR, Estimated: 45 mL/min — ABNORMAL LOW (ref >60)
GFR, Estimated: 51 mL/min — ABNORMAL LOW (ref >60)
GFR, Estimated: 58 mL/min — ABNORMAL LOW (ref >60)
Glucose, Bld: 324 mg/dL — ABNORMAL HIGH (ref 70–99)
Glucose, Bld: 173 mg/dL — ABNORMAL HIGH (ref 70–99)
Glucose, Bld: 178 mg/dL — ABNORMAL HIGH (ref 70–99)
Glucose, Bld: 176 mg/dL — ABNORMAL HIGH (ref 70–99)
Potassium: 2.9 mmol/L — ABNORMAL LOW (ref 3.5–5.1)
Potassium: 3.8 mmol/L (ref 3.5–5.1)
Potassium: 4 mmol/L (ref 3.5–5.1)
Potassium: 4.2 mmol/L (ref 3.5–5.1)
Sodium: 142 mmol/L (ref 135–145)
Sodium: 143 mmol/L (ref 135–145)
Sodium: 145 mmol/L (ref 135–145)
Sodium: 145 mmol/L (ref 135–145)

## 2023-12-07 LAB — GLUCOSE, CAPILLARY
Glucose-Capillary: 148 mg/dL — ABNORMAL HIGH (ref 70–99)
Glucose-Capillary: 149 mg/dL — ABNORMAL HIGH (ref 70–99)
Glucose-Capillary: 159 mg/dL — ABNORMAL HIGH (ref 70–99)
Glucose-Capillary: 166 mg/dL — ABNORMAL HIGH (ref 70–99)
Glucose-Capillary: 166 mg/dL — ABNORMAL HIGH (ref 70–99)
Glucose-Capillary: 171 mg/dL — ABNORMAL HIGH (ref 70–99)
Glucose-Capillary: 172 mg/dL — ABNORMAL HIGH (ref 70–99)
Glucose-Capillary: 176 mg/dL — ABNORMAL HIGH (ref 70–99)
Glucose-Capillary: 184 mg/dL — ABNORMAL HIGH (ref 70–99)
Glucose-Capillary: 212 mg/dL — ABNORMAL HIGH (ref 70–99)
Glucose-Capillary: 254 mg/dL — ABNORMAL HIGH (ref 70–99)
Glucose-Capillary: 266 mg/dL — ABNORMAL HIGH (ref 70–99)
Glucose-Capillary: 315 mg/dL — ABNORMAL HIGH (ref 70–99)
Glucose-Capillary: 332 mg/dL — ABNORMAL HIGH (ref 70–99)
Glucose-Capillary: 333 mg/dL — ABNORMAL HIGH (ref 70–99)
Glucose-Capillary: 388 mg/dL — ABNORMAL HIGH (ref 70–99)

## 2023-12-07 LAB — CG4 I-STAT (LACTIC ACID)
Lactic Acid, Venous: 5.7 mmol/L (ref 0.5–1.9)
Lactic Acid, Venous: 4.4 mmol/L (ref 0.5–1.9)
Lactic Acid, Venous: 3 mmol/L (ref 0.5–1.9)
Lactic Acid, Venous: 2.8 mmol/L (ref 0.5–1.9)

## 2023-12-07 LAB — HEMOGLOBIN AND HEMATOCRIT, BLOOD
HCT: 39.4 % (ref 36.0–46.0)
Hemoglobin: 12.9 g/dL (ref 12.0–15.0)

## 2023-12-07 LAB — COOXEMETRY PANEL
Carboxyhemoglobin: 1.2 % (ref 0.5–1.5)
Carboxyhemoglobin: 1.2 % (ref 0.5–1.5)
Methemoglobin: <0.7 % (ref 0.0–1.5)
Methemoglobin: <0.7 % (ref 0.0–1.5)
O2 Saturation: 61 %
O2 Saturation: 65.5 %
Total hemoglobin: 14 g/dL (ref 12.0–16.0)
Total hemoglobin: 12.2 g/dL (ref 12.0–16.0)

## 2023-12-07 LAB — PHOSPHORUS
Phosphorus: 2.4 mg/dL — ABNORMAL LOW (ref 2.5–4.6)
Phosphorus: 2.5 mg/dL (ref 2.5–4.6)

## 2023-12-07 LAB — MAGNESIUM
Magnesium: 2.9 mg/dL — ABNORMAL HIGH (ref 1.7–2.4)
Magnesium: 2.2 mg/dL (ref 1.7–2.4)

## 2023-12-07 LAB — LIDOCAINE LEVEL: Lidocaine Lvl: 1.8 ug/mL (ref 1.5–5.0)

## 2023-12-07 MED ORDER — ORAL CARE MOUTH RINSE
15.0000 mL | OROMUCOSAL | Status: DC
  Administered 2023-12-07 – 2023-12-09 (×28): 15 mL via OROMUCOSAL

## 2023-12-07 MED ORDER — POTASSIUM CHLORIDE 10 MEQ/50ML IV SOLN
10.0000 meq | INTRAVENOUS | Status: AC
  Administered 2023-12-07 (×6): 10 meq via INTRAVENOUS
  Filled 2023-12-07 (×6): qty 50

## 2023-12-07 MED ORDER — FENTANYL BOLUS VIA INFUSION
50.0000 ug | INTRAVENOUS | Status: DC | PRN
Start: 2023-12-07 — End: 2023-12-10
  Administered 2023-12-07: 100 ug via INTRAVENOUS
  Administered 2023-12-07: 50 ug via INTRAVENOUS
  Administered 2023-12-08 (×2): 100 ug via INTRAVENOUS
  Administered 2023-12-08: 50 ug via INTRAVENOUS
  Administered 2023-12-09: 100 ug via INTRAVENOUS

## 2023-12-07 MED ORDER — SPIRONOLACTONE 25 MG PO TABS
25.0000 mg | ORAL_TABLET | Freq: Every day | ORAL | Status: DC
  Filled 2023-12-07: qty 1

## 2023-12-07 MED ORDER — POTASSIUM CHLORIDE 20 MEQ PO PACK
40.0000 meq | PACK | Freq: Once | ORAL | Status: DC
  Filled 2023-12-07: qty 2

## 2023-12-07 MED ORDER — VITAL AF 1.2 CAL PO LIQD: 1000.0000 mL | ORAL | Status: DC

## 2023-12-07 MED ORDER — LACTATED RINGERS IV SOLN: INTRAVENOUS | Status: AC

## 2023-12-07 MED ORDER — POTASSIUM CHLORIDE 20 MEQ PO PACK
40.0000 meq | PACK | Freq: Once | ORAL | Status: AC
  Administered 2023-12-07: 40 meq
  Filled 2023-12-07: qty 2

## 2023-12-07 MED ORDER — SODIUM BICARBONATE 8.4 % IV SOLN
50.0000 meq | Freq: Once | INTRAVENOUS | Status: AC
  Administered 2023-12-07: 50 meq via INTRAVENOUS
  Filled 2023-12-07: qty 50

## 2023-12-07 MED ORDER — MILRINONE LACTATE IN DEXTROSE 20-5 MG/100ML-% IV SOLN
0.2500 ug/kg/min | INTRAVENOUS | Status: DC
  Administered 2023-12-07 – 2023-12-08 (×3): 0.25 ug/kg/min via INTRAVENOUS
  Filled 2023-12-07: qty 100
  Filled 2023-12-07: qty 200
  Filled 2023-12-07: qty 100

## 2023-12-07 MED ORDER — POTASSIUM & SODIUM PHOSPHATES 280-160-250 MG PO PACK
1.0000 | PACK | Freq: Three times a day (TID) | ORAL | Status: AC
Start: 2023-12-07 — End: 2023-12-07
  Administered 2023-12-07 (×2): 1
  Filled 2023-12-07 (×3): qty 1

## 2023-12-07 MED ORDER — FENTANYL BOLUS VIA INFUSION
50.0000 ug | INTRAVENOUS | Status: DC | PRN
  Administered 2023-12-07: 50 ug via INTRAVENOUS

## 2023-12-07 MED ORDER — MIDAZOLAM BOLUS VIA INFUSION
1.0000 mg | INTRAVENOUS | Status: DC | PRN
  Administered 2023-12-07 – 2023-12-09 (×4): 1 mg via INTRAVENOUS

## 2023-12-07 MED ORDER — ACETAMINOPHEN 160 MG/5ML PO SOLN
650.0000 mg | ORAL | Status: DC | PRN
  Administered 2023-12-07 – 2023-12-09 (×3): 650 mg
  Filled 2023-12-07 (×3): qty 20.3

## 2023-12-07 MED ORDER — VITAL AF 1.2 CAL PO LIQD
1000.0000 mL | ORAL | Status: DC
  Administered 2023-12-07 – 2023-12-08 (×2): 1000 mL

## 2023-12-07 MED ORDER — PROSOURCE TF20 ENFIT COMPATIBL EN LIQD
60.0000 mL | Freq: Every day | ENTERAL | Status: DC
  Administered 2023-12-07 – 2023-12-08 (×2): 60 mL
  Filled 2023-12-07 (×2): qty 60

## 2023-12-07 MED ORDER — ORAL CARE MOUTH RINSE
15.0000 mL | OROMUCOSAL | Status: DC | PRN
Start: 2023-12-07 — End: 2023-12-09

## 2023-12-07 MED ORDER — FUROSEMIDE 10 MG/ML IJ SOLN: 40.0000 mg | Freq: Two times a day (BID) | INTRAMUSCULAR | Status: DC

## 2023-12-07 MED FILL — Fentanyl Citrate-NaCl 0.9% IV Soln 2.5 MG/250ML: INTRAVENOUS | Qty: 250 | Status: AC

## 2023-12-07 NOTE — Progress Notes (Addendum)
NAME:  Deanna Scott, MRN:  [id number], DOB:  Apr 06, 1971, LOS: 1 ADMISSION DATE:  12/05/2023, CONSULTATION DATE:  12/06/23 REFERRING MD:  FPTS , CHIEF COMPLAINT:  CPR in progress    History of Present Illness:   53 yo F PMH HTN, HLD, CSF leak sp repair, tobacco use, migraines who was admitted to [facility] 1/29 after presenting with reported AMS, though as additional information has been obtained this sounds like it was related to a syncopal event. Admitting though possible c/w sz, so an MRI brain was obtained which was unrevealing  On 1/29 night into 1/30 the pt was having intermittent chest pain and DOE. She had a marginally elevated hs trop 1/29 evening at 161, remained in the 100s into 1/30. An abnormal rhythm was noted by ED nursing 1/30 around 0700. . Later started on hep gtt . Multiple EKGs obtained.  Cardiology was consulted.   Shortly after, patient c/o chest pain, became bradycardic and arrested. PCCM consulted while compressions were in progress to weigh in on if TPA / tnkase should be considered.   Both PCCM and cardiology arrived to bedside essentially at the same time.  ROSC was achieved on our arrival. There is not a great timeline of arrest duration, but sounds like 15 min - 20 min. Did not get RSI meds during peri-arrest intubation.   Is having some movements following ROSC.    EKGs reviewed 631- abnormal T waves, abnormal R wave progression   801- repolarization abnormality c/f anterolateral ischemia  1202- brady, RBBB concern for acute lateral MI   Tele review w some episodes of CHB, maybe episode of VF   Sounds like she arrested shortly after 1202 EKG  Istat labs w K 2.2 iCal 0.6 hgb 7  CBG 66, got 2 amp   Pertinent  Medical History  HTN HLD Repaired CSF leak   Significant Hospital Events: Including procedures, antibiotic start and stop dates in addition to other pertinent events   1/29 syncope chest pain OOH. AdmitFPTS to obv. MRI neg. Progressive chest pain 1/29-30  night. Mild trop bump 1/30 ongoing CP. EKG changes. Bradycardic and arrested. Cards consulted right before arrest. PCCM consulted during code.  Cqath with clean cors. Later in day torsades. Return to CL for TVP  1/31 ongoing lyte replacement. Adding milrinone    Interim History / Subjective:   Weaning pressors  Objective   Blood pressure (!) 121/46, pulse (!) 108, temperature 99.5 F (37.5 C), resp. rate 18, weight 101 kg, last menstrual period 03/02/2012, SpO2 99%. PAP: (22-34)/(16-25) 23/18 CVP:  [10 mmHg-19 mmHg] 10 mmHg CO:  [2.4 L/min-3.5 L/min] 3.5 L/min CI:  [1.3 L/min/m2-1.79 L/min/m2] 1.79 L/min/m2  Vent Mode: PRVC FiO2 (%):  [60 %-100 %] 60 % Set Rate:  [22 bmp-32 bmp] 24 bmp Vt Set:  [400 mL-440 mL] 440 mL PEEP:  [8 cmH20-12 cmH20] 10 cmH20 Plateau Pressure:  [19 cmH20-24 cmH20] 24 cmH20   Intake/Output Summary (Last 24 hours) at 12/07/2023 1106 Last data filed at 12/07/2023 1000 Gross per 24 hour  Intake 3204.32 ml  Output 1125 ml  Net 2079.32 ml   Filed Weights   12/07/23 0500  Weight: 101 kg    Examination: General: critically ill middle aged F intubated sedated  HENT: NCAT ETT secure. Swan  Lungs: Mechanically ventilated, symmetrical chest expansion   Cardiovascular: rr s1s2.  Abdomen: Obese soft  Extremities: RLE IO. LUE edematous and dusky. 1+ L radial pulse Neuro: Sedated. Opens eyes spontaneously and reaches for tube  GU: foley yellow urine   Resolved Hospital Problem list     Assessment & Plan:   Cardiac arrests -- recurrent VF -clean cors following 1st arrest 1/30. Return to [facility] 1/30 after VF/Torsades arrest for TVP.  -LVEF has been fairly preserved.  Does have septal wall motion abnormalities.  -on 1/31 RV looks a bit down  -catalyst here is still unclear. Had some lyte abnormalities but not sure if that would fully explain. Query add'l pathology -- something infectious vs autoimmune / inflammatory.  Crp mildly elevated (2.9) and sed rate is  normal. Vasospasm also a consideration P -Adv HF Following -weaning pressors ( NE, epi, vaso) -adding milrinone -cont lido  -TVP in place for now -avoid fevers  -on solumedrol  -needs CMR when able  -weaning sedation for RASS -1 / -2. On versed and fent. Avoid precedex  -dc IO   Acute respiratory failure with hypercarbia and hypoxia Aspiration event  P -weaning vent settings (dropped PEEP to 10 Rate to 24) 1/31 - repeat ABG at 10, hopefully further wean PEEP  -VAP, pulm hygiene   Hypokalemia Hypomagnesemia Hypocalcemia P -aggressive replacement -repeat BMP this morning, and serially   AKI Metabolic acidosis P  -giving an amp of bicarb based on last ABG.  -repeat BMP later this morning as above -cont foley and strict I/O -- talked w nursing about changing our for a temp foley at some point   Lactic acidosis, improving  P -trend    DM2 w steroid induced hyperglycemia P -insulin gtt, transition off when able  Leukocytosis -reactive, steroid induced, ?infectious P -follow -on broad abx   IV extravasation -rcvd phentolamine  -serial neurovasc checks   Possible UTI P -reflex cx pending -is on broad abx as above   Best Practice (right click and "Reselect all SmartList Selections" daily)   Diet/type: NPO -- start EN DVT prophylaxis prophylactic heparin  Pressure ulcer(s): pressure ulcer assessment deferred  GI prophylaxis: PPI Lines: Central line, Arterial Line, and yes and it is still needed Foley:  Yes, and it is still needed Code Status:  full code Last date of multidisciplinary goals of care discussion [family updated 1/30 ]  Labs   CBC: Recent Labs  Lab 12/05/23 1003 12/05/23 1140 12/06/23 0402 12/06/23 1212 12/06/23 1525 12/06/23 1758 12/06/23 1850 12/06/23 2003 12/06/23 2202 12/06/23 2217 12/07/23 0306 12/07/23 1014  WBC 8.3  --  10.0  --  24.9*  --  45.0*  --   --   --  32.5*  --   NEUTROABS 6.0  --   --   --   --   --   --   --    --   --   --   --   HGB 14.2   < > 12.5   < > 14.4   < > 13.2 12.9 12.6 13.5 13.7 12.6  HCT 43.3   < > 38.4   < > 44.4   < > 40.5 38.0 37.0 41.2 42.3 37.0  MCV 90.8  --  91.0  --  92.5  --  92.3  --   --   --  92.6  --   PLT 261  --  218  --  352  --  315  --   --   --  263  --    < > = values in this interval not displayed.    Basic Metabolic Panel: Recent Labs  Lab 12/06/23 0402 12/06/23 1207 12/06/23 1212  12/06/23 1525 12/06/23 1734 12/06/23 1758 12/06/23 1829 12/06/23 1841 12/06/23 2003 12/06/23 2004 12/06/23 2202 12/07/23 0306 12/07/23 1014  NA 135  --    < > 135 143   < > 144   < > 142 141 144 142 146*  K 3.5  --    < > 4.4 2.9*   < > 2.8*   < > 4.1 4.0 3.7 2.9* 4.0  CL 104  --    < > 101 107  --  106  --   --  108  --  112*  --   CO2 19*  --   --  21* 21*  --   --   --   --  18*  --  13*  --   GLUCOSE 118*  --    < > 411* 411*  --  381*  --   --  465*  --  324*  --   BUN 12  --    < > 14 14  --  15  --   --  16  --  19  --   CREATININE 0.74  --    < > 1.14* 1.22*  --  1.10*  --   --  1.24*  --  1.41*  --   CALCIUM 8.6*  --   --  9.4 8.7*  --   --   --   --  8.1*  --  8.0*  --   MG  --  1.0*  --   --  4.8*  --   --   --   --  4.0*  --  2.9*  --    < > = values in this interval not displayed.   GFR: Estimated Creatinine Clearance: 50.8 mL/min (A) (by C-G formula based on SCr of 1.41 mg/dL (H)). Recent Labs  Lab 12/06/23 0402 12/06/23 1212 12/06/23 1525 12/06/23 1829 12/06/23 1850 12/06/23 2017 12/07/23 0306 12/07/23 0601 12/07/23 0850 12/07/23 1019  WBC 10.0  --  24.9*  --  45.0*  --  32.5*  --   --   --   LATICACIDVEN  --    < > 3.7*   < >  --  7.5*  --  5.7* 4.4* 3.0*   < > = values in this interval not displayed.    Liver Function Tests: Recent Labs  Lab 12/05/23 1003 12/06/23 0402 12/06/23 1734  AST 77* 32 348*  ALT 38 24 149*  ALKPHOS 111 82 89  BILITOT 0.8 0.9 0.9  PROT 7.1 6.4* 5.2*  ALBUMIN 3.8 3.4* 2.6*   No results for input(s):  "LIPASE", "AMYLASE" in the last 168 hours. Recent Labs  Lab 12/05/23 1217  AMMONIA 19    ABG    Component Value Date/Time   PHART 7.270 (L) 12/07/2023 1014   PCO2ART 40.7 12/07/2023 1014   PO2ART 158 (H) 12/07/2023 1014   HCO3 18.6 (L) 12/07/2023 1014   TCO2 20 (L) 12/07/2023 1014   ACIDBASEDEF 8.0 (H) 12/07/2023 1014   O2SAT 99 12/07/2023 1014     Coagulation Profile: Recent Labs  Lab 12/06/23 1734  INR 1.4*    Cardiac Enzymes: No results for input(s): "CKTOTAL", "CKMB", "CKMBINDEX", "TROPONINI" in the last 168 hours.  HbA1C: HbA1c, POC (prediabetic range)  Date/Time Value Ref Range Status  09/21/2023 03:50 PM 6.2 5.7 - 6.4 % Final  06/08/2022 03:07 PM 6.1 5.7 - 6.4 % Final   Hgb A1c MFr Bld  Date/Time Value  Ref Range Status  10/19/2023 03:46 PM 6.6 (H) 4.8 - 5.6 % Final    Comment:             Prediabetes: 5.7 - 6.4          Diabetes: >6.4          Glycemic control for adults with diabetes: <7.0     CBG: Recent Labs  Lab 12/07/23 0502 12/07/23 0558 12/07/23 0657 12/07/23 0848 12/07/23 1017  GLUCAP 254* 212* 184* 171* 166*      CRITICAL CARE Performed by: Lanier Clam   Total critical care time: 48 minutes  Critical care time was exclusive of separately billable procedures and treating other patients. Critical care was necessary to treat or prevent imminent or life-threatening deterioration.  Critical care was time spent personally by me on the following activities: development of treatment plan with patient and/or surrogate as well as nursing, discussions with consultants, evaluation of patient's response to treatment, examination of patient, obtaining history from patient or surrogate, ordering and performing treatments and interventions, ordering and review of laboratory studies, ordering and review of radiographic studies, pulse oximetry and re-evaluation of patient's condition.  Tessie Fass MSN, AGACNP-BC [facility] Pulmonary/Critical Care  Medicine Amion for pager  12/07/2023, 11:06 AM

## 2023-12-07 NOTE — Consult Note (Signed)
ELECTROPHYSIOLOGY CONSULT NOTE    Patient ID: Deanna Scott MRN: [id number], DOB/AGE: Jun 30, 1971 53 y.o.  Admit date: 12/05/2023 Date of Consult: 12/07/2023  Primary Physician: Darral Dash, DO Primary Cardiologist: Chilton Si, MD  Electrophysiologist: Dr. Jimmey Ralph   Referring Provider: Dr. Merrily Pew   Patient Profile: Deanna Scott is a 53 y.o. female with a history of HTN, HLD, CSF leak s/p repair, tobacco use, THC abuse who is being seen today for the evaluation of VT at the request of Dr. Gala Romney.  HPI:  Deanna Scott is a 53 y.o. female who presented to [facility] ER on 12/05/23 after a syncopal episode with loss of bowel and bladder.   On presentation, she reported palpitations and dizziness.  EMS reported she was in AF but no prior hx of AF.  She reportedly converted to NSR en route to the the ER.   Evening of 1/29 into 1/30, she was having intermittent chest pain and shortness of breath. She was reportedly talking on the phone with her daughter and she heard the phone drop. She was initially admitted to the [facility] with what was thought to be a possible seizure.  MRI brain was negative.  Troponin was marginally elevated at 161 and remained into the 100's into 1/30.  ER RN noted an abnormal heart rhythm and the patient was started on heparin gtt.  Multiple EKG's were obtained > progression showed progressive T wave inversion.  Shehad profound hypokalemia and hypomagnesemia. On the afternoon of 1/30, she was more alert. Walked to the restroom. After getting back to bed she another episode of seizure like-activity.  She had an episode of CHB then subsequent episodes of VT/VF. She required multiple rounds of ACLS before ROSC.  She was intubated and admitted to ICU requiring vasopressors.   EP consulted for evaluation of arrhythmia.    Labs Potassium4.0 (01/31 1228) Magnesium  2.9* (01/31 0306) Creatinine, ser  1.25* (01/31 1228) PLT  263 (01/31 0306) HGB  12.9 (01/31 1016) WBC  32.5* (01/31 0306) Troponin I (High Sensitivity)1,666* (01/30 2005).    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   Qualifier: Diagnosis of  By: Benjamin Stain MD, Maisie Fus       Surgical History:  Past Surgical History:  Procedure Laterality Date   ABDOMINAL HYSTERECTOMY     BLADDER SUSPENSION  2009   CORONARY PRESSURE/FFR STUDY N/A 06/13/2022   Procedure: INTRAVASCULAR PRESSURE WIRE/FFR STUDY;  Surgeon: Lyn Records, MD;  Location: [facility] INVASIVE CV LAB;  Service: Cardiovascular;  Laterality: N/A;   DIAGNOSTIC LAPAROSCOPY     ectopic pregnancy   DILATION AND CURETTAGE OF UTERUS     hx mab   endoscopy nasal sinus surgery  01/13/2020   [facility] - CFS Leak Repair   LEFT HEART CATH AND CORONARY ANGIOGRAPHY N/A 06/13/2022   Procedure: LEFT HEART CATH AND CORONARY ANGIOGRAPHY;  Surgeon: Lyn Records, MD;  Location: [facility] INVASIVE CV LAB;  Service: Cardiovascular;  Laterality: N/A;   LEFT HEART CATH AND CORONARY ANGIOGRAPHY N/A 12/06/2023   Procedure: LEFT HEART  CATH AND CORONARY ANGIOGRAPHY;  Surgeon: Yates Decamp, MD;  Location: [facility] INVASIVE CV LAB;  Service: Cardiovascular;  Laterality: N/A;   SACROSPINOUS LIGAMENT FIXATION     posterior repair wtih cysto   SVD     x 2   TRANSESOPHAGEAL ECHOCARDIOGRAM (CATH LAB) N/A 12/06/2023   Procedure: TRANSESOPHAGEAL ECHOCARDIOGRAM;  Surgeon: Lewayne Bunting, MD;  Location: [facility] INVASIVE CV LAB;  Service: Cardiovascular;  Laterality: N/A;   TUBAL LIGATION  1999   VAGINAL HYSTERECTOMY  03/13/2012   Procedure: HYSTERECTOMY VAGINAL;  Surgeon: Catalina Antigua, MD;  Location: [facility] ORS;  Service: Gynecology;  Laterality:  N/A;     Facility-Administered Medications Prior to Admission  Medication Dose Route Frequency Provider Last Rate Last Admin   sodium chloride flush (NS) 0.9 % injection 3 mL  3 mL Intravenous Q12H Chilton Si, MD       Medications Prior to Admission  Medication Sig Dispense Refill Last Dose/Taking   fluticasone (FLONASE) 50 MCG/ACT nasal spray Place 1 spray into both nostrils daily. (Patient taking differently: Place 1 spray into both nostrils daily as needed for allergies.) 16 g 0 Past Month   isosorbide mononitrate (IMDUR) 60 MG 24 hr tablet Take 1 tablet (60 mg total) by mouth daily. 90 tablet 0 12/04/2023 Morning   omeprazole (PRILOSEC OTC) 20 MG tablet Take 20 mg by mouth daily as needed (heartburn, acid reflux).   Past Week   pseudoephedrine (SUDAFED) 30 MG tablet Take 30-60 mg by mouth every 4 (four) hours as needed for congestion.   Past Week   rosuvastatin (CRESTOR) 10 MG tablet Take 1 tablet (10 mg total) by mouth 3 (three) times a week. (Patient not taking: Reported on 12/05/2023) 39 tablet 3 Not Taking    Inpatient Medications:   atorvastatin  80 mg Per Tube Daily   Chlorhexidine Gluconate Cloth  6 each Topical Daily   docusate  100 mg Per Tube BID   feeding supplement (PROSource TF20)  60 mL Per Tube Daily   heparin injection (subcutaneous)  5,000 Units Subcutaneous Q8H   ipratropium-albuterol  3 mL Nebulization Q4H   multivitamin with minerals  1 tablet Per Tube Daily   nitroGLYCERIN  1 inch Topical Q8H   mouth rinse  15 mL Mouth Rinse Q2H   pantoprazole (PROTONIX) IV  40 mg Intravenous Q12H   polyethylene glycol  17 g Per Tube Daily   potassium & sodium phosphates  1 packet Per Tube TID WC & HS   senna  1 tablet Per Tube Daily   sodium chloride flush  3 mL Intravenous Q12H    Allergies:  Allergies  Allergen Reactions   Latex Itching and Dermatitis   Norco [Hydrocodone-Acetaminophen] Other (See Comments)    CNS dysphoria   Percocet [Oxycodone-Acetaminophen]  Other (See Comments)    CNS Dysphoria    Family History  Problem Relation Age of Onset   Other Mother        balance issues from a "thing in her brain" requires walker   Colon cancer Mother 98       dx 2019   Cancer Father    Heart disease Maternal Grandmother    Diabetes Maternal Grandmother    Rectal cancer Neg Hx    Stomach cancer Neg Hx      Physical Exam: Vitals:   12/07/23 1530 12/07/23 1545 12/07/23 1600 12/07/23 1615  BP:      Pulse: (!) 108 (!) 102 (!) 102 (!) 110  Resp: 19 (!) 24 (!)  24 18  Temp: 99.7 F (37.6 C) 99.7 F (37.6 C) 99.9 F (37.7 C) 99.9 F (37.7 C)  TempSrc:      SpO2: 95% 92% 94% 96%  Weight:      Height:        GEN- critically ill appearing adult female lying in bed in NAD, awakens to voice, attempts to mouth words HEENT: Normocephalic, atraumatic, R internal jugular temp pacer  Lungs- CTAB, Normal effort.  Heart- Regular rate and rhythm, No M/G/R.  GI- Soft, NT, ND.  Extremities- No clubbing, cyanosis. Generalized 1+ edema   Radiology/Studies: CARDIAC CATHETERIZATION Result Date: 12/07/2023 HEMODYNAMICS: RA:       22 mmHg (mean) RV:       42/20 mmHg PA:       42/30 mmHg (36 mean) PCWP: 22 mmHg (mean)    Thermodilution CO/CI   3.7L/min, 1.9L/min/m2       PVR  3.78 Wood Units PAPi  0.5  IMPRESSION: Successful placement of right radial arterial line and right internal jugular temporary pacing wire. Right heart catheterization with swan placement. Biventricular elevated filling pressures. Severely reduced cardiac index Moderate group II PH Procedure accomplished while patient on ventilator with PEEP of 10 RECOMMENDATIONS: Continued post arrest care   Port CXR Result Date: 12/07/2023 CLINICAL DATA:  [id number] Encounter for central line placement [id number] EXAM: PORTABLE CHEST 1 VIEW COMPARISON:  Chest radiograph from one day prior. FINDINGS: Endotracheal tube tip is 4.7 cm above the carina. Enteric tube terminates in the proximal stomach. Left internal  jugular central venous catheter terminates over the right ventricle. Right internal jugular Swan-Ganz catheter terminates over the right pulmonary artery. Stable cardiomediastinal silhouette with normal heart size. No pneumothorax. No pleural effusion. Previously visualized patchy parahilar hazy opacities have largely resolved. IMPRESSION: 1. Support structures as detailed.  No pneumothorax. 2. Nearly resolved parahilar edema. Electronically Signed   By: Delbert Phenix M.D.   On: 12/07/2023 10:20   DG CHEST PORT 1 VIEW Result Date: 12/06/2023 CLINICAL DATA:  Hypoxia, status post code. EXAM: PORTABLE CHEST 1 VIEW COMPARISON:  Earlier today FINDINGS: Stable positioning of endotracheal and enteric tubes. There is a new right internal jugular Swan-Ganz catheter with tip overlying the region of the main pulmonary outflow track. No pneumothorax. The previous lucency seen medial over the right hemithorax is no longer seen. Worsening interstitial thickening in the perihilar regions suspicious for pulmonary edema. There may be developing left pleural effusion. The heart is normal in size. IMPRESSION: 1. New right internal jugular Swan-Ganz catheter with tip overlying the region of the main pulmonary outflow track. No pneumothorax. 2. Worsening interstitial thickening in the perihilar regions suspicious for pulmonary edema. Possible developing left pleural effusion. 3. Stable positioning of endotracheal and enteric tubes. Electronically Signed   By: Narda Rutherford M.D.   On: 12/06/2023 21:57   DG CHEST PORT 1 VIEW Result Date: 12/06/2023 CLINICAL DATA:  Endotracheally intubated. EXAM: PORTABLE CHEST 1 VIEW COMPARISON:  3 hours ago. FINDINGS: The endotracheal tube has been retracted, carina is difficult to define on the current exam, however the tube appears to be appropriately positioned. Endotracheal tube tip below the diaphragm. Increasing background interstitial and peribronchial thickening, upper lobe predominant.  There is a lucency just to the right of midline there is felt to be artifactual, rather than medial pneumothorax. No significant pleural effusion. Right costophrenic angle is excluded from the field of view. IMPRESSION: 1. Endotracheal tube has been retracted, carina is difficult to define on the current  exam, however the tube appears to be appropriately positioned. 2. Increasing background interstitial and peribronchial thickening, upper lobe predominant, suspicious for pulmonary edema. 3. Lucency just to the right of midline is felt to be artifactual, rather than medial pneumothorax. Recommend attention at follow-up. Electronically Signed   By: Narda Rutherford M.D.   On: 12/06/2023 18:11   ECHOCARDIOGRAM COMPLETE Result Date: 12/06/2023    ECHOCARDIOGRAM REPORT   Patient Name:   Deanna Scott Date of Exam: 12/06/2023 Medical Rec #:  [id number]      Height:       62.0 in Accession #:    [id number]     Weight:       209.0 lb Date of Birth:  [birth year]      BSA:          1.948 m Patient Age:    52 years       BP:           135/88 mmHg Patient Gender: F              HR:           127 bpm. Exam Location:  Inpatient Procedure: 2D Echo, Color Doppler and Cardiac Doppler Indications:    Cardiac Arrest  History:        Patient has no prior history of Echocardiogram examinations.  Sonographer:    Harriette Bouillon RDCS Referring Phys: [phone] CARINA M BROWN IMPRESSIONS  1. Very poor image quality with poor endocardial border definition and off axis images. . Left ventricular ejection fraction, by estimation, is grossly estimated at 45 to 50%. The left ventricle has mildly decreased function. The left ventricle has multiple wall motion abnormalities. Left ventricular diastolic function could not be evaluated. There is akinesis of the left ventricular, entire anterior wall, apical septal wall and mid anteroseptal and inferoseptal walls.  2. Right ventricular systolic function is normal. The right ventricular size is normal.  There is normal pulmonary artery systolic pressure. The estimated right ventricular systolic pressure is 25.3 mmHg.  3. The mitral valve is normal in structure. No evidence of mitral valve regurgitation. No evidence of mitral stenosis.  4. The aortic valve is normal in structure. Aortic valve regurgitation is not visualized. No aortic stenosis is present.  5. The inferior vena cava is dilated in size with >50% respiratory variability, suggesting right atrial pressure of 8 mmHg.  6. Recommend repeat limited echo with definity to further assess EF and wall motion. FINDINGS  Left Ventricle: Very poor image quality with poor endocardial border definition and off axis images. Left ventricular ejection fraction, by gross estimation, is 45 to 50%. The left ventricle has mildly decreased function. The left ventricle demonstrates  regional wall motion abnormalities. The left ventricular internal cavity size was normal in size. There is no left ventricular hypertrophy. Left ventricular diastolic function could not be evaluated. Right Ventricle: The right ventricular size is normal. No increase in right ventricular wall thickness. Right ventricular systolic function is normal. There is normal pulmonary artery systolic pressure. The tricuspid regurgitant velocity is 2.08 m/s, and  with an assumed right atrial pressure of 8 mmHg, the estimated right ventricular systolic pressure is 25.3 mmHg. Left Atrium: Left atrial size was normal in size. Right Atrium: Right atrial size was normal in size. Pericardium: Trivial pericardial effusion is present. The pericardial effusion is anterior to the right ventricle and surrounding the apex. Mitral Valve: The mitral valve is normal in structure. No evidence of mitral valve regurgitation.  No evidence of mitral valve stenosis. Tricuspid Valve: The tricuspid valve is normal in structure. Tricuspid valve regurgitation is trivial. No evidence of tricuspid stenosis. Aortic Valve: The aortic valve  is normal in structure. Aortic valve regurgitation is not visualized. No aortic stenosis is present. Pulmonic Valve: The pulmonic valve was normal in structure. Pulmonic valve regurgitation is not visualized. No evidence of pulmonic stenosis. Aorta: The aortic root is normal in size and structure. Venous: The inferior vena cava is dilated in size with greater than 50% respiratory variability, suggesting right atrial pressure of 8 mmHg. IAS/Shunts: No atrial level shunt detected by color flow Doppler.  LEFT VENTRICLE PLAX 2D LVIDd:         3.70 cm LVIDs:         2.90 cm LV PW:         0.90 cm LV IVS:        1.00 cm LVOT diam:     2.20 cm LVOT Area:     3.80 cm  IVC IVC diam: 2.40 cm LEFT ATRIUM         Index LA diam:    3.10 cm 1.59 cm/m   AORTA Ao Root diam: 3.50 cm TRICUSPID VALVE TR Peak grad:   17.3 mmHg TR Vmax:        208.00 cm/s  SHUNTS Systemic Diam: 2.20 cm Armanda Magic MD Electronically signed by Armanda Magic MD Signature Date/Time: 12/06/2023/5:22:27 PM    Final    DG Chest Portable 1 View Result Date: 12/06/2023 CLINICAL DATA:  Endotracheal tube placement. EXAM: PORTABLE CHEST 1 VIEW COMPARISON:  Same day. FINDINGS: Endotracheal tube is directed into the right mainstem bronchus. Withdrawal by 6 cm is recommended. Nasogastric tube tip is seen entering stomach. IMPRESSION: Endotracheal tube is directed into the right mainstem bronchus. Withdrawal is recommended. Critical Value/emergent results were called by telephone at the time of interpretation on 12/06/2023 at 3:35 pm to provider Delorise Shiner, nurse practitioner in critical care unit, who verbally acknowledged these results. Electronically Signed   By: Lupita Raider M.D.   On: 12/06/2023 15:35   DG Abd 1 View Result Date: 12/06/2023 CLINICAL DATA:  Orogastric tube placement. EXAM: ABDOMEN - 1 VIEW COMPARISON:  None Available. FINDINGS: The bowel gas pattern is normal. Distal tip of the nasogastric tube is seen in the proximal stomach. No radio-opaque  calculi or other significant radiographic abnormality are seen. IMPRESSION: Distal tip of nasogastric tube seen in distal stomach. Electronically Signed   By: Lupita Raider M.D.   On: 12/06/2023 15:33   ECHO TEE Result Date: 12/06/2023    TRANSESOPHOGEAL ECHO REPORT   Patient Name:   Deanna Scott Date of Exam: 12/06/2023 Medical Rec #:  [id number]      Height:       62.0 in Accession #:    [id number]     Weight:       209.0 lb Date of Birth:  September 21, 1971      BSA:          1.948 m Patient Age:    52 years       BP:           72/58 mmHg Patient Gender: F              HR:           130 bpm. Exam Location:  Inpatient Procedure: Transesophageal Echo, Color Doppler and Cardiac Doppler Indications:     I50.9* Heart failure (unspecified)  History:         Patient has no prior history of Echocardiogram examinations.                  Arrythmias:Cardiac Arrest; Risk Factors:Hypertension and                  Dyslipidemia.  Sonographer:     Irving Burton Senior RDCS Referring Phys:  [id number] Yates Decamp Diagnosing Phys: Olga Millers MD  Sonographer Comments: Emergent on cath table PROCEDURE: After discussion of the risks and benefits of a TEE, an informed consent was obtained from the patient. The transesophogeal probe was passed without difficulty through the esophogus of the patient. Sedation performed by different physician. The patient was monitored while under deep sedation. The patient developed no complications during the procedure.  IMPRESSIONS  1. Procedure done emergently in cath lab to R/O dissection; no dissection noted; EF 40-45 with septal akinesis.  2. Left ventricular ejection fraction, by estimation, is 40 to 45%. The left ventricle has mildly decreased function. The left ventricle demonstrates regional wall motion abnormalities (see scoring diagram/findings for description).  3. Right ventricular systolic function is normal. The right ventricular size is normal.  4. The mitral valve is normal in structure. Trivial  mitral valve regurgitation.  5. The aortic valve is tricuspid. Aortic valve regurgitation is not visualized.  6. Aortic dilatation noted. There is borderline dilatation of the ascending aorta, measuring 38 mm. FINDINGS  Left Ventricle: Left ventricular ejection fraction, by estimation, is 40 to 45%. The left ventricle has mildly decreased function. The left ventricle demonstrates regional wall motion abnormalities. The left ventricular internal cavity size was normal in size. Right Ventricle: The right ventricular size is normal. Right ventricular systolic function is normal. Left Atrium: Left atrial size was normal in size. Right Atrium: Right atrial size was normal in size. Pericardium: There is no evidence of pericardial effusion. Mitral Valve: The mitral valve is normal in structure. Trivial mitral valve regurgitation. Tricuspid Valve: The tricuspid valve is normal in structure. Tricuspid valve regurgitation is trivial. Aortic Valve: The aortic valve is tricuspid. Aortic valve regurgitation is not visualized. Pulmonic Valve: The pulmonic valve was normal in structure. Aorta: Aortic dilatation noted. There is borderline dilatation of the ascending aorta, measuring 38 mm. IAS/Shunts: No atrial level shunt detected by color flow Doppler. Additional Comments: Procedure done emergently in cath lab to R/O dissection; no dissection noted; EF 40-45 with septal akinesis. Spectral Doppler performed. Olga Millers MD Electronically signed by Olga Millers MD Signature Date/Time: 12/06/2023/2:19:07 PM    Final    CARDIAC CATHETERIZATION Result Date: 12/06/2023 Left Heart Catheterization 12/06/23: Hemodynamic data: LVEDP 6 mmHg, no pressure gradient across the aortic valve. Angiographic data: LV: Mild decrease in LVEF, inferoseptal hypokinesis.  No significant mitral regurgitation. RCA: Large-caliber vessel, smooth and normal. LAD: Gives origin to a very large diagonal 1 and several small diagonals.  It is smooth and  normal. LCx: Large-caliber vessel, continues his OM1 and OM 2 after giving origin to a small AV groove branch.  Smooth and normal. Impression and recommendations: Findings consistent with nonischemic cardiomyopathy with very mild inferoseptal hypokinesis with normal EDP, EF 45% approximately.   EEG adult Result Date: 12/06/2023 Charlsie Quest, MD     12/06/2023  8:24 AM Patient Name: Deanna Scott MRN: [id number] Epilepsy Attending: Charlsie Quest Referring Physician/Provider: Para March, DO Date: 12/06/2023 Duration: 22.10 mins Patient history:  53 y.o. female PMH migraines, HTN, chronic dizziness (unclear cause), and CSF leak  with endoscopic repair in 2021, urinary incontinence s/p suburethral sling placement  presenting with altered mental status. EEG to evaluate for seizure Level of alertness: Awake, asleep AEDs during EEG study: None Technical aspects: This EEG study was done with scalp electrodes positioned according to the 10-20 International system of electrode placement. Electrical activity was reviewed with band pass filter of 1-70Hz , sensitivity of 7 uV/mm, display speed of 55mm/sec with a 60Hz  notched filter applied as appropriate. EEG data were recorded continuously and digitally stored.  Video monitoring was available and reviewed as appropriate. Description: The posterior dominant rhythm consists of 9 Hz activity of moderate voltage (25-35 uV) seen predominantly in posterior head regions, symmetric and reactive to eye opening and eye closing. Sleep was characterized by vertex waves, sleep spindles (12 to 14 Hz), maximal frontocentral region. EEG showed intermittent generalized polymorphic 3 to 6 Hz theta-delta slowing. Hyperventilation and photic stimulation were not performed.   ABNORMALITY - Intermittent slow, generalized IMPRESSION: This study is suggestive of mild diffuse encephalopathy. No seizures or epileptiform discharges were seen throughout the recording. Please note lack of  epileptiform activity during interictal EEG does not exclude the diagnosis of epilepsy. Charlsie Quest   MR BRAIN WO CONTRAST Result Date: 12/05/2023 CLINICAL DATA:  Neuro deficit, acute, stroke suspected. EXAM: MRI HEAD WITHOUT CONTRAST TECHNIQUE: Multiplanar, multiecho pulse sequences of the brain and surrounding structures were obtained without intravenous contrast. COMPARISON:  MRI of the brain February 12, 2021. FINDINGS: Brain: No acute infarction, hemorrhage, hydrocephalus, extra-axial collection or mass lesion. Vascular: Normal flow voids. Skull and upper cervical spine: No focal marrow lesion. Sinuses/Orbits: Small amount of fluid within the lateral recess of the left sphenoid sinus. The orbits are maintained. Other: None. IMPRESSION: 1. No acute intracranial abnormality. 2. Small amount of fluid in the left sphenoid sinus. Given prior history of CSF leak, clinical correlation is suggested for recurrent CSF leak versus inflammatory disease. Electronically Signed   By: Baldemar Lenis M.D.   On: 12/05/2023 16:37   CT HEAD WO CONTRAST Result Date: 12/05/2023 CLINICAL DATA:  Mental status change, persistent or worsening. EXAM: CT HEAD WITHOUT CONTRAST TECHNIQUE: Contiguous axial images were obtained from the base of the skull through the vertex without intravenous contrast. RADIATION DOSE REDUCTION: This exam was performed according to the departmental dose-optimization program which includes automated exposure control, adjustment of the mA and/or kV according to patient size and/or use of iterative reconstruction technique. COMPARISON:  Head MRI 02/12/2021 FINDINGS: Brain: There is no evidence of an acute infarct, intracranial hemorrhage, mass, midline shift, or extra-axial fluid collection. Cerebral volume is normal. The ventricles are normal in size. Vascular: No hyperdense vessel. Skull: No acute fracture. 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 Sinuses/Orbits: The included portions of the paranasal sinuses and mastoid air cells are clear. Unremarkable orbits. Other: None. IMPRESSION: No evidence of acute intracranial abnormality. Electronically Signed   By: Sebastian Ache M.D.   On: 12/05/2023 11:42   DG Chest 1 View Result Date: 12/05/2023 CLINICAL DATA:  Altered mental status. EXAM: CHEST  1 VIEW COMPARISON:  Chest radiograph dated June 06, 2022. FINDINGS: Low lung volumes. The heart size and mediastinal contours are within normal limits. No focal consolidation, pleural effusion, or pneumothorax. No acute osseous abnormality. IMPRESSION: Low lung volumes.  No acute cardiopulmonary findings. Electronically Signed  By: Hart Robinsons M.D.   On: 12/05/2023 10:58    EKG: (personally reviewed) 12/05/23 > NSR  12/06/23 0342 NSR with ST changes  12/06/23 0631 NSR with ST changes  12/06/23 1202 SB with wide QRS, ST changes, short PR and borderline short QT 12/07/23 NSR   TELEMETRY: ST 90-100's (personally reviewed)  DEVICE HISTORY:   Assessment/Plan:  VT/VF Cardiac Arrest   Tele review of event shows gradual slowing from SR with PAC's > she develops PVC's, then multifocal PVC's, NSVT, then R on T, polymorphic VT, then VFlutter, episodes of termination with sinus beats, further CPR with shocks.  She had profound electrolyte disturbances on admission.   No further VT since electrolytes replaced. JLVEF 40-45% on ECHO. LHC with NICM.   -monitor electrolytes closely, goal K+>4, Mg+>2 -continue temp pacing wire, adjusted to VVI 60 -continue lidocaine infusion -if patient continues to not use temp pacing wire, ok for it to be removed  -plan for cMRI once pacing wire removed  -avoid amio with torsades / ventricular arrhythmia     Hypokalemia Hypomagnesemia Hypocalcemia  -as above  Acute Systolic CHF with RV Failure   Cardiogenic Shock  -per advanced HF team  -vasopressors per AHF Team / PCCM    For questions or updates, please contact [facility] Please consult [email or web address] for contact info under Cardiology/STEMI.  Signed, Canary Brim, NP-C, AGACNP-BC [facility] - Electrophysiology  12/07/2023, 4:36 PM

## 2023-12-07 NOTE — Significant Event (Signed)
Overnight, Patient was intermittently pacing at the lower rate limit (VVI 80) with abrupt decrease in MAPs to below 50. Her intrinsic rhythm was intermittently captured at 83-85bpm with normalization of her blood pressures. I decreased her lower rate limit to 70bpm. Output remains at 10mA (threshold approximately 5mA). She no longer requires pacing and she has an intrinsic rhythm of 77bpm with SBP >90. Post ECG demonstrates sinus rhythm at 77bpm with Qtc .  Current settings: VVI 70  Sondra Barges, MD Cardiology

## 2023-12-07 NOTE — Progress Notes (Signed)
PT Cancellation Note  Patient Details Name: Deanna Scott MRN: [id number] DOB: Apr 24, 1971   Cancelled Treatment:    Reason Eval/Treat Not Completed: Patient not medically ready (Pt currently with SWAN-GANZ and sedated. Will sign off at this time. Please re-consult when pt is medically ready to participate in skilled physical therapy services)  Harrel Carina, DPT, CLT  Acute Rehabilitation Services Office: [phone] (Secure chat preferred)   Claudia Desanctis 12/07/2023, 1:18 PM

## 2023-12-07 NOTE — Progress Notes (Signed)
Chaplain responded to a consult to visit Pt and offer some prayers, per Pt's request. Chaplain briefly tried to provide some encouragement for Pt, who remains unavailable to communicate, due to sedation. Pt heard Chaplain letting her know her family and the team are with her, supporting her. After a short prayer, Chaplain had to let Pt rest. Nurse in the room kindly introduced Chaplain with the family, who were in the waiting area. Chaplain, family and friends were able to pray together and Chaplain offered a few minutes of compassionate presence for them. They shared about their exhaustion for the time at the hospital, but that they remain hopeful for Pt's recovery. We all pray together. Pt's family and friends were very grateful for Chaplain's visit.    12/07/23 1604  Spiritual Encounters  Type of Visit Initial  Care provided to: Patient;Family  Conversation partners present during encounter Nurse  Referral source Family;Clinical staff  Reason for visit Routine spiritual support  OnCall Visit No  Spiritual Framework  Presenting Themes Meaning/purpose/sources of inspiration;Values and beliefs;Rituals and practive;Coping tools  Community/Connection Family;Friend(s)  Patient Stress Factors Exhausted  Family Stress Factors Exhausted    Oneida Alar Chaplain Resident

## 2023-12-07 NOTE — Progress Notes (Signed)
Initial Nutrition Assessment  DOCUMENTATION CODES:   Not applicable  INTERVENTION:   Tube Feeding via OG:  Vital AF 1.2 at 60 ml/hr Begin TF at rate of 20 ml/hr, titrate by 10 mL q 8 hours until goal rate of 60 ml/hr TF provides 1728 kcals, 108 g of protein and 1166 mL of free water   NUTRITION DIAGNOSIS:   Inadequate oral intake related to acute illness as evidenced by NPO status.   GOAL:   Patient will meet greater than or equal to 90% of their needs  MONITOR:   Vent status, Labs, Weight trends, TF tolerance  REASON FOR ASSESSMENT:   Ventilator    ASSESSMENT:   53 yo female admitted with 24 hours of confusion, green diarrhea vomiting and dyspnea with chest pain on arrival to ED. Pt later developed bradycardia and had VF arrest, Intubated. PMH includes HTN, HLD, CSF leak s/p repair, tobacco use, migraines  1/29 Admitted 1/30 Cardiac Arrest, Intubated, ECHO EF 40-45%  Pt remains on vent support. Etiology of arrest unclear, myocarditis vs sarcoid, plan for cMRI when able Levophed at 17, vasopressin 0.04, Epinephrine 2  OG tube with tip and side port in stomach  Lactic Acid trending down  Current wt 101 kg; no wt loss trend per wt encounters. Unable to obtain diet and weight history from patient at this time  Labs: potassium 2.9 (L), sodium 146 (H), BUN wdl, Creatinine 1.41, CBGs 266-388 (goal 140-180) Meds: insulin drip, colace , lasix, KCL   Diet Order:   Diet Order             Diet NPO time specified  Diet effective now                   EDUCATION NEEDS:   Not appropriate for education at this time  Skin:  Skin Assessment: Reviewed RN Assessment  Last BM:  PTA  Height:   Ht Readings from Last 1 Encounters:  12/07/23 5\' 2"  (1.575 m)    Weight:   Wt Readings from Last 1 Encounters:  12/07/23 101 kg    BMI:  Body mass index is 40.73 kg/m.  Estimated Nutritional Needs:   Kcal:  1550-1750 kcals  Protein:  100-125 g  Fluid:  >/=  1.5 L   Romelle Starcher MS, RDN, LDN, CNSC Registered Dietitian 3 Clinical Nutrition RD Inpatient Contact Info in Amion

## 2023-12-07 NOTE — Progress Notes (Signed)
   12/07/23 1100  Spiritual Encounters  Type of Visit Follow up  Care provided to: [facility] partners present during encounter Nurse   Chaplain visited the patient's family as follow up and had a brief conversation about what had happened yesterday. Chaplain provided water.  If there is need more assistance, chaplain will continue to follow.  M.Kubra Delano Metz Resident [phone]

## 2023-12-07 NOTE — Plan of Care (Signed)
  Problem: Clinical Measurements: Goal: Will remain free from infection Outcome: Progressing Goal: Respiratory complications will improve Outcome: Progressing   Problem: Elimination: Goal: Will not experience complications related to urinary retention Outcome: Progressing   Problem: Pain Managment: Goal: General experience of comfort will improve and/or be controlled Outcome: Progressing   Problem: Cardiovascular: Goal: Ability to achieve and maintain adequate cardiovascular perfusion will improve Outcome: Progressing Goal: Vascular access site(s) Level 0-1 will be maintained Outcome: Progressing

## 2023-12-07 NOTE — Progress Notes (Addendum)
Advanced Heart Failure Rounding Note  Cardiologist: Chilton Si, MD   Chief Complaint: VF arrest  Subjective:     Currently on 0.04 Vaso + 2 Epi + 19 NE.   Swan #s: CVP 17 PA29/23(25) CO 2.44 CI 1.25  Lactic acid trending down, peaked at 7.5>>4.4 around 9 am.  Remains intubated, FiO2  60%, PEEP 10.  Opening eyes and some movements post resuscitation.    Objective:   Weight Range: 101 kg Body mass index is 40.73 kg/m.   Vital Signs:   Temp:  [97.7 F (36.5 C)-100.2 F (37.9 C)] 99.5 F (37.5 C) (01/31 1045) Pulse Rate:  [0-136] 108 (01/31 1045) Resp:  [0-65] 18 (01/31 1045) BP: (58-146)/(20-111) 121/46 (01/31 1000) SpO2:  [65 %-100 %] 99 % (01/31 1045) Arterial Line BP: (60-280)/(49-275) 142/78 (01/31 1045) FiO2 (%):  [40 %-100 %] 40 % (01/31 1129) Weight:  [101 kg] 101 kg (01/31 0500) Last BM Date :  (PTA)  Weight change: Filed Weights   12/07/23 0500  Weight: 101 kg    Intake/Output:   Intake/Output Summary (Last 24 hours) at 12/07/2023 1243 Last data filed at 12/07/2023 1200 Gross per 24 hour  Intake 3204.32 ml  Output 1675 ml  Net 1529.32 ml      Physical Exam    General: Critically ill appearing. HEENT: + ETT Neck: R internal jugular temp pacer, L internal jugular Swan Cor: Regular rate & rhythm. No rubs, gallops or murmurs. Lungs: Mechanical breath sounds Abdomen: Obese, soft, nondistended Extremities: LUE swelling from IV infiltrate. RLE I/O. 1+ edema. Neuro: Sedated on vent. Opens eyes.   Telemetry   SR. No recurrent ventricular arrhythmias overnight.  Labs    CBC Recent Labs    12/05/23 1003 12/05/23 1140 12/06/23 1850 12/06/23 2003 12/07/23 0306 12/07/23 1014 12/07/23 1016  WBC 8.3   < > 45.0*  --  32.5*  --   --   NEUTROABS 6.0  --   --   --   --   --   --   HGB 14.2   < > 13.2   < > 13.7 12.6 12.9  HCT 43.3   < > 40.5   < > 42.3 37.0 39.4  MCV 90.8   < > 92.3  --  92.6  --   --   PLT 261   < > 315  --   263  --   --    < > = values in this interval not displayed.   Basic Metabolic Panel Recent Labs    46/96/29 2004 12/06/23 2202 12/07/23 0306 12/07/23 0948 12/07/23 1014  NA 141   < > 142 143 146*  K 4.0   < > 2.9* 3.8 4.0  CL 108  --  112* 112*  --   CO2 18*  --  13* 20*  --   GLUCOSE 465*  --  324* 173*  --   BUN 16  --  19 20  --   CREATININE 1.24*  --  1.41* 1.39*  --   CALCIUM 8.1*  --  8.0* 8.3*  --   MG 4.0*  --  2.9*  --   --    < > = values in this interval not displayed.   Liver Function Tests Recent Labs    12/06/23 0402 12/06/23 1734  AST 32 348*  ALT 24 149*  ALKPHOS 82 89  BILITOT 0.9 0.9  PROT 6.4* 5.2*  ALBUMIN 3.4* 2.6*  No results for input(s): "LIPASE", "AMYLASE" in the last 72 hours. Cardiac Enzymes No results for input(s): "CKTOTAL", "CKMB", "CKMBINDEX", "TROPONINI" in the last 72 hours.  BNP: BNP (last 3 results) No results for input(s): "BNP" in the last 8760 hours.  ProBNP (last 3 results) No results for input(s): "PROBNP" in the last 8760 hours.   D-Dimer No results for input(s): "DDIMER" in the last 72 hours. Hemoglobin A1C No results for input(s): "HGBA1C" in the last 72 hours. Fasting Lipid Panel No results for input(s): "CHOL", "HDL", "LDLCALC", "TRIG", "CHOLHDL", "LDLDIRECT" in the last 72 hours. Thyroid Function Tests Recent Labs    12/05/23 2229  TSH 2.035    Other results:   Imaging    Port CXR Result Date: 12/07/2023 CLINICAL DATA:  [id number] Encounter for central line placement [id number] EXAM: PORTABLE CHEST 1 VIEW COMPARISON:  Chest radiograph from one day prior. FINDINGS: Endotracheal tube tip is 4.7 cm above the carina. Enteric tube terminates in the proximal stomach. Left internal jugular central venous catheter terminates over the right ventricle. Right internal jugular Swan-Ganz catheter terminates over the right pulmonary artery. Stable cardiomediastinal silhouette with normal heart size. No pneumothorax. No  pleural effusion. Previously visualized patchy parahilar hazy opacities have largely resolved. IMPRESSION: 1. Support structures as detailed.  No pneumothorax. 2. Nearly resolved parahilar edema. Electronically Signed   By: Delbert Phenix M.D.   On: 12/07/2023 10:20   DG CHEST PORT 1 VIEW Result Date: 12/06/2023 CLINICAL DATA:  Hypoxia, status post code. EXAM: PORTABLE CHEST 1 VIEW COMPARISON:  Earlier today FINDINGS: Stable positioning of endotracheal and enteric tubes. There is a new right internal jugular Swan-Ganz catheter with tip overlying the region of the main pulmonary outflow track. No pneumothorax. The previous lucency seen medial over the right hemithorax is no longer seen. Worsening interstitial thickening in the perihilar regions suspicious for pulmonary edema. There may be developing left pleural effusion. The heart is normal in size. IMPRESSION: 1. New right internal jugular Swan-Ganz catheter with tip overlying the region of the main pulmonary outflow track. No pneumothorax. 2. Worsening interstitial thickening in the perihilar regions suspicious for pulmonary edema. Possible developing left pleural effusion. 3. Stable positioning of endotracheal and enteric tubes. Electronically Signed   By: Narda Rutherford M.D.   On: 12/06/2023 21:57   DG CHEST PORT 1 VIEW Result Date: 12/06/2023 CLINICAL DATA:  Endotracheally intubated. EXAM: PORTABLE CHEST 1 VIEW COMPARISON:  3 hours ago. FINDINGS: The endotracheal tube has been retracted, carina is difficult to define on the current exam, however the tube appears to be appropriately positioned. Endotracheal tube tip below the diaphragm. Increasing background interstitial and peribronchial thickening, upper lobe predominant. There is a lucency just to the right of midline there is felt to be artifactual, rather than medial pneumothorax. No significant pleural effusion. Right costophrenic angle is excluded from the field of view. IMPRESSION: 1. Endotracheal  tube has been retracted, carina is difficult to define on the current exam, however the tube appears to be appropriately positioned. 2. Increasing background interstitial and peribronchial thickening, upper lobe predominant, suspicious for pulmonary edema. 3. Lucency just to the right of midline is felt to be artifactual, rather than medial pneumothorax. Recommend attention at follow-up. Electronically Signed   By: Narda Rutherford M.D.   On: 12/06/2023 18:11   ECHOCARDIOGRAM COMPLETE Result Date: 12/06/2023    ECHOCARDIOGRAM REPORT   Patient Name:   TYLICIA SHERMAN Date of Exam: 12/06/2023 Medical Rec #:  [id number]  Height:       62.0 in Accession #:    [id number]     Weight:       209.0 lb Date of Birth:  03/28/1971      BSA:          1.948 m Patient Age:    52 years       BP:           135/88 mmHg Patient Gender: F              HR:           127 bpm. Exam Location:  Inpatient Procedure: 2D Echo, Color Doppler and Cardiac Doppler Indications:    Cardiac Arrest  History:        Patient has no prior history of Echocardiogram examinations.  Sonographer:    Harriette Bouillon RDCS Referring Phys: [phone] CARINA M BROWN IMPRESSIONS  1. Very poor image quality with poor endocardial border definition and off axis images. . Left ventricular ejection fraction, by estimation, is grossly estimated at 45 to 50%. The left ventricle has mildly decreased function. The left ventricle has multiple wall motion abnormalities. Left ventricular diastolic function could not be evaluated. There is akinesis of the left ventricular, entire anterior wall, apical septal wall and mid anteroseptal and inferoseptal walls.  2. Right ventricular systolic function is normal. The right ventricular size is normal. There is normal pulmonary artery systolic pressure. The estimated right ventricular systolic pressure is 25.3 mmHg.  3. The mitral valve is normal in structure. No evidence of mitral valve regurgitation. No evidence of mitral stenosis.   4. The aortic valve is normal in structure. Aortic valve regurgitation is not visualized. No aortic stenosis is present.  5. The inferior vena cava is dilated in size with >50% respiratory variability, suggesting right atrial pressure of 8 mmHg.  6. Recommend repeat limited echo with definity to further assess EF and wall motion. FINDINGS  Left Ventricle: Very poor image quality with poor endocardial border definition and off axis images. Left ventricular ejection fraction, by gross estimation, is 45 to 50%. The left ventricle has mildly decreased function. The left ventricle demonstrates  regional wall motion abnormalities. The left ventricular internal cavity size was normal in size. There is no left ventricular hypertrophy. Left ventricular diastolic function could not be evaluated. Right Ventricle: The right ventricular size is normal. No increase in right ventricular wall thickness. Right ventricular systolic function is normal. There is normal pulmonary artery systolic pressure. The tricuspid regurgitant velocity is 2.08 m/s, and  with an assumed right atrial pressure of 8 mmHg, the estimated right ventricular systolic pressure is 25.3 mmHg. Left Atrium: Left atrial size was normal in size. Right Atrium: Right atrial size was normal in size. Pericardium: Trivial pericardial effusion is present. The pericardial effusion is anterior to the right ventricle and surrounding the apex. Mitral Valve: The mitral valve is normal in structure. No evidence of mitral valve regurgitation. No evidence of mitral valve stenosis. Tricuspid Valve: The tricuspid valve is normal in structure. Tricuspid valve regurgitation is trivial. No evidence of tricuspid stenosis. Aortic Valve: The aortic valve is normal in structure. Aortic valve regurgitation is not visualized. No aortic stenosis is present. Pulmonic Valve: The pulmonic valve was normal in structure. Pulmonic valve regurgitation is not visualized. No evidence of pulmonic  stenosis. Aorta: The aortic root is normal in size and structure. Venous: The inferior vena cava is dilated in size with greater than 50% respiratory variability, suggesting  right atrial pressure of 8 mmHg. IAS/Shunts: No atrial level shunt detected by color flow Doppler.  LEFT VENTRICLE PLAX 2D LVIDd:         3.70 cm LVIDs:         2.90 cm LV PW:         0.90 cm LV IVS:        1.00 cm LVOT diam:     2.20 cm LVOT Area:     3.80 cm  IVC IVC diam: 2.40 cm LEFT ATRIUM         Index LA diam:    3.10 cm 1.59 cm/m   AORTA Ao Root diam: 3.50 cm TRICUSPID VALVE TR Peak grad:   17.3 mmHg TR Vmax:        208.00 cm/s  SHUNTS Systemic Diam: 2.20 cm Armanda Magic MD Electronically signed by Armanda Magic MD Signature Date/Time: 12/06/2023/5:22:27 PM    Final    DG Chest Portable 1 View Result Date: 12/06/2023 CLINICAL DATA:  Endotracheal tube placement. EXAM: PORTABLE CHEST 1 VIEW COMPARISON:  Same day. FINDINGS: Endotracheal tube is directed into the right mainstem bronchus. Withdrawal by 6 cm is recommended. Nasogastric tube tip is seen entering stomach. IMPRESSION: Endotracheal tube is directed into the right mainstem bronchus. Withdrawal is recommended. Critical Value/emergent results were called by telephone at the time of interpretation on 12/06/2023 at 3:35 pm to provider Delorise Shiner, nurse practitioner in critical care unit, who verbally acknowledged these results. Electronically Signed   By: Lupita Raider M.D.   On: 12/06/2023 15:35   DG Abd 1 View Result Date: 12/06/2023 CLINICAL DATA:  Orogastric tube placement. EXAM: ABDOMEN - 1 VIEW COMPARISON:  None Available. FINDINGS: The bowel gas pattern is normal. Distal tip of the nasogastric tube is seen in the proximal stomach. No radio-opaque calculi or other significant radiographic abnormality are seen. IMPRESSION: Distal tip of nasogastric tube seen in distal stomach. Electronically Signed   By: Lupita Raider M.D.   On: 12/06/2023 15:33   ECHO TEE Result Date:  12/06/2023    TRANSESOPHOGEAL ECHO REPORT   Patient Name:   Deanna Scott Date of Exam: 12/06/2023 Medical Rec #:  [id number]      Height:       62.0 in Accession #:    [id number]     Weight:       209.0 lb Date of Birth:  04-Jun-1971      BSA:          1.948 m Patient Age:    52 years       BP:           72/58 mmHg Patient Gender: F              HR:           130 bpm. Exam Location:  Inpatient Procedure: Transesophageal Echo, Color Doppler and Cardiac Doppler Indications:     I50.9* Heart failure (unspecified)  History:         Patient has no prior history of Echocardiogram examinations.                  Arrythmias:Cardiac Arrest; Risk Factors:Hypertension and                  Dyslipidemia.  Sonographer:     Irving Burton Senior RDCS Referring Phys:  [id number] Yates Decamp Diagnosing Phys: Olga Millers MD  Sonographer Comments: Emergent on cath table PROCEDURE: After discussion of the risks and benefits  of a TEE, an informed consent was obtained from the patient. The transesophogeal probe was passed without difficulty through the esophogus of the patient. Sedation performed by different physician. The patient was monitored while under deep sedation. The patient developed no complications during the procedure.  IMPRESSIONS  1. Procedure done emergently in cath lab to R/O dissection; no dissection noted; EF 40-45 with septal akinesis.  2. Left ventricular ejection fraction, by estimation, is 40 to 45%. The left ventricle has mildly decreased function. The left ventricle demonstrates regional wall motion abnormalities (see scoring diagram/findings for description).  3. Right ventricular systolic function is normal. The right ventricular size is normal.  4. The mitral valve is normal in structure. Trivial mitral valve regurgitation.  5. The aortic valve is tricuspid. Aortic valve regurgitation is not visualized.  6. Aortic dilatation noted. There is borderline dilatation of the ascending aorta, measuring 38 mm. FINDINGS  Left  Ventricle: Left ventricular ejection fraction, by estimation, is 40 to 45%. The left ventricle has mildly decreased function. The left ventricle demonstrates regional wall motion abnormalities. The left ventricular internal cavity size was normal in size. Right Ventricle: The right ventricular size is normal. Right ventricular systolic function is normal. Left Atrium: Left atrial size was normal in size. Right Atrium: Right atrial size was normal in size. Pericardium: There is no evidence of pericardial effusion. Mitral Valve: The mitral valve is normal in structure. Trivial mitral valve regurgitation. Tricuspid Valve: The tricuspid valve is normal in structure. Tricuspid valve regurgitation is trivial. Aortic Valve: The aortic valve is tricuspid. Aortic valve regurgitation is not visualized. Pulmonic Valve: The pulmonic valve was normal in structure. Aorta: Aortic dilatation noted. There is borderline dilatation of the ascending aorta, measuring 38 mm. IAS/Shunts: No atrial level shunt detected by color flow Doppler. Additional Comments: Procedure done emergently in cath lab to R/O dissection; no dissection noted; EF 40-45 with septal akinesis. Spectral Doppler performed. Olga Millers MD Electronically signed by Olga Millers MD Signature Date/Time: 12/06/2023/2:19:07 PM    Final    CARDIAC CATHETERIZATION Result Date: 12/06/2023 Left Heart Catheterization 12/06/23: Hemodynamic data: LVEDP 6 mmHg, no pressure gradient across the aortic valve. Angiographic data: LV: Mild decrease in LVEF, inferoseptal hypokinesis.  No significant mitral regurgitation. RCA: Large-caliber vessel, smooth and normal. LAD: Gives origin to a very large diagonal 1 and several small diagonals.  It is smooth and normal. LCx: Large-caliber vessel, continues his OM1 and OM 2 after giving origin to a small AV groove branch.  Smooth and normal. Impression and recommendations: Findings consistent with nonischemic cardiomyopathy with very  mild inferoseptal hypokinesis with normal EDP, EF 45% approximately.     Medications:     Scheduled Medications:  atorvastatin  80 mg Per Tube Daily   Chlorhexidine Gluconate Cloth  6 each Topical Daily   docusate  100 mg Per Tube BID   fentaNYL (SUBLIMAZE) injection  50 mcg Intravenous Once   heparin injection (subcutaneous)  5,000 Units Subcutaneous Q8H   ipratropium-albuterol  3 mL Nebulization Q4H   multivitamin with minerals  1 tablet Per Tube Daily   nitroGLYCERIN  1 inch Topical Q8H   mouth rinse  15 mL Mouth Rinse Q2H   pantoprazole (PROTONIX) IV  40 mg Intravenous Q12H   polyethylene glycol  17 g Per Tube Daily   senna  1 tablet Per Tube Daily   sodium chloride flush  3 mL Intravenous Q12H    Infusions:  sodium chloride 10 mL/hr at 12/07/23 1000  sodium chloride Stopped (12/06/23 1720)   sodium chloride     sodium chloride Stopped (12/06/23 2054)   ceFEPime (MAXIPIME) IV 200 mL/hr at 12/07/23 1000   epinephrine 2 mcg/min (12/07/23 1000)   fentaNYL infusion INTRAVENOUS 150 mcg/hr (12/07/23 1000)   insulin 4.6 Units/hr (12/07/23 1022)   lactated ringers 10 mL/hr at 12/07/23 1000   lidocaine 1 mg/min (12/07/23 1000)   magnesium sulfate bolus IVPB     methylPREDNISolone (SOLU-MEDROL) injection Stopped (12/06/23 2227)   midazolam 3 mg/hr (12/07/23 1000)   milrinone 0.25 mcg/kg/min (12/07/23 1000)   norepinephrine (LEVOPHED) Adult infusion 15 mcg/min (12/07/23 1000)   vancomycin     vasopressin 0.04 Units/min (12/07/23 1000)    PRN Medications: sodium chloride, sodium chloride, acetaminophen (TYLENOL) oral liquid 160 mg/5 mL, dextrose, docusate, fentaNYL, midazolam, mouth rinse, polyethylene glycol, prochlorperazine, sodium chloride flush    Patient Profile  53 y.o. female with history of chronic chest pain (prior cath with clean cors 2023), tobacco use, GERD, obesity, urinary incontinence s/p bladder sling, repaired CSF leak.   Admitted 12/05/23 after syncopal  episode at home. On 01/30 had resuscitated VF arrest followed by torsades/recurrent VF d/t R on T phenomenon.   Assessment/Plan  Recurrent VF arrest: -01/30 had resuscitated VF arrest followed by torsades/recurrent VF d/t R on T phenomenon. Temp pacer placed in cath lab. No recurrent ventricular arrhythmias overnight. Remains on lidocaine gtt. Off amiodarone. -Cardiac cath with no significant CAD or evidence of SCAD -Echo 01/30: EF 40-45%, septal AK, no aortic dissection -Etiology unclear. Empirically started on steroids. ? Myocarditis vs sarcoid. -Will need eventual cMRI. -EP consult   2. Acute systolic CHF with prominant RV failure >> cardiogenic shock: -CI severely reduced at 1.25 this am with PAPi of 0.4.  -Remains on vaso, epi and ne. Weaning ne as able. Start 0.25 milrinone -Lactic acid peaked at 7.5, coming down. Trend to clearance. -CVP 17 in setting of RV failure. Diurese later today if remains stable.    3. Acute respiratory failure with hypercarbia and hypoxia -Vent management per CCM -Broad abx with Vanc + Cefepime   4. Hypokalemia Hypomagnesemia Hypocalcemia -Lytes supplemented aggressively  5. ID -WBCs 10>25>45>32K last 2 days -Abx as above -BC X 2 NGTD -UTI. Urine culture pending, >100K GNR  Length of Stay: 1  Ahana Najera N, PA-C  12/07/2023, 12:43 PM  Advanced Heart Failure Team Pager [phone] (M-F; 7a - 5p)  Please contact [facility] for night-coverage after hours (5p -7a ) and weekends on [email or web address]

## 2023-12-08 DIAGNOSIS — I469 Cardiac arrest, cause unspecified: Secondary | ICD-10-CM | POA: Diagnosis not present

## 2023-12-08 LAB — GLUCOSE, CAPILLARY
Glucose-Capillary: 195 mg/dL — ABNORMAL HIGH (ref 70–99)
Glucose-Capillary: 206 mg/dL — ABNORMAL HIGH (ref 70–99)
Glucose-Capillary: 189 mg/dL — ABNORMAL HIGH (ref 70–99)
Glucose-Capillary: 185 mg/dL — ABNORMAL HIGH (ref 70–99)
Glucose-Capillary: 167 mg/dL — ABNORMAL HIGH (ref 70–99)
Glucose-Capillary: 185 mg/dL — ABNORMAL HIGH (ref 70–99)
Glucose-Capillary: 156 mg/dL — ABNORMAL HIGH (ref 70–99)
Glucose-Capillary: 124 mg/dL — ABNORMAL HIGH (ref 70–99)
Glucose-Capillary: 118 mg/dL — ABNORMAL HIGH (ref 70–99)
Glucose-Capillary: 167 mg/dL — ABNORMAL HIGH (ref 70–99)
Glucose-Capillary: 200 mg/dL — ABNORMAL HIGH (ref 70–99)
Glucose-Capillary: 194 mg/dL — ABNORMAL HIGH (ref 70–99)
Glucose-Capillary: 207 mg/dL — ABNORMAL HIGH (ref 70–99)
Glucose-Capillary: 206 mg/dL — ABNORMAL HIGH (ref 70–99)
Glucose-Capillary: 113 mg/dL — ABNORMAL HIGH (ref 70–99)
Glucose-Capillary: 186 mg/dL — ABNORMAL HIGH (ref 70–99)
Glucose-Capillary: 151 mg/dL — ABNORMAL HIGH (ref 70–99)
Glucose-Capillary: 176 mg/dL — ABNORMAL HIGH (ref 70–99)
Glucose-Capillary: 160 mg/dL — ABNORMAL HIGH (ref 70–99)
Glucose-Capillary: 188 mg/dL — ABNORMAL HIGH (ref 70–99)

## 2023-12-08 LAB — BASIC METABOLIC PANEL
Anion gap: 9 (ref 5–15)
Anion gap: 8 (ref 5–15)
BUN: 29 mg/dL — ABNORMAL HIGH (ref 6–20)
BUN: 36 mg/dL — ABNORMAL HIGH (ref 6–20)
CO2: 22 mmol/L (ref 22–32)
CO2: 23 mmol/L (ref 22–32)
Calcium: 7.9 mg/dL — ABNORMAL LOW (ref 8.9–10.3)
Calcium: 7.5 mg/dL — ABNORMAL LOW (ref 8.9–10.3)
Chloride: 112 mmol/L — ABNORMAL HIGH (ref 98–111)
Chloride: 112 mmol/L — ABNORMAL HIGH (ref 98–111)
Creatinine, Ser: 1.11 mg/dL — ABNORMAL HIGH (ref 0.44–1.00)
Creatinine, Ser: 1.09 mg/dL — ABNORMAL HIGH (ref 0.44–1.00)
GFR, Estimated: 59 mL/min — ABNORMAL LOW (ref >60)
GFR, Estimated: >60 mL/min (ref >60)
Glucose, Bld: 194 mg/dL — ABNORMAL HIGH (ref 70–99)
Glucose, Bld: 211 mg/dL — ABNORMAL HIGH (ref 70–99)
Potassium: 4 mmol/L (ref 3.5–5.1)
Potassium: 3.5 mmol/L (ref 3.5–5.1)
Sodium: 143 mmol/L (ref 135–145)
Sodium: 143 mmol/L (ref 135–145)

## 2023-12-08 LAB — CBC
HCT: 31.8 % — ABNORMAL LOW (ref 36.0–46.0)
Hemoglobin: 10.6 g/dL — ABNORMAL LOW (ref 12.0–15.0)
MCH: 30.4 pg (ref 26.0–34.0)
MCHC: 33.3 g/dL (ref 30.0–36.0)
MCV: 91.1 fL (ref 80.0–100.0)
Platelets: 160 10*3/uL (ref 150–400)
RBC: 3.49 MIL/uL — ABNORMAL LOW (ref 3.87–5.11)
RDW: 14.3 % (ref 11.5–15.5)
WBC: 33.7 10*3/uL — ABNORMAL HIGH (ref 4.0–10.5)
nRBC: 0.1 % (ref 0.0–0.2)

## 2023-12-08 LAB — COOXEMETRY PANEL
Carboxyhemoglobin: 1.8 % — ABNORMAL HIGH (ref 0.5–1.5)
Methemoglobin: <0.7 % (ref 0.0–1.5)
O2 Saturation: 75.4 %
Total hemoglobin: 10.6 g/dL — ABNORMAL LOW (ref 12.0–16.0)

## 2023-12-08 LAB — MAGNESIUM
Magnesium: 2.3 mg/dL (ref 1.7–2.4)
Magnesium: 2.1 mg/dL (ref 1.7–2.4)

## 2023-12-08 LAB — PHOSPHORUS
Phosphorus: 1.8 mg/dL — ABNORMAL LOW (ref 2.5–4.6)
Phosphorus: 3.8 mg/dL (ref 2.5–4.6)

## 2023-12-08 LAB — CG4 I-STAT (LACTIC ACID): Lactic Acid, Venous: 1.1 mmol/L (ref 0.5–1.9)

## 2023-12-08 MED ORDER — PIPERACILLIN-TAZOBACTAM 3.375 G IVPB
3.3750 g | Freq: Three times a day (TID) | INTRAVENOUS | Status: AC
  Administered 2023-12-08 – 2023-12-11 (×11): 3.375 g via INTRAVENOUS
  Filled 2023-12-08 (×12): qty 50

## 2023-12-08 MED ORDER — POTASSIUM CHLORIDE 20 MEQ PO PACK: 40.0000 meq | PACK | Freq: Once | ORAL | Status: DC

## 2023-12-08 MED ORDER — INSULIN ASPART 100 UNIT/ML IJ SOLN
0.0000 [IU] | INTRAMUSCULAR | Status: DC
  Administered 2023-12-08 – 2023-12-09 (×2): 3 [IU] via SUBCUTANEOUS
  Administered 2023-12-09 (×3): 8 [IU] via SUBCUTANEOUS
  Administered 2023-12-09: 3 [IU] via SUBCUTANEOUS
  Administered 2023-12-10: 5 [IU] via SUBCUTANEOUS
  Administered 2023-12-10 (×2): 3 [IU] via SUBCUTANEOUS
  Administered 2023-12-10 – 2023-12-11 (×2): 2 [IU] via SUBCUTANEOUS
  Administered 2023-12-11: 5 [IU] via SUBCUTANEOUS
  Administered 2023-12-11: 2 [IU] via SUBCUTANEOUS
  Administered 2023-12-12: 8 [IU] via SUBCUTANEOUS
  Administered 2023-12-12: 5 [IU] via SUBCUTANEOUS
  Administered 2023-12-12: 2 [IU] via SUBCUTANEOUS
  Administered 2023-12-13: 3 [IU] via SUBCUTANEOUS
  Administered 2023-12-13 (×2): 2 [IU] via SUBCUTANEOUS
  Administered 2023-12-13: 8 [IU] via SUBCUTANEOUS
  Administered 2023-12-14: 5 [IU] via SUBCUTANEOUS
  Administered 2023-12-14: 2 [IU] via SUBCUTANEOUS
  Administered 2023-12-14: 3 [IU] via SUBCUTANEOUS
  Administered 2023-12-14: 2 [IU] via SUBCUTANEOUS
  Administered 2023-12-14 – 2023-12-15 (×2): 3 [IU] via SUBCUTANEOUS
  Administered 2023-12-15 (×2): 5 [IU] via SUBCUTANEOUS
  Administered 2023-12-15 – 2023-12-16 (×2): 3 [IU] via SUBCUTANEOUS
  Administered 2023-12-16: 2 [IU] via SUBCUTANEOUS
  Administered 2023-12-16: 8 [IU] via SUBCUTANEOUS
  Administered 2023-12-16: 3 [IU] via SUBCUTANEOUS
  Administered 2023-12-16: 8 [IU] via SUBCUTANEOUS
  Administered 2023-12-17: 3 [IU] via SUBCUTANEOUS
  Administered 2023-12-17: 2 [IU] via SUBCUTANEOUS

## 2023-12-08 MED ORDER — FUROSEMIDE 10 MG/ML IJ SOLN
80.0000 mg | Freq: Once | INTRAMUSCULAR | Status: AC
  Administered 2023-12-08: 80 mg via INTRAVENOUS
  Filled 2023-12-08: qty 8

## 2023-12-08 MED ORDER — POTASSIUM PHOSPHATES 15 MMOLE/5ML IV SOLN
30.0000 mmol | Freq: Once | INTRAVENOUS | Status: AC
  Administered 2023-12-08: 30 mmol via INTRAVENOUS
  Filled 2023-12-08: qty 10

## 2023-12-08 MED ORDER — INSULIN GLARGINE-YFGN 100 UNIT/ML ~~LOC~~ SOLN
5.0000 [IU] | Freq: Every day | SUBCUTANEOUS | Status: DC
  Administered 2023-12-08: 5 [IU] via SUBCUTANEOUS
  Filled 2023-12-08 (×2): qty 0.05

## 2023-12-08 MED ORDER — INSULIN REGULAR(HUMAN) IN NACL 100-0.9 UT/100ML-% IV SOLN: INTRAVENOUS | Status: DC

## 2023-12-08 MED ORDER — INSULIN ASPART 100 UNIT/ML IJ SOLN: 0.0000 [IU] | INTRAMUSCULAR | Status: DC

## 2023-12-08 MED ORDER — POTASSIUM CHLORIDE 20 MEQ PO PACK
40.0000 meq | PACK | Freq: Once | ORAL | Status: AC
  Administered 2023-12-08: 40 meq
  Filled 2023-12-08: qty 2

## 2023-12-08 NOTE — Progress Notes (Signed)
Pharmacy Antibiotic Note  Deanna Scott is a 53 y.o. female admitted on 12/05/2023 with sepsis -r/o bacteremia initially started on vancomycin and cefepime.  WBC up in setting of steroids but now spiking fever 100.9.    Pharmacy has been consulted to change to zosyn dosing for GNR UTI and possible intra abdominal process.   Plan: Stop vancomycin and cefepime  Zosyn 3.375 gm IV q8h EI   Height: 5\' 2"  (157.5 cm) Weight: 101.5 kg (223 lb 12.3 oz) IBW/kg (Calculated) : 50.1  Temp (24hrs), Avg:100.2 F (37.9 C), Min:97.7 F (36.5 C), Max:100.9 F (38.3 C)  Recent Labs  Lab 12/06/23 1525 12/06/23 1734 12/06/23 1850 12/06/23 2004 12/06/23 2017 12/07/23 0306 12/07/23 0601 12/07/23 0850 12/07/23 0948 12/07/23 1019 12/07/23 1228 12/07/23 1310 12/07/23 1634 12/08/23 0411  WBC 24.9*  --  45.0*  --   --  32.5*  --   --   --   --   --   --  38.1* 33.7*  CREATININE 1.14*   < >  --    < >  --  1.41*  --   --  1.39*  --  1.25*  --  1.12* 1.11*  LATICACIDVEN 3.7*   < >  --   --  7.5*  --  5.7* 4.4*  --  3.0*  --  2.8*  --   --    < > = values in this interval not displayed.    Estimated Creatinine Clearance: 64.7 mL/min (A) (by C-G formula based on SCr of 1.11 mg/dL (H)).    Allergies  Allergen Reactions   Latex Itching and Dermatitis   Norco [Hydrocodone-Acetaminophen] Other (See Comments)    CNS dysphoria   Percocet [Oxycodone-Acetaminophen] Other (See Comments)    CNS Dysphoria    Antimicrobials this admission: 1/30 Vanc >> 2/1 1/30 Cefepime >> 2/1 1/29 Rocephin >>1/30 1/30 Unasyn x 1  Microbiology results: 1/29 BCx: ngtd 1/29 UCx:  GNR 1/30 MRSA PCR: negative   Leota Sauers Pharm.D. CPP, BCPS Clinical Pharmacist [phone] 12/08/2023 10:17 AM

## 2023-12-08 NOTE — Progress Notes (Signed)
   12/08/23 2158  Provider Notification  Provider Name/Title Kathie Rhodes)  Helmut Muster, Lab  Date Provider Notified 12/08/23  Time Provider Notified 2158  Method of Notification Call  Notification Reason Other (Comment) Alinda Money [facility] awaiting lidocaine level ; lab drawn @ 10 am, results pending.)  Provider response Other (Comment) (Lidocaine level is a lab sent out to [facility] per Helmut Muster, [employer]; Alinda Money [facility] notified.)  Date of Provider Response 12/08/23  Time of Provider Response 2200

## 2023-12-08 NOTE — Plan of Care (Signed)
  Problem: Education: Goal: Knowledge of General Education information will improve Description: Including pain rating scale, medication(s)/side effects and non-pharmacologic comfort measures Outcome: Progressing   Problem: Health Behavior/Discharge Planning: Goal: Ability to manage health-related needs will improve Outcome: Progressing   Problem: Clinical Measurements: Goal: Ability to maintain clinical measurements within normal limits will improve Outcome: Progressing Goal: Will remain free from infection Outcome: Progressing Goal: Diagnostic test results will improve Outcome: Progressing Goal: Respiratory complications will improve Outcome: Progressing Goal: Cardiovascular complication will be avoided Outcome: Progressing   Problem: Activity: Goal: Risk for activity intolerance will decrease Outcome: Progressing   Problem: Nutrition: Goal: Adequate nutrition will be maintained Outcome: Progressing   Problem: Coping: Goal: Level of anxiety will decrease Outcome: Progressing   Problem: Elimination: Goal: Will not experience complications related to bowel motility Outcome: Progressing Goal: Will not experience complications related to urinary retention Outcome: Progressing   Problem: Pain Managment: Goal: General experience of comfort will improve and/or be controlled Outcome: Progressing   Problem: Safety: Goal: Ability to remain free from injury will improve Outcome: Progressing   Problem: Skin Integrity: Goal: Risk for impaired skin integrity will decrease Outcome: Progressing   Problem: Education: Goal: Understanding of CV disease, CV risk reduction, and recovery process will improve Outcome: Progressing Goal: Individualized Educational Video(s) Outcome: Progressing   Problem: Activity: Goal: Ability to return to baseline activity level will improve Outcome: Progressing   Problem: Cardiovascular: Goal: Ability to achieve and maintain adequate  cardiovascular perfusion will improve Outcome: Progressing Goal: Vascular access site(s) Level 0-1 will be maintained Outcome: Progressing   Problem: Health Behavior/Discharge Planning: Goal: Ability to safely manage health-related needs after discharge will improve Outcome: Progressing   Problem: Safety: Goal: Non-violent Restraint(s) Outcome: Progressing

## 2023-12-08 NOTE — Plan of Care (Signed)
   Problem: Education: Goal: Knowledge of General Education information will improve Description: Including pain rating scale, medication(s)/side effects and non-pharmacologic comfort measures Outcome: Progressing   Problem: Activity: Goal: Risk for activity intolerance will decrease Outcome: Progressing   Problem: Coping: Goal: Level of anxiety will decrease Outcome: Progressing

## 2023-12-08 NOTE — Progress Notes (Signed)
NAME:  Deanna Scott, MRN:  [id number], DOB:  01-02-1971, LOS: 2 ADMISSION DATE:  12/05/2023, CONSULTATION DATE:  12/06/23 REFERRING MD:  FPTS , CHIEF COMPLAINT:  CPR in progress    History of Present Illness:   53 yo F PMH HTN, HLD, CSF leak sp repair, tobacco use, migraines who was admitted to [facility] 1/29 after presenting with reported AMS, though as additional information has been obtained this sounds like it was related to a syncopal event. Admitting though possible c/w sz, so an MRI brain was obtained which was unrevealing  On 1/29 night into 1/30 the pt was having intermittent chest pain and DOE. She had a marginally elevated hs trop 1/29 evening at 161, remained in the 100s into 1/30. An abnormal rhythm was noted by ED nursing 1/30 around 0700. . Later started on hep gtt . Multiple EKGs obtained.  Cardiology was consulted.   Shortly after, patient c/o chest pain, became bradycardic and arrested. PCCM consulted while compressions were in progress to weigh in on if TPA / tnkase should be considered.   Both PCCM and cardiology arrived to bedside essentially at the same time.  ROSC was achieved on our arrival. There is not a great timeline of arrest duration, but sounds like 15 min - 20 min. Did not get RSI meds during peri-arrest intubation.   Is having some movements following ROSC.    EKGs reviewed 631- abnormal T waves, abnormal R wave progression   801- repolarization abnormality c/f anterolateral ischemia  1202- brady, RBBB concern for acute lateral MI   Tele review w some episodes of CHB, maybe episode of VF   Sounds like she arrested shortly after 1202 EKG  Istat labs w K 2.2 iCal 0.6 hgb 7  CBG 66, got 2 amp   Pertinent  Medical History  HTN HLD Repaired CSF leak   Significant Hospital Events: Including procedures, antibiotic start and stop dates in addition to other pertinent events   1/29 syncope chest pain OOH. AdmitFPTS to obv. MRI neg. Progressive chest pain 1/29-30  night. Mild trop bump 1/30 ongoing CP. EKG changes. Bradycardic and arrested. Cards consulted right before arrest. PCCM consulted during code.  Cqath with clean cors. Later in day torsades. Return to CL for TVP  1/31 ongoing lyte replacement. Adding milrinone    Interim History / Subjective:  Spiked a fever to Tmax 100.8, now afebrile Still remain on multiple vasopressor support, Levophed at 10 mics, vasopressin 0.04, epinephrine at 1 and milrinone 0.25  Objective   Blood pressure (!) 114/58, pulse 96, temperature 100.2 F (37.9 C), resp. rate (!) 24, height 5\' 2"  (1.575 m), weight 101.5 kg, last menstrual period 03/02/2012, SpO2 94%. PAP: (17-29)/(9-24) 22/12 CVP:  [4 mmHg-15 mmHg] 6 mmHg PCWP:  [17 mmHg-19 mmHg] 17 mmHg CO:  [3.5 L/min-6.4 L/min] 6.4 L/min CI:  [1.79 L/min/m2-3.3 L/min/m2] 3.3 L/min/m2  Vent Mode: PRVC FiO2 (%):  [40 %] 40 % Set Rate:  [24 bmp] 24 bmp Vt Set:  [440 mL] 440 mL PEEP:  [8 cmH20] 8 cmH20 Plateau Pressure:  [12 cmH20-15 cmH20] 14 cmH20   Intake/Output Summary (Last 24 hours) at 12/08/2023 0931 Last data filed at 12/08/2023 2440 Gross per 24 hour  Intake 3535.28 ml  Output 895 ml  Net 2640.28 ml   Filed Weights   12/07/23 0500 12/07/23 1338 12/08/23 0500  Weight: 101 kg 101 kg 101.5 kg    Examination: General: Crtitically ill-appearing female, orally intubated HEENT: [place]/AT, eyes anicteric.  ETT  and OGT in place Neuro: Trying to open eyes with vocal stimuli, able to move all 4 extremities Chest: Coarse breath sounds, no wheezes or rhonchi Heart: Regular rate and rhythm, no murmurs or gallops Abdomen: Soft, nondistended, bowel sounds present Skin: No rash  Urine culture growing gram-negative rods  WBC 33k Coox 75% Phos 1.8 Lactic 2.8  Resolved Hospital Problem list     Assessment & Plan:  Status post V-fib cardiac arrest, likely in the setting of hypokalemia/hypomagnesemia Acute HFrEF with cardiogenic shock Demand cardiac ischemia 1st  arrest 1/30. Return to CL 1/30 after VF/Torsades arrest for TVP.  LVEF is 40 to 45% with akinesis/hypokinesis of septal wall  Advanced heart failure team is following Continue to titrate vasopressor support, currently on epinephrine, norepinephrine and vasopressin Continue milrinone at 0.25 Continue lidocaine at 1 Avoid amiodarone considering To[place]ade  Will stop the steroid as autoimmune/inflammatory causes are less likely considering normal ESR Will try Lasix 80 mg x 1 as her urine output is low Ischemic workup is negative  Acute respiratory failure with hypercarbia and hypoxia Aspiration pneumonia Currently on 50% FiO2 and PEEP at 10 Continue lung protective ventilation VAP prevention bundle in place PAD protocol with Versed and fentanyl, titrate with RASS goal -1 Switch antibiotics to Zosyn MRSA screen is negative will stop vancomycin  Hypokalemia Hypomagnesemia Hypocalcemia Hypophosphatemia Continue aggressive electrolyte replacement  AKI Metabolic acidosis Serum creatinine continue to trend down Monitor intake and output Avoid nephrotoxic agent Lactate is trended down to 0.6  DM2 w steroid induced hyperglycemia Currently on insulin infusion Steroids were stopped Will transition to sliding scale insulin  Acute urinary tract infection with gram-negative rods Urine culture grew gram-negative rods She will be on Zosyn Follow-up sensitivity and adjust antibiotic accordingly Spiked fever with Tmax of 100.8 White count is up combination of steroid therapy and infection  IV extravasation Received phentolamine   Morbid obesity Diet and exercise counseling as appropriate  Best Practice (right click and "Reselect all SmartList Selections" daily)   Diet/type: NPO -- start EN DVT prophylaxis prophylactic heparin  Pressure ulcer(s): pressure ulcer assessment deferred  GI prophylaxis: PPI Lines: Central line, Arterial Line, and yes and it is still needed Foley:  Yes,  and it is still needed Code Status:  full code Last date of multidisciplinary goals of care discussion [family updated 1/30 ]  Labs   CBC: Recent Labs  Lab 12/05/23 1003 12/05/23 1140 12/06/23 1525 12/06/23 1758 12/06/23 1850 12/06/23 2003 12/07/23 0306 12/07/23 1014 12/07/23 1016 12/07/23 1634 12/08/23 0411  WBC 8.3   < > 24.9*  --  45.0*  --  32.5*  --   --  38.1* 33.7*  NEUTROABS 6.0  --   --   --   --   --   --   --   --  31.8*  --   HGB 14.2   < > 14.4   < > 13.2   < > 13.7 12.6 12.9 12.3 10.6*  HCT 43.3   < > 44.4   < > 40.5   < > 42.3 37.0 39.4 36.5 31.8*  MCV 90.8   < > 92.5  --  92.3  --  92.6  --   --  89.9 91.1  PLT 261   < > 352  --  315  --  263  --   --  203 160   < > = values in this interval not displayed.    Basic Metabolic Panel: Recent Labs  Lab 12/06/23 1734 12/06/23 1758 12/06/23 2004 12/06/23 2202 12/07/23 0306 12/07/23 0948 12/07/23 1014 12/07/23 1228 12/07/23 1634 12/08/23 0411  NA 143   < > 141   < > 142 143 146* 145 145 143  K 2.9*   < > 4.0   < > 2.9* 3.8 4.0 4.0 4.2 4.0  CL 107   < > 108  --  112* 112*  --  113* 113* 112*  CO2 21*  --  18*  --  13* 20*  --  22 21* 22  GLUCOSE 411*   < > 465*  --  324* 173*  --  178* 176* 194*  BUN 14   < > 16  --  19 20  --  21* 23* 29*  CREATININE 1.22*   < > 1.24*  --  1.41* 1.39*  --  1.25* 1.12* 1.11*  CALCIUM 8.7*  --  8.1*  --  8.0* 8.3*  --  7.9* 7.9* 7.9*  MG 4.8*  --  4.0*  --  2.9*  --   --   --  2.2 2.3  PHOS  --   --   --   --   --   --   --  2.4* 2.5 1.8*   < > = values in this interval not displayed.   GFR: Estimated Creatinine Clearance: 64.7 mL/min (A) (by C-G formula based on SCr of 1.11 mg/dL (H)). Recent Labs  Lab 12/06/23 1850 12/06/23 2017 12/07/23 0306 12/07/23 0601 12/07/23 0850 12/07/23 1019 12/07/23 1310 12/07/23 1634 12/08/23 0411  WBC 45.0*  --  32.5*  --   --   --   --  38.1* 33.7*  LATICACIDVEN  --    < >  --  5.7* 4.4* 3.0* 2.8*  --   --    < > = values in  this interval not displayed.    Liver Function Tests: Recent Labs  Lab 12/05/23 1003 12/06/23 0402 12/06/23 1734  AST 77* 32 348*  ALT 38 24 149*  ALKPHOS 111 82 89  BILITOT 0.8 0.9 0.9  PROT 7.1 6.4* 5.2*  ALBUMIN 3.8 3.4* 2.6*   No results for input(s): "LIPASE", "AMYLASE" in the last 168 hours. Recent Labs  Lab 12/05/23 1217  AMMONIA 19    ABG    Component Value Date/Time   PHART 7.270 (L) 12/07/2023 1014   PCO2ART 40.7 12/07/2023 1014   PO2ART 158 (H) 12/07/2023 1014   HCO3 18.6 (L) 12/07/2023 1014   TCO2 20 (L) 12/07/2023 1014   ACIDBASEDEF 8.0 (H) 12/07/2023 1014   O2SAT 75.4 12/08/2023 0515     Coagulation Profile: Recent Labs  Lab 12/06/23 1734  INR 1.4*    Cardiac Enzymes: No results for input(s): "CKTOTAL", "CKMB", "CKMBINDEX", "TROPONINI" in the last 168 hours.  HbA1C: HbA1c, POC (prediabetic range)  Date/Time Value Ref Range Status  09/21/2023 03:50 PM 6.2 5.7 - 6.4 % Final  06/08/2022 03:07 PM 6.1 5.7 - 6.4 % Final   Hgb A1c MFr Bld  Date/Time Value Ref Range Status  10/19/2023 03:46 PM 6.6 (H) 4.8 - 5.6 % Final    Comment:             Prediabetes: 5.7 - 6.4          Diabetes: >6.4          Glycemic control for adults with diabetes: <7.0     CBG: Recent Labs  Lab 12/08/23 0407 12/08/23 0510 12/08/23 0640 12/08/23  0756 12/08/23 0907  GLUCAP 185* 167* 185* 156* 124*     The patient is critically ill due to acute HFrEF with cardiogenic shock/status postcardiac arrest.  Critical care was necessary to treat or prevent imminent or life-threatening deterioration.  Critical care was time spent personally by me on the following activities: development of treatment plan with patient and/or surrogate as well as nursing, discussions with consultants, evaluation of patient's response to treatment, examination of patient, obtaining history from patient or surrogate, ordering and performing treatments and interventions, ordering and review of  laboratory studies, ordering and review of radiographic studies, pulse oximetry, re-evaluation of patient's condition and participation in multidisciplinary rounds.   During this encounter critical care time was devoted to patient care services described in this note for 42 minutes.     Cheri Fowler, MD [facility] See Amion for pager If no response to pager, please call [phone] until 7pm After 7pm, Please call E-link [phone]

## 2023-12-08 NOTE — Progress Notes (Signed)
Advanced Heart Failure Rounding Note  Cardiologist: Chilton Si, MD   Chief Complaint: VF arrest  Subjective:     Swan #s: CVP 6 PA 22/12 CO 6.8 CI 3.5  Normal mental status when awakened yesterday. Significant improvement in hemodynamics yesterday, suspect mostly due to post arrest stunning.    Plan to remove temp pacer and swan tomorrow if remains stable.    Objective:   Weight Range: 101.5 kg Body mass index is 40.93 kg/m.   Vital Signs:   Temp:  [97.7 F (36.5 C)-100.9 F (38.3 C)] 100.2 F (37.9 C) (02/01 0700) Pulse Rate:  [94-117] 96 (02/01 0700) Resp:  [17-25] 24 (02/01 0700) BP: (114)/(58) 114/58 (01/31 1338) SpO2:  [90 %-99 %] 94 % (02/01 0810) Arterial Line BP: (89-150)/(51-78) 127/53 (02/01 0700) FiO2 (%):  [40 %-50 %] 50 % (02/01 1124) Weight:  [101 kg-101.5 kg] 101.5 kg (02/01 0500) Last BM Date :  (PTA)  Weight change: Filed Weights   12/07/23 0500 12/07/23 1338 12/08/23 0500  Weight: 101 kg 101 kg 101.5 kg    Intake/Output:   Intake/Output Summary (Last 24 hours) at 12/08/2023 1137 Last data filed at 12/08/2023 1103 Gross per 24 hour  Intake 3903.01 ml  Output 895 ml  Net 3008.01 ml      Physical Exam    General: Critically ill appearing. HEENT: + ETT Neck: R internal jugular temp pacer, L internal jugular Swan Cor: Regular rate & rhythm. No rubs, gallops or murmurs. Lungs: Mechanical breath sounds Abdomen: Obese, soft, nondistended Extremities: LUE swelling from IV infiltrate. RLE I/O. 1+ edema. Neuro: Sedated on vent. Opens eyes.   Telemetry   SR. No recurrent ventricular arrhythmias overnight.  Labs    CBC Recent Labs    12/07/23 1634 12/08/23 0411  WBC 38.1* 33.7*  NEUTROABS 31.8*  --   HGB 12.3 10.6*  HCT 36.5 31.8*  MCV 89.9 91.1  PLT 203 160   Basic Metabolic Panel Recent Labs    82/95/62 1634 12/08/23 0411  NA 145 143  K 4.2 4.0  CL 113* 112*  CO2 21* 22  GLUCOSE 176* 194*  BUN 23* 29*   CREATININE 1.12* 1.11*  CALCIUM 7.9* 7.9*  MG 2.2 2.3  PHOS 2.5 1.8*   Liver Function Tests Recent Labs    12/06/23 0402 12/06/23 1734  AST 32 348*  ALT 24 149*  ALKPHOS 82 89  BILITOT 0.9 0.9  PROT 6.4* 5.2*  ALBUMIN 3.4* 2.6*   No results for input(s): "LIPASE", "AMYLASE" in the last 72 hours. Cardiac Enzymes No results for input(s): "CKTOTAL", "CKMB", "CKMBINDEX", "TROPONINI" in the last 72 hours.  BNP: BNP (last 3 results) No results for input(s): "BNP" in the last 8760 hours.  ProBNP (last 3 results) No results for input(s): "PROBNP" in the last 8760 hours.   D-Dimer No results for input(s): "DDIMER" in the last 72 hours. Hemoglobin A1C No results for input(s): "HGBA1C" in the last 72 hours. Fasting Lipid Panel No results for input(s): "CHOL", "HDL", "LDLCALC", "TRIG", "CHOLHDL", "LDLDIRECT" in the last 72 hours. Thyroid Function Tests Recent Labs    12/05/23 2229  TSH 2.035    Other results:     Medications:     Scheduled Medications:  atorvastatin  80 mg Per Tube Daily   Chlorhexidine Gluconate Cloth  6 each Topical Daily   docusate  100 mg Per Tube BID   feeding supplement (PROSource TF20)  60 mL Per Tube Daily   heparin injection (  subcutaneous)  5,000 Units Subcutaneous Q8H   ipratropium-albuterol  3 mL Nebulization Q4H   multivitamin with minerals  1 tablet Per Tube Daily   nitroGLYCERIN  1 inch Topical Q8H   mouth rinse  15 mL Mouth Rinse Q2H   pantoprazole (PROTONIX) IV  40 mg Intravenous Q12H   polyethylene glycol  17 g Per Tube Daily   senna  1 tablet Per Tube Daily    Infusions:  sodium chloride 10 mL/hr at 12/08/23 1103   epinephrine Stopped (12/08/23 0945)   feeding supplement (VITAL AF 1.2 CAL) 30 mL/hr at 12/08/23 1103   fentaNYL infusion INTRAVENOUS 150 mcg/hr (12/08/23 1042)   insulin 2 Units/hr (12/08/23 1103)   lidocaine 1 mg/min (12/08/23 1103)   magnesium sulfate bolus IVPB     midazolam 5 mg/hr (12/08/23 1103)    milrinone 0.25 mcg/kg/min (12/08/23 1103)   norepinephrine (LEVOPHED) Adult infusion 8 mcg/min (12/08/23 1103)   piperacillin-tazobactam (ZOSYN)  IV     potassium PHOSPHATE IVPB (in mmol) 85 mL/hr at 12/08/23 1103   vasopressin 0.03 Units/min (12/08/23 1103)    PRN Medications: sodium chloride, acetaminophen (TYLENOL) oral liquid 160 mg/5 mL, dextrose, docusate, fentaNYL, midazolam, mouth rinse, polyethylene glycol, prochlorperazine    Patient Profile  53 y.o. female with history of chronic chest pain (prior cath with clean cors 2023), tobacco use, GERD, obesity, urinary incontinence s/p bladder sling, repaired CSF leak.   Admitted 12/05/23 after syncopal episode at home. On 01/30 had resuscitated VF arrest followed by torsades/recurrent VF d/t R on T phenomenon.   Assessment/Plan  Recurrent VF arrest: -01/30 had resuscitated VF arrest followed by torsades/recurrent VF d/t R on T phenomenon. Temp pacer placed in cath lab. No recurrent ventricular arrhythmias overnight. Remains on lidocaine gtt. Off amiodarone. -Cardiac cath with no significant CAD or evidence of SCAD -Echo 01/30: EF 40-45%, septal AK, no aortic dissection -Stopping steroids given infection, normal CRP and ESR on admit -Will need eventual cMRI -EP consult -Suspect may be electrolyte induced but will need MRI to look for vasospastic MI/septal wall abnormality. Potentially early next week after lines removed   2. Acute systolic CHF with prominant RV failure >> cardiogenic shock: -CI severely reduced at 1.25 this am with PAPi of 0.4.  -Improved hemodynamics on milrinone, normalized CO/CI and significantly increased PAPi - Stop milrinone today, continue to wean vasopressors -Hold further diuresis   3. Acute respiratory failure with hypercarbia and hypoxia/UTI/leukocytosis -Vent management per CCM -Broad abx with Vanc + Cefepime, GNR in the urine, Bcx negative - Stopping steroids as above   4.  Hypokalemia Hypomagnesemia Hypocalcemia -Lytes supplemented aggressively   Length of Stay: 2  Romie Minus, MD  12/08/2023, 11:37 AM  Advanced Heart Failure Team Pager [phone] (M-F; 7a - 5p)  Please contact [facility] for night-coverage after hours (5p -7a ) and weekends on [email or web address]  CRITICAL CARE Performed by: Romie Minus   Total critical care time: 38 minutes  Critical care time was exclusive of separately billable procedures and treating other patients.  Critical care was necessary to treat or prevent imminent or life-threatening deterioration.  Critical care was time spent personally by me on the following activities: development of treatment plan with patient and/or surrogate as well as nursing, discussions with consultants, evaluation of patient's response to treatment, examination of patient, obtaining history from patient or surrogate, ordering and performing treatments and interventions, ordering and review of laboratory studies, ordering and review of radiographic studies, pulse oximetry and re-evaluation of  patient's condition.

## 2023-12-08 NOTE — Progress Notes (Addendum)
eLink Physician-Brief Progress Note Patient Name: Deanna Scott DOB: April 29, 1971 MRN: [id number]   Date of Service  12/08/2023  HPI/Events of Note  Ventilated in the setting of V-fib arrest and syncope.  eICU Interventions  Renew restraints for patient's safety.   2226 -patient previously on stress dose steroids in the setting of multi pressor shock complicated by steroid-induced hyperglycemia.  Now off of hydrocortisone/methylprednisolone.  Remains on norepinephrine, vasopressin, and milrinone.  Endo tool advising transition off insulin drip to sliding scale insulin.  Glucoses well-controlled, bicarb and gap appropriate.  Transition to subcu insulin every 4 hours.  Intervention Category Minor Interventions: Agitation / anxiety - evaluation and management  Madelin Weseman 12/08/2023, 9:43 PM

## 2023-12-09 ENCOUNTER — Inpatient Hospital Stay (HOSPITAL_COMMUNITY): Payer: No Typology Code available for payment source

## 2023-12-09 DIAGNOSIS — J9601 Acute respiratory failure with hypoxia: Secondary | ICD-10-CM | POA: Diagnosis not present

## 2023-12-09 DIAGNOSIS — I469 Cardiac arrest, cause unspecified: Secondary | ICD-10-CM | POA: Diagnosis not present

## 2023-12-09 DIAGNOSIS — J9602 Acute respiratory failure with hypercapnia: Secondary | ICD-10-CM | POA: Diagnosis not present

## 2023-12-09 LAB — GLUCOSE, CAPILLARY
Glucose-Capillary: 270 mg/dL — ABNORMAL HIGH (ref 70–99)
Glucose-Capillary: 272 mg/dL — ABNORMAL HIGH (ref 70–99)
Glucose-Capillary: 283 mg/dL — ABNORMAL HIGH (ref 70–99)
Glucose-Capillary: 200 mg/dL — ABNORMAL HIGH (ref 70–99)
Glucose-Capillary: 183 mg/dL — ABNORMAL HIGH (ref 70–99)

## 2023-12-09 LAB — MAGNESIUM
Magnesium: 2.2 mg/dL (ref 1.7–2.4)
Magnesium: 2.2 mg/dL (ref 1.7–2.4)

## 2023-12-09 LAB — COOXEMETRY PANEL
Carboxyhemoglobin: 2.1 % — ABNORMAL HIGH (ref 0.5–1.5)
Methemoglobin: <0.7 % (ref 0.0–1.5)
O2 Saturation: 78 %
Total hemoglobin: 9 g/dL — ABNORMAL LOW (ref 12.0–16.0)

## 2023-12-09 LAB — CBC
HCT: 26.4 % — ABNORMAL LOW (ref 36.0–46.0)
Hemoglobin: 8.6 g/dL — ABNORMAL LOW (ref 12.0–15.0)
MCH: 30.4 pg (ref 26.0–34.0)
MCHC: 32.6 g/dL (ref 30.0–36.0)
MCV: 93.3 fL (ref 80.0–100.0)
Platelets: UNDETERMINED 10*3/uL (ref 150–400)
RBC: 2.83 MIL/uL — ABNORMAL LOW (ref 3.87–5.11)
RDW: 14.7 % (ref 11.5–15.5)
WBC: 25.3 10*3/uL — ABNORMAL HIGH (ref 4.0–10.5)
nRBC: 0.1 % (ref 0.0–0.2)

## 2023-12-09 LAB — PHOSPHORUS: Phosphorus: 2.9 mg/dL (ref 2.5–4.6)

## 2023-12-09 LAB — BASIC METABOLIC PANEL
Anion gap: 7 (ref 5–15)
Anion gap: 8 (ref 5–15)
BUN: 39 mg/dL — ABNORMAL HIGH (ref 6–20)
BUN: 37 mg/dL — ABNORMAL HIGH (ref 6–20)
CO2: 24 mmol/L (ref 22–32)
CO2: 30 mmol/L (ref 22–32)
Calcium: 7.5 mg/dL — ABNORMAL LOW (ref 8.9–10.3)
Calcium: 7.9 mg/dL — ABNORMAL LOW (ref 8.9–10.3)
Chloride: 111 mmol/L (ref 98–111)
Chloride: 105 mmol/L (ref 98–111)
Creatinine, Ser: 1.08 mg/dL — ABNORMAL HIGH (ref 0.44–1.00)
Creatinine, Ser: 0.87 mg/dL (ref 0.44–1.00)
GFR, Estimated: >60 mL/min (ref >60)
GFR, Estimated: >60 mL/min (ref >60)
Glucose, Bld: 289 mg/dL — ABNORMAL HIGH (ref 70–99)
Glucose, Bld: 187 mg/dL — ABNORMAL HIGH (ref 70–99)
Potassium: 4.2 mmol/L (ref 3.5–5.1)
Potassium: 3.9 mmol/L (ref 3.5–5.1)
Sodium: 142 mmol/L (ref 135–145)
Sodium: 143 mmol/L (ref 135–145)

## 2023-12-09 LAB — URINE CULTURE: Culture: >=100000 — AB

## 2023-12-09 LAB — HEPATIC FUNCTION PANEL
ALT: 58 U/L — ABNORMAL HIGH (ref 0–44)
AST: 53 U/L — ABNORMAL HIGH (ref 15–41)
Albumin: 2.2 g/dL — ABNORMAL LOW (ref 3.5–5.0)
Alkaline Phosphatase: 59 U/L (ref 38–126)
Bilirubin, Direct: <0.1 mg/dL (ref 0.0–0.2)
Total Bilirubin: 0.7 mg/dL (ref 0.0–1.2)
Total Protein: 4.7 g/dL — ABNORMAL LOW (ref 6.5–8.1)

## 2023-12-09 LAB — LIDOCAINE LEVEL: Lidocaine Lvl: 2.5 ug/mL (ref 1.5–5.0)

## 2023-12-09 MED ORDER — ORAL CARE MOUTH RINSE
15.0000 mL | OROMUCOSAL | Status: DC
  Administered 2023-12-09 – 2023-12-16 (×23): 15 mL via OROMUCOSAL

## 2023-12-09 MED ORDER — METHYLPREDNISOLONE SODIUM SUCC 40 MG IJ SOLR
40.0000 mg | Freq: Every day | INTRAMUSCULAR | Status: DC
  Administered 2023-12-09 – 2023-12-12 (×4): 40 mg via INTRAVENOUS
  Filled 2023-12-09 (×4): qty 1

## 2023-12-09 MED ORDER — FUROSEMIDE 10 MG/ML IJ SOLN
60.0000 mg | Freq: Once | INTRAMUSCULAR | Status: AC
  Administered 2023-12-09: 60 mg via INTRAVENOUS
  Filled 2023-12-09: qty 6

## 2023-12-09 MED ORDER — ORAL CARE MOUTH RINSE: 15.0000 mL | OROMUCOSAL | Status: DC | PRN

## 2023-12-09 MED ORDER — INSULIN GLARGINE-YFGN 100 UNIT/ML ~~LOC~~ SOLN
10.0000 [IU] | Freq: Two times a day (BID) | SUBCUTANEOUS | Status: DC
  Administered 2023-12-09 (×2): 10 [IU] via SUBCUTANEOUS
  Filled 2023-12-09 (×4): qty 0.1

## 2023-12-09 NOTE — Progress Notes (Signed)
Patient Name: Deanna Scott Date of Encounter: 12/09/2023 [facility] Cardiologist: Chilton Si, MD   Interval Summary  .    Extubated today. Patient is thirsty, weak, sleepy.  Vital Signs .    Vitals:   12/09/23 1145 12/09/23 1200 12/09/23 1215 12/09/23 1300  BP:  110/76  (!) 99/58  Pulse: (!) 106 (!) 113 (!) 104 (!) 101  Resp: 19 18 19 18   Temp:  97.9 F (36.6 C)    TempSrc:      SpO2: 94% 95% 93% 94%  Weight:      Height:        Intake/Output Summary (Last 24 hours) at 12/09/2023 1343 Last data filed at 12/09/2023 1100 Gross per 24 hour  Intake 2662.65 ml  Output 3450 ml  Net -787.35 ml      12/09/2023    5:31 AM 12/08/2023    5:00 AM 12/07/2023    1:38 PM  Last 3 Weights  Weight (lbs) 232 lb 5.8 oz 223 lb 12.3 oz 222 lb 10.6 oz  Weight (kg) 105.4 kg 101.5 kg 101 kg      Telemetry/ECG    Sinus rhythm/tach - Personally Reviewed  Physical Exam .   GEN: No acute distress.   Neck: No JVD Cardiac: Normal rate, regular. Respiratory: Clear to auscultation bilaterally. GI: Soft, nontender, non-distended  MS: No edema  Assessment & Plan .     Assessment: Deanna Scott is a 53 y.o. female with a history of HTN, HLD, CSF leak s/p repair, tobacco use, THC abuse who initially presented to the ED on 12/05/2023 after a syncopal episode associated with bowel and bladder incontinence.  At approximately noon on 1/30, the patient had a cardiac arrest.  In the hour preceding her arrest it appears that she had progressively slowing sinus rhythm ultimately with sinus bradycardia and frequent ectopy.  It appears that one of the short couple PVCs ultimately triggered a polymorphic VT arrest.  It is unclear caused her precipitating bradycardia.  I suspect this was secondary to something else.  Of note, her potassium at the time of cardiac arrest was found to be 2.2 via i-STAT labs and her magnesium was 1.0.  If this is can be believed then profound hypokalemia could have  caused the bradycardia, ectopy and ventricular arrhythmia.  Her electrolytes continued to fluctuate wildly throughout that 24-hour period were also associated with profound hyperglycemia.  Her LV ejection fraction was found to be mildly decreased post arrest.  The patient remains intubated and additional history is likely needed to help elucidate etiology of her arrest.  If her arrhythmia can be attributed to a reversible cause such as electrolyte derangements.  Then ICD may not be necessarily warranted.  However, if no reversible etiology can be found the patient would need secondary prevention ICD prior to discharge.   Problem List:  PMVT/VF cardiac arrest Hypokalemia Hypomagnesemia Hypocalcemia  Acute systolic heart failure   Plan:  -Spoke with patient's daughter. Still unable to gather any history from patient. It seems patient had been feeling unwell leading up to her syncopal event at home. And was feeling poorly the morning of her cardiac arrest. Unclear what exactly what was causing patient's symptoms.  -Temporary pacing wire has been removed. Planning for cardiac MRI.  -Discontinue lidocaine and monitor. If frequent PVCs then resume.  -Aggressively monitor and replete electrolytes.  For questions or updates, please contact [facility] Please consult [email or web address] for contact info under  Signed, Nobie Putnam, MD

## 2023-12-09 NOTE — Plan of Care (Signed)
  Problem: Education: Goal: Knowledge of General Education information will improve Description: Including pain rating scale, medication(s)/side effects and non-pharmacologic comfort measures Outcome: Progressing   Problem: Skin Integrity: Goal: Risk for impaired skin integrity will decrease Outcome: Progressing   Problem: Education: Goal: Understanding of CV disease, CV risk reduction, and recovery process will improve Outcome: Progressing

## 2023-12-09 NOTE — Plan of Care (Signed)
  Problem: Education: Goal: Knowledge of General Education information will improve Description: Including pain rating scale, medication(s)/side effects and non-pharmacologic comfort measures Outcome: Progressing   Problem: Coping: Goal: Level of anxiety will decrease Outcome: Progressing   Problem: Pain Managment: Goal: General experience of comfort will improve and/or be controlled Outcome: Progressing

## 2023-12-09 NOTE — Progress Notes (Addendum)
Advanced Heart Failure Rounding Note  Cardiologist: Chilton Si, MD   Chief Complaint: VF arrest  Subjective:     Swan and temporary pacing wire removed today, patient extubated to room air.   Doing well, though more febrile this morning. Remains on abx. E. Coli in urine.      Objective:   Weight Range: 105.4 kg Body mass index is 42.5 kg/m.   Vital Signs:   Temp:  [97.9 F (36.6 C)-101.1 F (38.4 C)] 97.9 F (36.6 C) (02/02 1200) Pulse Rate:  [77-122] 104 (02/02 1215) Resp:  [12-26] 19 (02/02 1215) BP: (105-114)/(65-76) 110/76 (02/02 1200) SpO2:  [92 %-100 %] 93 % (02/02 1215) Arterial Line BP: (91-157)/(28-98) 97/65 (02/02 1215) FiO2 (%):  [40 %-44 %] 44 % (02/02 1121) Weight:  [105.4 kg] 105.4 kg (02/02 0531) Last BM Date :  (pta)  Weight change: Filed Weights   12/07/23 1338 12/08/23 0500 12/09/23 0531  Weight: 101 kg 101.5 kg 105.4 kg    Intake/Output:   Intake/Output Summary (Last 24 hours) at 12/09/2023 1255 Last data filed at 12/09/2023 1100 Gross per 24 hour  Intake 2662.65 ml  Output 3450 ml  Net -787.35 ml      Physical Exam    General: ill appearing Neck: R internal jugular temp pacer, L internal jugular Swan, removing today Cor: Tachycardic, No rubs, gallops or murmurs. Lungs: Diminished breath sounds Abdomen: Obese, soft, nondistended Extremities: LUE swelling from IV infiltrate, stable. .   Telemetry   Sinus tachycardia  Labs    CBC Recent Labs    12/07/23 1634 12/08/23 0411 12/09/23 0411  WBC 38.1* 33.7* 25.3*  NEUTROABS 31.8*  --   --   HGB 12.3 10.6* 8.6*  HCT 36.5 31.8* 26.4*  MCV 89.9 91.1 93.3  PLT 203 160 PLATELET CLUMPS NOTED ON SMEAR, UNABLE TO ESTIMATE   Basic Metabolic Panel Recent Labs    16/10/96 1723 12/09/23 0411  NA 143 142  K 3.5 4.2  CL 112* 111  CO2 23 24  GLUCOSE 211* 289*  BUN 36* 39*  CREATININE 1.09* 1.08*  CALCIUM 7.5* 7.5*  MG 2.1 2.2  PHOS 3.8 2.9   Liver Function  Tests Recent Labs    12/06/23 1734 12/09/23 0411  AST 348* 53*  ALT 149* 58*  ALKPHOS 89 59  BILITOT 0.9 0.7  PROT 5.2* 4.7*  ALBUMIN 2.6* 2.2*   No results for input(s): "LIPASE", "AMYLASE" in the last 72 hours. Cardiac Enzymes No results for input(s): "CKTOTAL", "CKMB", "CKMBINDEX", "TROPONINI" in the last 72 hours.  BNP: BNP (last 3 results) No results for input(s): "BNP" in the last 8760 hours.  ProBNP (last 3 results) No results for input(s): "PROBNP" in the last 8760 hours.   D-Dimer No results for input(s): "DDIMER" in the last 72 hours. Hemoglobin A1C No results for input(s): "HGBA1C" in the last 72 hours. Fasting Lipid Panel No results for input(s): "CHOL", "HDL", "LDLCALC", "TRIG", "CHOLHDL", "LDLDIRECT" in the last 72 hours. Thyroid Function Tests No results for input(s): "TSH", "T4TOTAL", "T3FREE", "THYROIDAB" in the last 72 hours.  Invalid input(s): "FREET3"   Other results:     Medications:     Scheduled Medications:  atorvastatin  80 mg Per Tube Daily   Chlorhexidine Gluconate Cloth  6 each Topical Daily   docusate  100 mg Per Tube BID   feeding supplement (PROSource TF20)  60 mL Per Tube Daily   heparin injection (subcutaneous)  5,000 Units Subcutaneous Q8H  insulin aspart  0-15 Units Subcutaneous Q4H   insulin glargine-yfgn  10 Units Subcutaneous BID   ipratropium-albuterol  3 mL Nebulization Q4H   methylPREDNISolone (SOLU-MEDROL) injection  40 mg Intravenous Daily   multivitamin with minerals  1 tablet Per Tube Daily   mouth rinse  15 mL Mouth Rinse Q2H   pantoprazole (PROTONIX) IV  40 mg Intravenous Q12H   polyethylene glycol  17 g Per Tube Daily   senna  1 tablet Per Tube Daily    Infusions:  sodium chloride 10 mL/hr at 12/09/23 0700   feeding supplement (VITAL AF 1.2 CAL) 60 mL/hr at 12/09/23 0700   fentaNYL infusion INTRAVENOUS 150 mcg/hr (12/09/23 0700)   lidocaine 1 mg/min (12/09/23 0700)   magnesium sulfate bolus IVPB      piperacillin-tazobactam (ZOSYN)  IV 12.5 mL/hr at 12/09/23 0700    PRN Medications: sodium chloride, acetaminophen (TYLENOL) oral liquid 160 mg/5 mL, docusate, fentaNYL, mouth rinse, polyethylene glycol, prochlorperazine    Patient Profile  53 y.o. female with history of chronic chest pain (prior cath with clean cors 2023), tobacco use, GERD, obesity, urinary incontinence s/p bladder sling, repaired CSF leak.   Admitted 12/05/23 after syncopal episode at home. On 01/30 had resuscitated VF arrest followed by torsades/recurrent VF d/t R on T phenomenon.   Assessment/Plan  Recurrent VF arrest: -01/30 had resuscitated VF arrest followed by torsades/recurrent VF d/t R on T phenomenon. Temp pacer placed in cath lab. No recurrent ventricular arrhythmias overnight. Remains on lidocaine gtt. Off amiodarone. -Cardiac cath with no significant CAD or evidence of SCAD -Echo 01/30: EF 40-45%, septal AK, no aortic dissection -Stopping steroids given infection, normal CRP and ESR on admit -Hopeful CMR Monday -EP consult -Suspect may be electrolyte induced but will need MRI -Some concern for coronary vasospasm given EKG change/troponin. Myocarditis also a consideration.  - Suspect she will require secondary prevention ICD if reversible cause not identified - Hopefully can turn off lidocaine tomorrow, level obtained and reviewed with pharmacy   2. Acute systolic CHF with prominant RV failure >> cardiogenic shock: resolved -CO/CI normalized, swan removed -1x dose of IV lasix 60mg  today, reassess tomorrow   3. Acute respiratory failure with hypercarbia and hypoxia/UTI/leukocytosis: Improved -On zosyn, E. Coli in the urine, suscepitibilities pending - Off steroids as above   4. Hypokalemia Hypomagnesemia Hypocalcemia -Lytes supplemented aggressively   Length of Stay: 3  Romie Minus, MD  12/09/2023, 12:55 PM  Advanced Heart Failure Team Pager [phone] (M-F; 7a - 5p)  Please contact [facility] for night-coverage after hours (5p -7a ) and weekends on [email or web address]  CRITICAL CARE Performed by: Romie Minus   Total critical care time: 40 minutes  Critical care time was exclusive of separately billable procedures and treating other patients.  Critical care was necessary to treat or prevent imminent or life-threatening deterioration.  Critical care was time spent personally by me on the following activities: development of treatment plan with patient and/or surrogate as well as nursing, discussions with consultants, evaluation of patient's response to treatment, examination of patient, obtaining history from patient or surrogate, ordering and performing treatments and interventions, ordering and review of laboratory studies, ordering and review of radiographic studies, pulse oximetry and re-evaluation of patient's condition.

## 2023-12-09 NOTE — Procedures (Signed)
Extubation Procedure Note  Patient Details:   Name: Deanna Scott DOB: 03/21/1971 MRN: [id number]   Airway Documentation:    Vent end date: 12/09/23 Vent end time: 0900   Evaluation  O2 sats: stable throughout Complications: No apparent complications Patient did tolerate procedure well. Bilateral Breath Sounds: Expiratory wheezes, Inspiratory wheezes   Yes, pt could speak post extubation.  Pt extubated to 6 l/m [place] without difficulty.  Positive cuff leak noted prior to extubation.  Audrie Lia 12/09/2023, 9:01 AM

## 2023-12-09 NOTE — Progress Notes (Signed)
NAME:  Deanna Scott, MRN:  [id number], DOB:  1971/06/15, LOS: 3 ADMISSION DATE:  12/05/2023, CONSULTATION DATE:  12/06/23 REFERRING MD:  FPTS , CHIEF COMPLAINT:  CPR in progress    History of Present Illness:   53 yo F PMH HTN, HLD, CSF leak sp repair, tobacco use, migraines who was admitted to [facility] 1/29 after presenting with reported AMS, though as additional information has been obtained this sounds like it was related to a syncopal event. Admitting though possible c/w sz, so an MRI brain was obtained which was unrevealing  On 1/29 night into 1/30 the pt was having intermittent chest pain and DOE. She had a marginally elevated hs trop 1/29 evening at 161, remained in the 100s into 1/30. An abnormal rhythm was noted by ED nursing 1/30 around 0700. . Later started on hep gtt . Multiple EKGs obtained.  Cardiology was consulted.   Shortly after, patient c/o chest pain, became bradycardic and arrested. PCCM consulted while compressions were in progress to weigh in on if TPA / tnkase should be considered.   Both PCCM and cardiology arrived to bedside essentially at the same time.  ROSC was achieved on our arrival. There is not a great timeline of arrest duration, but sounds like 15 min - 20 min. Did not get RSI meds during peri-arrest intubation.   Is having some movements following ROSC.    EKGs reviewed 631- abnormal T waves, abnormal R wave progression   801- repolarization abnormality c/f anterolateral ischemia  1202- brady, RBBB concern for acute lateral MI   Tele review w some episodes of CHB, maybe episode of VF   Sounds like she arrested shortly after 1202 EKG  Istat labs w K 2.2 iCal 0.6 hgb 7  CBG 66, got 2 amp   Pertinent  Medical History  HTN HLD Repaired CSF leak   Significant Hospital Events: Including procedures, antibiotic start and stop dates in addition to other pertinent events   1/29 syncope chest pain OOH. AdmitFPTS to obv. MRI neg. Progressive chest pain 1/29-30  night. Mild trop bump 1/30 ongoing CP. EKG changes. Bradycardic and arrested. Cards consulted right before arrest. PCCM consulted during code.  Cqath with clean cors. Later in day torsades. Return to CL for TVP  1/31 ongoing lyte replacement. Adding milrinone    Interim History / Subjective:  Patient continued to spike fever, with Tmax 101.1  White count is trending down  She is off vasopressor support and milrinone Lactate has trended down She is tolerating spontaneous breathing trial, will try to extubate her  Objective   Blood pressure (!) 114/58, pulse (!) 101, temperature (!) 100.6 F (38.1 C), resp. rate 13, height 5\' 2"  (1.575 m), weight 105.4 kg, last menstrual period 03/02/2012, SpO2 97%. PAP: (17-31)/(5-28) 30/28 CVP:  [3 mmHg-10 mmHg] 8 mmHg CO:  [7.5 L/min-9.1 L/min] 7.5 L/min CI:  [3.83 L/min/m2-4.7 L/min/m2] 3.83 L/min/m2  Vent Mode: PSV;CPAP FiO2 (%):  [40 %] 40 % Set Rate:  [24 bmp] 24 bmp Vt Set:  [440 mL] 440 mL PEEP:  [8 cmH20] 8 cmH20 Pressure Support:  [5 cmH20] 5 cmH20 Plateau Pressure:  [19 cmH20-20 cmH20] 20 cmH20   Intake/Output Summary (Last 24 hours) at 12/09/2023 0810 Last data filed at 12/09/2023 0700 Gross per 24 hour  Intake 3374.35 ml  Output 2475 ml  Net 899.35 ml   Filed Weights   12/07/23 1338 12/08/23 0500 12/09/23 0531  Weight: 101 kg 101.5 kg 105.4 kg    Examination:  General: Crtitically ill-appearing morbidly obese female, orally intubated HEENT: [place]/AT, eyes anicteric.  ETT and OGT in place Neuro: Awake, disconjugate gaze, intermittently following commands Chest: Bilateral faint expiratory wheezes, no rhonchi Heart: Regular rate and rhythm, no murmurs or gallops Abdomen: Soft, nondistended, bowel sounds present Skin: No rash  Urine culture growing E. coli, sensitivities pending  WBC 25k Coox 78% Phos 2.9 Lactic 1.6  Resolved Hospital Problem list     Assessment & Plan:  Status post V-fib cardiac arrest, likely in the setting  of hypokalemia/hypomagnesemia Acute HFrEF with cardiogenic shock Demand cardiac ischemia 1st arrest 1/30. Return to CL 1/30 after VF/Torsades arrest for TVP.  LVEF is 40 to 45% with akinesis/hypokinesis of septal wall  Advanced heart failure team is following Patient is off vasopressor support Milrinone was discontinued Still on lidocaine at 1 mg Avoid amiodarone considering Toresade  Patient will need cardiac MRI once clinically stable Will give her Lasix 60 mg x 1 today Ischemic workup is negative  Acute respiratory failure with hypercarbia and hypoxia Aspiration pneumonia Currently on 50% FiO2 and PEEP at 10 Continue lung protective ventilation VAP prevention bundle in place Versed was stopped, remained on low-dose fentanyl Continue to spike fever though white count is coming down and lactate has cleared Continue Zosyn for now She has some wheezes, will add Solu-Medrol 40 mg daily for 5 days Will repeat x-ray chest today  Hypokalemia Hypomagnesemia Hypocalcemia Hypophosphatemia Electrolytes are corrected closely monitor and supplement as needed  AKI Metabolic acidosis Serum creatinine continue to trend down Acidosis has resolved Monitor intake and output Avoid nephrotoxic agent  DM2 w steroid induced hyperglycemia Insulin infusion was transitioned off Blood sugars are not well-controlled, increase Lantus to 10 units twice daily Continue sliding scale insulin with CBG goal 140-180  Acute urinary tract infection with E. coli Urine culture grew E. coli, sensitivities are pending Continue Zosyn for now though she is spiking fever, based on sensitivities we might need to change antibiotics  IV extravasation Received phentolamine   Morbid obesity Diet and exercise counseling as appropriate  Best Practice (right click and "Reselect all SmartList Selections" daily)   Diet/type: Tube feeds DVT prophylaxis prophylactic heparin  Pressure ulcer(s): pressure ulcer  assessment deferred  GI prophylaxis: PPI Lines: Central line, Arterial Line, and yes and it is still needed Foley:  Yes, and it is still needed Code Status:  full code Last date of multidisciplinary goals of care discussion [family updated 2/1 ]  Labs   CBC: Recent Labs  Lab 12/05/23 1003 12/05/23 1140 12/06/23 1850 12/06/23 2003 12/07/23 0306 12/07/23 1014 12/07/23 1016 12/07/23 1634 12/08/23 0411 12/09/23 0411  WBC 8.3   < > 45.0*  --  32.5*  --   --  38.1* 33.7* 25.3*  NEUTROABS 6.0  --   --   --   --   --   --  31.8*  --   --   HGB 14.2   < > 13.2   < > 13.7 12.6 12.9 12.3 10.6* 8.6*  HCT 43.3   < > 40.5   < > 42.3 37.0 39.4 36.5 31.8* 26.4*  MCV 90.8   < > 92.3  --  92.6  --   --  89.9 91.1 93.3  PLT 261   < > 315  --  263  --   --  203 160 PLATELET CLUMPS NOTED ON SMEAR, UNABLE TO ESTIMATE   < > = values in this interval not displayed.  Basic Metabolic Panel: Recent Labs  Lab 12/07/23 0306 12/07/23 0948 12/07/23 1228 12/07/23 1634 12/08/23 0411 12/08/23 1723 12/09/23 0411  NA 142   < > 145 145 143 143 142  K 2.9*   < > 4.0 4.2 4.0 3.5 4.2  CL 112*   < > 113* 113* 112* 112* 111  CO2 13*   < > 22 21* 22 23 24   GLUCOSE 324*   < > 178* 176* 194* 211* 289*  BUN 19   < > 21* 23* 29* 36* 39*  CREATININE 1.41*   < > 1.25* 1.12* 1.11* 1.09* 1.08*  CALCIUM 8.0*   < > 7.9* 7.9* 7.9* 7.5* 7.5*  MG 2.9*  --   --  2.2 2.3 2.1 2.2  PHOS  --   --  2.4* 2.5 1.8* 3.8 2.9   < > = values in this interval not displayed.   GFR: Estimated Creatinine Clearance: 67.9 mL/min (A) (by C-G formula based on SCr of 1.08 mg/dL (H)). Recent Labs  Lab 12/07/23 0306 12/07/23 0601 12/07/23 0850 12/07/23 1019 12/07/23 1310 12/07/23 1634 12/08/23 0411 12/08/23 1201 12/09/23 0411  WBC 32.5*  --   --   --   --  38.1* 33.7*  --  25.3*  LATICACIDVEN  --    < > 4.4* 3.0* 2.8*  --   --  1.1  --    < > = values in this interval not displayed.    Liver Function Tests: Recent Labs   Lab 12/05/23 1003 12/06/23 0402 12/06/23 1734 12/09/23 0411  AST 77* 32 348* 53*  ALT 38 24 149* 58*  ALKPHOS 111 82 89 59  BILITOT 0.8 0.9 0.9 0.7  PROT 7.1 6.4* 5.2* 4.7*  ALBUMIN 3.8 3.4* 2.6* 2.2*   No results for input(s): "LIPASE", "AMYLASE" in the last 168 hours. Recent Labs  Lab 12/05/23 1217  AMMONIA 19    ABG    Component Value Date/Time   PHART 7.270 (L) 12/07/2023 1014   PCO2ART 40.7 12/07/2023 1014   PO2ART 158 (H) 12/07/2023 1014   HCO3 18.6 (L) 12/07/2023 1014   TCO2 20 (L) 12/07/2023 1014   ACIDBASEDEF 8.0 (H) 12/07/2023 1014   O2SAT 78 12/09/2023 0411     Coagulation Profile: Recent Labs  Lab 12/06/23 1734  INR 1.4*    Cardiac Enzymes: No results for input(s): "CKTOTAL", "CKMB", "CKMBINDEX", "TROPONINI" in the last 168 hours.  HbA1C: HbA1c, POC (prediabetic range)  Date/Time Value Ref Range Status  09/21/2023 03:50 PM 6.2 5.7 - 6.4 % Final  06/08/2022 03:07 PM 6.1 5.7 - 6.4 % Final   Hgb A1c MFr Bld  Date/Time Value Ref Range Status  10/19/2023 03:46 PM 6.6 (H) 4.8 - 5.6 % Final    Comment:             Prediabetes: 5.7 - 6.4          Diabetes: >6.4          Glycemic control for adults with diabetes: <7.0     CBG: Recent Labs  Lab 12/08/23 2112 12/08/23 2205 12/08/23 2321 12/09/23 0408 12/09/23 0747  GLUCAP 176* 160* 188* 270* 272*     The patient is critically ill due to acute HFrEF with cardiogenic shock/status postcardiac arrest.  Critical care was necessary to treat or prevent imminent or life-threatening deterioration.  Critical care was time spent personally by me on the following activities: development of treatment plan with patient and/or surrogate as well as nursing,  discussions with consultants, evaluation of patient's response to treatment, examination of patient, obtaining history from patient or surrogate, ordering and performing treatments and interventions, ordering and review of laboratory studies, ordering and  review of radiographic studies, pulse oximetry, re-evaluation of patient's condition and participation in multidisciplinary rounds.   During this encounter critical care time was devoted to patient care services described in this note for 35 minutes.     Cheri Fowler, MD [facility] See Amion for pager If no response to pager, please call [phone] until 7pm After 7pm, Please call E-link [phone]

## 2023-12-10 ENCOUNTER — Encounter (HOSPITAL_COMMUNITY): Payer: Self-pay

## 2023-12-10 ENCOUNTER — Encounter (HOSPITAL_COMMUNITY): Payer: Self-pay | Admitting: Internal Medicine

## 2023-12-10 DIAGNOSIS — N3000 Acute cystitis without hematuria: Secondary | ICD-10-CM | POA: Diagnosis not present

## 2023-12-10 DIAGNOSIS — J9601 Acute respiratory failure with hypoxia: Secondary | ICD-10-CM | POA: Diagnosis not present

## 2023-12-10 DIAGNOSIS — A419 Sepsis, unspecified organism: Secondary | ICD-10-CM

## 2023-12-10 DIAGNOSIS — E876 Hypokalemia: Secondary | ICD-10-CM

## 2023-12-10 DIAGNOSIS — I5021 Acute systolic (congestive) heart failure: Secondary | ICD-10-CM | POA: Diagnosis not present

## 2023-12-10 DIAGNOSIS — G9340 Encephalopathy, unspecified: Secondary | ICD-10-CM

## 2023-12-10 LAB — BASIC METABOLIC PANEL WITH GFR
Anion gap: 6 (ref 5–15)
BUN: 33 mg/dL — ABNORMAL HIGH (ref 6–20)
CO2: 29 mmol/L (ref 22–32)
Calcium: 8.1 mg/dL — ABNORMAL LOW (ref 8.9–10.3)
Chloride: 107 mmol/L (ref 98–111)
Creatinine, Ser: 0.73 mg/dL (ref 0.44–1.00)
GFR, Estimated: >60 mL/min
Glucose, Bld: 134 mg/dL — ABNORMAL HIGH (ref 70–99)
Potassium: 4.7 mmol/L (ref 3.5–5.1)
Sodium: 142 mmol/L (ref 135–145)

## 2023-12-10 LAB — CULTURE, BLOOD (ROUTINE X 2)
Culture: NO GROWTH
Culture: NO GROWTH
Special Requests: ADEQUATE
Special Requests: ADEQUATE

## 2023-12-10 LAB — CULTURE, RESPIRATORY W GRAM STAIN: Culture: NORMAL

## 2023-12-10 LAB — GLUCOSE, CAPILLARY
Glucose-Capillary: 114 mg/dL — ABNORMAL HIGH (ref 70–99)
Glucose-Capillary: 115 mg/dL — ABNORMAL HIGH (ref 70–99)
Glucose-Capillary: 131 mg/dL — ABNORMAL HIGH (ref 70–99)
Glucose-Capillary: 152 mg/dL — ABNORMAL HIGH (ref 70–99)
Glucose-Capillary: 164 mg/dL — ABNORMAL HIGH (ref 70–99)
Glucose-Capillary: 207 mg/dL — ABNORMAL HIGH (ref 70–99)
Glucose-Capillary: 95 mg/dL (ref 70–99)

## 2023-12-10 LAB — CBC
HCT: 27.1 % — ABNORMAL LOW (ref 36.0–46.0)
Hemoglobin: 8.9 g/dL — ABNORMAL LOW (ref 12.0–15.0)
MCH: 30.8 pg (ref 26.0–34.0)
MCHC: 32.8 g/dL (ref 30.0–36.0)
MCV: 93.8 fL (ref 80.0–100.0)
Platelets: 88 10*3/uL — ABNORMAL LOW (ref 150–400)
RBC: 2.89 MIL/uL — ABNORMAL LOW (ref 3.87–5.11)
RDW: 14.8 % (ref 11.5–15.5)
WBC: 23.4 10*3/uL — ABNORMAL HIGH (ref 4.0–10.5)
nRBC: 0.2 % (ref 0.0–0.2)

## 2023-12-10 LAB — BASIC METABOLIC PANEL
Anion gap: 9 (ref 5–15)
BUN: 29 mg/dL — ABNORMAL HIGH (ref 6–20)
CO2: 27 mmol/L (ref 22–32)
Calcium: 8.5 mg/dL — ABNORMAL LOW (ref 8.9–10.3)
Chloride: 104 mmol/L (ref 98–111)
Creatinine, Ser: 0.78 mg/dL (ref 0.44–1.00)
GFR, Estimated: >60 mL/min (ref >60)
Glucose, Bld: 230 mg/dL — ABNORMAL HIGH (ref 70–99)
Potassium: 4.6 mmol/L (ref 3.5–5.1)
Sodium: 140 mmol/L (ref 135–145)

## 2023-12-10 LAB — MAGNESIUM: Magnesium: 2.4 mg/dL (ref 1.7–2.4)

## 2023-12-10 LAB — COOXEMETRY PANEL
Carboxyhemoglobin: 2.6 % — ABNORMAL HIGH (ref 0.5–1.5)
Methemoglobin: <0.7 % (ref 0.0–1.5)
O2 Saturation: 72.7 %
Total hemoglobin: 8.7 g/dL — ABNORMAL LOW (ref 12.0–16.0)

## 2023-12-10 LAB — LIDOCAINE LEVEL
Lidocaine Lvl: 2.2 ug/mL (ref 1.5–5.0)
Lidocaine Lvl: 3.3 ug/mL (ref 1.5–5.0)

## 2023-12-10 MED ORDER — LORAZEPAM 2 MG/ML IJ SOLN
1.0000 mg | Freq: Once | INTRAMUSCULAR | Status: AC
  Administered 2023-12-11: 1 mg via INTRAVENOUS
  Filled 2023-12-10: qty 1

## 2023-12-10 MED ORDER — SENNA 8.6 MG PO TABS
1.0000 | ORAL_TABLET | Freq: Every day | ORAL | Status: DC
  Administered 2023-12-10 – 2023-12-17 (×3): 8.6 mg via ORAL
  Filled 2023-12-10 (×6): qty 1

## 2023-12-10 MED ORDER — PANTOPRAZOLE SODIUM 40 MG PO TBEC: 40.0000 mg | DELAYED_RELEASE_TABLET | Freq: Every day | ORAL | Status: DC

## 2023-12-10 MED ORDER — IPRATROPIUM-ALBUTEROL 0.5-2.5 (3) MG/3ML IN SOLN
3.0000 mL | Freq: Two times a day (BID) | RESPIRATORY_TRACT | Status: DC
  Administered 2023-12-11 – 2023-12-13 (×5): 3 mL via RESPIRATORY_TRACT
  Filled 2023-12-10 (×6): qty 3

## 2023-12-10 MED ORDER — ATORVASTATIN CALCIUM 80 MG PO TABS
80.0000 mg | ORAL_TABLET | Freq: Every day | ORAL | Status: DC
  Administered 2023-12-10 – 2023-12-17 (×8): 80 mg via ORAL
  Filled 2023-12-10 (×8): qty 1

## 2023-12-10 MED ORDER — PANTOPRAZOLE SODIUM 40 MG PO TBEC
40.0000 mg | DELAYED_RELEASE_TABLET | Freq: Every day | ORAL | Status: DC
Start: 2023-12-11 — End: 2023-12-17
  Administered 2023-12-11 – 2023-12-17 (×7): 40 mg via ORAL
  Filled 2023-12-10 (×7): qty 1

## 2023-12-10 MED ORDER — POLYETHYLENE GLYCOL 3350 17 G PO PACK: 17.0000 g | PACK | Freq: Every day | ORAL | Status: DC | PRN

## 2023-12-10 MED ORDER — DOCUSATE SODIUM 100 MG PO CAPS: 100.0000 mg | ORAL_CAPSULE | Freq: Two times a day (BID) | ORAL | Status: DC | PRN

## 2023-12-10 MED ORDER — ORAL CARE MOUTH RINSE: 15.0000 mL | OROMUCOSAL | Status: DC | PRN

## 2023-12-10 MED ORDER — ACETAMINOPHEN 325 MG PO TABS
650.0000 mg | ORAL_TABLET | ORAL | Status: DC | PRN
  Administered 2023-12-12 – 2023-12-17 (×15): 650 mg via ORAL
  Filled 2023-12-10 (×16): qty 2

## 2023-12-10 MED ORDER — PANTOPRAZOLE SODIUM 40 MG IV SOLR
40.0000 mg | INTRAVENOUS | Status: AC
  Administered 2023-12-10: 40 mg via INTRAVENOUS

## 2023-12-10 MED ORDER — PROCHLORPERAZINE MALEATE 5 MG PO TABS: 5.0000 mg | ORAL_TABLET | Freq: Four times a day (QID) | ORAL | Status: DC | PRN

## 2023-12-10 MED ORDER — ADULT MULTIVITAMIN W/MINERALS CH
1.0000 | ORAL_TABLET | Freq: Every day | ORAL | Status: DC
  Administered 2023-12-10 – 2023-12-17 (×8): 1 via ORAL
  Filled 2023-12-10 (×8): qty 1

## 2023-12-10 NOTE — Evaluation (Signed)
Physical Therapy Evaluation Patient Details Name: Deanna Scott MRN: [id number] DOB: 1971/03/25 Today's Date: 12/10/2023  History of Present Illness  Pt is a 53 yo female presenting with AMS. S/p collapse on 1/29, unknown LOC Found to be +THC. ON 1/30 pt with with chest pain and DOE, became bradycardic and arrested. Awaiting cardiac MRI. PMH: migraines, HTN, chronic dizziness (unclear cause), and CSF leak with endoscopic repair in 2021, urinary incontinence s/p suburethral sling placement   Clinical Impression  Pt admitted with above. 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 Pt lives with her mother however both of them can care for themselves. Pt presenting with edema t/o all extremities, generalized weakness requiring use of stedy to complete stand and transfer to [facility], delayed processing, impaired short term memory deficits, and decreased activity tolerance. Pt to benefit from inpatient rehab program > 3 hrs to achieve maximal functional return for safe transition home with mother.  Acute PT to cont to follow.       If plan is discharge home, recommend the following: Two people to help with walking and/or transfers;Two people to help with bathing/dressing/bathroom;Assistance with cooking/housework;Supervision due to cognitive status;Help with stairs or ramp for entrance;Assist for transportation   Can travel by private vehicle        Equipment Recommendations Rolling walker (2 wheels)  Recommendations for Other Services  Rehab consult    Functional Status Assessment Patient has had a recent decline in their functional status and demonstrates the ability to make significant improvements in function in a reasonable and predictable amount of time.     Precautions / Restrictions Precautions Precautions: Fall Restrictions Weight Bearing Restrictions Per Provider Order: No      Mobility  Bed Mobility Overal bed mobility: Needs Assistance Bed Mobility: Sit  to Supine       Sit to supine: Mod assist, +2 for physical assistance   General bed mobility comments: pt with poor command following vs decreased ability to perform asked task, delayed reponse time requiring tactile cues    Transfers Overall transfer level: Needs assistance Equipment used:  (stedy) Transfers: Sit to/from Stand, Bed to chair/wheelchair/BSC Sit to Stand: Mod assist, +2 physical assistance           General transfer comment: completed 3 sit to stands, modAx2 from [facility], minAx2 from stedy seat. pt with difficulty gripping of hands due to edema Transfer via Lift Equipment: Stedy  Ambulation/Gait               General Gait Details: attempted to have pt march in place in stedy however pt impulsively sat stating "I can't do it"  Stairs            Wheelchair Mobility     Tilt Bed    Modified Rankin (Stroke Patients Only)       Balance Overall balance assessment: Needs assistance Sitting-balance support: Feet supported, No upper extremity supported Sitting balance-Leahy Scale: Fair     Standing balance support: During functional activity, Bilateral upper extremity supported, Reliant on assistive device for balance Standing balance-Leahy Scale: Zero Standing balance comment: depedent on external support                             Pertinent Vitals/Pain Pain Assessment Pain Assessment: No/denies pain    Home Living Family/patient expects to be discharged to:: Private residence Living Arrangements: Parent Available Help at Discharge: Family;Available 24 hours/day Type of  Home: House Home Access: Stairs to enter Entrance Stairs-Rails: None Entrance Stairs-Number of Steps: 1   Home Layout: One level Home Equipment: Shower seat      Prior Function Prior Level of Function : Independent/Modified Independent;Working/employed;Driving             Mobility Comments: no AD, owns a cleaning company ADLs Comments: indep      Extremity/Trunk Assessment   Upper Extremity Assessment Upper Extremity Assessment: Generalized weakness (edema t/o bilat UEs, L UE worse than R UE)    Lower Extremity Assessment Lower Extremity Assessment: Generalized weakness (edema t/o bilat LEs)    Cervical / Trunk Assessment Cervical / Trunk Assessment: Normal  Communication   Communication Communication: No apparent difficulties  Cognition Arousal: Alert (but sleepy) Behavior During Therapy: WFL for tasks assessed/performed Overall Cognitive Status: Impaired/Different from baseline Area of Impairment: Orientation, Attention, Following commands, Safety/judgement, Awareness, Problem solving                 Orientation Level: Disoriented to, Time (stated january, but knew february came after january) Current Attention Level: Sustained   Following Commands: Follows one step commands with increased time Safety/Judgement: Decreased awareness of safety, Decreased awareness of deficits Awareness: Emergent Problem Solving: Slow processing, Decreased initiation, Difficulty sequencing, Requires verbal cues, Requires tactile cues General Comments: pt sleepy but alert, delayed response time and short term memory deficits        General Comments General comments (skin integrity, edema, etc.): edema t/o all four extremities, SpO2 at 90% on 3Lo2 via [place], assist with bath on BSC, dependent for hygiene s/p urination    Exercises     Assessment/Plan    PT Assessment Patient needs continued PT services  PT Problem List Decreased strength;Decreased activity tolerance;Decreased balance;Decreased mobility;Decreased coordination;Decreased cognition;Decreased knowledge of use of DME;Decreased safety awareness;Decreased knowledge of precautions       PT Treatment Interventions DME instruction;Gait training;Stair training;Functional mobility training;Therapeutic activities;Therapeutic exercise;Balance training;Neuromuscular  re-education    PT Goals (Current goals can be found in the Care Plan section)  Acute Rehab PT Goals Patient Stated Goal: get better PT Goal Formulation: With patient/family Time For Goal Achievement: 12/24/23 Potential to Achieve Goals: Good    Frequency Min 1X/week     Co-evaluation               AM-PAC PT "6 Clicks" Mobility  Outcome Measure Help needed turning from your back to your side while in a flat bed without using bedrails?: A Lot Help needed moving from lying on your back to sitting on the side of a flat bed without using bedrails?: A Lot Help needed moving to and from a bed to a chair (including a wheelchair)?: A Lot Help needed standing up from a chair using your arms (e.g., wheelchair or bedside chair)?: A Lot Help needed to walk in hospital room?: Total Help needed climbing 3-5 steps with a railing? : Total 6 Click Score: 10    End of Session Equipment Utilized During Treatment: Oxygen Activity Tolerance: Patient limited by fatigue Patient left: in bed;with call bell/phone within reach;with family/visitor present;with nursing/sitter in room Nurse Communication: Mobility status PT Visit Diagnosis: Unsteadiness on feet (R26.81);Muscle weakness (generalized) (M62.81)    Time: 4098-1191 PT Time Calculation (min) (ACUTE ONLY): 29 min   Charges:   PT Evaluation $PT Eval Moderate Complexity: 1 Mod PT Treatments $Therapeutic Activity: 8-22 mins PT General Charges $$ ACUTE PT VISIT: 1 Visit         Akeiba Axelson,  PT, DPT Acute Rehabilitation Services Secure chat preferred Office #: [phone]   Iona Hansen 12/10/2023, 1:25 PM

## 2023-12-10 NOTE — Progress Notes (Signed)
Right internal jugular sheath removed per Dr. Gasper Lloyd.

## 2023-12-10 NOTE — TOC Progression Note (Signed)
Transition of Care [facility]) - Progression Note    Patient Details  Name: Deanna Scott MRN: [id number] Date of Birth: October 01, 1971  Transition of [facility]) CM/SW Contact  Elliot Cousin, RN Phone Number: [phone] 12/10/2023, 5:59 PM  Clinical Narrative:    TOC CM spoke to pt's dtr, Deanna Scott. States pt was independent pta. Her and mother-in-law are roommates. Offered choice for IP rehab, CIR is out of network, states she wants to try [facility] to see if they are in network. (Medicare.gov list given to pt's dtr and placed on chart).  Will follow up with [facility] IP rehab [phone]) [phone] for possible bed.         Expected Discharge Plan and Services      Social Determinants of Health (SDOH) Interventions SDOH Screenings   Food Insecurity: Food Insecurity Present (01/10/2023)  Housing: Low Risk  (01/10/2023)  Transportation Needs: No Transportation Needs (01/10/2023)  Utilities: Not At Risk (01/10/2023)  Depression (PHQ2-9): Low Risk  (10/19/2023)  Financial Resource Strain: Medium Risk (01/10/2023)  Tobacco Use: Medium Risk (12/05/2023)    Readmission Risk Interventions     No data to display

## 2023-12-10 NOTE — Progress Notes (Signed)
NAME:  Deanna Scott, MRN:  [id number], DOB:  11/14/1970, LOS: 4 ADMISSION DATE:  12/05/2023, CONSULTATION DATE:  12/06/23 REFERRING MD:  FPTS , CHIEF COMPLAINT:  CPR in progress    History of Present Illness:   53 yo F PMH HTN, HLD, CSF leak sp repair, tobacco use, migraines who was admitted to [facility] 1/29 after presenting with reported AMS, though as additional information has been obtained this sounds like it was related to a syncopal event. Admitting though possible c/w sz, so an MRI brain was obtained which was unrevealing  On 1/29 night into 1/30 the pt was having intermittent chest pain and DOE. She had a marginally elevated hs trop 1/29 evening at 161, remained in the 100s into 1/30. An abnormal rhythm was noted by ED nursing 1/30 around 0700. . Later started on hep gtt . Multiple EKGs obtained.  Cardiology was consulted.   Shortly after, patient c/o chest pain, became bradycardic and arrested. PCCM consulted while compressions were in progress to weigh in on if TPA / tnkase should be considered.   Both PCCM and cardiology arrived to bedside essentially at the same time.  ROSC was achieved on our arrival. There is not a great timeline of arrest duration, but sounds like 15 min - 20 min. Did not get RSI meds during peri-arrest intubation.   Is having some movements following ROSC.    EKGs reviewed 631- abnormal T waves, abnormal R wave progression   801- repolarization abnormality c/f anterolateral ischemia  1202- brady, RBBB concern for acute lateral MI   Tele review w some episodes of CHB, maybe episode of VF   Sounds like she arrested shortly after 1202 EKG  Istat labs w K 2.2 iCal 0.6 hgb 7  CBG 66, got 2 amp   Pertinent  Medical History  HTN HLD Repaired CSF leak   Significant Hospital Events: Including procedures, antibiotic start and stop dates in addition to other pertinent events   1/29 syncope chest pain OOH. AdmitFPTS to obv. MRI neg. Progressive chest pain 1/29-30  night. Mild trop bump 1/30 ongoing CP. EKG changes. Bradycardic and arrested. Cards consulted right before arrest. PCCM consulted during code.  Cqath with clean cors. Later in day torsades. Return to CL for TVP  1/31 ongoing lyte replacement. Adding milrinone   2/2 extubated  2/3 cMRI   Interim History / Subjective:  Off milrinone and vaso   On lido at 1    Objective   Blood pressure 136/88, pulse 72, temperature 97.6 F (36.4 C), temperature source Oral, resp. rate 17, height 5\' 2"  (1.575 m), weight 99.1 kg, last menstrual period 03/02/2012, SpO2 95%.    FiO2 (%):  [36 %] 36 %   Intake/Output Summary (Last 24 hours) at 12/10/2023 1253 Last data filed at 12/10/2023 1200 Gross per 24 hour  Intake 1672.78 ml  Output 2605 ml  Net -932.22 ml   Filed Weights   12/08/23 0500 12/09/23 0531 12/10/23 0500  Weight: 101.5 kg 105.4 kg 99.1 kg    Examination: General: Ill appearing middle aged F NAD  HEENT: NCAT pink mm anicteric sclera  Neuro:  AAOx3  Chest: Symmetrical chest expansion, even and unlabored  Heart: rrr s1s2  Abdomen: soft round ndnt  Skin: c/d/w MSK: LUE is edematous   Resolved Hospital Problem list    AKI, metabolic acidosis  Assessment & Plan:   Vfib arrest, recurrent -possibly in setting of HypoK hypoMag. Acute HFrEF Cardiogenic shock Demand ischemia  1st arrest 1/30.  Return to CL 1/30 after VF/Torsades arrest for TVP.  LVEF is 40 to 45% with akinesis/hypokinesis of septal wall  P -off pressors -EP and Adv HF are following -dc lido -cardiac MRI 2/3 -- decisions re ICD pending this  -solumedrol weaned 2/2, 5d course  -statin -prior to discharge, she needs to have family bring in her herbal teas/supplements for review -- daughter thinks some might be diuretics   Acute respiratory failure w hypoxia, hypercarbia  Aspiration PNA  P -Pulm hygiene, IS, mobility -zosyn   Hypokalemia Hypomagnesemia Hypocalcemia Hypophosphatemia -follow lytes, replace  aggressively   DM2 w steroid induced hyperglycemia -SSI  UTI  -zosyn  IV extravasation LUE -Received phentolamine   Dispo/progression: -stable to transfer out of ICU 2/3.  -Will order PT/OT -adv Diet   Best Practice (right click and "Reselect all SmartList Selections" daily)   Diet/type: reg DVT prophylaxis prophylactic heparin  Pressure ulcer(s): pressure ulcer assessment deferred  GI prophylaxis: PPI Lines: Central line, Arterial Line, and yes and it is still needed Foley:  Yes, and it is still needed Code Status:  full code Last date of multidisciplinary goals of care discussion [family updated 1/30 ]  Labs   CBC: Recent Labs  Lab 12/05/23 1003 12/05/23 1140 12/07/23 0306 12/07/23 1014 12/07/23 1016 12/07/23 1634 12/08/23 0411 12/09/23 0411 12/10/23 0427  WBC 8.3   < > 32.5*  --   --  38.1* 33.7* 25.3* 23.4*  NEUTROABS 6.0  --   --   --   --  31.8*  --   --   --   HGB 14.2   < > 13.7   < > 12.9 12.3 10.6* 8.6* 8.9*  HCT 43.3   < > 42.3   < > 39.4 36.5 31.8* 26.4* 27.1*  MCV 90.8   < > 92.6  --   --  89.9 91.1 93.3 93.8  PLT 261   < > 263  --   --  203 160 PLATELET CLUMPS NOTED ON SMEAR, UNABLE TO ESTIMATE 88*   < > = values in this interval not displayed.    Basic Metabolic Panel: Recent Labs  Lab 12/07/23 1228 12/07/23 1634 12/08/23 0411 12/08/23 1723 12/09/23 0411 12/09/23 1706 12/10/23 0427  NA 145 145 143 143 142 143 142  K 4.0 4.2 4.0 3.5 4.2 3.9 4.7  CL 113* 113* 112* 112* 111 105 107  CO2 22 21* 22 23 24 30 29   GLUCOSE 178* 176* 194* 211* 289* 187* 134*  BUN 21* 23* 29* 36* 39* 37* 33*  CREATININE 1.25* 1.12* 1.11* 1.09* 1.08* 0.87 0.73  CALCIUM 7.9* 7.9* 7.9* 7.5* 7.5* 7.9* 8.1*  MG  --  2.2 2.3 2.1 2.2 2.2 2.4  PHOS 2.4* 2.5 1.8* 3.8 2.9  --   --    GFR: Estimated Creatinine Clearance: 88.5 mL/min (by C-G formula based on SCr of 0.73 mg/dL). Recent Labs  Lab 12/07/23 0850 12/07/23 1019 12/07/23 1310 12/07/23 1634 12/08/23 0411  12/08/23 1201 12/09/23 0411 12/10/23 0427  WBC  --   --   --  38.1* 33.7*  --  25.3* 23.4*  LATICACIDVEN 4.4* 3.0* 2.8*  --   --  1.1  --   --     Liver Function Tests: Recent Labs  Lab 12/05/23 1003 12/06/23 0402 12/06/23 1734 12/09/23 0411  AST 77* 32 348* 53*  ALT 38 24 149* 58*  ALKPHOS 111 82 89 59  BILITOT 0.8 0.9 0.9 0.7  PROT 7.1 6.4*  5.2* 4.7*  ALBUMIN 3.8 3.4* 2.6* 2.2*   No results for input(s): "LIPASE", "AMYLASE" in the last 168 hours. Recent Labs  Lab 12/05/23 1217  AMMONIA 19    ABG    Component Value Date/Time   PHART 7.270 (L) 12/07/2023 1014   PCO2ART 40.7 12/07/2023 1014   PO2ART 158 (H) 12/07/2023 1014   HCO3 18.6 (L) 12/07/2023 1014   TCO2 20 (L) 12/07/2023 1014   ACIDBASEDEF 8.0 (H) 12/07/2023 1014   O2SAT 72.7 12/10/2023 0427     Coagulation Profile: Recent Labs  Lab 12/06/23 1734  INR 1.4*    Cardiac Enzymes: No results for input(s): "CKTOTAL", "CKMB", "CKMBINDEX", "TROPONINI" in the last 168 hours.  HbA1C: HbA1c, POC (prediabetic range)  Date/Time Value Ref Range Status  09/21/2023 03:50 PM 6.2 5.7 - 6.4 % Final  06/08/2022 03:07 PM 6.1 5.7 - 6.4 % Final   Hgb A1c MFr Bld  Date/Time Value Ref Range Status  10/19/2023 03:46 PM 6.6 (H) 4.8 - 5.6 % Final    Comment:             Prediabetes: 5.7 - 6.4          Diabetes: >6.4          Glycemic control for adults with diabetes: <7.0     CBG: Recent Labs  Lab 12/09/23 1622 12/09/23 1953 12/09/23 2354 12/10/23 0320 12/10/23 0732  GLUCAP 200* 183* 164* 131* 95     High MDM   Tessie Fass MSN, AGACNP-BC [facility] Amion for pager  12/10/2023, 12:53 PM

## 2023-12-10 NOTE — Progress Notes (Addendum)
Advanced Heart Failure Rounding Note  Cardiologist: Chilton Si, MD   Chief Complaint: VF arrest   Patient Profile  53 y/o F admitted w/ VF arrest followed by torsades/recurrent VF d/t R on T phenomenon. Cath clean cors, no SCAD. EF 45%.   Subjective:    Extubated, neurologically recovered.    Remains on lidocaine gtt at 1 mg/min. Level pending. No further VT/VF   Co-ox stable at 73% off milrinone   Denies dyspnea. Main complaint is being "thirsty". Currently NPO and asking for water. Family present at bedside.   Objective:   Weight Range: 99.1 kg Body mass index is 39.96 kg/m.   Vital Signs:   Temp:  [97.8 F (36.6 C)-98.9 F (37.2 C)] 97.8 F (36.6 C) (02/03 0800) Pulse Rate:  [68-118] 78 (02/03 0847) Resp:  [11-25] 11 (02/03 0847) BP: (88-139)/(51-83) 128/81 (02/03 0847) SpO2:  [91 %-100 %] 94 % (02/03 0847) Arterial Line BP: (73-143)/(61-98) 74/67 (02/02 2330) FiO2 (%):  [36 %-44 %] 36 % (02/02 1450) Weight:  [99.1 kg] 99.1 kg (02/03 0500) Last BM Date : 12/09/23  Weight change: Filed Weights   12/08/23 0500 12/09/23 0531 12/10/23 0500  Weight: 101.5 kg 105.4 kg 99.1 kg    Intake/Output:   Intake/Output Summary (Last 24 hours) at 12/10/2023 0909 Last data filed at 12/10/2023 0800 Gross per 24 hour  Intake 1569.58 ml  Output 4655 ml  Net -3085.42 ml      Physical Exam    General:  Well appearing, moderately obese. No respiratory difficulty HEENT: normal Neck: supple. +RIJ CVC, JVD not elevated, Carotids 2+ bilat; no bruits. No lymphadenopathy or thyromegaly appreciated. Cor: PMI nondisplaced. Regular rate & rhythm. No rubs, gallops or murmurs. Lungs: clear Abdomen: soft, nontender, nondistended. No hepatosplenomegaly. No bruits or masses. Good bowel sounds. Extremities: no cyanosis, clubbing, rash, edema Neuro: alert & oriented x 3, cranial nerves grossly intact. moves all 4 extremities w/o difficulty. Affect pleasant.   Telemetry   NSR  70s, no further VT/VF   Labs    CBC Recent Labs    12/07/23 1634 12/08/23 0411 12/09/23 0411 12/10/23 0427  WBC 38.1*   < > 25.3* 23.4*  NEUTROABS 31.8*  --   --   --   HGB 12.3   < > 8.6* 8.9*  HCT 36.5   < > 26.4* 27.1*  MCV 89.9   < > 93.3 93.8  PLT 203   < > PLATELET CLUMPS NOTED ON SMEAR, UNABLE TO ESTIMATE 88*   < > = values in this interval not displayed.   Basic Metabolic Panel Recent Labs    91/47/82 1723 12/09/23 0411 12/09/23 1706 12/10/23 0427  NA 143 142 143 142  K 3.5 4.2 3.9 4.7  CL 112* 111 105 107  CO2 23 24 30 29   GLUCOSE 211* 289* 187* 134*  BUN 36* 39* 37* 33*  CREATININE 1.09* 1.08* 0.87 0.73  CALCIUM 7.5* 7.5* 7.9* 8.1*  MG 2.1 2.2 2.2 2.4  PHOS 3.8 2.9  --   --    Liver Function Tests Recent Labs    12/09/23 0411  AST 53*  ALT 58*  ALKPHOS 59  BILITOT 0.7  PROT 4.7*  ALBUMIN 2.2*   No results for input(s): "LIPASE", "AMYLASE" in the last 72 hours. Cardiac Enzymes No results for input(s): "CKTOTAL", "CKMB", "CKMBINDEX", "TROPONINI" in the last 72 hours.  BNP: BNP (last 3 results) No results for input(s): "BNP" in the last 8760 hours.  ProBNP (  last 3 results) No results for input(s): "PROBNP" in the last 8760 hours.   D-Dimer No results for input(s): "DDIMER" in the last 72 hours. Hemoglobin A1C No results for input(s): "HGBA1C" in the last 72 hours. Fasting Lipid Panel No results for input(s): "CHOL", "HDL", "LDLCALC", "TRIG", "CHOLHDL", "LDLDIRECT" in the last 72 hours. Thyroid Function Tests No results for input(s): "TSH", "T4TOTAL", "T3FREE", "THYROIDAB" in the last 72 hours.  Invalid input(s): "FREET3"   Other results:     Medications:     Scheduled Medications:  atorvastatin  80 mg Per Tube Daily   Chlorhexidine Gluconate Cloth  6 each Topical Daily   docusate  100 mg Per Tube BID   heparin injection (subcutaneous)  5,000 Units Subcutaneous Q8H   insulin aspart  0-15 Units Subcutaneous Q4H   insulin  glargine-yfgn  10 Units Subcutaneous BID   ipratropium-albuterol  3 mL Nebulization Q4H   methylPREDNISolone (SOLU-MEDROL) injection  40 mg Intravenous Daily   multivitamin with minerals  1 tablet Per Tube Daily   mouth rinse  15 mL Mouth Rinse 4 times per day   pantoprazole (PROTONIX) IV  40 mg Intravenous Q12H   polyethylene glycol  17 g Per Tube Daily   senna  1 tablet Per Tube Daily    Infusions:  sodium chloride 10 mL/hr at 12/10/23 0800   feeding supplement (VITAL AF 1.2 CAL) Stopped (12/09/23 1900)   lidocaine 1 mg/min (12/10/23 0800)   magnesium sulfate bolus IVPB     piperacillin-tazobactam (ZOSYN)  IV 12.5 mL/hr at 12/10/23 0800    PRN Medications: sodium chloride, acetaminophen (TYLENOL) oral liquid 160 mg/5 mL, docusate, fentaNYL, mouth rinse, polyethylene glycol, prochlorperazine     Assessment/Plan  1. Recurrent VF arrest: -01/30 had resuscitated VF arrest followed by torsades/recurrent VF d/t R on T phenomenon. Temp pacer placed in cath lab (removed 2/2).  -Cardiac cath with no significant CAD or evidence of SCAD -Echo 01/30: EF 40-45%, septal AK, no aortic dissection -Suspect electrolyte mediated but some concern for coronary vasospasm given EKG change/troponin. Myocarditis also a consideration.  - No recurrent ventricular arrhythmias. Now off amio. Remains on lidocaine gtt at 1 mg/min. Will d/c lidocaine gtt today - Plan cMRI today. If unremarkable, will plan ICD prior to d/c    2. Acute systolic CHF with prominant RV failure >> cardiogenic shock: - shock resolved - Echo EF 40-45%  - CO/CI normalized, swan removed. Co-ox 72% today  - cMRI today    3. Acute respiratory failure with hypercarbia and hypoxia/UTI/leukocytosis: Improved - On zosyn, E. Coli in the urine - Off steroids as above - WBC downtrending    4. Hypokalemia Hypomagnesemia Hypocalcemia -Lytes supplemented aggressively   D/w CCM, ok to transfer out of ICU today   Length of Stay:  [address] PA-C  12/10/2023, 9:09 AM  Advanced Heart Failure Team Pager [phone] (M-F; 7a - 5p)  Please contact [facility] for night-coverage after hours (5p -7a ) and weekends on [email or web address]  Patient seen with APP, plan extensively discussed.   Subjective: - Feels well today; no complaints.    Exam: Blood pressure 128/81, pulse 78, temperature 97.8 F (36.6 C), temperature source Oral, resp. rate 11, height 5\' 2"  (1.575 m), weight 99.1 kg, last menstrual period 03/02/2012, SpO2 94%.  GENERAL: NAD Lungs- CTA B/L CARDIAC:  JVP: 7 cm          Normal rate with regular rhythm. no murmur.  Pulses 1+. no edema.  ABDOMEN: Soft,  non-tender, non-distended.  EXTREMITIES: Warm and well perfused.  NEUROLOGIC: No obvious FND   A/P - No recurrent arrhythmias on telemetry - D/C lidocaine today; discussed with EP.  - Plan for cardiac MRI today followed by potential ICD.  - Transfer to floor.   Gabor Lusk Advanced Heart Failure

## 2023-12-10 NOTE — Plan of Care (Signed)
Patient is stable to come of the ICU after being extubated yesterday and remains neurologically intact.  FMTS to take over care on 2/4 at 7 AM.   Fortunato Curling, DO [facility], PGY-1 12/10/23 1:46 PM  Service pager [phone]

## 2023-12-10 NOTE — Progress Notes (Signed)
Inpatient Rehab Admissions Coordinator:   Per therapy recommendations,  patient was screened for CIR candidacy by Megan Salon, MS, CCC-SLP. At this time, Pt. Appears to be a a potential candidate for AIR; however, her insurance is out of network. TOC will need to look at other AIR facilities. I will not place   order for rehab consult.  Please contact me any with questions.  Megan Salon, MS, CCC-SLP Rehab Admissions Coordinator  [phone] (celll) [phone] (office)

## 2023-12-10 NOTE — Progress Notes (Cosign Needed Addendum)
  Patient Name: Deanna Scott Date of Encounter: 12/10/2023  Primary Cardiologist: Chilton Si, MD Electrophysiologist: None  Interval Summary   Pt is awake this morning and complains of N/V/D prior to admission. Denies h/o syncope prior to this. Has history of PVCs  Vital Signs    Vitals:   12/10/23 0430 12/10/23 0500 12/10/23 0530 12/10/23 0600  BP: 107/61 112/68 126/83 126/74  Pulse: 80 76 76 73  Resp: 15 15 15 17   Temp:      TempSrc:      SpO2: 94% 96% 97% 98%  Weight:  99.1 kg    Height:        Intake/Output Summary (Last 24 hours) at 12/10/2023 1610 Last data filed at 12/10/2023 0601 Gross per 24 hour  Intake 1238.25 ml  Output 4655 ml  Net -3416.75 ml   Filed Weights   12/08/23 0500 12/09/23 0531 12/10/23 0500  Weight: 101.5 kg 105.4 kg 99.1 kg    Physical Exam    GEN- The patient is well appearing, alert and oriented x 3 today.   Lungs- Clear to ausculation bilaterally, normal work of breathing Cardiac- Regular rate and rhythm, no murmurs, rubs or gallops GI- soft, NT, ND, + BS Extremities- no clubbing or cyanosis. No edema  Telemetry    NSR 70-80s (personally reviewed)  Hospital Course    Pamelyn Bancroft is a 53 y.o. female with a history of HTN, HLD, CSF leak s/p repair, tobacco use, THC abuse who initially presented to the ED on 12/05/2023 after a syncopal episode associated with bowel and bladder incontinence. At approximately noon on 1/30, the patient had a cardiac arrest. In the hour preceding her arrest it appears that she had progressively slowing sinus rhythm ultimately with sinus bradycardia and frequent ectopy. It appears that one of the short couple PVCs ultimately triggered a polymorphic VT arrest. It is unclear caused her precipitating bradycardia. I suspect this was secondary to something else. Of note, her potassium at the time of cardiac arrest was found to be 2.2 via i-STAT labs and her magnesium was 1.0. If this is can be believed then  profound hypokalemia could have caused the bradycardia, ectopy and ventricular arrhythmia. Her electrolytes continued to fluctuate wildly throughout that 24-hour period were also associated with profound hyperglycemia. Her LV ejection fraction was found to be mildly decreased post arrest. The patient remains intubated and additional history is likely needed to help elucidate etiology of her arrest. If her arrhythmia can be attributed to a reversible cause such as electrolyte derangements. Then ICD may not be necessarily warranted. However, if no reversible etiology can be found the patient would need secondary prevention ICD prior to discharge.   Assessment & Plan    PMVT/VF cardiac arrest Hypokalemia Hypomagnesemia Hypocalcemia  Acute systolic heart failure  Pt complaints of GI illness prior to admission.  Ok to discontinue Lidocaine and monitor.  Aggressively monitor and replete electrolytes  Would plan for cMRI.  Potentially ICD pending course and results.   For questions or updates, please contact [facility] Please consult [email or web address] for contact info under Cardiology/STEMI.  Signed, Graciella Freer, PA-C  12/10/2023, 7:12 AM

## 2023-12-11 ENCOUNTER — Inpatient Hospital Stay (HOSPITAL_COMMUNITY): Payer: No Typology Code available for payment source

## 2023-12-11 DIAGNOSIS — N3000 Acute cystitis without hematuria: Secondary | ICD-10-CM | POA: Diagnosis not present

## 2023-12-11 DIAGNOSIS — D649 Anemia, unspecified: Secondary | ICD-10-CM | POA: Diagnosis not present

## 2023-12-11 DIAGNOSIS — I4901 Ventricular fibrillation: Secondary | ICD-10-CM | POA: Diagnosis not present

## 2023-12-11 DIAGNOSIS — N179 Acute kidney failure, unspecified: Secondary | ICD-10-CM | POA: Diagnosis not present

## 2023-12-11 DIAGNOSIS — I4729 Other ventricular tachycardia: Principal | ICD-10-CM

## 2023-12-11 DIAGNOSIS — J9601 Acute respiratory failure with hypoxia: Secondary | ICD-10-CM | POA: Diagnosis not present

## 2023-12-11 LAB — BASIC METABOLIC PANEL
Anion gap: 9 (ref 5–15)
Anion gap: 11 (ref 5–15)
BUN: 23 mg/dL — ABNORMAL HIGH (ref 6–20)
BUN: 23 mg/dL — ABNORMAL HIGH (ref 6–20)
CO2: 28 mmol/L (ref 22–32)
CO2: 23 mmol/L (ref 22–32)
Calcium: 8.6 mg/dL — ABNORMAL LOW (ref 8.9–10.3)
Calcium: 8.7 mg/dL — ABNORMAL LOW (ref 8.9–10.3)
Chloride: 103 mmol/L (ref 98–111)
Chloride: 101 mmol/L (ref 98–111)
Creatinine, Ser: 0.68 mg/dL (ref 0.44–1.00)
Creatinine, Ser: 0.71 mg/dL (ref 0.44–1.00)
GFR, Estimated: >60 mL/min (ref >60)
GFR, Estimated: >60 mL/min (ref >60)
Glucose, Bld: 110 mg/dL — ABNORMAL HIGH (ref 70–99)
Glucose, Bld: 194 mg/dL — ABNORMAL HIGH (ref 70–99)
Potassium: 4.5 mmol/L (ref 3.5–5.1)
Potassium: 4.4 mmol/L (ref 3.5–5.1)
Sodium: 140 mmol/L (ref 135–145)
Sodium: 135 mmol/L (ref 135–145)

## 2023-12-11 LAB — CBC
HCT: 29 % — ABNORMAL LOW (ref 36.0–46.0)
Hemoglobin: 9.3 g/dL — ABNORMAL LOW (ref 12.0–15.0)
MCH: 30.2 pg (ref 26.0–34.0)
MCHC: 32.1 g/dL (ref 30.0–36.0)
MCV: 94.2 fL (ref 80.0–100.0)
Platelets: 109 10*3/uL — ABNORMAL LOW (ref 150–400)
RBC: 3.08 MIL/uL — ABNORMAL LOW (ref 3.87–5.11)
RDW: 14.6 % (ref 11.5–15.5)
WBC: 25.3 10*3/uL — ABNORMAL HIGH (ref 4.0–10.5)
nRBC: 0.3 % — ABNORMAL HIGH (ref 0.0–0.2)

## 2023-12-11 LAB — GLUCOSE, CAPILLARY
Glucose-Capillary: 85 mg/dL (ref 70–99)
Glucose-Capillary: 102 mg/dL — ABNORMAL HIGH (ref 70–99)
Glucose-Capillary: 141 mg/dL — ABNORMAL HIGH (ref 70–99)
Glucose-Capillary: 201 mg/dL — ABNORMAL HIGH (ref 70–99)
Glucose-Capillary: 144 mg/dL — ABNORMAL HIGH (ref 70–99)

## 2023-12-11 LAB — MAGNESIUM: Magnesium: 2.2 mg/dL (ref 1.7–2.4)

## 2023-12-11 LAB — COOXEMETRY PANEL
Carboxyhemoglobin: 2.1 % — ABNORMAL HIGH (ref 0.5–1.5)
Methemoglobin: 0.9 % (ref 0.0–1.5)
O2 Saturation: 77.2 %
Total hemoglobin: 9.7 g/dL — ABNORMAL LOW (ref 12.0–16.0)

## 2023-12-11 LAB — TROPONIN I (HIGH SENSITIVITY)
Troponin I (High Sensitivity): 43 ng/L — ABNORMAL HIGH (ref <18)
Troponin I (High Sensitivity): 34 ng/L — ABNORMAL HIGH (ref <18)

## 2023-12-11 MED ORDER — LOSARTAN POTASSIUM 25 MG PO TABS
25.0000 mg | ORAL_TABLET | Freq: Every day | ORAL | Status: DC
  Administered 2023-12-12: 25 mg via ORAL
  Filled 2023-12-11: qty 1

## 2023-12-11 MED ORDER — SIMETHICONE 80 MG PO CHEW
80.0000 mg | CHEWABLE_TABLET | Freq: Four times a day (QID) | ORAL | Status: DC | PRN
  Administered 2023-12-11: 80 mg via ORAL
  Filled 2023-12-11: qty 1

## 2023-12-11 MED ORDER — ALTEPLASE 2 MG IJ SOLR
2.0000 mg | Freq: Once | INTRAMUSCULAR | Status: DC
  Filled 2023-12-11: qty 2

## 2023-12-11 MED ORDER — GADOBUTROL 1 MMOL/ML IV SOLN
10.0000 mL | Freq: Once | INTRAVENOUS | Status: AC | PRN
  Administered 2023-12-11: 10 mL via INTRAVENOUS

## 2023-12-11 NOTE — Assessment & Plan Note (Deleted)
 Sudden onset confusion 8am 12/05/23 with collapse, unknown LOC. CT head negative for acute pathology. CXR negative for acute abnormality. Lactic acid 2.5. UDS positive for THC. ETOH<10. Ammonia 19. U/A positive for nitrites, s/p Rocephin x1 dose in the ED. Suspect this could be a seizure given reported story, however she does not have a history of seizures.  EEG negative for seizure.  MRI brain unremarkable. Troponins slightly elevated at 161 in the ED, however trended down.  EKG this morning with new T wave inversions so cardiology was consulted, gave patient 325 mg aspirin, added 80 mg atorvastatin, and started heparin per pharmacy - cardiology consulted - consider neurology consult (concern for seizure) - f/u echo - Med-telemetry - troponins - trend lactate - Urine culture - Blood cultures - Cardiac telemetry - Caution CNS acting medications - CIWAs - Delirium, fall, seizure precautions - AM CMP, CBC

## 2023-12-11 NOTE — Assessment & Plan Note (Signed)
Likely secondary to cardiogenic shock, echo showed EF of 40 to 45% with normal right ventricular function. -Start GDMT per cardiology -Losartan 25 mg today

## 2023-12-11 NOTE — Progress Notes (Addendum)
Awaiting chest x-ray to assess internal jugular/swan Ganz tip placement. Tomasita Morrow, RN VAST

## 2023-12-11 NOTE — Progress Notes (Signed)
 OT Cancellation Note  Patient Details Name: Deanna Scott MRN: [id number] DOB: 1971-02-21   Cancelled Treatment:    Reason Eval/Treat Not Completed: Other (comment);Patient at procedure or test/ unavailable Planned to see pt this AM for OT eval though advised pt transferring units soon. Checked back and pt had not yet transferred units but gone to MRI at this time. Will follow up as schedule permits.   Jahlisa Rossitto  Britt 12/11/2023, 12:08 PM

## 2023-12-11 NOTE — Assessment & Plan Note (Deleted)
 Afib in EMS EKG, converted to NSR here. No therapeutic anticoagulation indicated at this time. -Monitor on tele -Echo

## 2023-12-11 NOTE — Plan of Care (Signed)
 FMTS Interim Progress Note  S: Pt seen this AM due to concern for chest pain. Pt endorses L sided chest pain, non radiating. Denies SOB, nausea, lightheadedness. Has had some discomfort in the area after her chest compressions per patient and RN.   O: BP (!) 143/86 (BP Location: Right Arm)   Pulse 72   Temp 98.1 F (36.7 C) (Oral)   Resp 18   Ht 5' 2 (1.575 m)   Wt 97.2 kg   LMP 03/02/2012   SpO2 94%   BMI 39.19 kg/m    Gen: NAD, alert CV: RRR, chest wall tender to palpation Pulm: CTAB normal WOB    A/P:  Chest pain Hemodynamically stable Electrolytes unremarkable this morning Suspect likely MSK given reproducible on exam and recent chest compressions Given her history and risk will obtain EKG and troponin but lower suspicion for ACS  Full FMTS progress note to follow  Romelle Booty, MD 12/11/2023, 8:57 AM PGY-2, [facility] Service pager [phone]

## 2023-12-11 NOTE — Progress Notes (Addendum)
 Advanced Heart Failure Rounding Note  Cardiologist: Annabella Scarce, MD   Chief Complaint: VF arrest   Patient Profile  53 y/o F admitted w/ VF arrest followed by torsades/recurrent VF d/t R on T phenomenon. Cath clean cors, no SCAD. EF 45%.   Subjective:    Neurologically recovered.    Off lidocaine  gtt. No further VT/VF.   Co-ox 77%.   Awaiting cMRI.   No complaints today. She denies CP. No dyspnea or palpitations. Daughter at bedside.   Objective:   Weight Range: 97.2 kg Body mass index is 39.19 kg/m.   Vital Signs:   Temp:  [97.9 F (36.6 C)-98.1 F (36.7 C)] 98.1 F (36.7 C) (02/04 0400) Pulse Rate:  [65-83] 72 (02/04 0800) Resp:  [15-26] 18 (02/04 0800) BP: (125-143)/(62-88) 143/86 (02/04 0800) SpO2:  [92 %-100 %] 94 % (02/04 0800) Weight:  [97.2 kg] 97.2 kg (02/04 0445) Last BM Date : 12/09/23  Weight change: Filed Weights   12/09/23 0531 12/10/23 0500 12/11/23 0445  Weight: 105.4 kg 99.1 kg 97.2 kg    Intake/Output:   Intake/Output Summary (Last 24 hours) at 12/11/2023 0901 Last data filed at 12/11/2023 0800 Gross per 24 hour  Intake 672.7 ml  Output 2950 ml  Net -2277.3 ml      Physical Exam    General:  fatigued appearing. No respiratory difficulty HEENT: normal Neck: supple. JVD note elevated. Carotids 2+ bilat; no bruits. No lymphadenopathy or thyromegaly appreciated. Cor: PMI nondisplaced. Regular rate & rhythm. No rubs, gallops or murmurs. Lungs: clear Abdomen: soft, nontender, nondistended. No hepatosplenomegaly. No bruits or masses. Good bowel sounds. Extremities: no cyanosis, clubbing, rash, edema Neuro: alert & oriented x 3, cranial nerves grossly intact. moves all 4 extremities w/o difficulty. Affect pleasant.    Telemetry   NSR 70s. No further VT/VF, personally reviewed   Labs    CBC Recent Labs    12/10/23 0427 12/11/23 0446  WBC 23.4* 25.3*  HGB 8.9* 9.3*  HCT 27.1* 29.0*  MCV 93.8 94.2  PLT 88* 109*    Basic Metabolic Panel Recent Labs    97/98/74 1723 12/09/23 0411 12/09/23 1706 12/10/23 0427 12/10/23 1622 12/11/23 0446  NA 143 142 143 142 140 140  K 3.5 4.2 3.9 4.7 4.6 4.5  CL 112* 111 105 107 104 103  CO2 23 24 30 29 27 28   GLUCOSE 211* 289* 187* 134* 230* 110*  BUN 36* 39* 37* 33* 29* 23*  CREATININE 1.09* 1.08* 0.87 0.73 0.78 0.68  CALCIUM  7.5* 7.5* 7.9* 8.1* 8.5* 8.6*  MG 2.1 2.2 2.2 2.4  --   --   PHOS 3.8 2.9  --   --   --   --    Liver Function Tests Recent Labs    12/09/23 0411  AST 53*  ALT 58*  ALKPHOS 59  BILITOT 0.7  PROT 4.7*  ALBUMIN 2.2*   No results for input(s): LIPASE, AMYLASE in the last 72 hours. Cardiac Enzymes No results for input(s): CKTOTAL, CKMB, CKMBINDEX, TROPONINI in the last 72 hours.  BNP: BNP (last 3 results) No results for input(s): BNP in the last 8760 hours.  ProBNP (last 3 results) No results for input(s): PROBNP in the last 8760 hours.   D-Dimer No results for input(s): DDIMER in the last 72 hours. Hemoglobin A1C No results for input(s): HGBA1C in the last 72 hours. Fasting Lipid Panel No results for input(s): CHOL, HDL, LDLCALC, TRIG, CHOLHDL, LDLDIRECT in the last 72 hours.  Thyroid Function Tests No results for input(s): TSH, T4TOTAL, T3FREE, THYROIDAB in the last 72 hours.  Invalid input(s): FREET3   Other results:     Medications:     Scheduled Medications:  atorvastatin   80 mg Oral Daily   Chlorhexidine  Gluconate Cloth  6 each Topical Daily   insulin  aspart  0-15 Units Subcutaneous Q4H   ipratropium-albuterol   3 mL Nebulization BID   LORazepam   1 mg Intravenous Once   methylPREDNISolone  (SOLU-MEDROL ) injection  40 mg Intravenous Daily   multivitamin with minerals  1 tablet Oral Daily   mouth rinse  15 mL Mouth Rinse 4 times per day   pantoprazole   40 mg Oral Daily   senna  1 tablet Oral Daily    Infusions:  sodium chloride  10 mL/hr at 12/11/23 0800    piperacillin -tazobactam (ZOSYN )  IV 12.5 mL/hr at 12/11/23 0800    PRN Medications: sodium chloride , acetaminophen , docusate sodium , mouth rinse, polyethylene glycol, prochlorperazine , simethicone      Assessment/Plan  1. Recurrent VF arrest: -01/30 had resuscitated VF arrest followed by torsades/recurrent VF d/t R on T phenomenon. Temp pacer placed in cath lab (removed 2/2).  -Cardiac cath with no significant CAD or evidence of SCAD -Echo 01/30: EF 40-45%, septal AK, no aortic dissection -Suspect electrolyte mediated but some concern for coronary vasospasm given EKG change/troponin. Myocarditis also a consideration.  - She is now off lidocaine  gtt. Rhythm stable. No further VT/VF  - Plan cMRI today. If unremarkable, will plan ICD prior to d/c. D/w EP at bedside.     2. Acute systolic CHF >> cardiogenic shock: - shock resolved - Echo EF 40-45%, RV normal   - CO/CI normalized, swan removed. 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 SCr normal. K 4.5. Will start GDMT - start losartan  25 mg daily - plan spiro tomorrow if K permits    3. Acute respiratory failure with hypercarbia and hypoxia - resolved   4. UTI - On zosyn , E. Coli in the urine    4. Hypokalemia Hypomagnesemia Hypocalcemia -Lytes supplemented aggressively and now WNL   Has transfer orders. Remains stable for d/c to floor when bed available. Gen cards will resume care once out of ICU.   Length of Stay: [address], PA-C  12/11/2023, 9:01 AM  Advanced Heart Failure Team Pager [phone] (M-F; 7a - 5p)  Please contact [facility] for night-coverage after hours (5p -7a ) and weekends on [email or web address]

## 2023-12-11 NOTE — Progress Notes (Signed)
 Daily Progress Note Intern Pager: [phone]  Patient name: Deanna Scott Medical record number: [id number] Date of birth: [birth year] Age: 53 y.o. Gender: female  Primary Care Provider: Dartha Geralds, DO Consultants: Cardiology Code Status: Full  Pt Overview and Major Events to Date:  1/29- admitted to FMTS 1/30- cardiac arrest with ROSC, admitted to ICU 2/4- patient stable to transfer to floor  Assessment and Plan:  This is a 53 year old female who initially presented with altered mental status and then had cardiac arrest with ROSC who was admitted to the ICU now returning to our service.  While in the ICU she was treated for cardiogenic shock and electrolyte abnormalities and is now in stable condition.  Cardiology and electrophysiology are following and continuing to be involved in her care. Assessment & Plan Cardiac arrest [facility]) Underlying cause still uncertain, patient is now followed by both cardiology and EP. Plan is for cardiac MRI today to r/o myocarditis, then proceed with ICD implantation if no evidence.  Was endorsing some chest pain this morning but it was self-limiting did not correlate with any EKG abnormalities. -Cardiology and electrophysiology following, appreciate recommendations. -Lidocaine  gtt. discontinued prior to transfer to floor -Maintain K greater than 4 and mag greater than 2 Acute HFrEF (heart failure with reduced ejection fraction) (HCC) Likely secondary to cardiogenic shock, echo showed EF of 40 to 45% with normal right ventricular function. -Start GDMT per cardiology -Losartan  25 mg today Urinary tract infection Urine grew e. Coli while she was in the ICU. She has been treated with antibiotics for approximately 7 days. Current medication below: -zosyn  3.375 g q8h, final dose this pm, will discontinue tomorrow.  Transaminitis Plan to recheck CMP morning Anemia Hemoglobin this morning was 9.3 has been trending downward steadily during her ICU  admission.  Given concern for volume status we will start iron  study workup and monitor closely. -TIBC, ferritin, iron  panel -AM CBC  Chronic and Stable Problems:  HTN: holding home medications pending review GERD: Protonix  40 mg  FEN/GI: Regular PPx: SCDs Dispo:Home pending clinical improvement .    Subjective:  Patient was awake and alert lying in bed this morning.  Daughter was at bedside and reports that she is much closer to her usual self today.  Reports no concerning chest pain just some soreness.  Objective: Temp:  [97.6 F (36.4 C)-98.1 F (36.7 C)] 98.1 F (36.7 C) (02/04 0400) Pulse Rate:  [65-83] 78 (02/04 0700) Resp:  [11-26] 23 (02/04 0700) BP: (125-136)/(62-88) 131/76 (02/04 0600) SpO2:  [92 %-100 %] 95 % (02/04 0700) Weight:  [97.2 kg] 97.2 kg (02/04 0445) Physical Exam: General: Tired appearing female no distress Cardiovascular: RRR, no M/R/G Respiratory: CTAB, no increased work of breathing Abdomen: Flat, soft, nontender Extremities: Left upper extremity grossly swollen due to infiltration of peripheral IV.  Right upper extremity 2+ peripheral pulses.  Moves all 4 limbs spontaneously and appropriately.  Laboratory: Most recent CBC Lab Results  Component Value Date   WBC 25.3 (H) 12/11/2023   HGB 9.3 (L) 12/11/2023   HCT 29.0 (L) 12/11/2023   MCV 94.2 12/11/2023   PLT 109 (L) 12/11/2023   Most recent BMP    Latest Ref Rng & Units 12/11/2023    4:46 AM  BMP  Glucose 70 - 99 mg/dL 889   BUN 6 - 20 mg/dL 23   Creatinine 9.55 - 1.00 mg/dL 9.31   Sodium 864 - 854 mmol/L 140   Potassium 3.5 - 5.1 mmol/L 4.5  Chloride 98 - 111 mmol/L 103   CO2 22 - 32 mmol/L 28   Calcium  8.9 - 10.3 mg/dL 8.6    Cleotilde Lukes, DO 12/11/2023, 7:43 AM  PGY-1, [facility] FPTS Intern pager: [phone], text pages welcome Secure chat group [facility] Teaching Service

## 2023-12-11 NOTE — Progress Notes (Signed)
 VAST consult. Arrived to unit. Havy RN stopped this nurse. Stated waiting for doctor to respond. This line may be removed and does not want it declotted at this time, until POC is confirmed. Instructed to update consult when confirmation received with POC. VU. Powell Bowler, RN VAST

## 2023-12-11 NOTE — TOC Progression Note (Addendum)
 Transition of Care [facility]) - Progression Note    Patient Details  Name: Deanna Scott MRN: [id number] Date of Birth: [birth year]  Transition of [facility] CM/SW Contact  Arlana JINNY Nicholaus ISRAEL Phone Number: [phone] 12/11/2023, 9:13 AM  Clinical Narrative:   9:13 AM- HF CSW called [facility] to see if they have IP rehab bed, #[phone] available. CSW left a VM asking to be called back.   9:19 AM- HF CSW received a phone call back from facility. Dorothe stated that no referral had been received. CSW asked about referral process and documentation needed. Dorothe provided fax # [phone] for documents to be sent over to.  All of the necessary documents were faxed. Offer pending.   11:18 AM- CSW tried to call daughter, Carnella. Could not leave a VM due to mailbox not being setup yet. CW will continue to make contact by phone throughout the day.    TOC will continue following.          Expected Discharge Plan and Services                                               Social Determinants of Health (SDOH) Interventions SDOH Screenings   Food Insecurity: Food Insecurity Present (01/10/2023)  Housing: Low Risk  (01/10/2023)  Transportation Needs: No Transportation Needs (01/10/2023)  Utilities: Not At Risk (01/10/2023)  Depression (PHQ2-9): Low Risk  (10/19/2023)  Financial Resource Strain: Medium Risk (01/10/2023)  Tobacco Use: Medium Risk (12/05/2023)    Readmission Risk Interventions     No data to display

## 2023-12-11 NOTE — Assessment & Plan Note (Deleted)
 AST 77. Likely alcohol vx fatty liver contributing. -Hepatitis panel -CMP

## 2023-12-11 NOTE — Progress Notes (Signed)
 Pharmacy Antibiotic Note  Deanna Scott is a 53 y.o. female admitted on 12/05/2023 with sepsis -r/o bacteremia initially started on vancomycin  and cefepime .  WBC up in setting of steroids but now spiking fever 100.9.    Pharmacy has been consulted to change to zosyn  dosing for GNR UTI and possible intra abdominal process.   Currently on day #7 of antibiotics - day #4 of zosyn . WBC still elevated at 25 but on steroids for 5 days. LA normalized to 1.1 on 2/1. Afebrile. Cx negative besides Ucx on 1/29 which grew >100k E Coli (I ampicillin ).   Plan: Continue zosyn  3.375 gm IV q8h EI Monitor renal fx, cx results, clinical pic, determine total lenth of therapy.    Height: 5' 2 (157.5 cm) Weight: 97.2 kg (214 lb 4.6 oz) IBW/kg (Calculated) : 50.1  Temp (24hrs), Avg:97.9 F (36.6 C), Min:97.6 F (36.4 C), Max:98.1 F (36.7 C)  Recent Labs  Lab 12/07/23 0601 12/07/23 0850 12/07/23 0948 12/07/23 1019 12/07/23 1228 12/07/23 1310 12/07/23 1634 12/08/23 0411 12/08/23 1201 12/08/23 1723 12/09/23 0411 12/09/23 1706 12/10/23 0427 12/10/23 1622 12/11/23 0446  WBC  --   --   --   --   --   --  38.1* 33.7*  --   --  25.3*  --  23.4*  --  25.3*  CREATININE  --   --    < >  --    < >  --  1.12* 1.11*  --    < > 1.08* 0.87 0.73 0.78 0.68  LATICACIDVEN 5.7* 4.4*  --  3.0*  --  2.8*  --   --  1.1  --   --   --   --   --   --    < > = values in this interval not displayed.    Estimated Creatinine Clearance: 87.4 mL/min (by C-G formula based on SCr of 0.68 mg/dL).    Allergies  Allergen Reactions   Latex Itching and Dermatitis   Norco [Hydrocodone -Acetaminophen ] Other (See Comments)    CNS dysphoria   Percocet [Oxycodone -Acetaminophen ] Other (See Comments)    CNS Dysphoria    Antimicrobials this admission: 2/1 Zosyn  >> 1/30 Vanc >> 2/1 1/30 Cefepime  >> 2/1 1/29 Rocephin  >>1/30 1/30 Unasyn  x 1  Microbiology results: 1/29 BCx: ngtd 1/29 UCx:  GNR 1/30 MRSA PCR: negative  Thank  you for allowing pharmacy to participate in this patient's care,  Suzen Sour, PharmD, BCCCP Clinical Pharmacist  Phone: [phone] 12/11/2023 7:51 AM  Please check AMION for all [facility] Pharmacy phone numbers After 10:00 PM, call Main Pharmacy [phone]

## 2023-12-11 NOTE — Progress Notes (Signed)
 Patient arrived to unit at this time. TELE applied and confirmed. Family at bedside, requesting to have PURWICK place. CVL able to flush but no blood return. IV team consulted.  MD notified to evaluate the need of CVL. Phlebotomist notifed to draw labs. No other concerns voice. Will continue to monitor.

## 2023-12-11 NOTE — Progress Notes (Signed)
  Patient Name: Deanna Scott Date of Encounter: 12/11/2023  Primary Cardiologist: Annabella Scarce, MD Electrophysiologist: None  Interval Summary   No new complaints this am.   Vital Signs    Vitals:   12/11/23 0445 12/11/23 0500 12/11/23 0600 12/11/23 0700  BP:   131/76   Pulse:  71 65 78  Resp:  (!) 21 (!) 22 (!) 23  Temp:      TempSrc:      SpO2:  94% 98% 95%  Weight: 97.2 kg     Height:        Intake/Output Summary (Last 24 hours) at 12/11/2023 0752 Last data filed at 12/11/2023 0700 Gross per 24 hour  Intake 650.8 ml  Output 2950 ml  Net -2299.2 ml   Filed Weights   12/09/23 0531 12/10/23 0500 12/11/23 0445  Weight: 105.4 kg 99.1 kg 97.2 kg    Physical Exam    GEN- The patient is fatigued appearing, alert and oriented x 3 today.   Lungs- Clear to ausculation bilaterally, normal work of breathing Cardiac- Regular rate and rhythm with occasional ectopy, no murmurs, rubs or gallops GI- soft, NT, ND, + BS Extremities- no clubbing or cyanosis. No edema  Telemetry    NSR 60-70s with occasional PVCs (personally reviewed)  Hospital Course    Deanna Scott is a 53 y.o. female with a history of HTN, HLD, CSF leak s/p repair, tobacco use, THC abuse who initially presented to the ED on 12/05/2023 after a syncopal episode associated with bowel and bladder incontinence. At approximately noon on 1/30, the patient had a cardiac arrest. In the hour preceding her arrest it appears that she had progressively slowing sinus rhythm ultimately with sinus bradycardia and frequent ectopy. It appears that one of the short couple PVCs ultimately triggered a polymorphic VT arrest. It is unclear caused her precipitating bradycardia. I suspect this was secondary to something else. Of note, her potassium at the time of cardiac arrest was found to be 2.2 via i-STAT labs and her magnesium  was 1.0. If this is can be believed then profound hypokalemia could have caused the bradycardia, ectopy and  ventricular arrhythmia. Her electrolytes continued to fluctuate wildly throughout that 24-hour period were also associated with profound hyperglycemia. Her LV ejection fraction was found to be mildly decreased post arrest. The patient remains intubated and additional history is likely needed to help elucidate etiology of her arrest. If her arrhythmia can be attributed to a reversible cause such as electrolyte derangements. Then ICD may not be necessarily warranted. However, if no reversible etiology can be found the patient would need secondary prevention ICD prior to discharge.    LHC 12/06/2023 No significant CAD  Echo 12/06/2023 LVEF 45-50%   Assessment & Plan    PMVT/VF cardiac arrest Hypokalemia Hypomagnesemia Hypocalcemia  Acute systolic heart failure Non-ischemic CMP ICD pending course and cMRI results.  Likely would benefit from genetic testing pending cMRI No further VT/NSVT off lidocaine .  Sepsis Acute respiratory failure with hypercarbia and hypoxia UTI Leukocytosis  Currently on day #7 of ABx WBC 25 but day #5 of steroids   For questions or updates, please contact [facility] Please consult [email or web address] for contact info under Cardiology/STEMI.  Signed, Ozell Prentice Passey, PA-C  12/11/2023, 7:52 AM

## 2023-12-11 NOTE — Evaluation (Addendum)
 Occupational Therapy Evaluation Patient Details Name: Deanna Scott MRN: [id number] DOB: 1970/12/17 Today's Date: 12/11/2023   History of Present Illness Pt is a 53 yo female presenting with AMS. S/p collapse on 1/29, unknown LOC Found to be +THC. ON 1/30 pt with with chest pain and DOE, became bradycardic and arrested. Cardiac MRI completed 2/4, pending results. PMH: migraines, HTN, chronic dizziness (unclear cause), and CSF leak with endoscopic repair in 2021, urinary incontinence s/p suburethral sling placement   Clinical Impression   PTA, pt lives with mother in law, typically completely independent in all ADLs, IADLs and mobility without AD. 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 Pt presents now with deficits in strength, cognition, balance, endurance and LUE edema. Pt requires Mod A for bed mobility and Mod A x 2 for BSC transfers via [place]. Pt requires Mod A for UB ADL and up to Total A (+2 if in standing) for LB ADLs. Provided squeeze ball, educated on basic AROM exercises for swollen LUE and encouraged continued elevation. Pt's daughter present, hands on to assist throughout and both hopeful for pt to regain independence with intensive rehab services at [place].      If plan is discharge home, recommend the following: A lot of help with walking and/or transfers;Two people to help with walking and/or transfers;A lot of help with bathing/dressing/bathroom;Two people to help with bathing/dressing/bathroom    Functional Status Assessment  Patient has had a recent decline in their functional status and demonstrates the ability to make significant improvements in function in a reasonable and predictable amount of time.  Equipment Recommendations  Other (comment) (TBD pending progress)    Recommendations for Other Services Rehab consult     Precautions / Restrictions Precautions Precautions: Fall;Other (comment) Precaution Comments: edematous LUE Restrictions Weight  Bearing Restrictions Per Provider Order: No      Mobility Bed Mobility Overal bed mobility: Needs Assistance Bed Mobility: Supine to Sit, Sit to Supine     Supine to sit: Mod assist, HOB elevated Sit to supine: Mod assist   General bed mobility comments: good effort to use bedrail and bring LE to EOB. assist to fully scoot and locked elbows with LUE to assist in pulling self forward (limited by edematous LUE)    Transfers Overall transfer level: Needs assistance Equipment used: Ambulation equipment used Transfers: Sit to/from Stand, Bed to chair/wheelchair/BSC Sit to Stand: Mod assist, +2 physical assistance           General transfer comment: Mod A x 2 to stand at bedside into [place] with daughter assisting OT. Use of bedpad to lift bottom + gait belt. to/from [facility] with Stedy. Min A to stand from [place] pads. Transfer via Lift Equipment: Stedy    Balance Overall balance assessment: Needs assistance Sitting-balance support: Feet supported, No upper extremity supported Sitting balance-Leahy Scale: Fair     Standing balance support: During functional activity, Bilateral upper extremity supported, Reliant on assistive device for balance Standing balance-Leahy Scale: Poor                             ADL either performed or assessed with clinical judgement   ADL Overall ADL's : Needs assistance/impaired Eating/Feeding: Set up;Sitting   Grooming: Minimal assistance;Sitting   Upper Body Bathing: Moderate assistance;Sitting   Lower Body Bathing: Maximal assistance;+2 for safety/equipment;Sitting/lateral leans;Sit to/from stand   Upper Body Dressing : Moderate assistance;Sitting   Lower Body Dressing: Maximal assistance;+2  for safety/equipment;Sitting/lateral leans;Sit to/from stand;+2 for physical assistance   Toilet Transfer: +2 for physical assistance;+2 for safety/equipment;BSC/3in1 Toilet Transfer Details (indicate cue type and reason): Use of Stedy for  transfer to/from [facility] Toileting- Architect and Hygiene: Total assistance;+2 for physical assistance;+2 for safety/equipment;Sitting/lateral lean;Sit to/from stand Toileting - Clothing Manipulation Details (indicate cue type and reason): +2 to assist to stand from [facility] into [place], one to assist with hygiene and second to assist with safety in standing though pt maintaining fairly well with Stedy bar             Vision Ability to See in Adequate Light: 0 Adequate Patient Visual Report: No change from baseline Vision Assessment?: No apparent visual deficits     Perception         Praxis         Pertinent Vitals/Pain Pain Assessment Pain Assessment: No/denies pain     Extremity/Trunk Assessment Upper Extremity Assessment Upper Extremity Assessment: Right hand dominant;LUE deficits/detail LUE Deficits / Details: edematous (from infiltrated IV per daughter) through entire UE. difficulty making grasp and impaired ROM due to swelling. provided squeeze ball, encouraged AROM of this UE within tolerance and elevation LUE Coordination: decreased fine motor   Lower Extremity Assessment Lower Extremity Assessment: Defer to PT evaluation   Cervical / Trunk Assessment Cervical / Trunk Assessment: Normal   Communication Communication Communication: No apparent difficulties   Cognition Arousal: Alert Behavior During Therapy: WFL for tasks assessed/performed Overall Cognitive Status: Impaired/Different from baseline Area of Impairment: Attention, Following commands, Safety/judgement, Awareness, Problem solving                   Current Attention Level: Selective   Following Commands: Follows one step commands consistently, Follows one step commands with increased time Safety/Judgement: Decreased awareness of safety, Decreased awareness of deficits Awareness: Emergent Problem Solving: Slow processing, Decreased initiation, Difficulty sequencing, Requires verbal cues,  Requires tactile cues General Comments: slower processing time, cues for safety and sequencing but good follow through. did endorse fatigue from events in the day. pleasant     General Comments  Daughter present and hands on to assist    Exercises     Shoulder Instructions      Home Living Family/patient expects to be discharged to:: Private residence Living Arrangements: Parent Available Help at Discharge: Family;Available 24 hours/day Type of Home: Apartment Home Access: Stairs to enter Entrance Stairs-Number of Steps: 1 Entrance Stairs-Rails: None Home Layout: One level     Bathroom Shower/Tub: Chief Strategy Officer: Handicapped height     Home Equipment: Tub bench;Other (comment) (also has multiple DME from late family member - will need to confirmDME prior to DC home after rehab)          Prior Functioning/Environment Prior Level of Function : Independent/Modified Independent;Working/employed;Driving             Mobility Comments: no AD, owns a cleaning company ADLs Comments: indep        OT Problem List: Decreased strength;Decreased activity tolerance;Impaired balance (sitting and/or standing);Decreased coordination;Decreased cognition;Decreased knowledge of use of DME or AE;Impaired UE functional use;Increased edema      OT Treatment/Interventions:      OT Goals(Current goals can be found in the care plan section) Acute Rehab OT Goals Patient Stated Goal: go to intensive rehab, regain independence OT Goal Formulation: With patient/family Time For Goal Achievement: 12/25/23 Potential to Achieve Goals: Good ADL Goals Pt Will Perform Grooming: with min assist;standing Pt Will  Perform Lower Body Bathing: with min assist;sitting/lateral leans;sit to/from stand Pt Will Transfer to Toilet: with min assist;stand pivot transfer;bedside commode Pt/caregiver will Perform Home Exercise Program: Increased strength;Both right and left upper  extremity;Independently;With written HEP provided  OT Frequency: Min 1X/week    Co-evaluation              AM-PAC OT 6 Clicks Daily Activity     Outcome Measure Help from another person eating meals?: A Little Help from another person taking care of personal grooming?: A Little Help from another person toileting, which includes using toliet, bedpan, or urinal?: A Lot Help from another person bathing (including washing, rinsing, drying)?: A Lot Help from another person to put on and taking off regular upper body clothing?: A Lot Help from another person to put on and taking off regular lower body clothing?: A Lot 6 Click Score: 14   End of Session Equipment Utilized During Treatment: Gait belt;Oxygen Nurse Communication: Mobility status  Activity Tolerance: Patient tolerated treatment well Patient left: in bed;with call bell/phone within reach;with bed alarm set;with family/visitor present;with nursing/sitter in room  OT Visit Diagnosis: Unsteadiness on feet (R26.81);Other abnormalities of gait and mobility (R26.89);Muscle weakness (generalized) (M62.81)                Time: 1331-1400 OT Time Calculation (min): 29 min Charges:  OT General Charges $OT Visit: 1 Visit OT Evaluation $OT Eval Moderate Complexity: 1 Mod OT Treatments $Self Care/Home Management : 8-22 mins  Mliss NOVAK, OTR/L Acute Rehab Services Office: [phone]   Mliss Getting 12/11/2023, 2:25 PM

## 2023-12-11 NOTE — Assessment & Plan Note (Addendum)
Urine grew e. Coli while she was in the ICU. She has been treated with antibiotics for approximately 7 days. Current medication below: -zosyn 3.375 g q8h, final dose this pm, will discontinue tomorrow.

## 2023-12-11 NOTE — Assessment & Plan Note (Addendum)
 Underlying cause still uncertain, patient is now followed by both cardiology and EP. Plan is for cardiac MRI today to r/o myocarditis, then proceed with ICD implantation if no evidence.  Was endorsing some chest pain this morning but it was self-limiting did not correlate with any EKG abnormalities. -Cardiology and electrophysiology following, appreciate recommendations. -Lidocaine  gtt. discontinued prior to transfer to floor -Maintain K greater than 4 and mag greater than 2

## 2023-12-11 NOTE — Evaluation (Signed)
 Clinical/Bedside Swallow Evaluation Patient Details  Name: Deanna Scott MRN: [id number] Date of Birth: 04-27-1971  Today's Date: 12/11/2023 Time: SLP Start Time (ACUTE ONLY): 1502 SLP Stop Time (ACUTE ONLY): 1511 SLP Time Calculation (min) (ACUTE ONLY): 9 min  Past Medical History:  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   Qualifier: Diagnosis of  By: Deanna Scott     Past Surgical History:  Past Surgical History:  Procedure Laterality Date   ABDOMINAL HYSTERECTOMY     ARTERIAL LINE INSERTION Right 12/06/2023   Procedure: ARTERIAL LINE INSERTION;  Surgeon: Deanna Scott;  Location: [facility] INVASIVE CV LAB;  Service: Cardiovascular;  Laterality: Right;   BLADDER SUSPENSION  2009   CENTRAL LINE INSERTION Left 12/06/2023   Procedure: CENTRAL LINE INSERTION;  Surgeon: Deanna Scott;  Location: [facility] INVASIVE CV LAB;  Service: Cardiovascular;  Laterality: Left;   CORONARY PRESSURE/FFR STUDY N/A 06/13/2022   Procedure: INTRAVASCULAR PRESSURE WIRE/FFR STUDY;  Surgeon: Deanna Scott;  Location: [facility] INVASIVE CV [facility];  Service: Cardiovascular;  Laterality: N/A;   DIAGNOSTIC LAPAROSCOPY     ectopic pregnancy   DILATION AND CURETTAGE OF UTERUS     hx mab   endoscopy nasal sinus surgery  01/13/2020   [facility] - CFS Leak Repair   LEFT HEART CATH AND CORONARY ANGIOGRAPHY N/A 06/13/2022   Procedure: LEFT HEART CATH AND CORONARY ANGIOGRAPHY;  Surgeon: Deanna Scott;  Location: [facility];  Service: Cardiovascular;  Laterality: N/A;   LEFT HEART CATH AND CORONARY ANGIOGRAPHY N/A 12/06/2023   Procedure: LEFT HEART  CATH AND CORONARY ANGIOGRAPHY;  Surgeon: Deanna Scott;  Location: [facility];  Service: Cardiovascular;  Laterality: N/A;   RIGHT HEART CATH N/A 12/06/2023   Procedure: RIGHT HEART CATH;  Surgeon: Deanna Scott;  Location: [facility];  Service: Cardiovascular;  Laterality: N/A;   SACROSPINOUS LIGAMENT FIXATION     posterior repair wtih cysto   SVD     x 2   TEMPORARY PACEMAKER N/A 12/06/2023   Procedure: TEMPORARY PACEMAKER;  Surgeon: Deanna Scott;  Location: [facility];  Service: Cardiovascular;  Laterality: N/A;   TRANSESOPHAGEAL ECHOCARDIOGRAM (CATH LAB) N/A 12/06/2023   Procedure: TRANSESOPHAGEAL ECHOCARDIOGRAM;  Surgeon: Deanna Scott;  Location: [facility] INVASIVE CV LAB;  Service: Cardiovascular;  Laterality: N/A;   TUBAL LIGATION  1999   VAGINAL HYSTERECTOMY  03/13/2012   Procedure: HYSTERECTOMY VAGINAL;  Surgeon: Deanna Scott;  Location: [facility] ORS;  Service: Gynecology;  Laterality: N/A;   HPI:  Deanna Scott is a 53 yo female presenting to ED 1/29 with AMS s/p collapse with unknown LOC. Pt had chest pain and DOE, became bradycardic and arrested. ETT 1/30-2/2. Cardiac MRI 2/4 pending. PMH includes migraines, HTN, chronic dizziness, CSF leak with endoscopic repair 2021, urinary incontinence s/p suburethral sling placement    Assessment / Plan / Recommendation  Clinical Impression  Pt and her family report a mildly decreased vocal quality which they attribute  to overall fatigue. Observed pt with trials of thin liquids, purees, and solids without overt s/s of dysphagia or aspiration. Recommend she continue her current diet without further SLP f/u. SLP Visit Diagnosis: Dysphagia, unspecified (R13.10)    Aspiration Risk  Mild aspiration risk    Diet Recommendation Regular;Thin liquid    Liquid Administration via: Cup;Straw Medication Administration: Whole meds with liquid Supervision: Staff to assist with self feeding Compensations:  Minimize environmental distractions;Slow rate;Small sips/bites Postural Changes: Seated upright at 90 degrees;Remain upright for at least 30 minutes after po intake    Other  Recommendations Oral Care Recommendations: Oral care BID    Recommendations for follow up therapy are one component of a multi-disciplinary discharge planning process, led by the attending physician.  Recommendations may be updated based on patient status, additional functional criteria and insurance authorization.  Follow up Recommendations No SLP follow up      Assistance Recommended at Discharge    Functional Status Assessment Patient has not had a recent decline in their functional status  Frequency and Duration            Prognosis Prognosis for improved oropharyngeal function: Good Barriers to Reach Goals: Time post onset      Swallow Study   General HPI: [name] is a 53 yo female presenting to ED 1/29 with AMS s/p collapse with unknown LOC. Pt had chest pain and DOE, became bradycardic and arrested. ETT 1/30-2/2. Cardiac MRI 2/4 pending. PMH includes migraines, HTN, chronic dizziness, CSF leak with endoscopic repair 2021, urinary incontinence s/p suburethral sling placement Type of Study: Bedside Swallow Evaluation Previous Swallow Assessment: none in chart Diet Prior to this Study: Regular;Thin liquids (Level 0) Temperature Spikes Noted: No Respiratory Status: Nasal cannula History of Recent Intubation: Yes Total duration of intubation (days): 4 days Date extubated: 12/09/23 Behavior/Cognition: Alert;Cooperative;Pleasant mood Oral Cavity Assessment: Within Functional Limits Oral Care Completed by SLP: No Oral Cavity - Dentition: Adequate natural dentition Vision: Functional for self-feeding Self-Feeding Abilities: Needs assist Patient Positioning: Upright in bed Baseline Vocal Quality: Low vocal intensity Volitional Cough: Strong Volitional Swallow: Able to elicit    Oral/Motor/Sensory  Function Overall Oral Motor/Sensory Function: Within functional limits   Ice Chips Ice chips: Not tested   Thin Liquid Thin Liquid: Within functional limits Presentation: Straw    Nectar Thick Nectar Thick Liquid: Not tested   Honey Thick Honey Thick Liquid: Not tested   Puree Puree: Within functional limits Presentation: Spoon   Solid     Solid: Within functional limits      Damien Blumenthal, M.A., CF-SLP Speech Language Pathology, Acute Rehabilitation Services  Secure Chat preferred [phone]  12/11/2023,3:16 PM

## 2023-12-11 NOTE — Assessment & Plan Note (Signed)
Hemoglobin this morning was 9.3 has been trending downward steadily during her ICU admission.  Given concern for volume status we will start iron study workup and monitor closely. -TIBC, ferritin, iron panel -AM CBC

## 2023-12-11 NOTE — Assessment & Plan Note (Deleted)
 VBG 7.58, CO2 18.8, bicarb 17.7. Suspect in setting of pt's new encephalitis vs potential stroke. No hypoxia or respiratory issues on exam. Ammonia normal, LFTs unremarkable, will obtain hepatitis panel. - F/u MRI - Continue to monitor

## 2023-12-11 NOTE — Assessment & Plan Note (Signed)
Plan to recheck CMP morning

## 2023-12-12 DIAGNOSIS — I469 Cardiac arrest, cause unspecified: Secondary | ICD-10-CM | POA: Diagnosis not present

## 2023-12-12 DIAGNOSIS — I4901 Ventricular fibrillation: Secondary | ICD-10-CM | POA: Diagnosis not present

## 2023-12-12 LAB — COMPREHENSIVE METABOLIC PANEL
ALT: 34 U/L (ref 0–44)
AST: 29 U/L (ref 15–41)
Albumin: 2.6 g/dL — ABNORMAL LOW (ref 3.5–5.0)
Alkaline Phosphatase: 75 U/L (ref 38–126)
Anion gap: 9 (ref 5–15)
BUN: 20 mg/dL (ref 6–20)
CO2: 25 mmol/L (ref 22–32)
Calcium: 8.4 mg/dL — ABNORMAL LOW (ref 8.9–10.3)
Chloride: 102 mmol/L (ref 98–111)
Creatinine, Ser: 0.68 mg/dL (ref 0.44–1.00)
GFR, Estimated: >60 mL/min (ref >60)
Glucose, Bld: 112 mg/dL — ABNORMAL HIGH (ref 70–99)
Potassium: 4.2 mmol/L (ref 3.5–5.1)
Sodium: 136 mmol/L (ref 135–145)
Total Bilirubin: 0.8 mg/dL (ref 0.0–1.2)
Total Protein: 5.3 g/dL — ABNORMAL LOW (ref 6.5–8.1)

## 2023-12-12 LAB — GLUCOSE, CAPILLARY
Glucose-Capillary: 103 mg/dL — ABNORMAL HIGH (ref 70–99)
Glucose-Capillary: 116 mg/dL — ABNORMAL HIGH (ref 70–99)
Glucose-Capillary: 117 mg/dL — ABNORMAL HIGH (ref 70–99)
Glucose-Capillary: 117 mg/dL — ABNORMAL HIGH (ref 70–99)
Glucose-Capillary: 130 mg/dL — ABNORMAL HIGH (ref 70–99)
Glucose-Capillary: 222 mg/dL — ABNORMAL HIGH (ref 70–99)
Glucose-Capillary: 260 mg/dL — ABNORMAL HIGH (ref 70–99)

## 2023-12-12 LAB — CBC
HCT: 30 % — ABNORMAL LOW (ref 36.0–46.0)
Hemoglobin: 10.1 g/dL — ABNORMAL LOW (ref 12.0–15.0)
MCH: 30.6 pg (ref 26.0–34.0)
MCHC: 33.7 g/dL (ref 30.0–36.0)
MCV: 90.9 fL (ref 80.0–100.0)
Platelets: 126 10*3/uL — ABNORMAL LOW (ref 150–400)
RBC: 3.3 MIL/uL — ABNORMAL LOW (ref 3.87–5.11)
RDW: 14.6 % (ref 11.5–15.5)
WBC: 23.9 10*3/uL — ABNORMAL HIGH (ref 4.0–10.5)
nRBC: 0.1 % (ref 0.0–0.2)

## 2023-12-12 LAB — COOXEMETRY PANEL
Carboxyhemoglobin: 1.5 % (ref 0.5–1.5)
Carboxyhemoglobin: 2.4 % — ABNORMAL HIGH (ref 0.5–1.5)
Methemoglobin: <0.7 % (ref 0.0–1.5)
Methemoglobin: <0.7 % (ref 0.0–1.5)
O2 Saturation: 54.1 %
O2 Saturation: 70.2 %
Total hemoglobin: 13.4 g/dL (ref 12.0–16.0)
Total hemoglobin: 10.7 g/dL — ABNORMAL LOW (ref 12.0–16.0)

## 2023-12-12 LAB — FERRITIN: Ferritin: 304 ng/mL (ref 11–307)

## 2023-12-12 LAB — MAGNESIUM: Magnesium: 2.2 mg/dL (ref 1.7–2.4)

## 2023-12-12 LAB — IRON AND TIBC
Iron: 34 ug/dL (ref 28–170)
Saturation Ratios: 11 % (ref 10.4–31.8)
TIBC: 302 ug/dL (ref 250–450)
UIBC: 268 ug/dL

## 2023-12-12 MED ORDER — FERROUS SULFATE 325 (65 FE) MG PO TABS
325.0000 mg | ORAL_TABLET | ORAL | Status: DC
  Administered 2023-12-12 – 2023-12-16 (×3): 325 mg via ORAL
  Filled 2023-12-12 (×4): qty 1

## 2023-12-12 MED ORDER — ENSURE ENLIVE PO LIQD
237.0000 mL | Freq: Two times a day (BID) | ORAL | Status: DC
  Administered 2023-12-12 – 2023-12-14 (×3): 237 mL via ORAL

## 2023-12-12 MED ORDER — HEPARIN SODIUM (PORCINE) 5000 UNIT/ML IJ SOLN: 5000.0000 [IU] | Freq: Three times a day (TID) | INTRAMUSCULAR | Status: DC

## 2023-12-12 MED ORDER — MAGIC MOUTHWASH
5.0000 mL | Freq: Three times a day (TID) | ORAL | Status: DC
  Administered 2023-12-12 – 2023-12-17 (×14): 5 mL via ORAL
  Filled 2023-12-12 (×16): qty 5

## 2023-12-12 MED ORDER — HEPARIN SODIUM (PORCINE) 5000 UNIT/ML IJ SOLN
5000.0000 [IU] | Freq: Three times a day (TID) | INTRAMUSCULAR | Status: AC
  Administered 2023-12-12 (×2): 5000 [IU] via SUBCUTANEOUS
  Filled 2023-12-12 (×2): qty 1

## 2023-12-12 NOTE — Progress Notes (Signed)
 Mobility Specialist Progress Note:   12/12/23 1530  Mobility  Activity Transferred to/from [facility];Transferred from chair to bed  Level of Assistance Contact guard assist, steadying assist  Assistive Device Front wheel walker;BSC  Distance Ambulated (ft) 6 ft  Activity Response Tolerated well  Mobility Referral Yes  Mobility visit 1 Mobility  Mobility Specialist Start Time (ACUTE ONLY) 1530  Mobility Specialist Stop Time (ACUTE ONLY) 1545  Mobility Specialist Time Calculation (min) (ACUTE ONLY) 15 min   Pt requesting to transfer to [facility] to void, then to bed despite encouragement to stay in chair. Required contact assist for safety with min verbal cues for hand placement. Pt c/o mild dizziness, BP stable. Back in bed with all needs met.   Therisa Rana Mobility Specialist Please contact via SecureChat or  Rehab office at [phone]

## 2023-12-12 NOTE — Discharge Instructions (Addendum)
 Dear Deanna Scott,  Thank you for letting us  participate in your care. You were hospitalized with cardiac arrest. You received ICU care and multiple medications. You underwent placement of a defibrillator. Please see below for further instructions. You will need close follow up with cardiology.  POST-HOSPITAL & CARE INSTRUCTIONS See more information below about your defibrillator and food pantry resources Please review your updated medication list for updates and changes Go to your follow up appointments (listed below)   DOCTOR'S APPOINTMENT   Future Appointments  Date Time Provider Department Center  12/27/2023  3:20 PM [facility] DEVICE 1 [facility]  01/18/2024 10:40 AM Deanna Ozell Barter, PA-C [facility]  01/28/2024  7:20 AM [facility] DEVICE REMOTES [facility]  03/28/2024  2:45 PM Deanna Chew, MD [facility]  04/28/2024  7:20 AM [facility] DEVICE REMOTES [facility]  07/28/2024  7:20 AM [facility] DEVICE REMOTES [facility]  10/27/2024  7:20 AM [facility] DEVICE REMOTES [facility]  01/26/2025  7:20 AM [facility] DEVICE REMOTES [facility]     Take care and be well!  Family Medicine Teaching Service Inpatient Team [name]  [facility]  [address] [place], [place] [address] [phone]      Food pantry and assistance -[employer]: [address] [place] [address]. Phone [phone]  -[employer]: [address] [place], [place] [address]. [phone].  -[employer]: has the purpose of visiting the sick and shut-ins and provide for needs in the surrounding communities. Call [phone]. Email: [employer]paul[employer]@gmail .com This program provides: Food box for seniors, Financial assistance, Food to meet basic nutritional needs.  -Meals on Wheels with Senior Resources: [facility]  residents age 46 and over who are homebound and unable to obtain and prepare a nutritious meal for themselves are eligible for this service. There may be a waiting list in certain parts of [facility] if the route in that area is full. If you are in [facility] and [place] call [phone] to register. For all other areas call [phone] to register.  -Greater Dietitian: https://findfood.bargaincontractor.[email or web address]  After Your ICD (Implantable Cardiac Defibrillator)   You have a Environmental Manager ICD  ACTIVITY Do not lift your arm above shoulder height for 1 week after your procedure. After 7 days, you may progress as below.  You should remove your sling 24 hours after your procedure, unless otherwise instructed by your provider.     Thursday December 20, 2023  Friday December 21, 2023 Saturday December 22, 2023 Sunday December 23, 2023   Do not lift, push, pull, or carry anything over 10 pounds with the affected arm until 6 weeks (Thursday January 24, 2024 ) after your procedure.   NO DRIVING 6 MONTHS  Ask your healthcare provider when you can go back to work   INCISION/Dressing If you are on a blood thinner such as Coumadin, Xarelto, Eliquis, Plavix , or Pradaxa please confirm with your provider when this should be resumed.   If large square, outer bandage is left in place, this can be removed after 24 hours from your procedure. Do not remove steri-strips or glue as below.   Monitor your defibrillator site for redness, swelling, and drainage. Call the device clinic at [phone] if you experience these symptoms or fever/chills.  If your incision is sealed with Steri-strips or staples, you may shower 7 days after your procedure or when told by your provider. Do not remove the steri-strips or let the shower hit directly  on your site. You may wash around your site with soap and water.    If you were discharged in a sling, please do not wear this during the day more than 48  hours after your surgery unless otherwise instructed. This may increase the risk of stiffness and soreness in your shoulder.   Avoid lotions, ointments, or perfumes over your incision until it is well-healed.  You may use a hot tub or a pool AFTER your wound check appointment if the incision is completely closed.  Your ICD is designed to protect you from life threatening heart rhythms. Because of this, you may receive a shock.   1 shock with no symptoms:  Call the office during business hours. 1 shock with symptoms (chest pain, chest pressure, dizziness, lightheadedness, shortness of breath, overall feeling unwell):  Call 911. If you experience 2 or more shocks in 24 hours:  Call 911. If you receive a shock, you should not drive for 6 months per the [place] DMV IF you receive appropriate therapy from your ICD.   ICD Alerts:  Some alerts are vibratory and others beep. These are NOT emergencies. Please call our office to let us  know. If this occurs at night or on weekends, it can wait until the next business day. Send a remote transmission.  If your device is capable of reading fluid status (for heart failure), you will be offered monthly monitoring to review this with you.   DEVICE MANAGEMENT Remote monitoring is used to monitor your ICD from home. This monitoring is scheduled every 91 days by our office. It allows us  to keep an eye on the functioning of your device to ensure it is working properly. You will routinely see your Electrophysiologist annually (more often if necessary).   You should receive your ID card for your new device in 4-8 weeks. Keep this card with you at all times once received. Consider wearing a medical alert bracelet or necklace.  Your ICD  may be MRI compatible. This will be discussed at your next office visit/wound check.  You should avoid contact with strong electric or magnetic fields.   Do not use amateur (ham) radio equipment or electric (arc) welding torches. MP3  player headphones with magnets should not be used. Some devices are safe to use if held at least 12 inches (30 cm) from your defibrillator. These include power tools, lawn mowers, and speakers. If you are unsure if something is safe to use, ask your health care provider.  When using your cell phone, hold it to the ear that is on the opposite side from the defibrillator. Do not leave your cell phone in a pocket over the defibrillator.  You may safely use electric blankets, heating pads, computers, and microwave ovens.  Call the office right away if: You have chest pain. You feel more than one shock. You feel more short of breath than you have felt before. You feel more light-headed than you have felt before. Your incision starts to open up.  This information is not intended to replace advice given to you by your health care provider. Make sure you discuss any questions you have with your health care provider.

## 2023-12-12 NOTE — Assessment & Plan Note (Addendum)
 Resolved

## 2023-12-12 NOTE — Progress Notes (Addendum)
 Physical Therapy Treatment Patient Details Name: Deanna Scott MRN: [id number] DOB: 1971-01-16 Today's Date: 12/12/2023   History of Present Illness Pt is a 53 yo female presenting with AMS. S/p collapse on 1/29, unknown LOC Found to be +THC. ON 1/30 pt with with chest pain and DOE, became bradycardic and arrested. Cardiac MRI completed 2/4, pending results. PMH: migraines, HTN, chronic dizziness (unclear cause), and CSF leak with endoscopic repair in 2021, urinary incontinence s/p suburethral sling placement    PT Comments  Pt pleasant and agreeable to PT session. She required less physical assistance with functional mobility today. Performed bed mobility with CGA, STS with CGA-minA using RW, and ambulated using RW with CGA. Pt relies on RW for support with OOB mobility. When coming to standing pt demonstrated posterior lean and appeared to push BLE into the side of the bed to assist in powering up. She had 1 posterior LOB that required minA to correct. Pt tolerated ambulating a short distance with VC for sequencing and safe use of an AD. Educated pt/family on BLE HEP she could perform in bed to maintain ROM/strength. Will continue to follow acutely and advance appropriately.    If plan is discharge home, recommend the following: Assistance with cooking/housework;Supervision due to cognitive status;Help with stairs or ramp for entrance;Assist for transportation;A little help with walking and/or transfers;A little help with bathing/dressing/bathroom   Can travel by private vehicle        Equipment Recommendations  Rolling walker (2 wheels)    Recommendations for Other Services       Precautions / Restrictions Precautions Precautions: Fall Precaution Comments: edematous LUE Restrictions Weight Bearing Restrictions Per Provider Order: No     Mobility  Bed Mobility Overal bed mobility: Needs Assistance Bed Mobility: Supine to Sit     Supine to sit: HOB elevated, Contact guard      General bed mobility comments: Pt got up on the L side, brought LE off EOB, and sat up and scooted forward without use of bedrails.    Transfers Overall transfer level: Needs assistance Equipment used: Rolling walker (2 wheels) Transfers: Sit to/from Stand Sit to Stand: Min assist, Contact guard assist           General transfer comment: First rep pt required minA, able to lessen to CGA. Standing from EOB pt tended to use bed to support her posterior, pressing into the side to assist in powering up and lean coming into neutral posture. Cued pt to scoot further to edge, tuck feet underneath her, and increase fwd lean for ease with powering up. Multiple performed today, EOB x3, commode x1, recliner chair x1.    Ambulation/Gait Ambulation/Gait assistance: Contact guard assist Gait Distance (Feet): 45 Feet (2x45, seated break in between to use bathroom) Assistive device: Rolling walker (2 wheels) Gait Pattern/deviations: Step-to pattern, Decreased stride length, Wide base of support, Trunk flexed       General Gait Details: Pt ambulated with short, slow, unequal steps. Pt frequently picked up RW to move, educated her to maintain all 4 points in contact with ground, and push it to glide across the floor. Pt initially with trunk flex, able to correct with VC. Pt chose to side step in/out of the bathroom d/t tight space.   Stairs             Wheelchair Mobility     Tilt Bed    Modified Rankin (Stroke Patients Only)       Balance Overall balance assessment: Needs assistance  Sitting-balance support: Feet supported, No upper extremity supported Sitting balance-Leahy Scale: Good Sitting balance - Comments: Pt sat EOB with supervision.   Standing balance support: Bilateral upper extremity supported, During functional activity, Reliant on assistive device for balance Standing balance-Leahy Scale: Poor Standing balance comment: Pt requires RW for stability in standing and  gait. She maintained prolonged static stance for hygiene to be address following urination. 1 LOB posterior when attempting to lift her gown for without holding onto AD, required minA to regain stability.                            Cognition Arousal: Alert Behavior During Therapy: WFL for tasks assessed/performed Overall Cognitive Status: Within Functional Limits for tasks assessed                                          Exercises General Exercises - Lower Extremity Ankle Circles/Pumps: Supine, Both (Intructed pt to complete 10 reps every hour) Quad Sets: Supine, Both (Intructed pt to complete 10 reps with 5 second hold every hour) Gluteal Sets: Supine, Both (Intructed pt to complete 10 reps with 5 second hold every hour) Heel Slides: Supine, Both (Intructed pt to complete 10 reps every hour) Hip ABduction/ADduction: Supine, Both (Intructed pt to complete 10 reps every hour) Straight Leg Raises: Supine, Both (Intructed pt to complete 10 reps every hour)    General Comments General comments (skin integrity, edema, etc.): Dependent for hygiene following urination.      Pertinent Vitals/Pain Pain Assessment Pain Assessment: No/denies pain    Home Living                          Prior Function            PT Goals (current goals can now be found in the care plan section) Acute Rehab PT Goals Patient Stated Goal: Walk Progress towards PT goals: Progressing toward goals    Frequency    Min 1X/week      PT Plan      Co-evaluation              AM-PAC PT 6 Clicks Mobility   Outcome Measure  Help needed turning from your back to your side while in a flat bed without using bedrails?: A Little Help needed moving from lying on your back to sitting on the side of a flat bed without using bedrails?: A Little Help needed moving to and from a bed to a chair (including a wheelchair)?: A Little Help needed standing up from a chair  using your arms (e.g., wheelchair or bedside chair)?: A Little Help needed to walk in hospital room?: A Little Help needed climbing 3-5 steps with a railing? : A Lot 6 Click Score: 17    End of Session Equipment Utilized During Treatment: Gait belt Activity Tolerance: Patient tolerated treatment well Patient left: in chair;with chair alarm set;with call bell/phone within reach;with family/visitor present Nurse Communication: Mobility status PT Visit Diagnosis: Unsteadiness on feet (R26.81);Muscle weakness (generalized) (M62.81)     Time: 8552-8485 PT Time Calculation (min) (ACUTE ONLY): 27 min  Charges:    $Gait Training: 23-37 mins                       Randall SAUNDERS, PT, DPT Acute  Rehabilitation Services Office: [phone] Secure Chat Preferred  [name] 12/12/2023, 4:03 PM

## 2023-12-12 NOTE — TOC Progression Note (Signed)
 Transition of Care [facility]) - Progression Note    Patient Details  Name: Deanna Scott MRN: [id number] Date of Birth: 1971-01-04  Transition [facility]) CM/SW Contact  Waddell Barnie Rama, RN Phone Number: 12/12/2023, 10:29 AM  Clinical Narrative:    NCM lvm for [facility] inpatient referral for return call regarding referral.  Awaiting call back.        Expected Discharge Plan and Services                                               Social Determinants of Health (SDOH) Interventions SDOH Screenings   Food Insecurity: Food Insecurity Present (01/10/2023)  Housing: Low Risk  (01/10/2023)  Transportation Needs: No Transportation Needs (01/10/2023)  Utilities: Not At Risk (01/10/2023)  Depression (PHQ2-9): Low Risk  (10/19/2023)  Financial Resource Strain: Medium Risk (01/10/2023)  Tobacco Use: Medium Risk (12/05/2023)    Readmission Risk Interventions     No data to display

## 2023-12-12 NOTE — Progress Notes (Addendum)
 Daily Progress Note Intern Pager: [phone]  Patient name: Deanna Scott Medical record number: [id number] Date of birth: [birth year] Age: 53 y.o. Gender: female  Primary Care Provider: Dartha Geralds, DO Consultants: Cardiology, EP Code Status: Full  Pt Overview and Major Events to Date:  1/29: Admitted to FM TS 1/30: Cardiac arrest with ROSC, admitted to ICU 2/4: Patient stable for transfer to floor, FM TS resumes care  Assessment and Plan:  Deanna Scott is a 53 year old female who initially presented with altered mental status and subsequent cardiac arrest with ROSC who was admitted to the ICU where she had cardiogenic shock and electrolyte abnormalities.  She is now in stable condition has been transferred to the floor for continued care.  Cardiology and EP are planning for ICD placement tomorrow. Assessment & Plan Ventricular fibrillation [facility]) Cardiac MRI yesterday showed no evidence of scarring or myocarditis.  EP plans for ICD placement tomorrow since underlying cause still unknown. -Cardiology and electrophysiology following, appreciate recommendations. -IJ line to be removed today per EP's recommendation -Maintain K greater than 4 and mag greater than 2 -Discontinue Solu-Medrol , patient has completed 5-day course. -N.p.o. at midnight for procedure tomorrow. Acute HFrEF (heart failure with reduced ejection fraction) (HCC) Cardiac function improved on cardiac MRI yesterday, acute HFrEF likely secondary to arrest.  Patient also continues to have soft pressures, GDMT not necessary at this time. -Continue losartan  25 mg, consider holding if pressures continue to be soft. Urinary tract infection (Resolved: 12/12/2023) Patient has completed 7-day course of Zosyn  today 2/5.  Resolved Anemia Hemoglobin this morning was 10.1 which represents a significant increase from yesterday.  Iron  panel drawn today was within normal limits, no concern for iron  deficiency.  Will continue to  monitor. Likely anemia of chronic diseae  -AM CBC Transaminitis (Resolved: 12/12/2023) Resolved.  Chronic and Stable Problems:  HTN: Plan as above GERD: Protonix  40 mg  FEN/GI: Regular PPx: Heparin  Dispo:Home pending clinical improvement . Deanna Scott   Subjective:  Patient is awake and alert lying in bed this morning, daughter at bedside.  They report that she is feeling much better than today than she did even yesterday.  Her soreness is improving she has no further episodes of chest pain today.  Objective: Temp:  [97.4 F (36.3 C)-98.3 F (36.8 C)] 98.1 F (36.7 C) (02/05 0723) Pulse Rate:  [71-83] 75 (02/05 0723) Resp:  [17-18] 17 (02/05 0723) BP: (99-143)/(66-86) 125/86 (02/05 0723) SpO2:  [92 %-100 %] 96 % (02/05 0723) Weight:  [94.3 kg] 94.3 kg (02/05 0413) Physical Exam: General: Well-appearing female, no distress Cardiovascular: RRR, no M/R/G Respiratory: CTAB, no increased work of breathing Abdomen: Flat, soft, nontender Extremities: Improved swelling of the left upper extremity, skin is no longer tense and painful.  Pulses palpable in the bilateral upper extremity.  Moves all 4 limbs spontaneously and appropriately  Laboratory: Most recent CBC Lab Results  Component Value Date   WBC 23.9 (H) 12/12/2023   HGB 10.1 (L) 12/12/2023   HCT 30.0 (L) 12/12/2023   MCV 90.9 12/12/2023   PLT 126 (L) 12/12/2023   Most recent BMP    Latest Ref Rng & Units 12/12/2023    4:50 AM  BMP  Glucose 70 - 99 mg/dL 887   BUN 6 - 20 mg/dL 20   Creatinine 9.55 - 1.00 mg/dL 9.31   Sodium 864 - 854 mmol/L 136   Potassium 3.5 - 5.1 mmol/L 4.2   Chloride 98 - 111 mmol/L 102   CO2  22 - 32 mmol/L 25   Calcium  8.9 - 10.3 mg/dL 8.4    Cardiac MRI 1.  Normal left ventricular size, wall thickness and systolic function, EF 67% 2.  Normal right ventricular size and systolic function, EF 68% 3.  No late gadolinium enhancement to suggest myocardial scar 4.  Dilated ascending aorta measuring 40  mm  Cleotilde Lukes, DO 12/12/2023, 7:50 AM  PGY-1, [facility] Family Medicine FPTS Intern pager: [phone], text pages welcome Secure chat group [facility] Teaching Service

## 2023-12-12 NOTE — Assessment & Plan Note (Addendum)
 Cardiac MRI yesterday showed no evidence of scarring or myocarditis.  EP plans for ICD placement tomorrow since underlying cause still unknown. -Cardiology and electrophysiology following, appreciate recommendations. -IJ line to be removed today per EP's recommendation -Maintain K greater than 4 and mag greater than 2 -Discontinue Solu-Medrol , patient has completed 5-day course. -N.p.o. at midnight for procedure tomorrow.

## 2023-12-12 NOTE — Assessment & Plan Note (Addendum)
 Hemoglobin this morning was 10.1 which represents a significant increase from yesterday.  Iron  panel drawn today was within normal limits, no concern for iron  deficiency.  Will continue to monitor. Likely anemia of chronic diseae  -AM CBC

## 2023-12-12 NOTE — Progress Notes (Signed)
 CSW reviewed SDOH needs and added food resources to pts AVS.   Johnnette Gourd, MSW, LCSWA Transitions of Care [phone]

## 2023-12-12 NOTE — Assessment & Plan Note (Addendum)
 Cardiac function improved on cardiac MRI yesterday, acute HFrEF likely secondary to arrest.  Patient also continues to have soft pressures, GDMT not necessary at this time. -Continue losartan  25 mg, consider holding if pressures continue to be soft.

## 2023-12-12 NOTE — Progress Notes (Addendum)
 Patient Name: Deanna Scott Date of Encounter: 12/12/2023  Primary Cardiologist: Annabella Scarce, MD Electrophysiologist: New  Interval Summary   No new complaints this am. Tired.  Vital Signs    Vitals:   12/11/23 2114 12/12/23 0020 12/12/23 0413 12/12/23 0723  BP:  117/84 131/74 125/86  Pulse: 83 75 71 75  Resp:  18 18 17   Temp:  98.2 F (36.8 C) (!) 97.4 F (36.3 C) 98.1 F (36.7 C)  TempSrc:  Oral Oral Oral  SpO2: 100% 94% 94% 96%  Weight:   94.3 kg   Height:        Intake/Output Summary (Last 24 hours) at 12/12/2023 0802 Last data filed at 12/12/2023 0022 Gross per 24 hour  Intake 530 ml  Output 2075 ml  Net -1545 ml   Filed Weights   12/10/23 0500 12/11/23 0445 12/12/23 0413  Weight: 99.1 kg 97.2 kg 94.3 kg    Physical Exam    GEN- The patient is well appearing, alert and oriented x 3 today.   Lungs- Clear to ausculation bilaterally, normal work of breathing Cardiac- Regular rate and rhythm, no murmurs, rubs or gallops GI- soft, NT, ND, + BS Extremities- no clubbing or cyanosis. No edema  Telemetry    NSR 70-80s with rare PVCs (personally reviewed)  Hospital Course    Deanna Scott is a 53 y.o. female with a history of HTN, HLD, CSF leak s/p repair, tobacco use, THC abuse who initially presented to the ED on 12/05/2023 after a syncopal episode associated with bowel and bladder incontinence. At approximately noon on 1/30, the patient had a cardiac arrest. In the hour preceding her arrest it appears that she had progressively slowing sinus rhythm ultimately with sinus bradycardia and frequent ectopy. It appears that one of the short couple PVCs ultimately triggered a polymorphic VT arrest. It is unclear caused her precipitating bradycardia. I suspect this was secondary to something else. Of note, her potassium at the time of cardiac arrest was found to be 2.2 via i-STAT labs and her magnesium  was 1.0. If this is can be believed then profound hypokalemia could  have caused the bradycardia, ectopy and ventricular arrhythmia. Her electrolytes continued to fluctuate wildly throughout that 24-hour period were also associated with profound hyperglycemia. Her LV ejection fraction was found to be mildly decreased post arrest. The patient remains intubated and additional history is likely needed to help elucidate etiology of her arrest. If her arrhythmia can be attributed to a reversible cause such as electrolyte derangements. Then ICD may not be necessarily warranted. However, if no reversible etiology can be found the patient would need secondary prevention ICD prior to discharge.      LHC 12/06/2023 No significant CAD   Echo 12/06/2023 LVEF 45-50%  Assessment & Plan    PMVT/VF cardiac arrest Hypokalemia Hypomagnesemia Hypocalcemia  Acute systolic heart failure Non-ischemic CMP cMRI with normal LVEF and no LGE Likely would benefit from genetic testing pending cMRI No further VT/NSVT off lidocaine . Suspect was electrolyte abnormalities in the setting of her PVCs, but given no clear reversible cause, recommend proceeding with ICD.  Tentatively plan for ICD tomorrow  Explained risks, benefits, and alternatives to ICD implantation, including but not limited to bleeding, infection, damage to heart or lungs, heart attack, stroke, or death.  Pt verbalized understanding and agrees to proceed at next available time    Sepsis Acute respiratory failure with hypercarbia and hypoxia UTI Leukocytosis  Finishing ABx WBC 23.9 but received course of steroids and  is trending down (Peaked at 38.1)  For questions or updates, please contact [facility] Please consult [email or web address] for contact info under Cardiology/STEMI.  Signed, Ozell Prentice Passey, PA-C  12/12/2023, 8:02 AM

## 2023-12-12 NOTE — Assessment & Plan Note (Addendum)
Patient has completed 7-day course of Zosyn today 2/5.  Resolved

## 2023-12-13 ENCOUNTER — Encounter (HOSPITAL_COMMUNITY)
Admission: EM | Disposition: A | Discharge status: Other Acute Inpatient Hospital | Payer: Self-pay | Source: Home / Self Care | Attending: Family Medicine

## 2023-12-13 ENCOUNTER — Encounter (HOSPITAL_COMMUNITY): Payer: Self-pay | Admitting: Certified Registered Nurse Anesthetist

## 2023-12-13 ENCOUNTER — Inpatient Hospital Stay (HOSPITAL_COMMUNITY): Payer: No Typology Code available for payment source

## 2023-12-13 DIAGNOSIS — J9601 Acute respiratory failure with hypoxia: Secondary | ICD-10-CM | POA: Diagnosis not present

## 2023-12-13 DIAGNOSIS — M7989 Other specified soft tissue disorders: Secondary | ICD-10-CM | POA: Diagnosis not present

## 2023-12-13 DIAGNOSIS — I4901 Ventricular fibrillation: Secondary | ICD-10-CM | POA: Diagnosis not present

## 2023-12-13 DIAGNOSIS — I5021 Acute systolic (congestive) heart failure: Secondary | ICD-10-CM | POA: Diagnosis not present

## 2023-12-13 DIAGNOSIS — I469 Cardiac arrest, cause unspecified: Secondary | ICD-10-CM | POA: Diagnosis not present

## 2023-12-13 DIAGNOSIS — I4891 Unspecified atrial fibrillation: Secondary | ICD-10-CM | POA: Diagnosis not present

## 2023-12-13 HISTORY — PX: ICD IMPLANT: EP1208

## 2023-12-13 LAB — CBC
HCT: 32.3 % — ABNORMAL LOW (ref 36.0–46.0)
Hemoglobin: 10.9 g/dL — ABNORMAL LOW (ref 12.0–15.0)
MCH: 30.4 pg (ref 26.0–34.0)
MCHC: 33.7 g/dL (ref 30.0–36.0)
MCV: 90.2 fL (ref 80.0–100.0)
Platelets: 176 K/uL (ref 150–400)
RBC: 3.58 MIL/uL — ABNORMAL LOW (ref 3.87–5.11)
RDW: 14.6 % (ref 11.5–15.5)
WBC: 23.1 K/uL — ABNORMAL HIGH (ref 4.0–10.5)
nRBC: 0 % (ref 0.0–0.2)

## 2023-12-13 LAB — GLUCOSE, CAPILLARY
Glucose-Capillary: 136 mg/dL — ABNORMAL HIGH (ref 70–99)
Glucose-Capillary: 143 mg/dL — ABNORMAL HIGH (ref 70–99)
Glucose-Capillary: 144 mg/dL — ABNORMAL HIGH (ref 70–99)
Glucose-Capillary: 173 mg/dL — ABNORMAL HIGH (ref 70–99)
Glucose-Capillary: 274 mg/dL — ABNORMAL HIGH (ref 70–99)
Glucose-Capillary: 92 mg/dL (ref 70–99)

## 2023-12-13 LAB — BASIC METABOLIC PANEL WITH GFR
Anion gap: 12 (ref 5–15)
BUN: 24 mg/dL — ABNORMAL HIGH (ref 6–20)
CO2: 21 mmol/L — ABNORMAL LOW (ref 22–32)
Calcium: 8.6 mg/dL — ABNORMAL LOW (ref 8.9–10.3)
Chloride: 103 mmol/L (ref 98–111)
Creatinine, Ser: 0.78 mg/dL (ref 0.44–1.00)
GFR, Estimated: >60 mL/min
Glucose, Bld: 147 mg/dL — ABNORMAL HIGH (ref 70–99)
Potassium: 3.9 mmol/L (ref 3.5–5.1)
Sodium: 136 mmol/L (ref 135–145)

## 2023-12-13 LAB — MAGNESIUM: Magnesium: 2.2 mg/dL (ref 1.7–2.4)

## 2023-12-13 LAB — SURGICAL PCR SCREEN
MRSA, PCR: NEGATIVE
Staphylococcus aureus: NEGATIVE

## 2023-12-13 SURGERY — ICD IMPLANT

## 2023-12-13 MED ORDER — CEFAZOLIN SODIUM-DEXTROSE 2-4 GM/100ML-% IV SOLN
2.0000 g | Freq: Four times a day (QID) | INTRAVENOUS | Status: AC
  Administered 2023-12-13 – 2023-12-14 (×3): 2 g via INTRAVENOUS
  Filled 2023-12-13 (×2): qty 100

## 2023-12-13 MED ORDER — SODIUM CHLORIDE 0.9 % IV SOLN
INTRAVENOUS | Status: AC
  Administered 2023-12-13: 80 mg
  Filled 2023-12-13: qty 2

## 2023-12-13 MED ORDER — FENTANYL CITRATE (PF) 100 MCG/2ML IJ SOLN
INTRAMUSCULAR | Status: AC
  Filled 2023-12-13: qty 2

## 2023-12-13 MED ORDER — SODIUM CHLORIDE 0.9 % IV SOLN
80.0000 mg | INTRAVENOUS | Status: AC
  Filled 2023-12-13: qty 2

## 2023-12-13 MED ORDER — ONDANSETRON HCL 4 MG/2ML IJ SOLN
4.0000 mg | Freq: Four times a day (QID) | INTRAMUSCULAR | Status: DC | PRN
  Administered 2023-12-13 – 2023-12-16 (×3): 4 mg via INTRAVENOUS
  Filled 2023-12-13 (×3): qty 2

## 2023-12-13 MED ORDER — CHLORHEXIDINE GLUCONATE 4 % EX SOLN
60.0000 mL | Freq: Once | CUTANEOUS | Status: AC
  Administered 2023-12-13: 4 via TOPICAL

## 2023-12-13 MED ORDER — MIDAZOLAM HCL 5 MG/5ML IJ SOLN
INTRAMUSCULAR | Status: DC | PRN
  Administered 2023-12-13 (×3): 1 mg via INTRAVENOUS

## 2023-12-13 MED ORDER — CHLORHEXIDINE GLUCONATE 4 % EX SOLN
60.0000 mL | Freq: Once | CUTANEOUS | Status: AC
  Administered 2023-12-13: 4 via TOPICAL
  Filled 2023-12-13: qty 60

## 2023-12-13 MED ORDER — MUPIROCIN 2 % EX OINT
1.0000 | TOPICAL_OINTMENT | Freq: Two times a day (BID) | CUTANEOUS | Status: DC
  Administered 2023-12-13 – 2023-12-17 (×6): 1 via NASAL
  Filled 2023-12-13 (×2): qty 22

## 2023-12-13 MED ORDER — MIDAZOLAM HCL 5 MG/5ML IJ SOLN
INTRAMUSCULAR | Status: AC
  Filled 2023-12-13: qty 5

## 2023-12-13 MED ORDER — FENTANYL CITRATE (PF) 100 MCG/2ML IJ SOLN
INTRAMUSCULAR | Status: DC | PRN
  Administered 2023-12-13 (×3): 50 ug via INTRAVENOUS

## 2023-12-13 MED ORDER — CEFAZOLIN SODIUM-DEXTROSE 2-4 GM/100ML-% IV SOLN
INTRAVENOUS | Status: AC
  Administered 2023-12-13: 2 g via INTRAVENOUS
  Filled 2023-12-13: qty 100

## 2023-12-13 MED ORDER — LIDOCAINE HCL 1 % IJ SOLN
INTRAMUSCULAR | Status: AC
  Filled 2023-12-13: qty 60

## 2023-12-13 MED ORDER — CEFAZOLIN SODIUM-DEXTROSE 2-4 GM/100ML-% IV SOLN: 2.0000 g | INTRAVENOUS | Status: AC

## 2023-12-13 MED ORDER — HEPARIN SODIUM (PORCINE) 5000 UNIT/ML IJ SOLN
5000.0000 [IU] | Freq: Three times a day (TID) | INTRAMUSCULAR | Status: DC
  Administered 2023-12-13 – 2023-12-14 (×3): 5000 [IU] via SUBCUTANEOUS
  Filled 2023-12-13 (×3): qty 1

## 2023-12-13 MED ORDER — SODIUM CHLORIDE 0.9 % IV SOLN: INTRAVENOUS | Status: DC

## 2023-12-13 MED ORDER — LIDOCAINE HCL (PF) 1 % IJ SOLN
INTRAMUSCULAR | Status: DC | PRN
  Administered 2023-12-13: 60 mL

## 2023-12-13 MED ORDER — ENOXAPARIN SODIUM 40 MG/0.4ML IJ SOSY: 40.0000 mg | PREFILLED_SYRINGE | INTRAMUSCULAR | Status: DC

## 2023-12-13 MED ORDER — IPRATROPIUM-ALBUTEROL 0.5-2.5 (3) MG/3ML IN SOLN
3.0000 mL | Freq: Four times a day (QID) | RESPIRATORY_TRACT | Status: DC | PRN
  Filled 2023-12-13: qty 3

## 2023-12-13 MED ORDER — HEPARIN (PORCINE) IN NACL 1000-0.9 UT/500ML-% IV SOLN
INTRAVENOUS | Status: DC | PRN
  Administered 2023-12-13: 500 mL

## 2023-12-13 SURGICAL SUPPLY — 8 items
CABLE SURGICAL S-101-97-12 (CABLE) ×1 IMPLANT
ICD VIGILANT VR D232 (Pacemaker) IMPLANT
LEAD RELIANCE 0672 IMPLANT
MAT PREVALON FULL STRYKER (MISCELLANEOUS) IMPLANT
PAD DEFIB RADIO PHYSIO CONN (PAD) ×1 IMPLANT
SHEATH 8FR PRELUDE SNAP 13 (SHEATH) IMPLANT
SHEATH PROBE COVER 6X72 (BAG) IMPLANT
TRAY PACEMAKER INSERTION (PACKS) ×1 IMPLANT

## 2023-12-13 NOTE — H&P (View-Only) (Signed)
 Patient Scott: Deanna Scott Date of Encounter: 12/13/2023  Primary Cardiologist: Annabella Scarce, MD Electrophysiologist: None  Interval Summary   No new complaints this am. Questions re: device implant and post care answered.   Vital Signs    Vitals:   12/13/23 0304 12/13/23 0405 12/13/23 0742 12/13/23 0830  BP: 133/69   98/72  Pulse: 75  72 75  Resp: 16  18 18   Temp: 97.8 F (36.6 C)   97.9 F (36.6 C)  TempSrc: Oral   Oral  SpO2: 94%   95%  Weight:  91.9 kg    Height:        Intake/Output Summary (Last 24 hours) at 12/13/2023 0854 Last data filed at 12/13/2023 0304 Gross per 24 hour  Intake 550 ml  Output 2050 ml  Net -1500 ml   Filed Weights   12/11/23 0445 12/12/23 0413 12/13/23 0405  Weight: 97.2 kg 94.3 kg 91.9 kg    Physical Exam    GEN- The patient is well appearing, alert and oriented x 3 today.   Lungs- Clear to ausculation bilaterally, normal work of breathing Cardiac- Regular rate and rhythm, no murmurs, rubs or gallops GI- soft, NT, ND, + BS Extremities- no clubbing or cyanosis. No edema  Telemetry    NSR 60-80s occasional PVCs (personally reviewed)  Hospital Course    Deanna Scott is a 53 y.o. female with a history of HTN, HLD, CSF leak s/p repair, tobacco use, THC abuse who initially presented to the ED on 12/05/2023 after a syncopal episode associated with bowel and bladder incontinence. At approximately noon on 1/30, the patient had a cardiac arrest. In the hour preceding her arrest it appears that she had progressively slowing sinus rhythm ultimately with sinus bradycardia and frequent ectopy. It appears that one of the short couple PVCs ultimately triggered a polymorphic VT arrest. It is unclear caused her precipitating bradycardia. I suspect this was secondary to something else. Of note, her potassium at the time of cardiac arrest was found to be 2.2 via i-STAT labs and her magnesium  was 1.0. If this is can be believed then profound hypokalemia  could have caused the bradycardia, ectopy and ventricular arrhythmia. Her electrolytes continued to fluctuate wildly throughout that 24-hour period were also associated with profound hyperglycemia. Her LV ejection fraction was found to be mildly decreased post arrest. The patient remains intubated and additional history is likely needed to help elucidate etiology of her arrest. If her arrhythmia can be attributed to a reversible cause such as electrolyte derangements. Then ICD may not be necessarily warranted. However, if no reversible etiology can be found the patient would need secondary prevention ICD prior to discharge.      LHC 12/06/2023 No significant CAD   Echo 12/06/2023 LVEF 45-50%  Assessment & Plan    PMVT/VF cardiac arrest Hypokalemia Hypomagnesemia Hypocalcemia  Acute systolic heart failure Non-ischemic CMP cMRI with normal LVEF and no LGE Likely would benefit from genetic testing pending cMRI No further VT/NSVT off lidocaine . Suspect was electrolyte abnormalities in the setting of her PVCs, but given no clear reversible cause, recommend proceeding with ICD.  Explained risks, benefits, and alternatives to ICD implantation, including but not limited to bleeding, infection, damage to heart or lungs, heart attack, stroke, or death.  Pt verbalized understanding and agrees to proceed if indicated.    Plan to proceed later this am.     Sepsis Acute respiratory failure with hypercarbia and hypoxia UTI Leukocytosis  Finishing ABx WBC 23.9 2/5,  pending this am.  No s/s of ongoing infection.   For questions or updates, please contact [facility] Please consult [email or web address] for contact info under Cardiology/STEMI.  Signed, Ozell Prentice Passey, PA-C  12/13/2023, 8:54 AM

## 2023-12-13 NOTE — Assessment & Plan Note (Addendum)
 Hemoglobin trending up.  Iron  studies done yesterday were normal. -AM CBC

## 2023-12-13 NOTE — Progress Notes (Signed)
 VASCULAR LAB    Left upper extremity venous duplex has been performed.  See CV proc for preliminary results.    Arneda Sappington, RVT 12/13/2023, 9:59 AM

## 2023-12-13 NOTE — Assessment & Plan Note (Addendum)
 LE US  duplex ultrasound shows superficial venous thrombosis but no evidence of DVT.  Recommended withholding ice as this can cause vasoconstriction and increased pain and swelling. -Heat and elevation to reduce swelling -No need for outpatient anticoagulation.

## 2023-12-13 NOTE — Assessment & Plan Note (Addendum)
 CT placement today with electrophysiology, will follow-up their recommendations following procedure. -Cardiology and electrophysiology following, appreciate recommendations. -Maintain K greater than 4 and mag greater than 2 -Holding anticoagulants today pending EP recommendations

## 2023-12-13 NOTE — TOC Progression Note (Addendum)
 Transition of Care [facility]) - Progression Note    Patient Details  Name: Deanna Scott MRN: [id number] Date of Birth: 1970/11/29  [facility] CM/SW Contact  Waddell Barnie Rama, RN Phone Number: 12/13/2023, 1:08 PM  Clinical Narrative:    NCM spoke with Manager at [facility] , she states she forwarded my message to her rep yesterday for her to call this NCM back.  NCM did not receive a call back.  Manager states she will have the rep contact me today.  NCM also spoke with daughter [name],  she states she spoke with rep at [facility], her name is Jenkins and she informed daughter that she will try to push the insurance thru.  NCM spoke with Jenkins says she will start the auth today, if her boss lets her, they do not accept patients on Weekends.  She does not know how long it will take the auth to go thru.        Expected Discharge Plan and Services                                               Social Determinants of Health (SDOH) Interventions SDOH Screenings   Food Insecurity: Food Insecurity Present (01/10/2023)  Housing: Low Risk  (01/10/2023)  Transportation Needs: No Transportation Needs (01/10/2023)  Utilities: Not At Risk (01/10/2023)  Depression (PHQ2-9): Low Risk  (10/19/2023)  Financial Resource Strain: Medium Risk (01/10/2023)  Tobacco Use: Medium Risk (12/05/2023)    Readmission Risk Interventions     No data to display

## 2023-12-13 NOTE — Plan of Care (Signed)
  Problem: Education: Goal: Knowledge of General Education information will improve Description: Including pain rating scale, medication(s)/side effects and non-pharmacologic comfort measures Outcome: Progressing   Problem: Health Behavior/Discharge Planning: Goal: Ability to manage health-related needs will improve Outcome: Progressing   Problem: Clinical Measurements: Goal: Ability to maintain clinical measurements within normal limits will improve Outcome: Progressing Goal: Will remain free from infection Outcome: Progressing Goal: Diagnostic test results will improve Outcome: Progressing Goal: Respiratory complications will improve Outcome: Progressing Goal: Cardiovascular complication will be avoided Outcome: Progressing   Problem: Activity: Goal: Risk for activity intolerance will decrease Outcome: Progressing   Problem: Nutrition: Goal: Adequate nutrition will be maintained Outcome: Progressing   Problem: Coping: Goal: Level of anxiety will decrease Outcome: Progressing   Problem: Elimination: Goal: Will not experience complications related to bowel motility Outcome: Progressing Goal: Will not experience complications related to urinary retention Outcome: Progressing   Problem: Pain Managment: Goal: General experience of comfort will improve and/or be controlled Outcome: Progressing   Problem: Safety: Goal: Ability to remain free from injury will improve Outcome: Progressing   Problem: Skin Integrity: Goal: Risk for impaired skin integrity will decrease Outcome: Progressing   Problem: Education: Goal: Understanding of CV disease, CV risk reduction, and recovery process will improve Outcome: Progressing Goal: Individualized Educational Video(s) Outcome: Progressing   Problem: Activity: Goal: Ability to return to baseline activity level will improve Outcome: Progressing   Problem: Cardiovascular: Goal: Ability to achieve and maintain adequate  cardiovascular perfusion will improve Outcome: Progressing Goal: Vascular access site(s) Level 0-1 will be maintained Outcome: Progressing   Problem: Health Behavior/Discharge Planning: Goal: Ability to safely manage health-related needs after discharge will improve Outcome: Progressing   Problem: Safety: Goal: Non-violent Restraint(s) Outcome: Progressing   Problem: Education: Goal: Ability to describe self-care measures that may prevent or decrease complications (Diabetes Survival Skills Education) will improve Outcome: Progressing Goal: Individualized Educational Video(s) Outcome: Progressing   Problem: Coping: Goal: Ability to adjust to condition or change in health will improve Outcome: Progressing   Problem: Fluid Volume: Goal: Ability to maintain a balanced intake and output will improve Outcome: Progressing   Problem: Health Behavior/Discharge Planning: Goal: Ability to identify and utilize available resources and services will improve Outcome: Progressing Goal: Ability to manage health-related needs will improve Outcome: Progressing   Problem: Metabolic: Goal: Ability to maintain appropriate glucose levels will improve Outcome: Progressing   Problem: Nutritional: Goal: Maintenance of adequate nutrition will improve Outcome: Progressing Goal: Progress toward achieving an optimal weight will improve Outcome: Progressing   Problem: Skin Integrity: Goal: Risk for impaired skin integrity will decrease Outcome: Progressing   Problem: Tissue Perfusion: Goal: Adequacy of tissue perfusion will improve Outcome: Progressing

## 2023-12-13 NOTE — Assessment & Plan Note (Deleted)
 Afib in EMS EKG, converted to NSR here. No therapeutic anticoagulation indicated at this time. -Monitor on tele -Echo

## 2023-12-13 NOTE — Progress Notes (Signed)
 Patient Scott: Deanna Scott Date of Encounter: 12/13/2023  Primary Cardiologist: Annabella Scarce, MD Electrophysiologist: None  Interval Summary   No new complaints this am. Questions re: device implant and post care answered.   Vital Signs    Vitals:   12/13/23 0304 12/13/23 0405 12/13/23 0742 12/13/23 0830  BP: 133/69   98/72  Pulse: 75  72 75  Resp: 16  18 18   Temp: 97.8 F (36.6 C)   97.9 F (36.6 C)  TempSrc: Oral   Oral  SpO2: 94%   95%  Weight:  91.9 kg    Height:        Intake/Output Summary (Last 24 hours) at 12/13/2023 0854 Last data filed at 12/13/2023 0304 Gross per 24 hour  Intake 550 ml  Output 2050 ml  Net -1500 ml   Filed Weights   12/11/23 0445 12/12/23 0413 12/13/23 0405  Weight: 97.2 kg 94.3 kg 91.9 kg    Physical Exam    GEN- The patient is well appearing, alert and oriented x 3 today.   Lungs- Clear to ausculation bilaterally, normal work of breathing Cardiac- Regular rate and rhythm, no murmurs, rubs or gallops GI- soft, NT, ND, + BS Extremities- no clubbing or cyanosis. No edema  Telemetry    NSR 60-80s occasional PVCs (personally reviewed)  Hospital Course    Deanna Scott is a 53 y.o. female with a history of HTN, HLD, CSF leak s/p repair, tobacco use, THC abuse who initially presented to the ED on 12/05/2023 after a syncopal episode associated with bowel and bladder incontinence. At approximately noon on 1/30, the patient had a cardiac arrest. In the hour preceding her arrest it appears that she had progressively slowing sinus rhythm ultimately with sinus bradycardia and frequent ectopy. It appears that one of the short couple PVCs ultimately triggered a polymorphic VT arrest. It is unclear caused her precipitating bradycardia. I suspect this was secondary to something else. Of note, her potassium at the time of cardiac arrest was found to be 2.2 via i-STAT labs and her magnesium  was 1.0. If this is can be believed then profound hypokalemia  could have caused the bradycardia, ectopy and ventricular arrhythmia. Her electrolytes continued to fluctuate wildly throughout that 24-hour period were also associated with profound hyperglycemia. Her LV ejection fraction was found to be mildly decreased post arrest. The patient remains intubated and additional history is likely needed to help elucidate etiology of her arrest. If her arrhythmia can be attributed to a reversible cause such as electrolyte derangements. Then ICD may not be necessarily warranted. However, if no reversible etiology can be found the patient would need secondary prevention ICD prior to discharge.      LHC 12/06/2023 No significant CAD   Echo 12/06/2023 LVEF 45-50%  Assessment & Plan    PMVT/VF cardiac arrest Hypokalemia Hypomagnesemia Hypocalcemia  Acute systolic heart failure Non-ischemic CMP cMRI with normal LVEF and no LGE Likely would benefit from genetic testing pending cMRI No further VT/NSVT off lidocaine . Suspect was electrolyte abnormalities in the setting of her PVCs, but given no clear reversible cause, recommend proceeding with ICD.  Explained risks, benefits, and alternatives to ICD implantation, including but not limited to bleeding, infection, damage to heart or lungs, heart attack, stroke, or death.  Pt verbalized understanding and agrees to proceed if indicated.    Plan to proceed later this am.     Sepsis Acute respiratory failure with hypercarbia and hypoxia UTI Leukocytosis  Finishing ABx WBC 23.9 2/5,  pending this am.  No s/s of ongoing infection.   For questions or updates, please contact [facility] Please consult [email or web address] for contact info under Cardiology/STEMI.  Signed, Ozell Prentice Passey, PA-C  12/13/2023, 8:54 AM

## 2023-12-13 NOTE — Progress Notes (Signed)
 Occupational Therapy Treatment Patient Details Name: Deanna Scott MRN: [id number] DOB: Jul 23, 1971 Today's Date: 12/13/2023   History of present illness Pt is a 53 yo female presenting with AMS. S/p collapse on 1/29, unknown LOC Found to be +THC. ON 1/30 pt with with chest pain and DOE, became bradycardic and arrested. Cardiac MRI showed dilated ascending aorta. Pending ICD placement 2/6. PMH: migraines, HTN, chronic dizziness (unclear cause), and CSF leak with endoscopic repair in 2021, urinary incontinence s/p suburethral sling placement   OT comments  Pt making excellent progress towards OT goals. Pt able to mobilize to/from bathroom using RW with CGA and no overt LOB when using this DME. Pt continues to require Min-Mod A for LB ADLs due to deficits in flexibility, endurance and continued L UE edema noted. Pt's daughter at bedside and supportive. Continue to feel pt would progress quickly with intensive rehab services at [facility].      If plan is discharge home, recommend the following:  A little help with walking and/or transfers;A lot of help with bathing/dressing/bathroom;Assistance with cooking/housework   Equipment Recommendations  Other (comment) (TBD pending progress. RW vs Rollator once educated)    Recommendations for Other Services      Precautions / Restrictions Precautions Precautions: Fall Precaution Comments: edematous LUE Restrictions Weight Bearing Restrictions Per Provider Order: No       Mobility Bed Mobility Overal bed mobility: Needs Assistance Bed Mobility: Supine to Sit, Sit to Supine     Supine to sit: Supervision, HOB elevated Sit to supine: Supervision        Transfers Overall transfer level: Needs assistance Equipment used: Rolling walker (2 wheels) Transfers: Sit to/from Stand Sit to Stand: Contact guard assist                 Balance Overall balance assessment: Needs assistance Sitting-balance support: Feet supported, No upper extremity  supported Sitting balance-Leahy Scale: Good     Standing balance support: Bilateral upper extremity supported, During functional activity, Reliant on assistive device for balance Standing balance-Leahy Scale: Fair Standing balance comment: able to stand at sink without UE support, BUE beneficial for mobility stability                           ADL either performed or assessed with clinical judgement   ADL Overall ADL's : Needs assistance/impaired     Grooming: Contact guard assist;Standing;Wash/dry face;Wash/dry hands Grooming Details (indicate cue type and reason): standing at sink, no LOB             Lower Body Dressing: Moderate assistance;Sitting/lateral leans;Sit to/from stand Lower Body Dressing Details (indicate cue type and reason): able to don R sock briefly bringing LE up to self but fatigued and required assist to adjust this sock and don L sock Toilet Transfer: Contact guard assist;Ambulation;Rolling walker (2 wheels) Toilet Transfer Details (indicate cue type and reason): cues for RW mgmt, assist for IV pole. able to mobilize to/from bathroom without LOB using RW Toileting- Clothing Manipulation and Hygiene: Minimal assistance;Sitting/lateral lean;Sit to/from stand Toileting - Clothing Manipulation Details (indicate cue type and reason): light Min A for clothing mgmt during toileting. able to perform hygiene in standing     Functional mobility during ADLs: Contact guard assist;Rolling walker (2 wheels) General ADL Comments: much improved mobility and stability when using RW. Reinforced AROM of LUE to decrease edema, potential trial of Rollator vs no AD based on ease with RW.    Extremity/Trunk Assessment  Upper Extremity Assessment Upper Extremity Assessment: Right hand dominant;LUE deficits/detail LUE Deficits / Details: edematous (from infiltrated IV per daughter) through entire UE. difficulty making grasp and impaired ROM due to swelling. provided squeeze  ball, encouraged AROM of this UE within tolerance and elevation LUE Coordination: decreased fine motor   Lower Extremity Assessment Lower Extremity Assessment: Defer to PT evaluation        Vision   Vision Assessment?: No apparent visual deficits   Perception     Praxis      Cognition Arousal: Alert Behavior During Therapy: WFL for tasks assessed/performed Overall Cognitive Status: Within Functional Limits for tasks assessed                                          Exercises      Shoulder Instructions       General Comments Daughter present    Pertinent Vitals/ Pain       Pain Assessment Pain Assessment: Faces Faces Pain Scale: Hurts a little bit Pain Location: chest Pain Descriptors / Indicators: Sore (from hx of CPR per pt) Pain Intervention(s): Monitored during session, Limited activity within patient's tolerance  Home Living                                          Prior Functioning/Environment              Frequency  Min 1X/week        Progress Toward Goals  OT Goals(current goals can now be found in the care plan section)  Progress towards OT goals: Progressing toward goals  Acute Rehab OT Goals Patient Stated Goal: regain independence OT Goal Formulation: With patient/family Time For Goal Achievement: 12/25/23 Potential to Achieve Goals: Good ADL Goals Pt Will Perform Grooming: with min assist;standing Pt Will Perform Lower Body Bathing: with min assist;sitting/lateral leans;sit to/from stand Pt Will Transfer to Toilet: with min assist;stand pivot transfer;bedside commode Pt/caregiver will Perform Home Exercise Program: Increased strength;Both right and left upper extremity;Independently;With written HEP provided  Plan      Co-evaluation                 AM-PAC OT 6 Clicks Daily Activity     Outcome Measure   Help from another person eating meals?: A Little Help from another person taking  care of personal grooming?: A Little Help from another person toileting, which includes using toliet, bedpan, or urinal?: A Little Help from another person bathing (including washing, rinsing, drying)?: A Little Help from another person to put on and taking off regular upper body clothing?: A Little Help from another person to put on and taking off regular lower body clothing?: A Little 6 Click Score: 18    End of Session Equipment Utilized During Treatment: Gait belt;Rolling walker (2 wheels)  OT Visit Diagnosis: Unsteadiness on feet (R26.81);Other abnormalities of gait and mobility (R26.89);Muscle weakness (generalized) (M62.81)   Activity Tolerance Patient tolerated treatment well   Patient Left in bed;with call bell/phone within reach;with bed alarm set;with family/visitor present   Nurse Communication Mobility status        Time: 9191-9178 OT Time Calculation (min): 13 min  Charges: OT General Charges $OT Visit: 1 Visit OT Treatments $Self Care/Home Management : 8-22 mins  Mliss NOVAK, OTR/L  Acute Rehab Services Office: [phone]   Mliss Getting 12/13/2023, 8:38 AM

## 2023-12-13 NOTE — Assessment & Plan Note (Addendum)
 Appears resolved, will continue to monitor pressures, has otherwise been stable. -Discontinue losartan  25 mg

## 2023-12-13 NOTE — Interval H&P Note (Signed)
 History and Physical Interval Note:  12/13/2023 10:56 AM  Deanna Scott  has presented today for surgery, with the diagnosis of cardiac arrest.  The various methods of treatment have been discussed with the patient and family. After consideration of risks, benefits and other options for treatment, the patient has consented to  Procedure(s): ICD IMPLANT (N/A) as a surgical intervention.  The patient's history has been reviewed, patient examined, no change in status, stable for surgery.  I have reviewed the patient's chart and labs.  Questions were answered to the patient's satisfaction.     Deanna Scott

## 2023-12-13 NOTE — Progress Notes (Signed)
     Daily Progress Note Intern Pager: [phone]  Patient name: Katricia Prehn Medical record number: [id number] Date of birth: 01/04/1971 Age: 53 y.o. Gender: female  Primary Care Provider: Dartha Geralds, DO Consultants: Cardiology, EP Code Status: Full  Pt Overview and Major Events to Date:  1/29: Admitted to FMTS 1/30: Cardiac arrest with ROSC, admitted to ICU 2/4: Patient stable for transfer to floor, FM TS resumes care 2/6: ICD placement  Assessment and Plan:  Patient is a 53 year old female who presented initially with AMS and then had ventricular fibrillation leading to cardiac arrest leading to his stay in the ICU.  She is now stable for the floor and will be undergoing ICD placement given her uncertain etiology of V-fib.  Patient could be ready for discharge to CIR as soon as tomorrow. Assessment & Plan Ventricular fibrillation [facility]) CT placement today with electrophysiology, will follow-up their recommendations following procedure. -Cardiology and electrophysiology following, appreciate recommendations. -Maintain K greater than 4 and mag greater than 2 -Holding anticoagulants today pending EP recommendations Acute HFrEF (heart failure with reduced ejection fraction) (HCC) Appears resolved, will continue to monitor pressures, has otherwise been stable. -Discontinue losartan  25 mg Anemia Hemoglobin trending up.  Iron  studies done yesterday were normal. -AM CBC Left upper extremity swelling LE US  duplex ultrasound shows superficial venous thrombosis but no evidence of DVT.  Recommended withholding ice as this can cause vasoconstriction and increased pain and swelling. -Heat and elevation to reduce swelling -No need for outpatient anticoagulation.  Chronic and Stable Problems:  Hypertension: Plan as above GERD: Protonix  40 mg  FEN/GI: N.p.o. until after procedure PPx: Holding heparin  until after procedure Dispo:CIR tomorrow.    Subjective:  Is feeling well this  morning, daughter is at bedside and also feels she is improving.  Patient and daughter report that she was able to walk to the bathroom on her own with walker assistance.  Objective: Temp:  [97.6 F (36.4 C)-98.1 F (36.7 C)] 97.8 F (36.6 C) (02/06 0304) Pulse Rate:  [70-88] 72 (02/06 0742) Resp:  [16-20] 18 (02/06 0742) BP: (98-133)/(58-94) 133/69 (02/06 0304) SpO2:  [93 %-96 %] 94 % (02/06 0304) Weight:  [91.9 kg] 91.9 kg (02/06 0405) Physical Exam: General: Well-appearing, well-nourished no distress Cardiovascular: RRR, no M/R/G. Respiratory: CTAB, no increased work of breathing. Abdomen: Flat, soft, nontender. Extremities: Continued 3+ swelling of the left upper extremity, welling today is less tense and more easily compressible.  No swelling of the bilateral lower extremity.  Laboratory: Most recent CBC Lab Results  Component Value Date   WBC 23.9 (H) 12/12/2023   HGB 10.1 (L) 12/12/2023   HCT 30.0 (L) 12/12/2023   MCV 90.9 12/12/2023   PLT 126 (L) 12/12/2023   Most recent BMP    Latest Ref Rng & Units 12/12/2023    4:50 AM  BMP  Glucose 70 - 99 mg/dL 887   BUN 6 - 20 mg/dL 20   Creatinine 9.55 - 1.00 mg/dL 9.31   Sodium 864 - 854 mmol/L 136   Potassium 3.5 - 5.1 mmol/L 4.2   Chloride 98 - 111 mmol/L 102   CO2 22 - 32 mmol/L 25   Calcium  8.9 - 10.3 mg/dL 8.4     Cleotilde Lukes, DO 12/13/2023, 8:09 AM  PGY-1, [facility] FPTS Intern pager: [phone], text pages welcome Secure chat group [facility] Teaching Service

## 2023-12-13 NOTE — Progress Notes (Signed)
 PT Cancellation Note  Patient Details Name: Deanna Scott MRN: [id number] DOB: 04/21/1971   Cancelled Treatment:    Reason Eval/Treat Not Completed: Patient declined, no reason specified (Pt politely refused PT session citing 7/10 chest pain.) Will follow-up as schedule permits.   Randall SAUNDERS, PT, DPT Acute Rehabilitation Services Office: [phone] Secure Chat Preferred    [name] 12/13/2023, 3:41 PM

## 2023-12-13 NOTE — Plan of Care (Signed)
 FMTS Interim Progress Note  S: Saw patient for postop rounding, she was awake sitting up in bed and in good spirits.  Her daughter was present as well and had brought her [employer].  She denies any pain or discomfort related to her ICD placement and states that she is feeling good.  O: BP 116/61 (BP Location: Right Arm)   Pulse 81   Temp 97.9 F (36.6 C) (Oral)   Resp 19   Ht 5' 2 (1.575 m)   Wt 91.9 kg   LMP 03/02/2012   SpO2 94%   BMI 37.06 kg/m   General: A&O, NAD Cardiac: RRR, no m/r/g Respiratory: CTAB, normal WOB, no w/c/r GI: Soft, NTTP, non-distended  Extremities: NTTP, left arm and offloading splinting with mildly decreased peripheral edema compared to this morning's exam.  A/P: Overall patient tolerated her procedure well and has no complaints.  Currently her pain is well-controlled, however if this were to change, depending on location of pain, would recommend assessment prior to administration of pain medications.  She does have as needed Tylenol  on board, but it would not be inappropriate to consider a small dose of oxycodone  should her pain increase.  Cleotilde Lukes, DO 12/13/2023, 1:29 PM PGY-1, [facility] Family Medicine Service pager [phone]

## 2023-12-14 ENCOUNTER — Encounter (HOSPITAL_COMMUNITY): Payer: Self-pay | Admitting: Cardiology

## 2023-12-14 ENCOUNTER — Inpatient Hospital Stay (HOSPITAL_COMMUNITY): Payer: No Typology Code available for payment source

## 2023-12-14 DIAGNOSIS — M7989 Other specified soft tissue disorders: Secondary | ICD-10-CM | POA: Diagnosis not present

## 2023-12-14 DIAGNOSIS — I469 Cardiac arrest, cause unspecified: Secondary | ICD-10-CM | POA: Diagnosis not present

## 2023-12-14 LAB — CBC
HCT: 31.7 % — ABNORMAL LOW (ref 36.0–46.0)
Hemoglobin: 10.3 g/dL — ABNORMAL LOW (ref 12.0–15.0)
MCH: 29.5 pg (ref 26.0–34.0)
MCHC: 32.5 g/dL (ref 30.0–36.0)
MCV: 90.8 fL (ref 80.0–100.0)
Platelets: 188 10*3/uL (ref 150–400)
RBC: 3.49 MIL/uL — ABNORMAL LOW (ref 3.87–5.11)
RDW: 14.8 % (ref 11.5–15.5)
WBC: 20.2 10*3/uL — ABNORMAL HIGH (ref 4.0–10.5)
nRBC: 0 % (ref 0.0–0.2)

## 2023-12-14 LAB — GLUCOSE, CAPILLARY
Glucose-Capillary: 132 mg/dL — ABNORMAL HIGH (ref 70–99)
Glucose-Capillary: 139 mg/dL — ABNORMAL HIGH (ref 70–99)
Glucose-Capillary: 140 mg/dL — ABNORMAL HIGH (ref 70–99)
Glucose-Capillary: 151 mg/dL — ABNORMAL HIGH (ref 70–99)
Glucose-Capillary: 191 mg/dL — ABNORMAL HIGH (ref 70–99)
Glucose-Capillary: 214 mg/dL — ABNORMAL HIGH (ref 70–99)

## 2023-12-14 NOTE — Progress Notes (Addendum)
 Mobility Specialist Progress Note:   12/14/23 1158  Mobility  Activity Ambulated with assistance in room  Level of Assistance Contact guard assist, steadying assist  Assistive Device Other (Comment) (HHA)  Distance Ambulated (ft) 40 ft  Activity Response Tolerated well  Mobility Referral Yes  Mobility visit 1 Mobility  Mobility Specialist Start Time (ACUTE ONLY) [id number]  Mobility Specialist Stop Time (ACUTE ONLY) 0935  Mobility Specialist Time Calculation (min) (ACUTE ONLY) 10 min   Pt received in bed agreeable to mobility. No c/o throughout. Ambulated w/ HHA d/t WB restrictions on LUE. Performed to laps in room. Situated back in bed w/ call bell and personal belongings in reach. All needs met. Family in room.  Thersia Minder Mobility Specialist  Please contact vis Secure Chat or  Rehab Office [phone]

## 2023-12-14 NOTE — Assessment & Plan Note (Addendum)
 Stable.  Will continue to monitor while patient is in the hospital. -Lab holiday tomorrow -Recommend follow-up outpatient.

## 2023-12-14 NOTE — Assessment & Plan Note (Signed)
 Tolerated ICD placement well yesterday.  Will await further recommendations from EP but at this time patient is stable. -Cardiology and electrophysiology following, appreciate recommendations. -Maintain K greater than 4 and mag greater than 2 -Restarted anticoagulants

## 2023-12-14 NOTE — Assessment & Plan Note (Signed)
 Stable.  Plan as below -Heat and elevation to reduce swelling -No need for outpatient anticoagulation.

## 2023-12-14 NOTE — TOC Progression Note (Signed)
 Transition of Care [facility]) - Progression Note    Patient Details  Name: Deanna Scott MRN: [id number] Date of Birth: Mar 17, 1971  Transition of Care [facility]) CM/SW Contact  Waddell Barnie Rama, RN Phone Number: 12/14/2023, 4:52 PM  Clinical Narrative:    Per Jenkins with [facility] , patient has been approved for inpatient rehab, plan to transfer on Monday,  NCM made MD aware and Staff RN.        Expected Discharge Plan and Services                                               Social Determinants of Health (SDOH) Interventions SDOH Screenings   Food Insecurity: Food Insecurity Present (01/10/2023)  Housing: Low Risk  (01/10/2023)  Transportation Needs: No Transportation Needs (01/10/2023)  Utilities: Not At Risk (01/10/2023)  Depression (PHQ2-9): Low Risk  (10/19/2023)  Financial Resource Strain: Medium Risk (01/10/2023)  Tobacco Use: Medium Risk (12/05/2023)    Readmission Risk Interventions     No data to display

## 2023-12-14 NOTE — Progress Notes (Signed)
     Deanna Progress Note Intern Pager: [phone]  Patient name: Deanna Scott Medical record number: [id number] Date of birth: Mar 15, 1971 Age: 53 y.o. Gender: female  Primary Care Provider: Dartha Geralds, DO Consultants: Cardiology, EP Code Status: Full  Pt Overview and Major Events to Date:  1/29: Admitted to FM TS 1/30: Cardiac arrest with ROSC, admitted to ICU 2/4: Patient stable for transfer to floor, FM TS resume his care 2/6: ICD placement  Assessment and Plan:  Patient is a 53 year old female who presented initially with altered mental status and then had ventricular fibrillation leading to cardiac arrest, she had a brief stay in the ICU and is now stable on the general medicine floor.  ICD was placed successfully yesterday, this time patient is medically stable for discharge and CIR authorization process been started. Assessment & Plan Ventricular fibrillation (HCC) Tolerated ICD placement well yesterday.  Will await further recommendations from EP but at this time patient is stable. -Cardiology and electrophysiology following, appreciate recommendations. -Maintain K greater than 4 and mag greater than 2 -Restarted anticoagulants Anemia Stable.  Will continue to monitor while patient is in the hospital. -Lab holiday tomorrow -Recommend follow-up outpatient. Left upper extremity swelling Stable.  Plan as below -Heat and elevation to reduce swelling -No need for outpatient anticoagulation.  Chronic and Stable Problems:  Stress-induced acute heart failure: LVEF normalized on cMRI.  Pressure stable, no further treatment needed. Hypertension: Pressures have been acceptable without medication, consider restarting home meds as needed GERD: Protonix  40 mg  FEN/GI: Regular diet PPx: Heparin  Dispo:CIR  pending authorization . Deanna Scott   Subjective:  Patient is awake resting comfortably in bed on exam this morning.  She denies any significant pain associated with her ICD site.   Daughter was present over the phone, they asked if they would need to continue to check her sugars outpatient, I advised them that we would recommend she follow-up with her primary doctor but would not need to track her sugars in the interim.  Objective: Temp:  [97.7 F (36.5 C)-98.1 F (36.7 C)] 98.1 F (36.7 C) (02/07 0447) Pulse Rate:  [72-96] 78 (02/07 0447) Resp:  [16-30] 19 (02/07 0447) BP: (98-137)/(46-91) 125/79 (02/07 0447) SpO2:  [94 %-100 %] 95 % (02/07 0447) Weight:  [94 kg] 94 kg (02/07 0447) Physical Exam: General: Ill-appearing, well-nourished female no distress Cardiovascular: RRR, no M/R/G Respiratory: CTAB, no increased work of breathing Abdomen: Flat, soft, nontender Extremities: 2+ pitting edema in the left upper extremity, limited to the hand below the wrist.  Bilateral upper extremity peripheral pulses palpable today.  Laboratory: Most recent CBC Lab Results  Component Value Date   WBC 20.2 (H) 12/14/2023   HGB 10.3 (L) 12/14/2023   HCT 31.7 (L) 12/14/2023   MCV 90.8 12/14/2023   PLT 188 12/14/2023   Most recent BMP    Latest Ref Rng & Units 12/13/2023    8:45 AM  BMP  Glucose 70 - 99 mg/dL 852   BUN 6 - 20 mg/dL 24   Creatinine 9.55 - 1.00 mg/dL 9.21   Sodium 864 - 854 mmol/L 136   Potassium 3.5 - 5.1 mmol/L 3.9   Chloride 98 - 111 mmol/L 103   CO2 22 - 32 mmol/L 21   Calcium  8.9 - 10.3 mg/dL 8.6     Deanna Lukes, DO 12/14/2023, 7:15 AM  PGY-1,  Family Medicine FPTS Intern pager: [phone], text pages welcome Secure chat group [facility] Teaching Service

## 2023-12-14 NOTE — Progress Notes (Signed)
 Physical Therapy Treatment Patient Details Name: Deanna Scott MRN: [id number] DOB: 1971/03/22 Today's Date: 12/14/2023   History of Present Illness Pt is a 53 yo female presenting with AMS. S/p collapse on 1/29, unknown LOC Found to be +THC. ON 1/30 pt with with chest pain and DOE, became bradycardic and arrested. Cardiac MRI showed dilated ascending aorta. ICD placement 2/6. PMH: migraines, HTN, chronic dizziness (unclear cause), and CSF leak with endoscopic repair in 2021, urinary incontinence s/p suburethral sling placement    PT Comments  Today's session focused on gait training. Pt engaged in multiple short distance ambulation bouts with RUE HHA, CGA, and a chair follow. She required a seated rest break between each gait attempt d/t fatigue and dizziness. Pt reported a 7/10 on the modified RPE scale following ambulation. Reviewed ICD activity restrictions and provided pt with handout. Pt will continue to benefit from acute skilled PT to increase her independence with functional mobility and safety prior to discharge.      If plan is discharge home, recommend the following: Assistance with cooking/housework;Help with stairs or ramp for entrance;Assist for transportation;A little help with walking and/or transfers;A little help with bathing/dressing/bathroom   Can travel by private vehicle        Equipment Recommendations  Rolling walker (2 wheels)    Recommendations for Other Services       Precautions / Restrictions Precautions Precautions: Fall;ICD/Pacemaker Restrictions Weight Bearing Restrictions Per Provider Order: No     Mobility  Bed Mobility Overal bed mobility: Needs Assistance Bed Mobility: Sit to Supine       Sit to supine: Supervision   General bed mobility comments: Pt greeted in recliner chair at start of session.    Transfers Overall transfer level: Needs assistance Equipment used: 1 person hand held assist Transfers: Sit to/from Stand Sit to Stand:  Contact guard assist           General transfer comment: STS x5 reps from recliner chair and EOB.    Ambulation/Gait Ambulation/Gait assistance: Contact guard assist Gait Distance (Feet): 40 Feet (2x40; 1x30; seated rest break between each bout) Assistive device: 1 person hand held assist Gait Pattern/deviations: Step-through pattern, Decreased stride length, Wide base of support, Drifts right/left   Gait velocity interpretation: <1.31 ft/sec, indicative of household ambulator   General Gait Details: Pt ambulated with HHA provided to RUE and CGA at trunk on gait belt. Pt took short steps, swaying R/L slightly throughout gait, but no overt LOB. Pt required a seated rest break following each bout d/t fatigue and dizziness.   Stairs             Wheelchair Mobility     Tilt Bed    Modified Rankin (Stroke Patients Only)       Balance Overall balance assessment: Needs assistance Sitting-balance support: Feet supported, Single extremity supported Sitting balance-Leahy Scale: Good Sitting balance - Comments: Pt scooted to edge of recliner chair with unilateral UE support and supervision. Pt sat EOB with supervision.   Standing balance support: Single extremity supported, During functional activity Standing balance-Leahy Scale: Poor Standing balance comment: Dependent on external support for stability. Unsteady when attempting to put on robe or pull up pants. Sways while ambulating. No overt LOB.                            Cognition Arousal: Alert Behavior During Therapy: WFL for tasks assessed/performed Overall Cognitive Status: Within Functional Limits for tasks assessed  General Comments: Pt requires VC to maintain ICD precautions. She is quick to start initiating movements without consideration for the task and ROM required.        Exercises      General Comments General comments (skin integrity, edema,  etc.): VSS on RA.      Pertinent Vitals/Pain Pain Assessment Pain Assessment: 0-10 Pain Score: 4  Pain Location: Chest Pain Descriptors / Indicators: Sore, Discomfort, Aching Pain Intervention(s): Monitored during session, Repositioned    Home Living                          Prior Function            PT Goals (current goals can now be found in the care plan section) Acute Rehab PT Goals Patient Stated Goal: Go outside and get some fresh air. Progress towards PT goals: Progressing toward goals    Frequency    Min 1X/week      PT Plan      Co-evaluation              AM-PAC PT 6 Clicks Mobility   Outcome Measure  Help needed turning from your back to your side while in a flat bed without using bedrails?: A Little Help needed moving from lying on your back to sitting on the side of a flat bed without using bedrails?: A Little Help needed moving to and from a bed to a chair (including a wheelchair)?: A Little Help needed standing up from a chair using your arms (e.g., wheelchair or bedside chair)?: A Little Help needed to walk in hospital room?: Total Help needed climbing 3-5 steps with a railing? : Total 6 Click Score: 14    End of Session Equipment Utilized During Treatment: Gait belt Activity Tolerance: Patient limited by fatigue (Modified RPE scale 7/10 following gait) Patient left: in bed;with call bell/phone within reach;with family/visitor present Nurse Communication: Mobility status PT Visit Diagnosis: Unsteadiness on feet (R26.81);Muscle weakness (generalized) (M62.81)     Time: 8490-8470 PT Time Calculation (min) (ACUTE ONLY): 20 min  Charges:    $Gait Training: 8-22 mins                       Randall SAUNDERS, PT, DPT Acute Rehabilitation Services Office: [phone] Secure Chat Preferred    Deanna Scott 12/14/2023, 4:45 PM

## 2023-12-14 NOTE — Progress Notes (Signed)
 Rounding Note    Patient Name: Deanna Scott Date of Encounter: 12/14/2023  [facility] Cardiologist: Annabella Scarce, MD   Subjective   Feels well, no CP, SOB  Inpatient Medications    Scheduled Meds:  alteplase   2 mg Intracatheter Once   atorvastatin   80 mg Oral Daily   Chlorhexidine  Gluconate Cloth  6 each Topical Daily   feeding supplement  237 mL Oral BID BM   ferrous sulfate   325 mg Oral QODAY   heparin   5,000 Units Subcutaneous Q8H   insulin  aspart  0-15 Units Subcutaneous Q4H   magic mouthwash  5 mL Oral TID   multivitamin with minerals  1 tablet Oral Daily   mupirocin  ointment  1 Application Nasal BID   mouth rinse  15 mL Mouth Rinse 4 times per day   pantoprazole   40 mg Oral Daily   senna  1 tablet Oral Daily   Continuous Infusions:  sodium chloride  10 mL/hr at 12/11/23 0800   PRN Meds: sodium chloride , acetaminophen , ipratropium-albuterol , ondansetron  (ZOFRAN ) IV, mouth rinse, polyethylene glycol, prochlorperazine , simethicone    Vital Signs    Vitals:   12/13/23 2026 12/14/23 0015 12/14/23 0447 12/14/23 0806  BP: (!) 120/46 (!) 123/91 125/79 125/73  Pulse: 86 77 78 75  Resp: 18 16 19 18   Temp: 98 F (36.7 C) 97.7 F (36.5 C) 98.1 F (36.7 C) 98.1 F (36.7 C)  TempSrc: Oral Oral Oral Oral  SpO2: 95% 96% 95% 96%  Weight:   94 kg   Height:        Intake/Output Summary (Last 24 hours) at 12/14/2023 1016 Last data filed at 12/13/2023 1712 Gross per 24 hour  Intake --  Output 500 ml  Net -500 ml      12/14/2023    4:47 AM 12/13/2023    4:05 AM 12/12/2023    4:13 AM  Last 3 Weights  Weight (lbs) 207 lb 3.8 oz 202 lb 9.6 oz 207 lb 14.3 oz  Weight (kg) 94.003 kg 91.899 kg 94.3 kg      Telemetry    SR - Personally Reviewed  ECG    SR 73 - Personally Reviewed  Physical Exam   GEN: No acute distress.   Neck: No JVD Cardiac: RRR, no murmurs, rubs, or gallops.  Respiratory: CTA b/l GI: Soft, nontender, non-distended  MS: No LE  edema; LUE/hand swollen. Neuro:  Nonfocal  Psych: Normal affect   ICD siteL stable, no bleeding or hematoma  Labs    High Sensitivity Troponin:   Recent Labs  Lab 12/06/23 1525 12/06/23 1734 12/06/23 2005 12/11/23 0938 12/11/23 1336  TROPONINIHS 484* 1,531* 1,666* 43* 34*     Chemistry Recent Labs  Lab 12/09/23 0411 12/09/23 1706 12/11/23 1752 12/12/23 0450 12/13/23 0845  NA 142   < > 135 136 136  K 4.2   < > 4.4 4.2 3.9  CL 111   < > 101 102 103  CO2 24   < > 23 25 21*  GLUCOSE 289*   < > 194* 112* 147*  BUN 39*   < > 23* 20 24*  CREATININE 1.08*   < > 0.71 0.68 0.78  CALCIUM  7.5*   < > 8.7* 8.4* 8.6*  MG 2.2   < > 2.2 2.2 2.2  PROT 4.7*  --   --  5.3*  --   ALBUMIN 2.2*  --   --  2.6*  --   AST 53*  --   --  29  --   ALT 58*  --   --  34  --   ALKPHOS 59  --   --  75  --   BILITOT 0.7  --   --  0.8  --   GFRNONAA >60   < > >60 >60 >60  ANIONGAP 7   < > 11 9 12    < > = values in this interval not displayed.    Lipids No results for input(s): CHOL, TRIG, HDL, LABVLDL, LDLCALC, CHOLHDL in the last 168 hours.  Hematology Recent Labs  Lab 12/12/23 0450 12/13/23 0845 12/14/23 0228  WBC 23.9* 23.1* 20.2*  RBC 3.30* 3.58* 3.49*  HGB 10.1* 10.9* 10.3*  HCT 30.0* 32.3* 31.7*  MCV 90.9 90.2 90.8  MCH 30.6 30.4 29.5  MCHC 33.7 33.7 32.5  RDW 14.6 14.6 14.8  PLT 126* 176 188   Thyroid No results for input(s): TSH, FREET4 in the last 168 hours.  BNPNo results for input(s): BNP, PROBNP in the last 168 hours.  DDimer No results for input(s): DDIMER in the last 168 hours.   Radiology     VAS US  UPPER EXTREMITY VENOUS DUPLEX Result Date: 12/13/2023 UPPER VENOUS STUDY  Patient Name:  GUDRUN AXE  Date of Exam:   12/13/2023 Medical Rec #: [id number]       Accession #:    [id number] Date of Birth: 04/15/1971       Patient Gender: F Patient Age:   40 years Exam Location:  [facility] Procedure:      VAS US  UPPER EXTREMITY VENOUS DUPLEX  Referring Phys: CARINA BROWN --------------------------------------------------------------------------------  Indications: Pain, Swelling, Erythema, Palpable Cord, and IV infiltration Comparison Study: No prior study Performing Technologist: Alberta Lis RVS  Examination Guidelines: A complete evaluation includes B-mode imaging, spectral Doppler, color Doppler, and power Doppler as needed of all accessible portions of each vessel. Bilateral testing is considered an integral part of a complete examination. Limited examinations for reoccurring indications may be performed as noted.  Right Findings: Summary:  Right: No evidence of thrombosis in the subclavian.  Left: No evidence of deep vein thrombosis in the upper extremity. Findings consistent with acute superficial vein thrombosis involving the left basilic vein and left cephalic vein.  *See table(s) above for measurements and observations.    Preliminary     Cardiac Studies   12/11/23: c.MRI IMPRESSION: 1.  Normal LV size, wall thickness, and systolic function (EF 67%) 2.  Normal RV size and systolic function (EF 68%) 3.  No late gadolinium enhancement to suggest myocardial scar 4.  Dilated ascending aorta measuring 40mm  12/06/23: [facility] Left Heart Catheterization 12/06/23: Hemodynamic data: LVEDP 6 mmHg, no pressure gradient across the aortic valve.   Angiographic data: LV: Mild decrease in LVEF, inferoseptal hypokinesis.  No significant mitral regurgitation. RCA: Large-caliber vessel, smooth and normal. LAD: Gives origin to a very large diagonal 1 and several small diagonals.  It is smooth and normal. LCx: Large-caliber vessel, continues his OM1 and OM 2 after giving origin to a small AV groove branch.  Smooth and normal.  12/06/23: TTE  1. Very poor image quality with poor endocardial border definition and  off axis images. . Left ventricular ejection fraction, by estimation, is  grossly estimated at 45 to 50%. The left ventricle has  mildly decreased  function. The left ventricle has  multiple wall motion abnormalities. Left ventricular diastolic function  could not be evaluated. There is akinesis of the left ventricular, entire  anterior wall, apical septal wall and mid anteroseptal and inferoseptal  walls.   2. Right ventricular systolic function is normal. The right ventricular  size is normal. There is normal pulmonary artery systolic pressure. The  estimated right ventricular systolic pressure is 25.3 mmHg.   3. The mitral valve is normal in structure. No evidence of mitral valve  regurgitation. No evidence of mitral stenosis.   4. The aortic valve is normal in structure. Aortic valve regurgitation is  not visualized. No aortic stenosis is present.   5. The inferior vena cava is dilated in size with >50% respiratory  variability, suggesting right atrial pressure of 8 mmHg.   6. Recommend repeat limited echo with definity to further assess EF and  wall motion.   Patient Profile     53 y.o. female chronic chest pain (prior cath with clean cors 2023), tobacco use, GERD, obesity, urinary incontinence s/p bladder sling, repaired CSF leak   Admitted 12/05/23 after syncopal episode at home. On 01/30 had resuscitated VF arrest followed by torsades/recurrent VF d/t R on T phenomenon.   01/30 had resuscitated VF arrest followed by torsades/recurrent VF d/t R on T phenomenon.  recurrent VF d/t R on T phenomenon  Temp pacer placed in cath lab.  Lido amio eventually stopped  -Cardiac cath with no significant CAD or evidence of SCAD -Echo 01/30: EF 40-45%, septal AK, no aortic dissection  Assessment & Plan    PMVT/VF cardiac arrest Electrolyte derangement >> uncertain that his played a role >> stabilized Non-ischemic CMP cMRI with normal LVEF and no LGE Suspect was electrolyte abnormalities in the setting of her PVCs, but given no clear reversible cause, planned for ICD for secondary prevention  S/p ICD implant  yesterday Site is stable CXR without PTX, stable lead position by our review Device check this morning with stable measurements Wound care and activity restrictions reviewed with the patient No driving 6 months discussed with the patient   Acte systolic HF Normal LVEF on her c.MRI No ongoing volume OL   LUE swelling Superficial vein thrombosis Ok to Elevate slightly, hand exercises No a/c     Sepsis Acute respiratory failure with hypercarbia and hypoxia UTI Leukocytosis >> continues to trend downward afebrile C/w attending/IM team   EP service will sign off though remain available Follow up is in place wound care and activity instructions are I her AVS Please recall if needed  For questions or updates, please contact [facility] Please consult [email or web address] for contact info under        Signed, Charlies Macario Arthur, PA-C  12/14/2023, 10:16 AM

## 2023-12-15 DIAGNOSIS — I469 Cardiac arrest, cause unspecified: Secondary | ICD-10-CM | POA: Diagnosis not present

## 2023-12-15 DIAGNOSIS — R531 Weakness: Secondary | ICD-10-CM

## 2023-12-15 LAB — GLUCOSE, CAPILLARY
Glucose-Capillary: 114 mg/dL — ABNORMAL HIGH (ref 70–99)
Glucose-Capillary: 151 mg/dL — ABNORMAL HIGH (ref 70–99)
Glucose-Capillary: 153 mg/dL — ABNORMAL HIGH (ref 70–99)
Glucose-Capillary: 173 mg/dL — ABNORMAL HIGH (ref 70–99)
Glucose-Capillary: 219 mg/dL — ABNORMAL HIGH (ref 70–99)
Glucose-Capillary: 228 mg/dL — ABNORMAL HIGH (ref 70–99)

## 2023-12-15 MED ORDER — ENOXAPARIN SODIUM 40 MG/0.4ML IJ SOSY
40.0000 mg | PREFILLED_SYRINGE | Freq: Every day | INTRAMUSCULAR | Status: DC
  Administered 2023-12-15 – 2023-12-17 (×3): 40 mg via SUBCUTANEOUS
  Filled 2023-12-15 (×3): qty 0.4

## 2023-12-15 MED ORDER — POLYETHYLENE GLYCOL 3350 17 G PO PACK
17.0000 g | PACK | Freq: Every day | ORAL | Status: DC
  Administered 2023-12-16 – 2023-12-17 (×2): 17 g via ORAL
  Filled 2023-12-15 (×2): qty 1

## 2023-12-15 NOTE — Assessment & Plan Note (Addendum)
 S/p ICD placement. Pain controlled. -Cardiology and electrophysiology following, appreciate recommendations. -K>4, Mg>2

## 2023-12-15 NOTE — Progress Notes (Signed)
     Daily Progress Note Intern Pager: [phone]  Patient name: Deanna Scott Medical record number: [id number] Date of birth: 10-25-1971 Age: 53 y.o. Gender: female  Primary Care Provider: Dartha Geralds, DO Consultants: Cardiology, EP Code Status: Full  Pt Overview and Major Events to Date:  1/29: Admitted to FM TS 1/30: Cardiac arrest with ROSC, admitted to ICU 2/4: Patient stable for transfer to floor, FM TS resume his care 2/6: ICD placement  Assessment and Plan: MD is a 53yo F who presented initially with altered mental status and then had ventricular fibrillation leading to cardiac arrest, she had a brief stay in the ICU and is now stable on the general medicine floor. Now s/p ICD placement, medically stable for discharge and awaiting CIR placement.  Assessment & Plan Ventricular fibrillation [facility]) S/p ICD placement. Pain controlled. -Cardiology and electrophysiology following, appreciate recommendations. -K>4, Mg>2 Anemia Lab holiday today Left upper extremity swelling Stable -Heat and elevation to reduce swelling  Chronic and Stable Problems:  Stress-induced acute heart failure: LVEF normalized on cMRI.  Pressure stable, no further treatment needed. Hypertension: Pressures have been acceptable without medication, consider restarting home meds as needed GERD: Protonix  40 mg   FEN/GI: Heart healthy.  MiraLAX  daily. PPx: lovenox  Dispo:CIR  on Monday .  Subjective:  Reports pain is controlled with tylenol . Has been up out of bed and going to the bathroom.  Requesting something for constipation.  Objective: Temp:  [97.5 F (36.4 C)-98.1 F (36.7 C)] 97.8 F (36.6 C) (02/08 0506) Pulse Rate:  [70-82] 70 (02/08 0506) Resp:  [18] 18 (02/08 0506) BP: (110-132)/(65-82) 132/80 (02/08 0506) SpO2:  [96 %-99 %] 99 % (02/08 0506) Weight:  [91.5 kg] 91.5 kg (02/08 0411) Physical Exam: General: Alert, pleasant woman laying comfortably in bed. NAD Cardiovascular:  RRR Respiratory: CTAB. Normal WOB on RA Abdomen: Soft, nontender, nondistended. Normal BS  Laboratory: Most recent CBC Lab Results  Component Value Date   WBC 20.2 (H) 12/14/2023   HGB 10.3 (L) 12/14/2023   HCT 31.7 (L) 12/14/2023   MCV 90.8 12/14/2023   PLT 188 12/14/2023   Most recent BMP    Latest Ref Rng & Units 12/13/2023    8:45 AM  BMP  Glucose 70 - 99 mg/dL 852   BUN 6 - 20 mg/dL 24   Creatinine 9.55 - 1.00 mg/dL 9.21   Sodium 864 - 854 mmol/L 136   Potassium 3.5 - 5.1 mmol/L 3.9   Chloride 98 - 111 mmol/L 103   CO2 22 - 32 mmol/L 21   Calcium  8.9 - 10.3 mg/dL 8.6    Elicia Hamlet, MD 12/15/2023, 5:13 AM  PGY-2, [facility] FPTS Intern pager: [phone], text pages welcome Secure chat group [facility] Teaching Service

## 2023-12-15 NOTE — Progress Notes (Addendum)
 Mobility Specialist Progress Note:   12/15/23 1200  Mobility  Activity Ambulated with assistance in hallway;Ambulated with assistance to bathroom  Level of Assistance Contact guard assist, steadying assist  Assistive Device Other (Comment) (HHA)  Distance Ambulated (ft)  (125 + 125)  Activity Response Tolerated well  Mobility Referral Yes  Mobility visit 1 Mobility  Mobility Specialist Start Time (ACUTE ONLY) 1200  Mobility Specialist Stop Time (ACUTE ONLY) 1220  Mobility Specialist Time Calculation (min) (ACUTE ONLY) 20 min   Pt agreeable to mobility session. Ambulated to BR first to void, then out in hallway with only HHA for comfort. Pt with no c/o throughout, back in bed with all needs met.   Therisa Rana Mobility Specialist Please contact via SecureChat or  Rehab office at [phone]

## 2023-12-15 NOTE — Assessment & Plan Note (Addendum)
 Lab holiday today

## 2023-12-15 NOTE — Assessment & Plan Note (Signed)
 Stable -Heat and elevation to reduce swelling

## 2023-12-16 DIAGNOSIS — R531 Weakness: Secondary | ICD-10-CM | POA: Diagnosis not present

## 2023-12-16 DIAGNOSIS — I469 Cardiac arrest, cause unspecified: Secondary | ICD-10-CM | POA: Diagnosis not present

## 2023-12-16 DIAGNOSIS — I4891 Unspecified atrial fibrillation: Secondary | ICD-10-CM | POA: Diagnosis not present

## 2023-12-16 LAB — CBC
HCT: 30 % — ABNORMAL LOW (ref 36.0–46.0)
Hemoglobin: 9.8 g/dL — ABNORMAL LOW (ref 12.0–15.0)
MCH: 30 pg (ref 26.0–34.0)
MCHC: 32.7 g/dL (ref 30.0–36.0)
MCV: 91.7 fL (ref 80.0–100.0)
Platelets: 210 10*3/uL (ref 150–400)
RBC: 3.27 MIL/uL — ABNORMAL LOW (ref 3.87–5.11)
RDW: 14.6 % (ref 11.5–15.5)
WBC: 17.9 10*3/uL — ABNORMAL HIGH (ref 4.0–10.5)
nRBC: 0 % (ref 0.0–0.2)

## 2023-12-16 LAB — GLUCOSE, CAPILLARY
Glucose-Capillary: 185 mg/dL — ABNORMAL HIGH (ref 70–99)
Glucose-Capillary: 148 mg/dL — ABNORMAL HIGH (ref 70–99)
Glucose-Capillary: 116 mg/dL — ABNORMAL HIGH (ref 70–99)
Glucose-Capillary: 270 mg/dL — ABNORMAL HIGH (ref 70–99)
Glucose-Capillary: 276 mg/dL — ABNORMAL HIGH (ref 70–99)
Glucose-Capillary: 176 mg/dL — ABNORMAL HIGH (ref 70–99)

## 2023-12-16 NOTE — Progress Notes (Signed)
     Daily Progress Note Intern Pager: [phone]  Patient name: Deanna Scott Medical record number: [id number] Date of birth: [birth year] Age: 53 y.o. Gender: female  Primary Care Provider: Dartha Geralds, DO Consultants: Cardiology, EP Code Status: Full Code  Pt Overview and Major Events to Date:  1/29: Admitted to FM TS 1/30: Cardiac arrest with ROSC, admitted to ICU 2/4: Patient stable for transfer to floor, FM TS resume his care 2/6: ICD placement  Assessment and Plan:  MD is a 53yo F who presented initially with altered mental status and then had ventricular fibrillation leading to cardiac arrest, she had a brief stay in the ICU and is now stable on the general medicine floor. Now s/p ICD placement, medically stable for discharge and awaiting CIR placement.  Assessment & Plan Ventricular fibrillation [facility]) S/p ICD placement. Pain controlled. -Cardiology and electrophysiology following, appreciate recommendations. -K>4, Mg>2 Anemia Checking CBC today -CBC Left upper extremity swelling Stable -Heat and elevation to reduce swelling   Chronic and Stable Issues: Stress-induced acute heart failure: LVEF normalized on cMRI.  Pressure stable, no further treatment needed. Hypertension: Pressures have been acceptable without medication, consider restarting home meds as needed GERD: Protonix  40 mg  FEN/GI: Heart Healthy PPx: Lovenox  Dispo:CIR  Monday .   Subjective:  Doing well this morning, good spirits, no complaints  Objective: Temp:  [97.5 F (36.4 C)-98.4 F (36.9 C)] 98.4 F (36.9 C) (02/08 2006) Pulse Rate:  [47-75] 47 (02/08 2006) Resp:  [16-18] 16 (02/08 2006) BP: (106-132)/(57-84) 106/57 (02/08 2006) SpO2:  [72 %-99 %] 72 % (02/08 2006) Weight:  [91.5 kg] 91.5 kg (02/08 0411) Physical Exam: General: NAD, conversant, good mood Cardiovascular: RRR, NRMG Respiratory: CTABL, good WOB Abdomen: Soft, NTTP Extremities: Moving all extremities, no  edema  Laboratory: Most recent CBC Lab Results  Component Value Date   WBC 20.2 (H) 12/14/2023   HGB 10.3 (L) 12/14/2023   HCT 31.7 (L) 12/14/2023   MCV 90.8 12/14/2023   PLT 188 12/14/2023   Most recent BMP    Latest Ref Rng & Units 12/13/2023    8:45 AM  BMP  Glucose 70 - 99 mg/dL 852   BUN 6 - 20 mg/dL 24   Creatinine 9.55 - 1.00 mg/dL 9.21   Sodium 864 - 854 mmol/L 136   Potassium 3.5 - 5.1 mmol/L 3.9   Chloride 98 - 111 mmol/L 103   CO2 22 - 32 mmol/L 21   Calcium  8.9 - 10.3 mg/dL 8.6      Jennelle Riis, MD 12/16/2023, 12:14 AM  PGY-3, [facility] FPTS Intern pager: [phone], text pages welcome Secure chat group [facility] Teaching Service

## 2023-12-16 NOTE — Progress Notes (Signed)
 Occupational Therapy Treatment Patient Details Name: Deanna Scott MRN: [id number] DOB: November 29, 1970 Today's Date: 12/16/2023   History of present illness Pt is a 53 yo female presenting with AMS. S/p collapse on 1/29, unknown LOC Found to be +THC. ON 1/30 pt with with chest pain and DOE, became bradycardic and arrested. Cardiac MRI showed dilated ascending aorta. ICD placement 2/6. PMH: migraines, HTN, chronic dizziness (unclear cause), and CSF leak with endoscopic repair in 2021, urinary incontinence s/p suburethral sling placement   OT comments  Pt. Seen for skilled OT treatment session.  Bed mobility in/out to EOB with S.  LB dressing with set up.  Pt. Pleased with notable improvement with ability to reach BLEs.  Reports LUE edema going down and feels hot packs are helpful.  Remains motivated and eager for progress.  Cont. With acute OT POC.        If plan is discharge home, recommend the following:  A little help with walking and/or transfers;A lot of help with bathing/dressing/bathroom;Assistance with cooking/housework   Equipment Recommendations       Recommendations for Other Services      Precautions / Restrictions Precautions Precautions: Fall;ICD/Pacemaker Precaution Comments: edematous LUE       Mobility Bed Mobility Overal bed mobility: Needs Assistance Bed Mobility: Sidelying to Sit, Sit to Sidelying   Sidelying to sit: Supervision     Sit to sidelying: Supervision General bed mobility comments: pt. laying on R side upon arrival, able to transition into sitting and bring legs off of bed without assistance.  end of session pt. back into R sidelying with no physical assistance. reporting LUE with more functional use and edema has started to go down    Transfers                         Balance                                           ADL either performed or assessed with clinical judgement   ADL Overall ADL's : Needs  assistance/impaired                     Lower Body Dressing: Set up;Sitting/lateral leans Lower Body Dressing Details (indicate cue type and reason): pt. able to bring BUE toward chest and don/doff each sock without assistance.  reviewed pre loading for energy conservation, reviewed not bending forward to reach BLEs as a fall risk prevention, and also not trying to perform LB ADLs in standing and bending forward               General ADL Comments: pt. and dtr. report pt. had just returned from using the b.room, also stoof for oral care at sink and had walked with mobility team prior to my arrival. so declined ambulation    Extremity/Trunk Assessment              Vision       Perception     Praxis      Cognition Arousal: Alert Behavior During Therapy: [facility] for tasks assessed/performed Overall Cognitive Status: Within Functional Limits for tasks assessed  Exercises      Shoulder Instructions       General Comments  Reports issues with vertigo prior to hospitalization. States she was to have tx. Or assessment? For this, states she will cont. To follow up at next rehab venue.  Reviewed information with her regarding vestibular rehab for tx. Of vertigo.      Pertinent Vitals/ Pain       Pain Assessment Pain Assessment: No/denies pain  Home Living                                          Prior Functioning/Environment              Frequency  Min 1X/week        Progress Toward Goals  OT Goals(current goals can now be found in the care plan section)  Progress towards OT goals: Progressing toward goals     Plan      Co-evaluation                 AM-PAC OT 6 Clicks Daily Activity     Outcome Measure   Help from another person eating meals?: A Little Help from another person taking care of personal grooming?: A Little Help from another person toileting, which  includes using toliet, bedpan, or urinal?: A Little Help from another person bathing (including washing, rinsing, drying)?: A Little Help from another person to put on and taking off regular upper body clothing?: A Little Help from another person to put on and taking off regular lower body clothing?: A Little 6 Click Score: 18    End of Session    OT Visit Diagnosis: Unsteadiness on feet (R26.81);Other abnormalities of gait and mobility (R26.89);Muscle weakness (generalized) (M62.81)   Activity Tolerance Patient tolerated treatment well   Patient Left in bed;with call bell/phone within reach;with bed alarm set;with family/visitor present   Nurse Communication Other (comment) (rn states ok to work with pt. no issues or concerns to monitor)        Time: 1208-1223 OT Time Calculation (min): 15 min  Charges: OT General Charges $OT Visit: 1 Visit OT Treatments $Self Care/Home Management : 8-22 mins  Randall, COTA/L Acute Rehabilitation [phone]   CHRISTELLA Nest Lorraine-COTA/L 12/16/2023, 1:42 PM

## 2023-12-16 NOTE — Assessment & Plan Note (Signed)
 Stable -Heat and elevation to reduce swelling

## 2023-12-16 NOTE — Assessment & Plan Note (Signed)
 S/p ICD placement. Pain controlled. -Cardiology and electrophysiology following, appreciate recommendations. -K>4, Mg>2

## 2023-12-16 NOTE — Progress Notes (Signed)
 Mobility Specialist Progress Note:   12/16/23 1105  Mobility  Activity Ambulated with assistance in hallway  Level of Assistance Contact guard assist, steadying assist  Assistive Device Other (Comment) (HHA)  Distance Ambulated (ft)  (150+150)  Activity Response Tolerated well  Mobility Referral Yes  Mobility visit 1 Mobility  Mobility Specialist Start Time (ACUTE ONLY) 1105  Mobility Specialist Stop Time (ACUTE ONLY) 1115  Mobility Specialist Time Calculation (min) (ACUTE ONLY) 10 min   Pt agreeable to mobility session. Required CGA via HHA for safety. Pt with no c/o throughout, back in bed with all needs met.   Therisa Rana Mobility Specialist Please contact via SecureChat or  Rehab office at [phone]

## 2023-12-16 NOTE — Assessment & Plan Note (Addendum)
 Checking CBC today -CBC

## 2023-12-17 DIAGNOSIS — M7989 Other specified soft tissue disorders: Secondary | ICD-10-CM | POA: Diagnosis not present

## 2023-12-17 DIAGNOSIS — I4891 Unspecified atrial fibrillation: Secondary | ICD-10-CM | POA: Diagnosis not present

## 2023-12-17 DIAGNOSIS — G9340 Encephalopathy, unspecified: Secondary | ICD-10-CM

## 2023-12-17 DIAGNOSIS — I469 Cardiac arrest, cause unspecified: Secondary | ICD-10-CM | POA: Diagnosis not present

## 2023-12-17 LAB — GLUCOSE, CAPILLARY
Glucose-Capillary: 148 mg/dL — ABNORMAL HIGH (ref 70–99)
Glucose-Capillary: 163 mg/dL — ABNORMAL HIGH (ref 70–99)

## 2023-12-17 MED ORDER — POLYETHYLENE GLYCOL 3350 17 G PO PACK: 17.0000 g | PACK | Freq: Every day | ORAL | Status: DC | PRN

## 2023-12-17 MED ORDER — ACETAMINOPHEN 325 MG PO TABS: 650.0000 mg | ORAL_TABLET | ORAL | Status: DC | PRN

## 2023-12-17 MED ORDER — MUPIROCIN 2 % EX OINT: 1.0000 | TOPICAL_OINTMENT | Freq: Two times a day (BID) | CUTANEOUS | Status: DC

## 2023-12-17 MED ORDER — FERROUS SULFATE 325 (65 FE) MG PO TABS: 325.0000 mg | ORAL_TABLET | ORAL | Status: DC

## 2023-12-17 MED ORDER — ATORVASTATIN CALCIUM 80 MG PO TABS: 80.0000 mg | ORAL_TABLET | Freq: Every day | ORAL | Status: DC

## 2023-12-17 MED ORDER — SENNA 8.6 MG PO TABS: 1.0000 | ORAL_TABLET | Freq: Every day | ORAL | Status: AC

## 2023-12-17 NOTE — TOC Transition Note (Signed)
 Transition of Care [facility]) - Discharge Note   Patient Details  Name: Deanna Scott MRN: [id number] Date of Birth: Sep 24, 1971  [facility] CM/SW Contact:  Jennett Model, RN Phone Number: 12/17/2023, 10:02 AM   Clinical Narrative:    For dc today, to [facility] CIR , daughter will transport her there which Ann with [facility] states is ok with her.  Staff RN to call Report to [phone].           Patient Goals and CMS Choice            Discharge Placement                       Discharge Plan and Services Additional resources added to the After Visit Summary for                                       Social Drivers of Health (SDOH) Interventions SDOH Screenings   Food Insecurity: Food Insecurity Present (01/10/2023)  Housing: Low Risk  (01/10/2023)  Transportation Needs: No Transportation Needs (01/10/2023)  Utilities: Not At Risk (01/10/2023)  Depression (PHQ2-9): Low Risk  (10/19/2023)  Financial Resource Strain: Medium Risk (01/10/2023)  Tobacco Use: Medium Risk (12/05/2023)     Readmission Risk Interventions     No data to display

## 2023-12-17 NOTE — Discharge Summary (Addendum)
 Family Medicine Teaching [facility] Discharge Summary  Patient name: Deanna Scott Medical record number: [id number] Date of birth: 02/10/1971 Age: 53 y.o. Gender: female Date of Admission: 12/05/2023  Date of Discharge: 12/17/2023  Admitting Physician: Arlina Lair, MD  Primary Care Provider: Vallorie Gayer, DO Consultants: Cardiology, EP   Indication for Hospitalization: altered mental status   Discharge Diagnoses/Problem List:  Principal Problem for Admission: Altered Mental Status  Other Problems addressed during stay:  Active Problems:   Confusion   Ventricular fibrillation (HCC)   Anemia   Left upper extremity swelling   Alteration in mobility due to weakness  Brief Hospital Course:  Wayna Morillo is a 53 y.o.female with a history of migraines, HTN, chronic dizziness, CSF leak who was admitted to the [facility] Medicine Teaching Service at [facility] for AMS. Her hospital course is detailed below:  AMS  VT, VF Cardiac Arrest Presented initially on 1/29 for sudden onset of confusion with collapse associated with chest pain, shortness of breath, vomiting, and incontinence. MRI brain, CT head and EEG unremarkable. Troponin was initially elevated to 161>136>118.  Patient developed new T wave inversions on EKG morning of 1/30 and had intermittent chest pain with exertion that morning.  Cardiology was consulted and patient was started on heparin  drip but she subsequently went into cardiac arrest. ROSC was achieved and she was sent to ICU.   Patient was sent to the Cath Lab for emergent cath.  Left heart cath showed nonischemic cardiomyopathy, very mild inferoseptal hypokinesis with normal EDP, EF approximately 45%.  TEE with no dissection, EF 40-45 with septal akinesis.  In ICU after first cardiac arrest pt had torsades/recurrent VF and had another cardiac arrest.  ROSC was obtained.  Patient had electrolyte abnormalities including hypokalemia which could have precipitated arrest.   She then had a right heart cath where they placed right internal jugular temporary pacing wire, showed biventricular elevated filling pressures, severely reduced cardiac index, consistent with cardiogenic shock.  Patient required pressors while admitted to the ICU.  Cardiac index normalized after pacing and electrolyte repletion.  Patient was extubated on 2/2.  Temporary pacing was discontinued on 2/2.  Given cardiogenic shock, Solu-Medrol  was started and weaned on 2/2 for total of a 5-day course.  Cardiac MRI was conducted which showed no evidence of myocardial scarring and improved ejection fraction.    Patient was moved to floor status on 2/4.  Etiology remained unclear, and further workup was recommended in the outpatient setting. EP proceeded with ICD placement while inpatient, which the patient tolerated well.   Electrolyte Abnormalities During admission, patient continued to have fluctuations in electrolytes including profound hypokalemia, hypomagnesia and hypocalcemia.  Electrolytes were supplemented aggressively. By time of discharge her electrolytes had normalized.  UTI E. coli in urine, susceptibility showed pansensitive E.coli.  Given concern for pseudomonas co-infection during ICU, patient was treated with Zosyn  from 2/1 - 2/5.   Other chronic conditions were medically managed with home medications and formulary alternatives as necessary  PCP Follow-up Recommendations: Follow up with surgeon at [facility] regarding small amount of fluid in sinus found on MRI F/u bloody stools pt intermittently had prior to admission Follow up with genetic testing given unclear cause of cardiac arrest EP follow-up for pacemaker management Continue blood sugar management outpatient.  Disposition: CIR   Discharge Condition: Stable   Discharge Exam:  Vitals:   12/17/23 0558 12/17/23 0753  BP: 112/62 108/64  Pulse: 80 81  Resp: 20 20  Temp: 98 F (36.7 C)  SpO2: 98% 96%    Significant  Procedures: ICD Placement   Significant Labs and Imaging:  Recent Labs  Lab 12/16/23 0805  WBC 17.9*  HGB 9.8*  HCT 30.0*  PLT 210   No results for input(s): "NA", "K", "CL", "CO2", "GLUCOSE", "BUN", "CREATININE", "CALCIUM ", "MG", "PHOS", "ALKPHOS", "AST", "ALT", "ALBUMIN", "PROTEIN" in the last 48 hours.  Pertinent Imaging   MR Cardiac Velocity:   IMPRESSION: 1.  Normal LV size, wall thickness, and systolic function (EF 67%)   2.  Normal RV size and systolic function (EF 68%)   3.  No late gadolinium enhancement to suggest myocardial scar   4.  Dilated ascending aorta measuring 40mm  MR Cardiac Morphology :   IMPRESSION: 1.  Normal LV size, wall thickness, and systolic function (EF 67%)   2.  Normal RV size and systolic function (EF 68%)   3.  No late gadolinium enhancement to suggest myocardial scar   4.  Dilated ascending aorta measuring 40mm   Results/Tests Pending at Time of Discharge: None   Discharge Medications:  Allergies as of 12/17/2023       Reactions   Latex Itching, Dermatitis   Norco [hydrocodone -acetaminophen ] Other (See Comments)   CNS dysphoria   Percocet [oxycodone -acetaminophen ] Other (See Comments)   CNS Dysphoria        Medication List     PAUSE taking these medications    isosorbide  mononitrate 60 MG 24 hr tablet Wait to take this until your doctor or other care provider tells you to start again. Commonly known as: IMDUR  Take 1 tablet (60 mg total) by mouth daily.       STOP taking these medications    pseudoephedrine 30 MG tablet Commonly known as: SUDAFED   rosuvastatin  10 MG tablet Commonly known as: CRESTOR        TAKE these medications    acetaminophen  325 MG tablet Commonly known as: TYLENOL  Take 2 tablets (650 mg total) by mouth every 4 (four) hours as needed for fever, mild pain (pain score 1-3) or moderate pain (pain score 4-6).   atorvastatin  80 MG tablet Commonly known as: LIPITOR Take 1 tablet (80 mg  total) by mouth daily.   ferrous sulfate  325 (65 FE) MG tablet Take 1 tablet (325 mg total) by mouth every other day.   fluticasone  50 MCG/ACT nasal spray Commonly known as: FLONASE  Place 1 spray into both nostrils daily. What changed:  when to take this reasons to take this   mupirocin  ointment 2 % Commonly known as: BACTROBAN  Place 1 Application into the nose 2 (two) times daily.   polyethylene glycol 17 g packet Commonly known as: MIRALAX  / GLYCOLAX  Take 17 g by mouth daily as needed for moderate constipation.   PriLOSEC  OTC 20 MG tablet Generic drug: omeprazole  Take 20 mg by mouth daily as needed (heartburn, acid reflux).   senna 8.6 MG Tabs tablet Commonly known as: SENOKOT Take 1 tablet (8.6 mg total) by mouth daily.        Discharge Instructions: Please refer to Patient Instructions section of EMR for full details.  Patient was counseled important signs and symptoms that should prompt return to medical care, changes in medications, dietary instructions, activity restrictions, and follow up appointments.   Follow-Up Appointments:  Follow-up Information     [facility] Follow up.   Why: inpatient rehab Contact information: [address] [place] [address] [phone]  Brayton Calin, MD 12/17/2023, 2:54 PM PGY-1, [facility]   FPTS Upper-Level Resident Addendum   I have discussed the above with Dr. Leslee Rase and agree with the documented plan. My edits for correction/addition/clarification are included above. Please see any attending notes.   Edison Gore, MD PGY-2, [facility] 12/17/2023 2:54 PM  FPTS Service pager: [phone] (text pages welcome through [employer])

## 2023-12-17 NOTE — Assessment & Plan Note (Deleted)
 Checking CBC today -CBC

## 2023-12-17 NOTE — Assessment & Plan Note (Deleted)
 Stable -Heat and elevation to reduce swelling   Chronic and Stable Issues: Stress-induced acute heart failure: LVEF normalized on cMRI.  Pressure stable, no further treatment needed. Hypertension: Pressures have been acceptable without medication, consider restarting home meds as needed GERD: Protonix  40 mg

## 2023-12-17 NOTE — Assessment & Plan Note (Deleted)
 S/p ICD placement. Pain controlled. -Cardiology and electrophysiology following, appreciate recommendations. -K>4, Mg>2

## 2023-12-17 NOTE — Progress Notes (Signed)
 Physical Therapy Treatment Patient Details Name: Deanna Scott MRN: [id number] DOB: 09-Apr-1971 Today's Date: 12/17/2023   History of Present Illness Pt is a 53 yo female presenting with AMS. S/p collapse on 1/29, unknown LOC Found to be +THC. ON 1/30 pt with with chest pain and DOE, became bradycardic and arrested. Cardiac MRI showed dilated ascending aorta. ICD placement 2/6. PMH: migraines, HTN, chronic dizziness (unclear cause), and CSF leak with endoscopic repair in 2021, urinary incontinence s/p suburethral sling placement    PT Comments  Pt pleasant and agreeable to PT session. Introduce RW for increased stability with gait. Reviewed ICD precautions and instructed pt to limit LUE WBing on AD. Pt increased ambulatory distance this session to ~155ft bouts. She required intermittent rest breaks in standing and seated. Emphasized increased safety awareness with mobility. VC/TC for proper hand placement with transfers and sequencing of RW. Patient will benefit from continued inpatient follow up therapy, >3 hours/day to increase her independence with functional mobility and safety prior to d/c.   If plan is discharge home, recommend the following: Assistance with cooking/housework;Help with stairs or ramp for entrance;Assist for transportation;A little help with walking and/or transfers;A little help with bathing/dressing/bathroom   Can travel by private vehicle        Equipment Recommendations  Rolling walker (2 wheels)    Recommendations for Other Services Rehab consult     Precautions / Restrictions Precautions Precautions: Fall;ICD/Pacemaker Restrictions Weight Bearing Restrictions Per Provider Order: No     Mobility  Bed Mobility Overal bed mobility: Needs Assistance Bed Mobility: Supine to Sit, Sit to Supine     Supine to sit: Supervision, HOB elevated, Used rails     General bed mobility comments: Pt sat up on the R side of bed, managing BLE and trunk, and scooted to EOB.  Returned to room at pt chose to stay seated EOB at end of session.    Transfers Overall transfer level: Needs assistance Equipment used: Rolling walker (2 wheels) Transfers: Sit to/from Stand Sit to Stand: Contact guard assist           General transfer comment: STS x6 reps from EOB and recliner chair with unilateral UE support (RUE). Educated pt on proper positioning of RW with transfers. Pt tends to leave AD too far away when attempting to stand/sit. Improved performance with repetition.    Ambulation/Gait Ambulation/Gait assistance: Contact guard assist Gait Distance (Feet): 100 Feet (x3, standing rest in hallway followed by a seated rest before returning to room.) Assistive device: Rolling walker (2 wheels) Gait Pattern/deviations: Step-through pattern, Decreased stride length, Wide base of support   Gait velocity interpretation: 1.31 - 2.62 ft/sec, indicative of limited community ambulator   General Gait Details: Pt ambulated using RW with minimal WBing through LUE. Pt demonstrated difficulty with scanning the environment and navigating obstacles using RW, required VC to assist with problem solving. Pt ambulated at a decreased pace compared to baseline and intermittently had RW too far infront of her. Educated pt on sequencing and proper positioning which improved with continued bouts. No overt LOB, but unsteady when challenged to dual-task. Pt declined further ambulation secondary to d/c soon and concern of being too fatigued before admittance to IPR.   Stairs             Wheelchair Mobility     Tilt Bed    Modified Rankin (Stroke Patients Only)       Balance Overall balance assessment: Mild deficits observed, not formally tested  Cognition Arousal: Alert Behavior During Therapy: WFL for tasks assessed/performed Overall Cognitive Status: Within Functional Limits for tasks assessed                                  General Comments: Pt can be slightly impulsive, quickly initiating movements. Required VC to remind her of precautions.        Exercises      General Comments General comments (skin integrity, edema, etc.): VSS on RA      Pertinent Vitals/Pain Pain Assessment Pain Assessment: No/denies pain    Home Living                          Prior Function            PT Goals (current goals can now be found in the care plan section) Acute Rehab PT Goals Patient Stated Goal: Return Home Progress towards PT goals: Progressing toward goals    Frequency    Min 1X/week      PT Plan      Co-evaluation              AM-PAC PT "6 Clicks" Mobility   Outcome Measure  Help needed turning from your back to your side while in a flat bed without using bedrails?: A Little Help needed moving from lying on your back to sitting on the side of a flat bed without using bedrails?: A Little Help needed moving to and from a bed to a chair (including a wheelchair)?: A Little Help needed standing up from a chair using your arms (e.g., wheelchair or bedside chair)?: A Little Help needed to walk in hospital room?: Total Help needed climbing 3-5 steps with a railing? : Total 6 Click Score: 14    End of Session Equipment Utilized During Treatment: Gait belt Activity Tolerance: Patient tolerated treatment well Patient left: in bed;with call bell/phone within reach;with family/visitor present Nurse Communication: Mobility status PT Visit Diagnosis: Unsteadiness on feet (R26.81);Muscle weakness (generalized) (M62.81)     Time: 1610-9604 PT Time Calculation (min) (ACUTE ONLY): 18 min  Charges:    $Gait Training: 8-22 mins                      Deanna Scott, PT, DPT Acute Rehabilitation Services Office: [phone] Secure Chat Preferred   Deanna Scott 12/17/2023, 1:33 PM

## 2023-12-21 ENCOUNTER — Other Ambulatory Visit (HOSPITAL_COMMUNITY): Payer: Self-pay

## 2023-12-21 ENCOUNTER — Telehealth: Payer: Self-pay

## 2023-12-21 ENCOUNTER — Telehealth (HOSPITAL_COMMUNITY): Payer: Self-pay

## 2023-12-21 MED ORDER — POLYSACCHARIDE IRON COMPLEX 150 MG PO CAPS
150.0000 mg | ORAL_CAPSULE | Freq: Every day | ORAL | 0 refills | Status: DC
  Filled 2023-12-21: qty 30, 30d supply, fill #0

## 2023-12-21 MED ORDER — ATORVASTATIN CALCIUM 80 MG PO TABS
80.0000 mg | ORAL_TABLET | Freq: Every day | ORAL | 0 refills | Status: DC
Start: 2023-12-21 — End: 2024-01-18
  Filled 2023-12-21: qty 30, 30d supply, fill #0

## 2023-12-21 NOTE — Telephone Encounter (Signed)
Error

## 2023-12-21 NOTE — Telephone Encounter (Signed)
LMOVM for pt to call the device clinic. Her boston monitor is not set up.

## 2023-12-21 NOTE — Telephone Encounter (Signed)
LMOVM for the second time.

## 2023-12-24 ENCOUNTER — Telehealth: Payer: Self-pay

## 2023-12-24 NOTE — Patient Instructions (Signed)
It was great to see you! Thank you for allowing me to participate in your care!  I recommend that you always bring your medications to each appointment as this makes it easy to ensure we are on the correct medications and helps [place] not miss when refills are needed.  Our plans for today:  - Hospital Follow up - Follow up with surgeon at [facility] regarding small amount of fluid in sinus found on MRI   We are checking some labs today, I will call you if they are abnormal will send you a MyChart message or a letter if they are normal.  If you do not hear about your labs in the next 2 weeks please let us know.***  Take care and seek immediate care sooner if you develop any concerns.   Dr. Bess Kinds, MD [facility]

## 2023-12-24 NOTE — Transitions of Care (Post Inpatient/ED Visit) (Signed)
12/24/2023  Name: Deanna Scott MRN: [id number] DOB: 1971/07/11  Today's TOC FU Call Status: Today's TOC FU Call Status:: Successful TOC FU Call Completed TOC FU Call Complete Date: 12/24/23 Patient's Name and Date of Birth confirmed.  Transition Care Management Follow-up Telephone Call Date of Discharge: 12/21/23 Discharge Facility: [facility] Type of Discharge: Inpatient Admission Primary Inpatient Discharge Diagnosis:: brain damage How have you been since you were released from the hospital?: Better Any questions or concerns?: No  Items Reviewed: Did you receive and understand the discharge instructions provided?: Yes Medications obtained,verified, and reconciled?: Yes (Medications Reviewed) Any new allergies since your discharge?: No Dietary orders reviewed?: Yes Do you have support at home?: Yes People in Home: child(ren), adult  Medications Reviewed Today: Medications Reviewed Today     Reviewed by Karena Addison, LPN (Licensed Practical Nurse) on 12/24/23 at 1021  Med List Status: <None>   Medication Order Taking? Sig Documenting Provider Last Dose Status Informant  acetaminophen (TYLENOL) 325 MG tablet [id number]  Take 2 tablets (650 mg total) by mouth every 4 (four) hours as needed for fever, mild pain (pain score 1-3) or moderate pain (pain score 4-6). Elberta Fortis, MD  Active   atorvastatin (LIPITOR) 80 MG tablet [id number]  Take 1 tablet (80 mg total) by mouth daily. Elberta Fortis, MD  Active   atorvastatin (LIPITOR) 80 MG tablet [id number]  Take 1 tablet (80 mg total) by mouth at bedtime for cholesterol.   Active   ferrous sulfate 325 (65 FE) MG tablet [id number]  Take 1 tablet (325 mg total) by mouth every other day. Elberta Fortis, MD  Active   fluticasone [facility]) 50 MCG/ACT nasal spray [id number] No Place 1 spray into both nostrils daily.  Patient taking differently: Place 1 spray into both nostrils daily as needed for allergies.   Alfredo Martinez, MD Past Month Active Self, Child, Pharmacy Records  iron polysaccharides (NIFEREX) 150 MG capsule [id number]  Take 1 capsule (150 mg total) by mouth daily for anemia.   Active   isosorbide mononitrate (IMDUR) 60 MG 24 hr tablet [id number] No Take 1 tablet (60 mg total) by mouth daily. Alfredo Martinez, MD 12/04/2023 Morning Expired 12/20/23 2359 Self, Child, Pharmacy Records  mupirocin ointment (BACTROBAN) 2 % [id number]  Place 1 Application into the nose 2 (two) times daily. Elberta Fortis, MD  Active   omeprazole (PRILOSEC OTC) 20 MG tablet [id number] No Take 20 mg by mouth daily as needed (heartburn, acid reflux). [provider] Past Week Active Self, Child, Pharmacy Records  polyethylene glycol (MIRALAX / GLYCOLAX) 17 g packet [id number]  Take 17 g by mouth daily as needed for moderate constipation. Elberta Fortis, MD  Active   senna (SENOKOT) 8.6 MG TABS tablet [id number]  Take 1 tablet (8.6 mg total) by mouth daily. Elberta Fortis, MD  Active             Home Care and Equipment/Supplies: Were Home Health Services Ordered?: NA Any new equipment or medical supplies ordered?: NA  Functional Questionnaire: Do you need assistance with bathing/showering or dressing?: No Do you need assistance with meal preparation?: No Do you need assistance with eating?: No Do you have difficulty maintaining continence: No Do you need assistance with getting out of bed/getting out of a chair/moving?: No Do you have difficulty managing or taking your medications?: No  Follow up appointments reviewed: PCP Follow-up appointment confirmed?: Yes Date of PCP follow-up appointment?: 12/25/23 Follow-up Provider: [facility] Follow-up appointment confirmed?: Yes Date  of Specialist follow-up appointment?: 12/27/23 Follow-Up Specialty Provider:: cardio Do you need transportation to your follow-up appointment?: No Do you understand care options if your condition(s) worsen?:  Yes-patient verbalized understanding    SIGNATURE Karena Addison, LPN [facility] Nurse Health Advisor Direct Dial [phone]

## 2023-12-24 NOTE — Progress Notes (Unsigned)
  SUBJECTIVE:   CHIEF COMPLAINT / HPI:   Hospital F/u -Admitted for AMS w/ CP elevated trops > Cardiac arrest x 2 following electrolyte disturbance > Pacemaker placed > Card Rehab -Cardiac MRI showed no scarring and recovered EF. - UTI > Zosyn   Vaginal Irritation Has been an issues since leaving rehab. Is on and off, no redness or discharge.  Not peeing more frequently then normal, a little dysuria w/ the irritation  Dizziness Happens when she get's tired, and then she lays down and sleeps  and feels better. Feels more lightheaded than anything. No episodes of presyncope. Just a little woosy at times. Has been since she got out of rehab.    PERTINENT  PMH / PSH:    OBJECTIVE:  BP 110/66   Pulse 77   Ht 5\' 2"  (1.575 m)   Wt 203 lb 3.2 oz (92.2 kg)   LMP 03/02/2012   SpO2 100%   BMI 37.17 kg/m  Physical Exam Neurological:     Cranial Nerves: Cranial nerves 2-12 are intact. No cranial nerve deficit, dysarthria or facial asymmetry.     Sensory: Sensation is intact. No sensory deficit.     Motor: Motor function is intact. No weakness.      ASSESSMENT/PLAN:   Assessment & Plan Dysuria  Vaginal irritation  No follow-ups on file. Bess Kinds, MD 12/25/2023, 11:14 AM PGY-3, [facility] {    This will disappear when note is signed, click to select method of visit    :1}

## 2023-12-25 ENCOUNTER — Ambulatory Visit (INDEPENDENT_AMBULATORY_CARE_PROVIDER_SITE_OTHER): Payer: No Typology Code available for payment source | Admitting: Student

## 2023-12-25 ENCOUNTER — Other Ambulatory Visit (HOSPITAL_COMMUNITY)
Admission: RE | Admit: 2023-12-25 | Discharge: 2023-12-25 | Disposition: A | Discharge status: Home / Self Care | Payer: No Typology Code available for payment source | Source: Ambulatory Visit | Attending: Family Medicine | Admitting: Family Medicine

## 2023-12-25 VITALS — BP 110/66 | HR 77 | Ht 62.0 in | Wt 203.2 lb

## 2023-12-25 DIAGNOSIS — Z09 Encounter for follow-up examination after completed treatment for conditions other than malignant neoplasm: Secondary | ICD-10-CM | POA: Insufficient documentation

## 2023-12-25 DIAGNOSIS — N898 Other specified noninflammatory disorders of vagina: Secondary | ICD-10-CM | POA: Insufficient documentation

## 2023-12-25 DIAGNOSIS — R300 Dysuria: Secondary | ICD-10-CM | POA: Diagnosis not present

## 2023-12-25 DIAGNOSIS — R42 Dizziness and giddiness: Secondary | ICD-10-CM | POA: Diagnosis not present

## 2023-12-25 LAB — POCT URINALYSIS DIP (MANUAL ENTRY)
Bilirubin, UA: NEGATIVE
Blood, UA: NEGATIVE
Glucose, UA: 100 mg/dL — AB
Ketones, POC UA: NEGATIVE mg/dL
Leukocytes, UA: NEGATIVE
Nitrite, UA: POSITIVE — AB
Protein Ur, POC: NEGATIVE mg/dL
Spec Grav, UA: 1.015 (ref 1.010–1.025)
Urobilinogen, UA: 0.2 U/dL
pH, UA: 5.5 (ref 5.0–8.0)

## 2023-12-25 LAB — POCT UA - MICROSCOPIC ONLY: RBC, Urine, Miroscopic: NONE SEEN (ref 0–2)

## 2023-12-26 ENCOUNTER — Inpatient Hospital Stay: Payer: No Typology Code available for payment source | Admitting: Student

## 2023-12-26 LAB — CERVICOVAGINAL ANCILLARY ONLY
Bacterial Vaginitis (gardnerella): NEGATIVE
Candida Glabrata: POSITIVE — AB
Candida Vaginitis: NEGATIVE
Chlamydia: NEGATIVE
Comment: NEGATIVE
Comment: NEGATIVE
Comment: NEGATIVE
Comment: NEGATIVE
Comment: NEGATIVE
Comment: NORMAL
Neisseria Gonorrhea: NEGATIVE
Trichomonas: NEGATIVE

## 2023-12-27 ENCOUNTER — Ambulatory Visit: Payer: No Typology Code available for payment source

## 2023-12-27 NOTE — Assessment & Plan Note (Addendum)
Patient comes in for hospital follow-up, status post cardiac arrest, implantable defibrillator, and cardiac rehab.  Patient reports she is doing well, still notes some tenderness in chest near where pacemaker was placed.  Patient has follow-up with cardiology later this week, where they would do genetic testing to figure out possible cause of cardiac arrest.  Patient having normal bowel movements, no longer having bloody stools. - Follow-up with cardiology

## 2023-12-27 NOTE — Telephone Encounter (Signed)
Pt monitor has updated 12/26/2023.

## 2023-12-27 NOTE — Assessment & Plan Note (Addendum)
Patient comes in with complaint of dizziness/lightheadedness, whenever she gets tired overexerts herself.  Patient reports she will call rest/take a nap and wake up feeling refreshed and fine no longer feeling dizzy.  Patient denies any episodes of presyncope, gnosis has been going on since she left rehab.  Patient has history of inner ear dysfunction, was supposed to be following up with ENT prior to cardiac arrest.  Patient neuroexam benign, nonfocal, with negative orthostatics.  Patient not on any medication that would cause dizziness, and no signs of significant anemia as possible cause. Unsure what true etiology of lightheadedness is, but suspect may be component of fatigue/poor conditioning.  Low concern for CVA, vertigo, or orthostasis.  Will encourage patient to take it easy, and follow-up with ENT. - Continue to monitor - Follow-up with ENT

## 2023-12-27 NOTE — Assessment & Plan Note (Addendum)
Patient comes in with concern of vaginal irritation that began a few days ago, after leaving cardiac rehab.  Patient reports irritation is off and on, no discharge, does appreciate a little dysuria, and pain more frequently than normal.  Patient pelvic exam benign given that patient recently treated for UTI, and recent rehab stay, will test for potential yeast infection.  Will also collect UA for possible UTI versus vaginal irritation, given concern for dysuria. - Vaginal swabs (BV, trichomoniasis, yeast, GC, CT) - UA

## 2023-12-28 ENCOUNTER — Ambulatory Visit: Payer: No Typology Code available for payment source | Attending: Internal Medicine

## 2023-12-28 DIAGNOSIS — Z95810 Presence of automatic (implantable) cardiac defibrillator: Secondary | ICD-10-CM

## 2023-12-28 NOTE — Patient Instructions (Signed)
   After Your ICD (Implantable Cardiac Defibrillator)    Monitor your defibrillator site for redness, swelling, and drainage. Call the device clinic at [phone] if you experience these symptoms or fever/chills.  You may use a hot tub or a pool after your wound check appointment if the incision is completely closed.  Do not lift, push or pull greater than 10 pounds with the affected arm until 6 weeks after your procedure. (01/24/24)There are no other restrictions in arm movement after your wound check appointment.  Your ICD is designed to protect you from life threatening heart rhythms. Because of this, you may receive a shock.   1 shock with no symptoms:  Call the office during business hours. 1 shock with symptoms (chest pain, chest pressure, dizziness, lightheadedness, shortness of breath, overall feeling unwell):  Call 911. If you experience 2 or more shocks in 24 hours:  Call 911. If you receive a shock, you should not drive.  [place] DMV - no driving for 6 months if you receive appropriate therapy from your ICD.   ICD Alerts:  Some alerts are vibratory and others beep. These are NOT emergencies. Please call our office to let us know. If this occurs at night or on weekends, it can wait until the next business day. Send a remote transmission.  If your device is capable of reading fluid status (for heart failure), you will be offered monthly monitoring to review this with you.   Remote monitoring is used to monitor your ICD from home. This monitoring is scheduled every 91 days by our office. It allows [place] to keep an eye on the functioning of your device to ensure it is working properly. You will routinely see your Electrophysiologist annually (more often if necessary).

## 2024-01-01 ENCOUNTER — Other Ambulatory Visit (HOSPITAL_COMMUNITY): Payer: Self-pay

## 2024-01-01 ENCOUNTER — Encounter: Payer: Self-pay | Admitting: Student

## 2024-01-01 ENCOUNTER — Other Ambulatory Visit: Payer: Self-pay

## 2024-01-01 LAB — CUP PACEART INCLINIC DEVICE CHECK
Date Time Interrogation Session: 20250221144328
Implantable Lead Connection Status: 753985
Implantable Lead Implant Date: 20250206
Implantable Lead Location: 753862
Implantable Lead Model: 672
Implantable Lead Serial Number: 267694
Implantable Pulse Generator Implant Date: 20250206
Pulse Gen Serial Number: 350124

## 2024-01-01 MED ORDER — CEFADROXIL 500 MG PO CAPS
1000.0000 mg | ORAL_CAPSULE | Freq: Every day | ORAL | 0 refills | Status: DC
  Filled 2024-01-01: qty 14, 7d supply, fill #0

## 2024-01-01 MED ORDER — BORIC ACID VAGINAL 600 MG VA SUPP
600.0000 mg | Freq: Every evening | VAGINAL | 0 refills | Status: DC
  Filled 2024-01-01: qty 14, 14d supply, fill #0

## 2024-01-01 NOTE — Progress Notes (Signed)
 Normal ICD wound check. Wound well healed. Thresholds, sensing, and impedances consistent with implant measurements with 3.5V safety margin/auto capture until 3 month visit. No episodes.  Reviewed arm restrictions to continue for 6 weeks total post op. Reviewed shock plan.  Pt enrolled in remote follow-up.

## 2024-01-02 ENCOUNTER — Other Ambulatory Visit: Payer: Self-pay

## 2024-01-03 MED ORDER — CEFADROXIL 500 MG PO CAPS: 1000.0000 mg | ORAL_CAPSULE | Freq: Every day | ORAL | 0 refills | Status: DC

## 2024-01-03 MED ORDER — BORIC ACID VAGINAL 600 MG VA SUPP: 600.0000 mg | Freq: Every evening | VAGINAL | 0 refills | Status: AC

## 2024-01-03 NOTE — Telephone Encounter (Signed)
 Called patient to discuss. She was seen at Urgent Care in [facility] yesterday and received a dose of ceftriaxone and was started on PO abx for her UTI. Tells me they also gave her medication for her yeast infection. Eliezer Mccoy, MD

## 2024-01-03 NOTE — Addendum Note (Signed)
 Addended by: Alicia Amel on: 01/03/2024 07:38 AM   Modules accepted: Orders

## 2024-01-04 ENCOUNTER — Ambulatory Visit: Payer: No Typology Code available for payment source | Admitting: Student

## 2024-01-04 ENCOUNTER — Other Ambulatory Visit: Payer: Self-pay

## 2024-01-04 ENCOUNTER — Ambulatory Visit: Payer: Self-pay | Admitting: Student

## 2024-01-08 ENCOUNTER — Ambulatory Visit: Payer: No Typology Code available for payment source | Admitting: Student

## 2024-01-14 ENCOUNTER — Telehealth (HOSPITAL_COMMUNITY): Payer: Self-pay | Admitting: *Deleted

## 2024-01-14 NOTE — Telephone Encounter (Signed)
 Received referral notification from [facility] for this pt to participate in Cardiac rehab. Reviewed pt medical history and recent discharge from [facility] center.  Checked pt insurance benefits ambetter OON.  Unfortunately [facility] is OON for this particular plan and there are no out of network benefits for services. Called to advise pt who was aware of this.  Pt currently living in [place] and has already made connection with program in that area to begin.  Pt anticipates moving back to the [place] area sometime in April and would like to continue her rehab.  Options would be to attend at [facility] which would be in network.  Will attach medical records from [facility] scanned to media.  Pt thanked me for the call. Alanson Aly, BSN Cardiac and Emergency planning/management officer

## 2024-01-18 ENCOUNTER — Encounter: Payer: Self-pay | Admitting: Student

## 2024-01-18 ENCOUNTER — Ambulatory Visit: Payer: No Typology Code available for payment source | Attending: Student | Admitting: Student

## 2024-01-18 VITALS — BP 116/84 | HR 83 | Ht 62.0 in | Wt 207.8 lb

## 2024-01-18 DIAGNOSIS — E782 Mixed hyperlipidemia: Secondary | ICD-10-CM | POA: Diagnosis not present

## 2024-01-18 DIAGNOSIS — I10 Essential (primary) hypertension: Secondary | ICD-10-CM | POA: Diagnosis not present

## 2024-01-18 DIAGNOSIS — I4901 Ventricular fibrillation: Secondary | ICD-10-CM | POA: Diagnosis not present

## 2024-01-18 DIAGNOSIS — Z95810 Presence of automatic (implantable) cardiac defibrillator: Secondary | ICD-10-CM

## 2024-01-18 LAB — CUP PACEART INCLINIC DEVICE CHECK
Date Time Interrogation Session: 20250314125620
HighPow Impedance: 78 Ohm
Implantable Lead Connection Status: 753985
Implantable Lead Implant Date: 20250206
Implantable Lead Location: 753862
Implantable Lead Model: 672
Implantable Lead Serial Number: 267694
Implantable Pulse Generator Implant Date: 20250206
Lead Channel Impedance Value: 665 Ohm
Lead Channel Pacing Threshold Amplitude: 0.7 V
Lead Channel Pacing Threshold Pulse Width: 0.4 ms
Lead Channel Sensing Intrinsic Amplitude: 24.9 mV
Lead Channel Setting Pacing Amplitude: 3.5 V
Lead Channel Setting Pacing Pulse Width: 0.4 ms
Lead Channel Setting Sensing Sensitivity: 0.5 mV
Pulse Gen Serial Number: 350124

## 2024-01-18 NOTE — Patient Instructions (Signed)
 Medication Instructions:  Your physician recommends that you continue on your current medications as directed. Please refer to the Current Medication list given to you today.  *If you need a refill on your cardiac medications before your next appointment, please call your pharmacy*  Lab Work: BMET, MAG-TODAY If you have labs (blood work) drawn today and your tests are completely normal, you will receive your results only by: MyChart Message (if you have MyChart) OR A paper copy in the mail If you have any lab test that is abnormal or we need to change your treatment, we will call you to review the results.  Follow-Up: At [facility], you and your health needs are our priority.  As part of our continuing mission to provide you with exceptional heart care, we have created designated Provider Care Teams.  These Care Teams include your primary Cardiologist (physician) and Advanced Practice Providers (APPs -  Physician Assistants and Nurse Practitioners) who all work together to provide you with the care you need, when you need it.  Your next appointment:   As scheduled  Provider:   Nobie Putnam, MD

## 2024-01-18 NOTE — Progress Notes (Signed)
  Electrophysiology Office Note:   ID:  Deanna Scott, DOB 1971/06/01, MRN [id number]  Primary Cardiologist: Chilton Si, MD Electrophysiologist: Nobie Putnam, MD      History of Present Illness:   Deanna Scott is a 53 y.o. female with h/o HTN, HLD, CSF leak s/p repair, tobacco abuse, and aborted cardiac arrest s/p ICD seen today for routine electrophysiology followup.   Since last being seen in our clinic the patient reports doing very well. Has some discomfort from the bulk of her device in certain positions (like lying on left side), but otherwise no complaints today. Wants to go back to cleaning. Currently, she denies chest pain, palpitations, dyspnea, PND, orthopnea, nausea, vomiting, dizziness, syncope, edema, weight gain, or early satiety.   Review of systems complete and found to be negative unless listed in HPI.   EP Information / Studies Reviewed:    EKG is not ordered today. EKG from 12/14/2023 reviewed which showed NSR at 73 bpm       ICD Interrogation-  reviewed in detail today,  See PACEART report.  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   cMRI with normal LVEF and no LGE  Cardiac cath with no significant CAD or evidence of SCAD Echo 12/06/23: EF 40-45%, septal AK, no aortic dissection  Physical Exam:   VS:  BP 116/84   Pulse 83   Ht 5\' 2"  (1.575 m)   Wt 207 lb 12.8 oz (94.3 kg)   LMP 03/02/2012   SpO2 96%   BMI 38.01 kg/m    Wt Readings from Last 3 Encounters:  01/18/24 207 lb 12.8 oz (94.3 kg)  12/25/23 203 lb 3.2 oz (92.2 kg)  12/17/23 199 lb 6.4 oz (90.4 kg)     GEN: No acute distress  NECK: No JVD; No carotid bruits CARDIAC: Regular rate and rhythm, no murmurs, rubs, gallops RESPIRATORY:  Clear to auscultation without rales, wheezing or rhonchi  ABDOMEN: Soft, non-tender, non-distended EXTREMITIES:  No edema; No deformity   ASSESSMENT AND PLAN:    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 Was felt that electrolyte imbalances also played a role.   Cardiomyopathy EF initially down, but normal on f/u cMRI Likely stunning. No LGE on cMRI.  Recommended genetic testing. We are outside of her insurance network. She would like to defer referral until she can get set up with an in-network EP and be referred through them. Stressed importance of follow through.   HTN Stable on current regimen   HLD Continue statin    Disposition:   Follow up with Dr. Jimmey Ralph as scheduled, then she will likely transition to [facility] re: insurance coverage.    Signed, Graciella Freer, PA-C

## 2024-01-19 LAB — BASIC METABOLIC PANEL
BUN/Creatinine Ratio: 25 — ABNORMAL HIGH (ref 9–23)
BUN: 15 mg/dL (ref 6–24)
CO2: 26 mmol/L (ref 20–29)
Calcium: 9.3 mg/dL (ref 8.7–10.2)
Chloride: 104 mmol/L (ref 96–106)
Creatinine, Ser: 0.6 mg/dL (ref 0.57–1.00)
Glucose: 109 mg/dL — ABNORMAL HIGH (ref 70–99)
Potassium: 4.2 mmol/L (ref 3.5–5.2)
Sodium: 142 mmol/L (ref 134–144)
eGFR: 107 mL/min/{1.73_m2} (ref >59)

## 2024-01-19 LAB — MAGNESIUM: Magnesium: 1.9 mg/dL (ref 1.6–2.3)

## 2024-01-28 ENCOUNTER — Ambulatory Visit (INDEPENDENT_AMBULATORY_CARE_PROVIDER_SITE_OTHER): Payer: No Typology Code available for payment source

## 2024-01-28 DIAGNOSIS — I4901 Ventricular fibrillation: Secondary | ICD-10-CM

## 2024-01-29 LAB — CUP PACEART REMOTE DEVICE CHECK
Battery Remaining Longevity: 180 mo
Battery Remaining Percentage: 100 %
Brady Statistic RV Percent Paced: 0 %
Date Time Interrogation Session: 20250324003100
HighPow Impedance: 77 Ohm
Implantable Lead Connection Status: 753985
Implantable Lead Implant Date: 20250206
Implantable Lead Location: 753862
Implantable Lead Model: 672
Implantable Lead Serial Number: 267694
Implantable Pulse Generator Implant Date: 20250206
Lead Channel Impedance Value: 666 Ohm
Lead Channel Pacing Threshold Amplitude: 0.7 V
Lead Channel Pacing Threshold Pulse Width: 0.4 ms
Lead Channel Setting Pacing Amplitude: 3.5 V
Lead Channel Setting Pacing Pulse Width: 0.4 ms
Lead Channel Setting Sensing Sensitivity: 0.5 mV
Pulse Gen Serial Number: 350124

## 2024-02-14 ENCOUNTER — Ambulatory Visit: Admitting: Physical Therapy

## 2024-03-13 NOTE — Progress Notes (Signed)
 Remote ICD transmission.

## 2024-03-13 NOTE — Addendum Note (Signed)
 Addended by: Edra Govern D on: 03/13/2024 04:50 PM   Modules accepted: Orders

## 2024-03-28 ENCOUNTER — Ambulatory Visit: Payer: No Typology Code available for payment source | Attending: Cardiology | Admitting: Cardiology

## 2024-03-28 NOTE — Progress Notes (Deleted)
 Electrophysiology Office Note:   Date:  03/28/2024  ID:  Deanna Scott, DOB 1970/11/18, MRN [id number]  Primary Cardiologist: Maudine Sos, MD Electrophysiologist: Ardeen Kohler, MD  {Click to update primary MD,subspecialty MD or APP then REFRESH:1}    History of Present Illness:   Deanna Scott is a 53 y.o. female with h/o HTN, HLD, CSF leak s/p repair, tobacco use, THC abuse, PMVT arrest s/p ICD who is being seen today for 3 month post implant follow up.  Discussed the use of AI scribe software for clinical note transcription with the patient, who gave verbal consent to proceed.  History of Present Illness     Review of systems complete and found to be negative unless listed in HPI.   EP Information / Studies Reviewed:    {EKGtoday:28818}      Cardiac MRI 12/2023:  IMPRESSION: 1.  Normal LV size, wall thickness, and systolic function (EF 67%)   2.  Normal RV size and systolic function (EF 68%)   3.  No late gadolinium enhancement to suggest myocardial scar   4.  Dilated ascending aorta measuring 40mm  Left Heart Catheterization 12/06/23: Hemodynamic data: LVEDP 6 mmHg, no pressure gradient across the aortic valve.   Angiographic data: LV: Mild decrease in LVEF, inferoseptal hypokinesis.  No significant mitral regurgitation. RCA: Large-caliber vessel, smooth and normal. LAD: Gives origin to a very large diagonal 1 and several small diagonals.  It is smooth and normal. LCx: Large-caliber vessel, continues his OM1 and OM 2 after giving origin to a small AV groove branch.  Smooth and normal.  Echo 12/06/23:   1. Very poor image quality with poor endocardial border definition and  off axis images. . Left ventricular ejection fraction, by estimation, is  grossly estimated at 45 to 50%. The left ventricle has mildly decreased  function. The left ventricle has  multiple wall motion abnormalities. Left ventricular diastolic function  could not be evaluated. There is  akinesis of the left ventricular, entire  anterior wall, apical septal wall and mid anteroseptal and inferoseptal  walls.   2. Right ventricular systolic function is normal. The right ventricular  size is normal. There is normal pulmonary artery systolic pressure. The  estimated right ventricular systolic pressure is 25.3 mmHg.   3. The mitral valve is normal in structure. No evidence of mitral valve  regurgitation. No evidence of mitral stenosis.   4. The aortic valve is normal in structure. Aortic valve regurgitation is  not visualized. No aortic stenosis is present.   5. The inferior vena cava is dilated in size with >50% respiratory  variability, suggesting right atrial pressure of 8 mmHg.   6. Recommend repeat limited echo with definity to further assess EF and  wall motion.    Physical Exam:   VS:  LMP 03/02/2012    Wt Readings from Last 3 Encounters:  01/18/24 207 lb 12.8 oz (94.3 kg)  12/25/23 203 lb 3.2 oz (92.2 kg)  12/17/23 199 lb 6.4 oz (90.4 kg)     GEN: Well nourished, well developed in no acute distress NECK: No JVD CARDIAC: {EPRHYTHM:28826}, no murmurs, rubs, gallops RESPIRATORY:  Clear to auscultation without rales, wheezing or rhonchi  ABDOMEN: Soft, non-distended EXTREMITIES:  No edema; No deformity   ASSESSMENT AND PLAN:   Deanna Scott is a 53 y.o. female with a history of HTN, HLD, CSF leak s/p repair, tobacco use, THC abuse who initially presented to the ED on 12/05/2023 after a syncopal episode associated with bowel  and bladder incontinence.  At approximately noon on 1/30, the patient had a cardiac arrest.  In the hour preceding her arrest it appears that she had progressively slowing sinus rhythm ultimately with sinus bradycardia and frequent ectopy.  It appears that one of the short couple PVCs ultimately triggered a polymorphic VT arrest.  It is unclear caused her precipitating bradycardia.  I suspect this was secondary to something else.  Of note, her  potassium at the time of cardiac arrest was found to be 2.2 via i-STAT labs and her magnesium  was 1.0.  If this is can be believed then profound hypokalemia could have caused the bradycardia, ectopy and ventricular arrhythmia.  Her electrolytes continued to fluctuate wildly throughout that 24-hour period were also associated with profound hyperglycemia.  Her LV ejection fraction was found to be mildly decreased post arrest.  The patient remains intubated and additional history is likely needed to help elucidate etiology of her arrest.  If her arrhythmia can be attributed to a reversible cause such as electrolyte derangements.  Then ICD may not be necessarily warranted.  However, if no reversible etiology can be found the patient would need secondary prevention ICD prior to discharge.   Problem List:  PMVT/VF cardiac arrest Hypokalemia Hypomagnesemia Hypocalcemia  Acute systolic heart failure   Plan:  -Spoke with patient's daughter. Still unable to gather any history from patient. It seems patient had been feeling unwell leading up to her syncopal event at home. And was feeling poorly the morning of her cardiac arrest. Unclear what exactly what was causing patient's symptoms.  -Temporary pacing wire has been removed. Planning for cardiac MRI.  -Discontinue lidocaine  and monitor. If frequent PVCs then resume.  -Aggressively monitor and replete electrolytes.     Follow up with {WUJWJ:19147} {EPFOLLOW WG:95621}  Signed, Ardeen Kohler, MD

## 2024-04-01 ENCOUNTER — Other Ambulatory Visit (HOSPITAL_COMMUNITY): Payer: Self-pay

## 2024-04-15 ENCOUNTER — Encounter: Payer: Self-pay | Admitting: *Deleted

## 2024-05-02 ENCOUNTER — Ambulatory Visit
Admission: EM | Admit: 2024-05-02 | Discharge: 2024-05-02 | Disposition: A | Discharge status: Home / Self Care | Attending: Family Medicine | Admitting: Family Medicine

## 2024-05-02 ENCOUNTER — Other Ambulatory Visit: Payer: Self-pay

## 2024-05-02 DIAGNOSIS — J0110 Acute frontal sinusitis, unspecified: Secondary | ICD-10-CM

## 2024-05-02 MED ORDER — FLUCONAZOLE 150 MG PO TABS: 150.0000 mg | ORAL_TABLET | Freq: Every day | ORAL | 0 refills | Status: AC

## 2024-05-02 MED ORDER — FLUTICASONE PROPIONATE 50 MCG/ACT NA SUSP: 1.0000 | Freq: Every day | NASAL | 0 refills | Status: AC

## 2024-05-02 MED ORDER — AMOXICILLIN-POT CLAVULANATE 875-125 MG PO TABS: 1.0000 | ORAL_TABLET | Freq: Two times a day (BID) | ORAL | 0 refills | Status: DC

## 2024-05-02 NOTE — Discharge Instructions (Addendum)
 Start Augmentin  twice daily for 7 days.  Flonase  daily.  Nasal rinses as tolerated.  Diflucan  to prevent antibiotic induced yeast infections.  Lots of rest and fluids.  Please follow-up with your PCP if your symptoms do not improve.  Please go to the ER for any worsening symptoms.  Hope you feel better soon!

## 2024-05-02 NOTE — ED Provider Notes (Signed)
 [facility]    CSN: [id number] Arrival date & time: 05/02/24  1024      History   Chief Complaint No chief complaint on file.   HPI Delanee Xin is a 53 y.o. female  presents for evaluation of URI symptoms for 7 days. Patient reports associated symptoms of sinus pressure/pain with aching teeth, ear pain and dizziness, sinus headache.  Reports she gets dizziness with sinus infections.  Denies worst headache of life, visual changes, or syncope.  Denies N/V/D, cough, fevers, sore throat, body aches, shortness of breath. Patient does note have a hx of asthma. Patient is not an active smoker.   Reports no known sick contacts.  Pt has taken Sudafed OTC for symptoms. Pt has no other concerns at this time.   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   Qualifier: Diagnosis of  By: Curtis MD, Debby      Patient Active Problem List   Diagnosis Date Noted   Hospital discharge follow-up 12/25/2023   Vaginal irritation 12/25/2023   Alteration in mobility due to weakness 12/15/2023   Left upper extremity swelling 12/13/2023   Anemia 12/11/2023   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   Confusion 12/05/2023   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/04/2023   Acute recurrent frontal sinusitis 09/21/2023   Perimenopause 05/21/2023   Bacterial sinusitis 05/21/2023   Bloody diarrhea 09/16/2022   Right foot pain 08/21/2022   Elevated alkaline phosphatase level 08/11/2022   Angina pectoris (HCC) 06/08/2022   Environmental allergies 06/08/2022   Prediabetes 10/05/2021   Dyslipidemia 10/05/2021   HTN (hypertension) 09/19/2021    Hyperlipidemia 09/06/2021   BV (bacterial vaginosis) 11/11/2020   Snoring 06/08/2020   Extremity numbness 10/24/2019   Shortness of breath 10/23/2019   Lateral epicondylitis 10/08/2019   Fatigue 10/08/2019   Screening breast examination 10/07/2019   Triceps tendonitis 07/21/2019   Low back pain 06/18/2019   Vaginal wall prolapse 07/29/2017   Migraines 07/27/2017   GERD (gastroesophageal reflux disease) 07/29/2014   Dizziness 02/06/2013   OBESITY, NOS 01/03/2007    Past Surgical History:  Procedure Laterality Date   ABDOMINAL HYSTERECTOMY     ARTERIAL LINE INSERTION Right 12/06/2023   Procedure: ARTERIAL LINE INSERTION;  Surgeon: Cherrie Toribio SAUNDERS, MD;  Location: [facility];  Service: Cardiovascular;  Laterality: Right;   BLADDER SUSPENSION  2009   CENTRAL LINE INSERTION Left 12/06/2023   Procedure: CENTRAL LINE INSERTION;  Surgeon: Cherrie Toribio SAUNDERS, MD;  Location: [facility];  Service: Cardiovascular;  Laterality: Left;   CORONARY PRESSURE/FFR STUDY N/A 06/13/2022   Procedure: INTRAVASCULAR PRESSURE WIRE/FFR STUDY;  Surgeon: Claudene Victory ORN, MD;  Location: [facility];  Service: Cardiovascular;  Laterality: N/A;   DIAGNOSTIC LAPAROSCOPY     ectopic pregnancy   DILATION AND CURETTAGE OF UTERUS     hx mab   endoscopy nasal sinus surgery  01/13/2020   [facility] - CFS Leak Repair   ICD IMPLANT N/A 12/13/2023   Procedure: ICD IMPLANT;  Surgeon: Kennyth Chew,  MD;  Location: [facility];  Service: Cardiovascular;  Laterality: N/A;   LEFT HEART CATH AND CORONARY ANGIOGRAPHY N/A 06/13/2022   Procedure: LEFT HEART CATH AND CORONARY ANGIOGRAPHY;  Surgeon: Claudene Victory ORN, MD;  Location: [facility];  Service: Cardiovascular;  Laterality: N/A;   LEFT HEART CATH AND CORONARY ANGIOGRAPHY N/A 12/06/2023   Procedure: LEFT HEART CATH AND CORONARY ANGIOGRAPHY;  Surgeon: Ladona Heinz, MD;  Location: [facility];  Service: Cardiovascular;  Laterality: N/A;    RIGHT HEART CATH N/A 12/06/2023   Procedure: RIGHT HEART CATH;  Surgeon: Cherrie Toribio SAUNDERS, MD;  Location: [facility];  Service: Cardiovascular;  Laterality: N/A;   SACROSPINOUS LIGAMENT FIXATION     posterior repair wtih cysto   SVD     x 2   TEMPORARY PACEMAKER N/A 12/06/2023   Procedure: TEMPORARY PACEMAKER;  Surgeon: Cherrie Toribio SAUNDERS, MD;  Location: [facility];  Service: Cardiovascular;  Laterality: N/A;   TRANSESOPHAGEAL ECHOCARDIOGRAM (CATH LAB) N/A 12/06/2023   Procedure: TRANSESOPHAGEAL ECHOCARDIOGRAM;  Surgeon: Pietro Redell RAMAN, MD;  Location: [facility];  Service: Cardiovascular;  Laterality: N/A;   TUBAL LIGATION  1999   VAGINAL HYSTERECTOMY  03/13/2012   Procedure: HYSTERECTOMY VAGINAL;  Surgeon: Winton Felt, MD;  Location: [facility];  Service: Gynecology;  Laterality: N/A;    OB History     Gravida  4   Para  2   Term  2   Preterm      AB  2   Living  2      SAB  1   IAB      Ectopic  1   Multiple      Live Births               Home Medications    Prior to Admission medications   Medication Sig Start Date End Date Taking? Authorizing Provider  amoxicillin -clavulanate (AUGMENTIN ) 875-125 MG tablet Take 1 tablet by mouth every 12 (twelve) hours. 05/02/24  Yes Jackquline Branca, Jodi R, NP  fluconazole  (DIFLUCAN ) 150 MG tablet Take 1 tablet (150 mg total) by mouth daily. 05/02/24  Yes Kimetha Trulson, Jodi R, NP  fluticasone  (FLONASE ) 50 MCG/ACT nasal spray Place 1 spray into both nostrils daily. 05/02/24  Yes Aurie Harroun, Jodi R, NP  acetaminophen  (TYLENOL ) 500 MG tablet Take 500 mg by mouth. 12/21/23   [provider]  atorvastatin  (LIPITOR) 80 MG tablet Take 1 tablet (80 mg total) by mouth daily. 12/17/23   Theophilus Pagan, MD  polyethylene glycol (MIRALAX  / GLYCOLAX ) 17 g packet Take 17 g by mouth daily as needed for moderate constipation. 12/17/23   Theophilus Pagan, MD  senna (SENOKOT) 8.6 MG TABS tablet Take 1 tablet (8.6 mg total) by mouth  daily. 12/17/23   Theophilus Pagan, MD    Family History Family History  Problem Relation Age of Onset   Other Mother        balance issues from a thing in her brain requires walker   Colon cancer Mother 66       dx 2019   Cancer Father    Heart disease Maternal Grandmother    Diabetes Maternal Grandmother    Rectal cancer Neg Hx    Stomach cancer Neg Hx     Social History Social History   Tobacco Use   Smoking status: Former    Current packs/day: 0.00    Average packs/day: 1 pack/day for 34.0 years (34.0 ttl pk-yrs)  Types: Cigarettes    Start date: 09/03/1981    Quit date: 09/04/2015    Years since quitting: 8.6   Smokeless tobacco: Never   Tobacco comments:    Started age 31 - quit age 11  Vaping Use   Vaping status: Former  Substance Use Topics   Alcohol use: Yes    Alcohol/week: 2.0 standard drinks of alcohol    Types: 2 Standard drinks or equivalent per week    Comment: socially   Drug use: No     Allergies   Latex, Norco [hydrocodone -acetaminophen ], and Percocet [oxycodone -acetaminophen ]   Review of Systems Review of Systems  HENT:  Positive for congestion, sinus pressure and sinus pain.   Neurological:  Positive for dizziness and headaches.     Physical Exam Triage Vital Signs ED Triage Vitals  Encounter Vitals Group     BP 05/02/24 1033 134/85     Girls Systolic BP Percentile --      Girls Diastolic BP Percentile --      Boys Systolic BP Percentile --      Boys Diastolic BP Percentile --      Pulse Rate 05/02/24 1033 72     Resp 05/02/24 1033 17     Temp 05/02/24 1033 98.5 F (36.9 C)     Temp Source 05/02/24 1033 Oral     SpO2 05/02/24 1033 96 %     Weight --      Height --      Head Circumference --      Peak Flow --      Pain Score 05/02/24 1030 6     Pain Loc --      Pain Education --      Exclude from Growth Chart --    No data found.  Updated Vital Signs BP 134/85   Pulse 72   Temp 98.5 F (36.9 C) (Oral)   Resp 17    LMP 03/02/2012   SpO2 96%   Visual Acuity Right Eye Distance:   Left Eye Distance:   Bilateral Distance:    Right Eye Near:   Left Eye Near:    Bilateral Near:     Physical Exam Vitals and nursing note reviewed.  Constitutional:      General: She is not in acute distress.    Appearance: She is well-developed. She is not ill-appearing.  HENT:     Head: Normocephalic and atraumatic.     Right Ear: Ear canal normal. A middle ear effusion is present.     Left Ear: Tympanic membrane and ear canal normal.     Nose: Congestion present.     Right Turbinates: Swollen and pale.     Left Turbinates: Swollen and pale.     Right Sinus: Frontal sinus tenderness present. No maxillary sinus tenderness.     Left Sinus: Frontal sinus tenderness present. No maxillary sinus tenderness.     Mouth/Throat:     Mouth: Mucous membranes are moist.     Pharynx: Oropharynx is clear. Uvula midline. No posterior oropharyngeal erythema.     Tonsils: No tonsillar exudate or tonsillar abscesses.   Eyes:     Conjunctiva/sclera: Conjunctivae normal.     Pupils: Pupils are equal, round, and reactive to light.    Cardiovascular:     Rate and Rhythm: Normal rate and regular rhythm.     Heart sounds: Normal heart sounds.  Pulmonary:     Effort: Pulmonary effort is normal.     Breath  sounds: Normal breath sounds. No wheezing or rhonchi.   Musculoskeletal:     Cervical back: Normal range of motion and neck supple.  Lymphadenopathy:     Cervical: No cervical adenopathy.   Skin:    General: Skin is warm and dry.   Neurological:     General: No focal deficit present.     Mental Status: She is alert and oriented to person, place, and time.   Psychiatric:        Mood and Affect: Mood normal.        Behavior: Behavior normal.      UC Treatments / Results  Labs (all labs ordered are listed, but only abnormal results are displayed) Labs Reviewed - No data to display  Basic Metabolic Panel Order:  [id number] Component Ref Range & Units 1 mo ago  Sodium 136 - 145 mmol/L 140  Potassium 3.5 - 5.1 mmol/L 4.1  Comment: NO VISIBLE HEMOLYSIS  Chloride 98 - 107 mmol/L 102  CO2 21 - 31 mmol/L 30  Anion Gap 6 - 14 mmol/L 8  Glucose, Random 70 - 99 mg/dL 884 High   Blood Urea Nitrogen (BUN) 7 - 25 mg/dL 21  Creatinine 9.39 - 8.79 mg/dL 9.32  eGFR >40 fO/fpw/8.26f7 >90  Comment: GFR estimated by CKD-EPI equations(NKF 2021).  Recommend confirmation of Cr-based eGFR by using Cys-based eGFR and other filtration markers (if applicable) in complex cases and clinical decision-making, as needed.  Calcium  8.6 - 10.3 mg/dL 9.7  BUN/Creatinine Ratio   Comment: Creatinine is normal, ratio is not clinically indicated.  Resulting Agency [facility] [place](CLIA# [id number])   Specimen Collected: 03/04/24 08:55   Performed by: [facility](CLIA# [id number]) Last Resulted: 03/04/24 14:29    EKG   Radiology No results found.  Procedures Procedures (including critical care time)  Medications Ordered in UC Medications - No data to display  Initial Impression / Assessment and Plan / UC Course  I have reviewed the triage vital signs and the nursing notes.  Pertinent labs & imaging results that were available during my care of the patient were reviewed by me and considered in my medical decision making (see chart for details).     Reviewed exam and symptoms with patient.  No red flags.  Start Augmentin , Flonase .  Patient reports history of antibiotic induced yeast infection, Rx Diflucan  provided.  Nasal rinses as tolerated.  Advise rest fluids and PCP follow-up if symptoms do not improve.  ER precautions reviewed. Final Clinical Impressions(s) / UC Diagnoses   Final diagnoses:  Acute frontal sinusitis, recurrence not specified     Discharge Instructions      Start Augmentin  twice daily for 7 days.  Flonase  daily.  Nasal rinses as  tolerated.  Diflucan  to prevent antibiotic induced yeast infections.  Lots of rest and fluids.  Please follow-up with your PCP if your symptoms do not improve.  Please go to the ER for any worsening symptoms.  Hope you feel better soon!    ED Prescriptions     Medication Sig Dispense Auth. Provider   amoxicillin -clavulanate (AUGMENTIN ) 875-125 MG tablet Take 1 tablet by mouth every 12 (twelve) hours. 14 tablet Challis Crill, Jodi R, NP   fluticasone  (FLONASE ) 50 MCG/ACT nasal spray Place 1 spray into both nostrils daily. 15.8 mL Carr Shartzer, Jodi R, NP   fluconazole  (DIFLUCAN ) 150 MG tablet Take 1 tablet (150 mg total) by mouth daily. 1 tablet Onika Gudiel, Jodi R, NP  PDMP not reviewed this encounter.   Loreda Myla SAUNDERS, NP 05/02/24 1055

## 2024-05-02 NOTE — ED Triage Notes (Signed)
 Pt c/o sinus drainage, teeth hurting, dizzinessx1wk. Pt states has dried up/slimy mucous in mouth upon waking

## 2024-07-21 NOTE — Progress Notes (Unsigned)
  Electrophysiology Office Note:   ID:  Deanna Scott, DOB 1971/07/24, MRN [id number]  Primary Cardiologist: Annabella Scarce, MD Electrophysiologist: [name], MD *** {Click to update primary MD,subspecialty MD or APP then REFRESH:1}    History of Present Illness:   Deanna Scott is a 53 y.o. female with h/o *** seen today for {VISITTYPE:28148}  Review of systems complete and found to be negative unless listed in HPI.   EP Information / Studies Reviewed:    {EKGtoday:28818}       ICD Interrogation-  reviewed in detail today,  See PACEART report.  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   Physical Exam:   VS:  LMP 03/02/2012    Wt Readings from Last 3 Encounters:  01/18/24 207 lb 12.8 oz (94.3 kg)  12/25/23 203 lb 3.2 oz (92.2 kg)  12/17/23 199 lb 6.4 oz (90.4 kg)     GEN: No acute distress *** NECK: No JVD; No carotid bruits CARDIAC: {EPRHYTHM:28826}, no murmurs, rubs, gallops RESPIRATORY:  Clear to auscultation without rales, wheezing or rhonchi  ABDOMEN: Soft, non-tender, non-distended EXTREMITIES:  {EDEMA LEVEL:28147::No} edema; No deformity   ASSESSMENT AND PLAN:    {Blank single:19197::Ventricular arrhythmia,Aborted Cardiac Arrest,Chronic systolic CHF,HCM}  s/p {INDUSTRY:28136} {Blank single:19197::***,single chamber ICD,dual chamber ICD,CRT-D,S-ICD}  euvolemic today Stable on an appropriate medical regimen Normal ICD function See Pace Art report No changes today  Disposition:   Follow up with {EPPROVIDERS:28135::EP Team} {EPFOLLOW UP:28173}   Signed, Ozell Prentice Passey, PA-C

## 2024-07-22 ENCOUNTER — Ambulatory Visit: Attending: Student | Admitting: Student

## 2024-07-22 ENCOUNTER — Encounter: Payer: Self-pay | Admitting: Student

## 2024-07-22 VITALS — BP 110/78 | HR 72 | Ht 62.0 in | Wt 217.2 lb

## 2024-07-22 DIAGNOSIS — Z1379 Encounter for other screening for genetic and chromosomal anomalies: Secondary | ICD-10-CM | POA: Diagnosis not present

## 2024-07-22 DIAGNOSIS — I469 Cardiac arrest, cause unspecified: Secondary | ICD-10-CM | POA: Diagnosis not present

## 2024-07-22 LAB — CUP PACEART INCLINIC DEVICE CHECK
Date Time Interrogation Session: 20250916124535
HighPow Impedance: 96 Ohm
Implantable Lead Connection Status: 753985
Implantable Lead Implant Date: 20250206
Implantable Lead Location: 753862
Implantable Lead Model: 672
Implantable Lead Serial Number: 267694
Implantable Pulse Generator Implant Date: 20250206
Lead Channel Impedance Value: 761 Ohm
Lead Channel Pacing Threshold Amplitude: 0.6 V
Lead Channel Pacing Threshold Pulse Width: 0.4 ms
Lead Channel Sensing Intrinsic Amplitude: 24.8 mV
Lead Channel Setting Pacing Amplitude: 3.5 V
Lead Channel Setting Pacing Pulse Width: 0.4 ms
Lead Channel Setting Sensing Sensitivity: 0.5 mV
Pulse Gen Serial Number: 350124

## 2024-07-22 NOTE — Patient Instructions (Addendum)
 Medication Instructions:  Your physician recommends that you continue on your current medications as directed. Please refer to the Current Medication list given to you today.  *If you need a refill on your cardiac medications before your next appointment, please call your pharmacy*  Lab Work: None ordered If you have labs (blood work) drawn today and your tests are completely normal, you will receive your results only by: MyChart Message (if you have MyChart) OR A paper copy in the mail If you have any lab test that is abnormal or we need to change your treatment, we will call you to review the results.  Follow-Up: At [facility], you and your health needs are our priority.  As part of our continuing mission to provide you with exceptional heart care, our providers are all part of one team.  This team includes your primary Cardiologist (physician) and Advanced Practice Providers or APPs (Physician Assistants and Nurse Practitioners) who all work together to provide you with the care you need, when you need it.  Your next appointment:   1 year(s)  Provider:   [name], MD   You have been referred to Dr Fairy here in our office for genetics counseling.

## 2024-07-27 ENCOUNTER — Ambulatory Visit: Payer: Self-pay | Admitting: Cardiology

## 2024-08-10 ENCOUNTER — Ambulatory Visit (INDEPENDENT_AMBULATORY_CARE_PROVIDER_SITE_OTHER)

## 2024-08-10 ENCOUNTER — Ambulatory Visit: Admission: EM | Admit: 2024-08-10 | Discharge: 2024-08-10 | Disposition: A | Discharge status: Home / Self Care

## 2024-08-10 DIAGNOSIS — M25521 Pain in right elbow: Secondary | ICD-10-CM

## 2024-08-10 DIAGNOSIS — M25511 Pain in right shoulder: Secondary | ICD-10-CM | POA: Diagnosis not present

## 2024-08-10 NOTE — Discharge Instructions (Addendum)
 The x-rays of your shoulder and elbow do not show any acute abnormality.  Wear the sling as directed for comfort.  Rest your shoulder and arm.  Apply ice packs as directed.  Take Tylenol  as directed.    Follow-up with an orthopedist.

## 2024-08-10 NOTE — ED Triage Notes (Signed)
 Patient to Urgent Care with complaints of right sided arm pain. Reports shoulder to elbow pain. Difficult to raise arm due to pain.   Fall 1 week ago (fell on outstretched hand). Reports on Wednesday started developing more pain.   Taking tylenol  every 6 hours w/ no change.

## 2024-08-10 NOTE — ED Provider Notes (Signed)
 CAY RALPH PELT    CSN: [id number] Arrival date & time: 08/10/24  9046      History   Chief Complaint Chief Complaint  Patient presents with   Arm Injury    HPI Trent Theisen is a 53 y.o. female.  Patient presents with right arm pain from her shoulder to her elbow x 1 week which started after she fell.  She was walking in the woods when she tripped over a plant and fell.  She caught herself with her outstretched arms.  The pain is getting worse.  It is worse with movement and she finds it difficult to lift her arm.  She has been treating her symptoms with Tylenol .  No open wounds, bruising, redness, numbness, weakness.  The history is provided by the patient and medical records.    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   Qualifier: Diagnosis of  By: Curtis MD, Debby      Patient Active Problem List   Diagnosis Date Noted   Hospital discharge follow-up 12/25/2023   Vaginal irritation 12/25/2023   Alteration in mobility due to weakness 12/15/2023   Left upper extremity swelling 12/13/2023   Anemia 12/11/2023   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   Confusion 12/05/2023   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/04/2023   Acute recurrent frontal sinusitis 09/21/2023   Perimenopause 05/21/2023   Bacterial sinusitis 05/21/2023   Bloody diarrhea 09/16/2022   Right foot pain 08/21/2022   Elevated alkaline phosphatase level 08/11/2022   Angina pectoris 06/08/2022   Environmental allergies 06/08/2022   Prediabetes 10/05/2021   Dyslipidemia 10/05/2021   HTN (hypertension) 09/19/2021   Hyperlipidemia 09/06/2021   BV  (bacterial vaginosis) 11/11/2020   Snoring 06/08/2020   Extremity numbness 10/24/2019   Shortness of breath 10/23/2019   Lateral epicondylitis 10/08/2019   Fatigue 10/08/2019   Screening breast examination 10/07/2019   Triceps tendonitis 07/21/2019   Low back pain 06/18/2019   Vaginal wall prolapse 07/29/2017   Migraines 07/27/2017   GERD (gastroesophageal reflux disease) 07/29/2014   Dizziness 02/06/2013   OBESITY, NOS 01/03/2007    Past Surgical History:  Procedure Laterality Date   ABDOMINAL HYSTERECTOMY     ARTERIAL LINE INSERTION Right 12/06/2023   Procedure: ARTERIAL LINE INSERTION;  Surgeon: Cherrie Toribio SAUNDERS, MD;  Location: [facility];  Service: Cardiovascular;  Laterality: Right;   BLADDER SUSPENSION  2009   CENTRAL LINE INSERTION Left 12/06/2023   Procedure: CENTRAL LINE INSERTION;  Surgeon: Cherrie Toribio SAUNDERS, MD;  Location: [facility];  Service: Cardiovascular;  Laterality: Left;   CORONARY PRESSURE/FFR STUDY N/A 06/13/2022   Procedure: INTRAVASCULAR PRESSURE WIRE/FFR STUDY;  Surgeon: Claudene Victory ORN, MD;  Location: [facility];  Service: Cardiovascular;  Laterality: N/A;   DIAGNOSTIC LAPAROSCOPY     ectopic pregnancy   DILATION AND CURETTAGE OF UTERUS     hx mab   endoscopy nasal sinus surgery  01/13/2020   [facility] - CFS Leak Repair   ICD IMPLANT N/A 12/13/2023   Procedure: ICD IMPLANT;  Surgeon: Kennyth Chew, MD;  Location[facility] CV LAB;  Service: Cardiovascular;  Laterality: N/A;   LEFT HEART CATH AND CORONARY ANGIOGRAPHY N/A 06/13/2022   Procedure: LEFT HEART CATH AND CORONARY ANGIOGRAPHY;  Surgeon: Claudene Victory ORN, MD;  Location: [facility];  Service: Cardiovascular;  Laterality: N/A;   LEFT HEART CATH AND CORONARY ANGIOGRAPHY N/A 12/06/2023   Procedure: LEFT HEART CATH AND CORONARY ANGIOGRAPHY;  Surgeon: Ladona Heinz, MD;  Location: [facility];  Service: Cardiovascular;  Laterality: N/A;   RIGHT HEART CATH N/A 12/06/2023    Procedure: RIGHT HEART CATH;  Surgeon: Cherrie Toribio SAUNDERS, MD;  Location: [facility];  Service: Cardiovascular;  Laterality: N/A;   SACROSPINOUS LIGAMENT FIXATION     posterior repair wtih cysto   SVD     x 2   TEMPORARY PACEMAKER N/A 12/06/2023   Procedure: TEMPORARY PACEMAKER;  Surgeon: Cherrie Toribio SAUNDERS, MD;  Location: [facility];  Service: Cardiovascular;  Laterality: N/A;   TRANSESOPHAGEAL ECHOCARDIOGRAM (CATH LAB) N/A 12/06/2023   Procedure: TRANSESOPHAGEAL ECHOCARDIOGRAM;  Surgeon: Pietro Redell RAMAN, MD;  Location: [facility] INVASIVE CV LAB;  Service: Cardiovascular;  Laterality: N/A;   TUBAL LIGATION  1999   VAGINAL HYSTERECTOMY  03/13/2012   Procedure: HYSTERECTOMY VAGINAL;  Surgeon: Winton Felt, MD;  Location: [facility];  Service: Gynecology;  Laterality: N/A;    OB History     Gravida  4   Para  2   Term  2   Preterm      AB  2   Living  2      SAB  1   IAB      Ectopic  1   Multiple      Live Births               Home Medications    Prior to Admission medications   Medication Sig Start Date End Date Taking? Authorizing Provider  conjugated estrogens (PREMARIN) vaginal cream Place 0.5 g vaginally. 08/07/24  Yes [provider]  acetaminophen  (TYLENOL ) 500 MG tablet Take 500 mg by mouth. 12/21/23   [provider]  Cholecalciferol (VITAMIN D-3 PO) Take by mouth daily.    [provider]  Desloratadine (CLARINEX PO) Take by mouth daily at 2 am.    [provider]  fluconazole  (DIFLUCAN ) 150 MG tablet Take 1 tablet (150 mg total) by mouth daily. Patient taking differently: Take 150 mg by mouth daily. As needed 05/02/24   Mayer, Jodi R, NP  fluticasone  (FLONASE ) 50 MCG/ACT nasal spray Place 1 spray into both nostrils daily. 05/02/24   Mayer, Jodi R, NP  iron  polysaccharides (NIFEREX) 150 MG capsule Take 150 mg by mouth daily. 07/03/24   [provider]  senna (SENOKOT) 8.6 MG TABS tablet Take 1 tablet  (8.6 mg total) by mouth daily. 12/17/23   Theophilus Pagan, MD    Family History Family History  Problem Relation Age of Onset   Other Mother        balance issues from a thing in her brain requires walker   Colon cancer Mother 42       dx 2019   Cancer Father    Heart disease Maternal Grandmother    Diabetes Maternal Grandmother    Rectal cancer Neg Hx    Stomach cancer Neg Hx     Social History Social History   Tobacco Use   Smoking status: Former    Current packs/day: 0.00  Average packs/day: 1 pack/day for 34.0 years (34.0 ttl pk-yrs)    Types: Cigarettes    Start date: 09/03/1981    Quit date: 09/04/2015    Years since quitting: 8.9   Smokeless tobacco: Never   Tobacco comments:    Started age 29 - quit age 73  Vaping Use   Vaping status: Former  Substance Use Topics   Alcohol use: Yes    Alcohol/week: 2.0 standard drinks of alcohol    Types: 2 Standard drinks or equivalent per week    Comment: socially   Drug use: No     Allergies   Latex, Atorvastatin , Norco [hydrocodone -acetaminophen ], and Percocet [oxycodone -acetaminophen ]   Review of Systems Review of Systems  Musculoskeletal:  Positive for arthralgias. Negative for joint swelling.  Skin:  Negative for color change, rash and wound.  Neurological:  Negative for weakness and numbness.     Physical Exam Triage Vital Signs ED Triage Vitals  Encounter Vitals Group     BP 08/10/24 1114 131/87     Girls Systolic BP Percentile --      Girls Diastolic BP Percentile --      Boys Systolic BP Percentile --      Boys Diastolic BP Percentile --      Pulse Rate 08/10/24 1114 86     Resp 08/10/24 1114 18     Temp 08/10/24 1114 98.3 F (36.8 C)     Temp src --      SpO2 08/10/24 1114 99 %     Weight --      Height --      Head Circumference --      Peak Flow --      Pain Score 08/10/24 1115 8     Pain Loc --      Pain Education --      Exclude from Growth Chart --    No data found.  Updated  Vital Signs BP 131/87   Pulse 86   Temp 98.3 F (36.8 C)   Resp 18   LMP 03/02/2012   SpO2 99%   Visual Acuity Right Eye Distance:   Left Eye Distance:   Bilateral Distance:    Right Eye Near:   Left Eye Near:    Bilateral Near:     Physical Exam Constitutional:      General: She is not in acute distress. HENT:     Mouth/Throat:     Mouth: Mucous membranes are moist.  Cardiovascular:     Rate and Rhythm: Normal rate and regular rhythm.  Pulmonary:     Effort: Pulmonary effort is normal. No respiratory distress.  Musculoskeletal:        General: Tenderness present. No swelling or deformity.     Comments: Right arm tenderness from shoulder to elbow, worse around the shoulder.  Limited ROM of shoulder due to discomfort.  Full ROM of elbow, wrist, fingers.  2+ radial pulse.  Sensation intact.  Skin:    Capillary Refill: Capillary refill takes less than 2 seconds.     Findings: No bruising, erythema, lesion or rash.  Neurological:     General: No focal deficit present.     Mental Status: She is alert.     Sensory: No sensory deficit.     Motor: No weakness.      UC Treatments / Results  Labs (all labs ordered are listed, but only abnormal results are displayed) Labs Reviewed - No data to display  EKG  Radiology DG Elbow Complete Right Result Date: 08/10/2024 CLINICAL DATA:  pain s/p fall EXAM: RIGHT ELBOW - COMPLETE 3+ VIEW COMPARISON:  None Available. FINDINGS: No acute fracture or dislocation. Enthesopathic changes of the lateral epicondyle. No area of erosion or osseous destruction. No unexpected radiopaque foreign body. Soft tissues are unremarkable. IMPRESSION: 1. No acute fracture or dislocation. Electronically Signed   By: Corean Salter M.D.   On: 08/10/2024 12:06   DG Shoulder Right Result Date: 08/10/2024 CLINICAL DATA:  pain s/p fall EXAM: RIGHT SHOULDER - 2+ VIEW COMPARISON:  December 14, 2023 FINDINGS: No acute fracture or dislocation.  Mild-to-moderate joint space narrowing and osteophyte formation of the acromioclavicular joint. No area of erosion or osseous destruction. No unexpected radiopaque foreign body. Soft tissues are unremarkable. IMPRESSION: 1. No acute fracture or dislocation. 2. Mild-to-moderate degenerative changes of the acromioclavicular joint. Electronically Signed   By: Corean Salter M.D.   On: 08/10/2024 12:05    Procedures Procedures (including critical care time)  Medications Ordered in UC Medications - No data to display  Initial Impression / Assessment and Plan / UC Course  I have reviewed the triage vital signs and the nursing notes.  Pertinent labs & imaging results that were available during my care of the patient were reviewed by me and considered in my medical decision making (see chart for details).    Right shoulder pain, right elbow pain.  Patient fell a week ago while she was walking in the woods.  She has had pain since then.  X-ray of right shoulder and elbow negative for acute abnormality today.  Treating with sling for comfort, rest, ice packs, Tylenol .  Instructed her to follow-up with an orthopedist.  Contact information for on-call Ortho provided.  Education provided on shoulder pain and joint pain.  She agrees to plan of care.  Final Clinical Impressions(s) / UC Diagnoses   Final diagnoses:  Acute pain of right shoulder  Right elbow pain     Discharge Instructions      The x-rays of your shoulder and elbow do not show any acute abnormality.  Wear the sling as directed for comfort.  Rest your shoulder and arm.  Apply ice packs as directed.  Take Tylenol  as directed.    Follow-up with an orthopedist.     ED Prescriptions   None    PDMP not reviewed this encounter.   Corlis Burnard DEL, NP 08/10/24 [phone]

## 2024-08-12 ENCOUNTER — Ambulatory Visit (INDEPENDENT_AMBULATORY_CARE_PROVIDER_SITE_OTHER): Admitting: Physician Assistant

## 2024-08-12 DIAGNOSIS — M25511 Pain in right shoulder: Secondary | ICD-10-CM

## 2024-08-12 NOTE — Progress Notes (Signed)
 Office Visit Note   Patient: Deanna Scott           Date of Birth: [birth year]           MRN: [id number] Visit Date: 08/12/2024              Requested by: Gretta Ole Birmingham, PA-C [address] [place],  [place] [address] PCP: Gretta Ole Birmingham, PA-C   Assessment & Plan: Visit Diagnoses:  1. Acute pain of right shoulder     Plan: Patient is a pleasant 53 year old woman who is approximately 10 days status post falling while she was out walking in the woods onto her right shoulder.  She did not think much of it at first but in a few days began to have pain and was seen and evaluated in urgent care.  X-rays of both her elbow and her shoulder were taken.  The only positive finding was some arthritis of the [facility] joint.  Based on today's exam findings consistent with contusion/rotator cuff tendinitis.  She is getting better and at first was not able to move her arm but now has fairly good movement.  I do not think she needs an injection today.  Discussed with her doing some range of motion exercises and to discontinue her sling so her shoulder does not get stiff.  Explained exercises to her gave her handout she will follow-up in 3 weeks for reevaluation  Follow-Up Instructions: Return in about 3 weeks (around 09/02/2024).   Orders:  No orders of the defined types were placed in this encounter.  No orders of the defined types were placed in this encounter.     Procedures: No procedures performed   Clinical Data: No additional findings.   Subjective: Chief Complaint  Patient presents with   Right Shoulder - Pain    HPI pleasant 53 year old woman who is 10 days status post falling while out walking in the woods onto her left shoulder and elbow.  3 days after the fall she continued to have pain and was evaluated in urgent care.  X-rays were negative.  She admits she is doing much better today than she has in the last week  Review of Systems  All other systems reviewed and  are negative.    Objective: Vital Signs: LMP 03/02/2012   Physical Exam Constitutional:      Appearance: Normal appearance.  Pulmonary:     Effort: Pulmonary effort is normal.  Skin:    General: Skin is warm and dry.  Neurological:     General: No focal deficit present.     Mental Status: She is alert and oriented to person, place, and time.  Psychiatric:        Mood and Affect: Mood normal.        Behavior: Behavior normal.     Ortho Exam Examination of her arm she can go to about 170 degrees actively then just has some pain but has full forward elevation strength is intact she can internally rotate behind her back again she has some discomfort.  She has good biceps triceps strength no ecchymosis.  Distally she is neurovascular intact no pain with range of motion of her neck.  She has a mild to moderate positive empty can test and speeds test Specialty Comments:  No specialty comments available.  Imaging: No results found.   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   Confusion 12/05/2023   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/04/2023   Acute recurrent frontal sinusitis 09/21/2023   Perimenopause 05/21/2023   Bacterial sinusitis 05/21/2023   Bloody diarrhea 09/16/2022   Right foot pain 08/21/2022   Elevated alkaline phosphatase level 08/11/2022   Angina pectoris 06/08/2022   Environmental allergies 06/08/2022   Prediabetes 10/05/2021   Dyslipidemia 10/05/2021   HTN (hypertension) 09/19/2021   Hyperlipidemia 09/06/2021   BV (bacterial vaginosis) 11/11/2020   Snoring 06/08/2020   Extremity numbness 10/24/2019   Shortness of breath 10/23/2019   Lateral epicondylitis 10/08/2019    Fatigue 10/08/2019   Screening breast examination 10/07/2019   Triceps tendonitis 07/21/2019   Low back pain 06/18/2019   Vaginal wall prolapse 07/29/2017   Migraines 07/27/2017   GERD (gastroesophageal reflux disease) 07/29/2014   Dizziness 02/06/2013   OBESITY, NOS 01/03/2007   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   Qualifier: Diagnosis of  By: Curtis MD, Debby      Family History  Problem Relation Age of Onset   Other Mother        balance issues from a thing in her brain requires walker   Colon cancer Mother 57       dx 2019   Cancer Father    Heart disease Maternal Grandmother    Diabetes Maternal Grandmother    Rectal cancer Neg Hx    Stomach cancer Neg Hx     Past Surgical History:  Procedure Laterality Date   ABDOMINAL HYSTERECTOMY     ARTERIAL LINE INSERTION Right 12/06/2023   Procedure: ARTERIAL LINE INSERTION;  Surgeon: Cherrie Toribio SAUNDERS, MD;  Location: [facility];  Service: Cardiovascular;  Laterality: Right;   BLADDER SUSPENSION  2009   CENTRAL LINE INSERTION Left 12/06/2023   Procedure: CENTRAL LINE INSERTION;  Surgeon: Cherrie Toribio SAUNDERS, MD;  Location: [facility];  Service: Cardiovascular;  Laterality: Left;   CORONARY PRESSURE/FFR STUDY N/A 06/13/2022   Procedure: INTRAVASCULAR PRESSURE WIRE/FFR STUDY;  Surgeon: Claudene Victory ORN, MD;  Location: [facility];  Service: Cardiovascular;  Laterality: N/A;   DIAGNOSTIC LAPAROSCOPY     ectopic pregnancy   DILATION AND CURETTAGE OF UTERUS     hx mab   endoscopy nasal sinus surgery  01/13/2020   [facility] - CFS Leak Repair   ICD  IMPLANT N/A 12/13/2023   Procedure: ICD IMPLANT;  Surgeon: Kennyth Chew, MD;  Location: [facility] INVASIVE CV LAB;  Service: Cardiovascular;  Laterality: N/A;   LEFT HEART CATH AND CORONARY ANGIOGRAPHY N/A 06/13/2022   Procedure: LEFT HEART CATH AND CORONARY ANGIOGRAPHY;  Surgeon: Claudene Victory ORN, MD;  Location: [facility];  Service: Cardiovascular;  Laterality: N/A;   LEFT HEART CATH AND CORONARY ANGIOGRAPHY N/A 12/06/2023   Procedure: LEFT HEART CATH AND CORONARY ANGIOGRAPHY;  Surgeon: Ladona Heinz, MD;  Location: [facility];  Service: Cardiovascular;  Laterality: N/A;   RIGHT HEART CATH N/A 12/06/2023   Procedure: RIGHT HEART CATH;  Surgeon: Cherrie Toribio SAUNDERS, MD;  Location: [facility];  Service: Cardiovascular;  Laterality: N/A;   SACROSPINOUS LIGAMENT FIXATION     posterior repair wtih cysto   SVD     x 2   TEMPORARY PACEMAKER N/A 12/06/2023   Procedure: TEMPORARY PACEMAKER;  Surgeon: Cherrie Toribio SAUNDERS, MD;  Location: [facility];  Service: Cardiovascular;  Laterality: N/A;   TRANSESOPHAGEAL ECHOCARDIOGRAM (CATH LAB) N/A 12/06/2023   Procedure: TRANSESOPHAGEAL ECHOCARDIOGRAM;  Surgeon: Pietro Redell RAMAN, MD;  Location: [facility] INVAS[facility] CV LAB;  Service: Cardiovascular;  Laterality: N/A;   TUBAL LIGATION  1999   VAGINAL HYSTERECTOMY  03/13/2012   Procedure: HYSTERECTOMY VAGINAL;  Surgeon: Winton Felt, MD;  Location: [facility];  Service: Gynecology;  Laterality: N/A;   Social History   Occupational History   Not on file  Tobacco Use   Smoking status: Former    Current packs/day: 0.00    Average packs/day: 1 pack/day for 34.0 years (34.0 ttl pk-yrs)    Types: Cigarettes    Start date: 09/03/1981    Quit date: 09/04/2015    Years since quitting: 8.9   Smokeless tobacco: Never   Tobacco comments:    Started age 28 - quit age 6  Vaping Use   Vaping status: Former  Substance and Sexual Activity   Alcohol use: Yes    Alcohol/week: 2.0 standard drinks of alcohol     Types: 2 Standard drinks or equivalent per week    Comment: socially   Drug use: No   Sexual activity: Yes    Birth control/protection: Surgical    Comment: Hysterectomy

## 2024-09-08 ENCOUNTER — Encounter: Payer: Self-pay | Admitting: Radiology

## 2024-09-12 ENCOUNTER — Ambulatory Visit: Admitting: Physician Assistant

## 2024-10-21 ENCOUNTER — Ambulatory Visit

## 2024-10-21 DIAGNOSIS — I4901 Ventricular fibrillation: Secondary | ICD-10-CM

## 2024-10-22 LAB — CUP PACEART REMOTE DEVICE CHECK
Battery Remaining Longevity: 180 mo
Battery Remaining Percentage: 100 %
Brady Statistic RV Percent Paced: 0 %
Date Time Interrogation Session: 20251216013700
HighPow Impedance: 92 Ohm
Implantable Lead Connection Status: 753985
Implantable Lead Implant Date: 20250206
Implantable Lead Location: 753862
Implantable Lead Model: 672
Implantable Lead Serial Number: 267694
Implantable Pulse Generator Implant Date: 20250206
Lead Channel Impedance Value: 747 Ohm
Lead Channel Pacing Threshold Amplitude: 0.5 V
Lead Channel Pacing Threshold Pulse Width: 0.4 ms
Lead Channel Setting Pacing Amplitude: 3.5 V
Lead Channel Setting Pacing Pulse Width: 0.4 ms
Lead Channel Setting Sensing Sensitivity: 0.5 mV
Pulse Gen Serial Number: 350124

## 2024-10-22 NOTE — Progress Notes (Signed)
 Remote ICD Transmission

## 2024-10-24 ENCOUNTER — Ambulatory Visit: Payer: Self-pay | Admitting: Cardiology

## 2024-11-25 ENCOUNTER — Emergency Department (HOSPITAL_COMMUNITY)

## 2024-11-25 ENCOUNTER — Other Ambulatory Visit: Payer: Self-pay

## 2024-11-25 ENCOUNTER — Encounter (HOSPITAL_COMMUNITY): Payer: Self-pay

## 2024-11-25 ENCOUNTER — Emergency Department (HOSPITAL_COMMUNITY)
Admission: EM | Admit: 2024-11-25 | Discharge: 2024-11-25 | Disposition: A | Discharge status: Home / Self Care | Attending: Emergency Medicine | Admitting: Emergency Medicine

## 2024-11-25 DIAGNOSIS — Z91040 Latex allergy status: Secondary | ICD-10-CM | POA: Insufficient documentation

## 2024-11-25 DIAGNOSIS — R5383 Other fatigue: Secondary | ICD-10-CM | POA: Diagnosis not present

## 2024-11-25 DIAGNOSIS — Z95810 Presence of automatic (implantable) cardiac defibrillator: Secondary | ICD-10-CM | POA: Diagnosis not present

## 2024-11-25 DIAGNOSIS — R42 Dizziness and giddiness: Secondary | ICD-10-CM | POA: Insufficient documentation

## 2024-11-25 DIAGNOSIS — R0602 Shortness of breath: Secondary | ICD-10-CM | POA: Diagnosis not present

## 2024-11-25 DIAGNOSIS — R519 Headache, unspecified: Secondary | ICD-10-CM | POA: Diagnosis not present

## 2024-11-25 LAB — URINALYSIS, ROUTINE W REFLEX MICROSCOPIC
Bilirubin Urine: NEGATIVE
Glucose, UA: NEGATIVE mg/dL
Hgb urine dipstick: NEGATIVE
Ketones, ur: NEGATIVE mg/dL
Leukocytes,Ua: NEGATIVE
Nitrite: NEGATIVE
Protein, ur: NEGATIVE mg/dL
Specific Gravity, Urine: 1.009 (ref 1.005–1.030)
pH: 5 (ref 5.0–8.0)

## 2024-11-25 LAB — COMPREHENSIVE METABOLIC PANEL WITH GFR
ALT: 9 U/L (ref 0–44)
AST: 25 U/L (ref 15–41)
Albumin: 4.5 g/dL (ref 3.5–5.0)
Alkaline Phosphatase: 130 U/L — ABNORMAL HIGH (ref 38–126)
Anion gap: 11 (ref 5–15)
BUN: 19 mg/dL (ref 6–20)
CO2: 24 mmol/L (ref 22–32)
Calcium: 9.9 mg/dL (ref 8.9–10.3)
Chloride: 102 mmol/L (ref 98–111)
Creatinine, Ser: 0.77 mg/dL (ref 0.44–1.00)
GFR, Estimated: >60 mL/min
Glucose, Bld: 139 mg/dL — ABNORMAL HIGH (ref 70–99)
Potassium: 4.5 mmol/L (ref 3.5–5.1)
Sodium: 138 mmol/L (ref 135–145)
Total Bilirubin: 0.5 mg/dL (ref 0.0–1.2)
Total Protein: 8.2 g/dL — ABNORMAL HIGH (ref 6.5–8.1)

## 2024-11-25 LAB — CBC
HCT: 43 % (ref 36.0–46.0)
Hemoglobin: 14.1 g/dL (ref 12.0–15.0)
MCH: 29.8 pg (ref 26.0–34.0)
MCHC: 32.8 g/dL (ref 30.0–36.0)
MCV: 90.9 fL (ref 80.0–100.0)
Platelets: 244 K/uL (ref 150–400)
RBC: 4.73 MIL/uL (ref 3.87–5.11)
RDW: 13.6 % (ref 11.5–15.5)
WBC: 7.2 K/uL (ref 4.0–10.5)
nRBC: 0 % (ref 0.0–0.2)

## 2024-11-25 LAB — CBG MONITORING, ED: Glucose-Capillary: 121 mg/dL — ABNORMAL HIGH (ref 70–99)

## 2024-11-25 LAB — POC OCCULT BLOOD, ED: Fecal Occult Bld: NEGATIVE

## 2024-11-25 MED ORDER — MECLIZINE HCL 25 MG PO TABS
25.0000 mg | ORAL_TABLET | Freq: Once | ORAL | Status: AC
  Administered 2024-11-25: 25 mg via ORAL
  Filled 2024-11-25: qty 1

## 2024-11-25 MED ORDER — PROCHLORPERAZINE EDISYLATE 10 MG/2ML IJ SOLN
10.0000 mg | Freq: Once | INTRAMUSCULAR | Status: AC
  Administered 2024-11-25: 10 mg via INTRAVENOUS
  Filled 2024-11-25: qty 2

## 2024-11-25 MED ORDER — DIPHENHYDRAMINE HCL 50 MG/ML IJ SOLN
12.5000 mg | Freq: Once | INTRAMUSCULAR | Status: DC
  Filled 2024-11-25: qty 1

## 2024-11-25 MED ORDER — DIPHENHYDRAMINE HCL 50 MG/ML IJ SOLN
25.0000 mg | Freq: Once | INTRAMUSCULAR | Status: AC
  Administered 2024-11-25: 25 mg via INTRAVENOUS
  Filled 2024-11-25: qty 1

## 2024-11-25 NOTE — ED Provider Notes (Signed)
 " [facility] AT [facility] Provider Note   CSN: [id number] Arrival date & time: 11/25/24  1047     Patient presents with: No chief complaint on file.   Deanna Scott is a 54 y.o. female with past medical history of prior cardiac arrest, GERD, migraines, who presents emergency department for evaluation of dizziness, shortness of breath and fatigue.  Patient reports that her symptoms have been on and off for a couple of months, but have been worsening.  She also reports frank blood in her stool that has been on and off for the last couple of years.  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  She also reports a history of vertigo, but states today she feels mass is different.  She describes her dizziness as like the room is spinning even when she is not moving.  She endorses occasional headache.  Patient denies any fever, chills, chest pain, abdominal pain, nausea, vomiting or urinary changes.   HPI     Prior to Admission medications  Medication Sig Start Date End Date Taking? Authorizing Provider  acetaminophen  (TYLENOL ) 500 MG tablet Take 500 mg by mouth. 12/21/23   [provider]  Cholecalciferol (VITAMIN D-3 PO) Take by mouth daily.    [provider]  conjugated estrogens (PREMARIN) vaginal cream Place 0.5 g vaginally. 08/07/24   [provider]  Desloratadine (CLARINEX PO) Take by mouth daily at 2 am.    [provider]  fluconazole  (DIFLUCAN ) 150 MG tablet Take 1 tablet (150 mg total) by mouth daily. Patient taking differently: Take 150 mg by mouth daily. As needed 05/02/24   Mayer, Jodi R, NP  fluticasone  (FLONASE ) 50 MCG/ACT nasal spray Place 1 spray into both nostrils daily. 05/02/24   Mayer, Jodi R, NP  iron  polysaccharides (NIFEREX) 150 MG capsule Take 150 mg by mouth daily. 07/03/24   [provider]  senna (SENOKOT)  8.6 MG TABS tablet Take 1 tablet (8.6 mg total) by mouth daily. 12/17/23   Theophilus Pagan, MD    Allergies: Latex, Atorvastatin , Norco [hydrocodone -acetaminophen ], and Percocet [oxycodone -acetaminophen ]    Review of Systems  Neurological:  Positive for dizziness.    Updated Vital Signs BP (!) 131/93 (BP Location: Right Arm)   Pulse 74   Temp 98.1 F (36.7 C) (Oral)   Resp 18   Ht 5' 2 (1.575 m)   Wt 100.4 kg   LMP 03/02/2012   SpO2 97%   BMI 40.49 kg/m   Physical Exam Vitals and nursing note reviewed.  Constitutional:      Appearance: Normal appearance.  HENT:     Head: Normocephalic and atraumatic.     Mouth/Throat:     Mouth: Mucous membranes are moist.  Eyes:     General: No scleral icterus.       Right eye: No discharge.        Left eye: No discharge.     Conjunctiva/sclera: Conjunctivae normal.  Cardiovascular:     Rate and Rhythm: Normal rate and regular rhythm.     Pulses: Normal pulses.  Pulmonary:     Effort: Pulmonary effort is normal.     Breath sounds: Normal breath sounds.  Abdominal:     General: There is no distension.     Palpations: Abdomen is soft.     Tenderness: There is no abdominal tenderness.  Musculoskeletal:  General: No deformity.     Cervical back: Normal range of motion.  Skin:    General: Skin is warm and dry.     Capillary Refill: Capillary refill takes less than 2 seconds.  Neurological:     Mental Status: She is alert.     Motor: No weakness.  Psychiatric:        Mood and Affect: Mood normal.     (all labs ordered are listed, but only abnormal results are displayed) Labs Reviewed  COMPREHENSIVE METABOLIC PANEL WITH GFR - Abnormal; Notable for the following components:      Result Value   Glucose, Bld 139 (*)    Total Protein 8.2 (*)    Alkaline Phosphatase 130 (*)    All other components within normal limits  URINALYSIS, ROUTINE W REFLEX MICROSCOPIC - Abnormal; Notable for the following components:   Bacteria,  UA MANY (*)    All other components within normal limits  CBG MONITORING, ED - Abnormal; Notable for the following components:   Glucose-Capillary 121 (*)    All other components within normal limits  CBC  POC OCCULT BLOOD, ED    EKG: None  Radiology: CT Head Wo Contrast Result Date: 11/25/2024 CLINICAL DATA:  Dizziness. EXAM: CT HEAD WITHOUT CONTRAST TECHNIQUE: Contiguous axial images were obtained from the base of the skull through the vertex without intravenous contrast. RADIATION DOSE REDUCTION: This exam was performed according to the departmental dose-optimization program which includes automated exposure control, adjustment of the mA and/or kV according to patient size and/or use of iterative reconstruction technique. COMPARISON:  December 05, 2023 FINDINGS: Brain: No evidence of acute infarction, hemorrhage, hydrocephalus, extra-axial collection or mass lesion/mass effect. Vascular: No hyperdense vessel or unexpected calcification. Skull: Normal. Negative for fracture or focal lesion. Sinuses/Orbits: No acute finding. Other: None. IMPRESSION: No acute intracranial pathology. Electronically Signed   By: Suzen Dials M.D.   On: 11/25/2024 16:41    Procedures   Medications Ordered in the ED  diphenhydrAMINE  (BENADRYL ) injection 12.5 mg (has no administration in time range)  meclizine  (ANTIVERT ) tablet 25 mg (25 mg Oral Given 11/25/24 1543)  prochlorperazine  (COMPAZINE ) injection 10 mg (10 mg Intravenous Given 11/25/24 1832)  diphenhydrAMINE  (BENADRYL ) injection 25 mg (25 mg Intravenous Given 11/25/24 1831)                                 Medical Decision Making Amount and/or Complexity of Data Reviewed Labs: ordered. Radiology: ordered.  Risk Prescription drug management.   This patient presents to the ED for concern of dizziness, this involves an extensive number of treatment options, and is a complaint that carries with it a high risk of complications and morbidity.   Differential diagnosis includes: Posterior stroke, migraine, TIA, BPPV, vertigo, electrolyte abnormality, symptomatic anemia  Co morbidities:  see above  Lab Tests:  I Ordered, and personally interpreted labs.  The pertinent results include: No acute abnormalities that would explain patient's symptoms  Imaging Studies:  I ordered imaging studies including CT head I independently visualized and interpreted imaging which showed no acute intracranial abnormality  I agree with the radiologist interpretation  Cardiac Monitoring/ECG:  The patient was maintained on a cardiac monitor.  I personally viewed and interpreted the cardiac monitored which showed an underlying rhythm of: Sinus rhythm  Medicines ordered and prescription drug management:  I ordered medication including  Medications  diphenhydrAMINE  (BENADRYL ) injection 12.5 mg (has no  administration in time range)  meclizine  (ANTIVERT ) tablet 25 mg (25 mg Oral Given 11/25/24 1543)  prochlorperazine  (COMPAZINE ) injection 10 mg (10 mg Intravenous Given 11/25/24 1832)  diphenhydrAMINE  (BENADRYL ) injection 25 mg (25 mg Intravenous Given 11/25/24 1831)   for dizziness Reevaluation of the patient after these medicines showed that the patient improved I have reviewed the patients home medicines and have made adjustments as needed  Test Considered:   none  Critical Interventions:   none  Consultations Obtained: None  Problem List / ED Course:     ICD-10-CM   1. Dizziness  R42       MDM: 54 year old female who presents Emergency Department for evaluation of dizziness.  Overall workup was unremarkable.  No abnormality noted on CT scan.  I initially gave patient meclizine , without improvement.  I then gave her a migraine cocktail, which she stated improved her symptoms.  Patient also had a reported blood in her stool, however Hemoccult today was negative.  Hemoglobin stable at 14.1.  Recommendation for patient to follow-up with  neurology for further workup outpatient.  Patient is agreeable to this plan.  Her vital signs are stable.  Patient is appropriate for discharge at this time.   Dispostion:  After consideration of the diagnostic results and the patients response to treatment, I feel that the patient would benefit from outpatient follow-up.    Final diagnoses:  Dizziness    ED Discharge Orders     None          Torrence Marry GORMAN DEVONNA 11/25/24 1909  "

## 2024-11-25 NOTE — ED Notes (Signed)
 PT axox4. GCS 15. Pt and daughter of patient verbalize understanding of discharge instructions and follow up. Pt ambulated out of er with steady gait to transportation home with daughter.

## 2024-11-25 NOTE — Discharge Instructions (Addendum)
 It was a pleasure taking care of you today. You were seen in the Emergency Department for evaluation of this. Your work-up was reassuring. Your CT/Xray/Labs showed no acute abnormality that would explain your symptoms.  I did treat you with a migraine cocktail while you are in the emergency department, which did help.  I am also providing you with a referral to neurology for a further workup.  You will need to call them to schedule. Refer to the attached documentation for further management of your symptoms.   Please return to the ER if you experience chest pain, trouble breathing, intractable nausea/vomiting or any other life threatening illnesses.

## 2024-11-25 NOTE — ED Triage Notes (Signed)
 Pt ambulatory to triage with complaints of dizziness that she describes as feeling like her brain is moving while she is still, and like she may pass out. PT endorses a hx of vertigo, but states that this is different. During triage pt also reports blood in stool, chest pain,and sinus drainage.

## 2024-11-25 NOTE — ED Notes (Signed)
 First poc with patient. Pt sitting up in bed with daughter at bedside. Pt denies complaints, ready for disposition.  No respiratory distress noted.  Denies pain.  Pt refuses second dose of benadryl  at this time.  VS obtained and recorded. Disposition process completed

## 2024-12-05 ENCOUNTER — Other Ambulatory Visit: Payer: Self-pay

## 2024-12-05 ENCOUNTER — Other Ambulatory Visit (HOSPITAL_COMMUNITY): Payer: Self-pay

## 2024-12-05 MED ORDER — MECLIZINE HCL 25 MG PO TABS
25.0000 mg | ORAL_TABLET | Freq: Three times a day (TID) | ORAL | 0 refills | Status: AC
  Filled 2024-12-05: qty 15, 5d supply, fill #0

## 2024-12-05 MED ORDER — POLYSACCHARIDE IRON COMPLEX 150 MG PO CAPS
150.0000 mg | ORAL_CAPSULE | Freq: Every day | ORAL | 3 refills | Status: AC
  Filled 2024-12-05: qty 90, 90d supply, fill #0

## 2025-01-13 ENCOUNTER — Institutional Professional Consult (permissible substitution) (INDEPENDENT_AMBULATORY_CARE_PROVIDER_SITE_OTHER): Admitting: Otolaryngology

## 2025-01-20 ENCOUNTER — Encounter

## 2025-04-21 ENCOUNTER — Encounter

## 2025-07-21 ENCOUNTER — Encounter

## 2025-10-20 ENCOUNTER — Encounter
